# Patient Record
Sex: Male | Born: 1935 | Race: White | Hispanic: No | Smoking: Former smoker
Health system: Southern US, Community
[De-identification: ages and names within clinical notes are randomized; demographics above are authoritative.]

## PROBLEM LIST (undated history)

## (undated) ENCOUNTER — Emergency Department (HOSPITAL_COMMUNITY): Discharge status: Absent without leave | Payer: Medicare Other

## (undated) DIAGNOSIS — E785 Hyperlipidemia, unspecified: Secondary | ICD-10-CM

## (undated) DIAGNOSIS — M069 Rheumatoid arthritis, unspecified: Secondary | ICD-10-CM

## (undated) DIAGNOSIS — I5032 Chronic diastolic (congestive) heart failure: Secondary | ICD-10-CM

## (undated) DIAGNOSIS — K5730 Diverticulosis of large intestine without perforation or abscess without bleeding: Secondary | ICD-10-CM

## (undated) DIAGNOSIS — I872 Venous insufficiency (chronic) (peripheral): Secondary | ICD-10-CM

## (undated) DIAGNOSIS — F3289 Other specified depressive episodes: Secondary | ICD-10-CM

## (undated) DIAGNOSIS — H8110 Benign paroxysmal vertigo, unspecified ear: Secondary | ICD-10-CM

## (undated) DIAGNOSIS — G609 Hereditary and idiopathic neuropathy, unspecified: Secondary | ICD-10-CM

## (undated) DIAGNOSIS — F329 Major depressive disorder, single episode, unspecified: Secondary | ICD-10-CM

## (undated) DIAGNOSIS — J8410 Pulmonary fibrosis, unspecified: Secondary | ICD-10-CM

## (undated) DIAGNOSIS — I10 Essential (primary) hypertension: Secondary | ICD-10-CM

## (undated) DIAGNOSIS — G4730 Sleep apnea, unspecified: Secondary | ICD-10-CM

## (undated) DIAGNOSIS — I4891 Unspecified atrial fibrillation: Secondary | ICD-10-CM

## (undated) DIAGNOSIS — I4892 Unspecified atrial flutter: Secondary | ICD-10-CM

## (undated) DIAGNOSIS — F419 Anxiety disorder, unspecified: Secondary | ICD-10-CM

## (undated) HISTORY — DX: Major depressive disorder, single episode, unspecified: F32.9

## (undated) HISTORY — DX: Essential (primary) hypertension: I10

## (undated) HISTORY — DX: Sleep apnea, unspecified: G47.30

## (undated) HISTORY — PX: VARICOSE VEIN SURGERY: SHX832

## (undated) HISTORY — DX: Unspecified atrial flutter: I48.92

## (undated) HISTORY — DX: Benign paroxysmal vertigo, unspecified ear: H81.10

## (undated) HISTORY — DX: Hereditary and idiopathic neuropathy, unspecified: G60.9

## (undated) HISTORY — DX: Morbid (severe) obesity due to excess calories: E66.01

## (undated) HISTORY — PX: RADIOFREQUENCY ABLATION: SHX2290

## (undated) HISTORY — DX: Chronic diastolic (congestive) heart failure: I50.32

## (undated) HISTORY — PX: COLONOSCOPY W/ BIOPSIES: SHX1374

## (undated) HISTORY — DX: Anxiety disorder, unspecified: F41.9

## (undated) HISTORY — DX: Other specified depressive episodes: F32.89

## (undated) HISTORY — DX: Rheumatoid arthritis, unspecified: M06.9

## (undated) HISTORY — DX: Diverticulosis of large intestine without perforation or abscess without bleeding: K57.30

## (undated) HISTORY — DX: Hyperlipidemia, unspecified: E78.5

## (undated) HISTORY — DX: Venous insufficiency (chronic) (peripheral): I87.2

## (undated) HISTORY — DX: Unspecified atrial fibrillation: I48.91

---

## 1961-01-19 HISTORY — PX: NEPHRECTOMY: SHX65

## 1997-10-08 ENCOUNTER — Encounter: Admission: RE | Admit: 1997-10-08 | Discharge: 1997-10-08 | Payer: Self-pay | Admitting: Hematology and Oncology

## 1998-02-18 ENCOUNTER — Ambulatory Visit (HOSPITAL_COMMUNITY): Admission: RE | Admit: 1998-02-18 | Discharge: 1998-02-18 | Payer: Self-pay | Admitting: Psychiatry

## 1998-04-10 ENCOUNTER — Ambulatory Visit (HOSPITAL_COMMUNITY): Admission: RE | Admit: 1998-04-10 | Discharge: 1998-04-10 | Payer: Self-pay | Admitting: Family Medicine

## 2000-03-09 ENCOUNTER — Emergency Department (HOSPITAL_COMMUNITY): Admission: EM | Admit: 2000-03-09 | Discharge: 2000-03-09 | Payer: Self-pay | Admitting: Emergency Medicine

## 2001-06-20 ENCOUNTER — Encounter: Payer: Self-pay | Admitting: Family Medicine

## 2001-06-20 ENCOUNTER — Encounter: Admission: RE | Admit: 2001-06-20 | Discharge: 2001-06-20 | Payer: Self-pay | Admitting: Family Medicine

## 2006-07-06 ENCOUNTER — Encounter: Admission: RE | Admit: 2006-07-06 | Discharge: 2006-07-06 | Payer: Self-pay | Admitting: Internal Medicine

## 2006-11-02 ENCOUNTER — Encounter: Payer: Self-pay | Admitting: Internal Medicine

## 2008-03-05 ENCOUNTER — Encounter: Payer: Self-pay | Admitting: Internal Medicine

## 2008-03-05 LAB — CONVERTED CEMR LAB
AST: 21 units/L
Albumin: 4 g/dL
Alkaline Phosphatase: 56 units/L
BUN: 14 mg/dL
CO2: 20 meq/L
Calcium: 9.3 mg/dL
Chloride: 104 meq/L

## 2008-03-07 ENCOUNTER — Encounter: Payer: Self-pay | Admitting: Internal Medicine

## 2008-03-07 LAB — CONVERTED CEMR LAB
BUN: 18 mg/dL
CO2: 24 meq/L
Creatinine, Ser: 0.89 mg/dL
Glucose, Bld: 72 mg/dL
Potassium: 5.1 meq/L

## 2008-07-19 ENCOUNTER — Encounter: Admission: RE | Admit: 2008-07-19 | Discharge: 2008-07-19 | Payer: Self-pay | Admitting: Specialist

## 2008-11-20 ENCOUNTER — Encounter: Payer: Self-pay | Admitting: Internal Medicine

## 2008-11-20 LAB — CONVERTED CEMR LAB
ALT: 15 units/L
AST: 11 units/L
CO2: 25 meq/L
Calcium: 0.824 mg/dL
Creatinine, Ser: 0.83 mg/dL
Glucose, Bld: 101 mg/dL
Total Bilirubin: 0.4 mg/dL

## 2008-12-26 ENCOUNTER — Encounter: Payer: Self-pay | Admitting: Internal Medicine

## 2009-04-02 ENCOUNTER — Encounter (INDEPENDENT_AMBULATORY_CARE_PROVIDER_SITE_OTHER): Payer: Self-pay | Admitting: Internal Medicine

## 2009-04-02 ENCOUNTER — Ambulatory Visit: Payer: Self-pay | Admitting: Pulmonary Disease

## 2009-04-02 ENCOUNTER — Inpatient Hospital Stay (HOSPITAL_COMMUNITY): Admission: EM | Admit: 2009-04-02 | Discharge: 2009-04-11 | Payer: Self-pay | Admitting: Emergency Medicine

## 2009-04-02 ENCOUNTER — Ambulatory Visit: Payer: Self-pay | Admitting: Cardiology

## 2009-04-03 ENCOUNTER — Encounter: Payer: Self-pay | Admitting: Internal Medicine

## 2009-04-05 ENCOUNTER — Encounter: Payer: Self-pay | Admitting: Internal Medicine

## 2009-04-07 ENCOUNTER — Encounter: Payer: Self-pay | Admitting: Cardiology

## 2009-04-08 ENCOUNTER — Encounter: Payer: Self-pay | Admitting: Internal Medicine

## 2009-04-08 ENCOUNTER — Encounter: Payer: Self-pay | Admitting: Cardiovascular Disease

## 2009-04-09 LAB — CONVERTED CEMR LAB
AST: 18 units/L
Albumin: 2.8 g/dL
Alkaline Phosphatase: 35 units/L
CO2: 33 meq/L
Calcium: 8.1 mg/dL
Chloride: 102 meq/L
HCT: 33.4 %
Hemoglobin: 11 g/dL
Potassium: 3.7 meq/L
RDW: 16.1 %
TSH: 1.214 microintl units/mL
WBC: 14.3 10*3/uL

## 2009-04-23 ENCOUNTER — Telehealth (INDEPENDENT_AMBULATORY_CARE_PROVIDER_SITE_OTHER): Payer: Self-pay | Admitting: *Deleted

## 2009-05-01 ENCOUNTER — Ambulatory Visit: Payer: Self-pay | Admitting: Internal Medicine

## 2009-05-01 DIAGNOSIS — J84112 Idiopathic pulmonary fibrosis: Secondary | ICD-10-CM

## 2009-05-01 DIAGNOSIS — G4730 Sleep apnea, unspecified: Secondary | ICD-10-CM

## 2009-05-06 ENCOUNTER — Encounter: Payer: Self-pay | Admitting: Internal Medicine

## 2009-05-13 ENCOUNTER — Telehealth (INDEPENDENT_AMBULATORY_CARE_PROVIDER_SITE_OTHER): Payer: Self-pay | Admitting: *Deleted

## 2009-05-14 ENCOUNTER — Ambulatory Visit: Payer: Self-pay | Admitting: Internal Medicine

## 2009-05-30 ENCOUNTER — Telehealth: Payer: Self-pay | Admitting: Internal Medicine

## 2009-06-01 ENCOUNTER — Encounter: Payer: Self-pay | Admitting: Pulmonary Disease

## 2009-06-06 ENCOUNTER — Encounter: Payer: Self-pay | Admitting: Internal Medicine

## 2009-07-11 ENCOUNTER — Encounter: Payer: Self-pay | Admitting: Internal Medicine

## 2009-07-12 ENCOUNTER — Ambulatory Visit: Payer: Self-pay | Admitting: Internal Medicine

## 2009-07-17 ENCOUNTER — Ambulatory Visit: Payer: Self-pay | Admitting: Internal Medicine

## 2009-07-17 DIAGNOSIS — F3289 Other specified depressive episodes: Secondary | ICD-10-CM | POA: Insufficient documentation

## 2009-07-17 DIAGNOSIS — G609 Hereditary and idiopathic neuropathy, unspecified: Secondary | ICD-10-CM

## 2009-07-17 DIAGNOSIS — I872 Venous insufficiency (chronic) (peripheral): Secondary | ICD-10-CM | POA: Insufficient documentation

## 2009-07-17 DIAGNOSIS — F329 Major depressive disorder, single episode, unspecified: Secondary | ICD-10-CM

## 2009-07-17 DIAGNOSIS — M069 Rheumatoid arthritis, unspecified: Secondary | ICD-10-CM

## 2009-07-17 DIAGNOSIS — I10 Essential (primary) hypertension: Secondary | ICD-10-CM | POA: Insufficient documentation

## 2009-07-17 DIAGNOSIS — E785 Hyperlipidemia, unspecified: Secondary | ICD-10-CM

## 2009-07-17 DIAGNOSIS — I4892 Unspecified atrial flutter: Secondary | ICD-10-CM

## 2009-07-17 DIAGNOSIS — I5022 Chronic systolic (congestive) heart failure: Secondary | ICD-10-CM

## 2009-07-17 DIAGNOSIS — J309 Allergic rhinitis, unspecified: Secondary | ICD-10-CM | POA: Insufficient documentation

## 2009-07-18 ENCOUNTER — Encounter: Payer: Self-pay | Admitting: Internal Medicine

## 2009-07-23 ENCOUNTER — Telehealth: Payer: Self-pay | Admitting: Internal Medicine

## 2009-07-23 ENCOUNTER — Ambulatory Visit: Payer: Self-pay | Admitting: Internal Medicine

## 2009-07-25 ENCOUNTER — Encounter: Payer: Self-pay | Admitting: Internal Medicine

## 2009-07-31 ENCOUNTER — Encounter (INDEPENDENT_AMBULATORY_CARE_PROVIDER_SITE_OTHER): Payer: Self-pay | Admitting: *Deleted

## 2009-07-31 ENCOUNTER — Telehealth: Payer: Self-pay | Admitting: Internal Medicine

## 2009-07-31 ENCOUNTER — Encounter: Payer: Self-pay | Admitting: Internal Medicine

## 2009-08-07 ENCOUNTER — Encounter: Payer: Self-pay | Admitting: Internal Medicine

## 2009-08-12 ENCOUNTER — Ambulatory Visit: Payer: Self-pay | Admitting: Pulmonary Disease

## 2009-08-15 ENCOUNTER — Encounter: Payer: Self-pay | Admitting: Internal Medicine

## 2009-08-15 ENCOUNTER — Telehealth (INDEPENDENT_AMBULATORY_CARE_PROVIDER_SITE_OTHER): Payer: Self-pay | Admitting: *Deleted

## 2009-08-16 ENCOUNTER — Ambulatory Visit: Payer: Self-pay | Admitting: Internal Medicine

## 2009-09-06 ENCOUNTER — Encounter (INDEPENDENT_AMBULATORY_CARE_PROVIDER_SITE_OTHER): Payer: Self-pay | Admitting: *Deleted

## 2009-09-06 ENCOUNTER — Ambulatory Visit: Payer: Self-pay | Admitting: Gastroenterology

## 2009-09-06 DIAGNOSIS — R197 Diarrhea, unspecified: Secondary | ICD-10-CM

## 2009-09-06 LAB — CONVERTED CEMR LAB
AST: 16 units/L (ref 0–37)
Albumin: 3.5 g/dL (ref 3.5–5.2)
Basophils Absolute: 0.1 10*3/uL (ref 0.0–0.1)
Creatinine, Ser: 0.8 mg/dL (ref 0.4–1.5)
Eosinophils Relative: 6 % — ABNORMAL HIGH (ref 0.0–5.0)
Ferritin: 261.3 ng/mL (ref 22.0–322.0)
Folate: >20 ng/mL
GFR calc non Af Amer: 94.46 mL/min (ref >60)
Glucose, Bld: 97 mg/dL (ref 70–99)
Iron: 68 ug/dL (ref 42–165)
MCHC: 33.7 g/dL (ref 30.0–36.0)
MCV: 97.3 fL (ref 78.0–100.0)
Monocytes Absolute: 0.9 10*3/uL (ref 0.1–1.0)
Platelets: 311 10*3/uL (ref 150.0–400.0)
RBC: 3.85 M/uL — ABNORMAL LOW (ref 4.22–5.81)
RDW: 14.7 % — ABNORMAL HIGH (ref 11.5–14.6)
Sodium: 141 meq/L (ref 135–145)
Total Bilirubin: 0.4 mg/dL (ref 0.3–1.2)
Vitamin B-12: 554 pg/mL (ref 211–911)

## 2009-09-10 ENCOUNTER — Encounter: Payer: Self-pay | Admitting: Pulmonary Disease

## 2009-09-10 ENCOUNTER — Ambulatory Visit (HOSPITAL_BASED_OUTPATIENT_CLINIC_OR_DEPARTMENT_OTHER): Admission: RE | Admit: 2009-09-10 | Discharge: 2009-09-10 | Payer: Self-pay | Admitting: Pulmonary Disease

## 2009-09-11 ENCOUNTER — Encounter: Payer: Self-pay | Admitting: Internal Medicine

## 2009-09-13 ENCOUNTER — Telehealth: Payer: Self-pay | Admitting: Gastroenterology

## 2009-09-16 ENCOUNTER — Ambulatory Visit: Payer: Self-pay | Admitting: Gastroenterology

## 2009-09-16 LAB — HM COLONOSCOPY

## 2009-09-17 ENCOUNTER — Encounter: Payer: Self-pay | Admitting: Gastroenterology

## 2009-09-18 ENCOUNTER — Encounter: Payer: Self-pay | Admitting: Gastroenterology

## 2009-09-18 ENCOUNTER — Ambulatory Visit: Payer: Self-pay | Admitting: Cardiology

## 2009-09-19 ENCOUNTER — Telehealth: Payer: Self-pay | Admitting: Internal Medicine

## 2009-09-19 ENCOUNTER — Telehealth: Payer: Self-pay | Admitting: Pulmonary Disease

## 2009-09-19 HISTORY — PX: SHOULDER SURGERY: SHX246

## 2009-09-20 ENCOUNTER — Encounter (INDEPENDENT_AMBULATORY_CARE_PROVIDER_SITE_OTHER): Payer: Self-pay | Admitting: Specialist

## 2009-09-20 ENCOUNTER — Observation Stay (HOSPITAL_COMMUNITY): Admission: RE | Admit: 2009-09-20 | Discharge: 2009-09-21 | Payer: Self-pay | Admitting: Specialist

## 2009-09-21 ENCOUNTER — Encounter (INDEPENDENT_AMBULATORY_CARE_PROVIDER_SITE_OTHER): Payer: Self-pay | Admitting: Specialist

## 2009-09-30 ENCOUNTER — Ambulatory Visit: Payer: Self-pay | Admitting: Pulmonary Disease

## 2009-10-02 ENCOUNTER — Encounter: Payer: Self-pay | Admitting: Pulmonary Disease

## 2009-10-08 ENCOUNTER — Encounter: Payer: Self-pay | Admitting: Internal Medicine

## 2009-10-08 ENCOUNTER — Ambulatory Visit: Payer: Self-pay | Admitting: Cardiology

## 2009-10-28 ENCOUNTER — Encounter: Payer: Self-pay | Admitting: Pulmonary Disease

## 2009-11-04 ENCOUNTER — Encounter: Payer: Self-pay | Admitting: Pulmonary Disease

## 2009-11-04 ENCOUNTER — Telehealth: Payer: Self-pay | Admitting: Pulmonary Disease

## 2009-11-06 ENCOUNTER — Encounter: Payer: Self-pay | Admitting: Pulmonary Disease

## 2009-11-07 ENCOUNTER — Encounter: Payer: Self-pay | Admitting: Pulmonary Disease

## 2009-11-11 ENCOUNTER — Ambulatory Visit: Payer: Self-pay | Admitting: Pulmonary Disease

## 2009-11-12 ENCOUNTER — Encounter: Payer: Self-pay | Admitting: Pulmonary Disease

## 2009-11-15 ENCOUNTER — Telehealth: Payer: Self-pay | Admitting: Pulmonary Disease

## 2009-12-03 ENCOUNTER — Ambulatory Visit: Payer: Self-pay | Admitting: Cardiology

## 2009-12-03 ENCOUNTER — Encounter: Payer: Self-pay | Admitting: Internal Medicine

## 2009-12-24 ENCOUNTER — Ambulatory Visit: Payer: Self-pay | Admitting: Cardiology

## 2009-12-30 ENCOUNTER — Ambulatory Visit: Payer: Self-pay | Admitting: Internal Medicine

## 2009-12-30 DIAGNOSIS — H8110 Benign paroxysmal vertigo, unspecified ear: Secondary | ICD-10-CM

## 2009-12-30 DIAGNOSIS — R7309 Other abnormal glucose: Secondary | ICD-10-CM

## 2009-12-31 LAB — CONVERTED CEMR LAB: Hgb A1c MFr Bld: 5.8 % (ref 4.6–6.5)

## 2010-01-08 ENCOUNTER — Encounter: Payer: Self-pay | Admitting: Internal Medicine

## 2010-01-28 ENCOUNTER — Telehealth: Payer: Self-pay | Admitting: Internal Medicine

## 2010-02-03 ENCOUNTER — Ambulatory Visit
Admission: RE | Admit: 2010-02-03 | Discharge: 2010-02-03 | Payer: Self-pay | Source: Home / Self Care | Attending: Internal Medicine | Admitting: Internal Medicine

## 2010-02-03 DIAGNOSIS — M79609 Pain in unspecified limb: Secondary | ICD-10-CM | POA: Insufficient documentation

## 2010-02-04 ENCOUNTER — Telehealth: Payer: Self-pay | Admitting: Internal Medicine

## 2010-02-05 ENCOUNTER — Telehealth (INDEPENDENT_AMBULATORY_CARE_PROVIDER_SITE_OTHER): Payer: Self-pay | Admitting: *Deleted

## 2010-02-19 ENCOUNTER — Encounter: Payer: Self-pay | Admitting: Internal Medicine

## 2010-02-20 NOTE — Progress Notes (Signed)
Summary: REFILL ON VIT B12  Phone Note Call from Patient Call back at Home Phone [phone]   Caller: [facility] Summary of Call: PT WANTS A REFILL ON VIT B12.  PRESCRIBED BY DR SPEAR TO HELP WITH HIS NEROPATHY.  HIS PHARMACY IS [facility] [place].  HIS PHONE NUMBER IS [phone] Initial call taken by: Hilarie Fredrickson,  July 23, 2009 3:49 PM  Follow-up for Phone Call        Okay to refill? Follow-up by: Margaret Pyle, CMA,  July 23, 2009 3:55 PM  Additional Follow-up for Phone Call Additional follow up Details #1::        ok Additional Follow-up by: Newt Lukes MD,  July 23, 2009 4:01 PM    Additional Follow-up for Phone Call Additional follow up Details #2::    Pt informed Follow-up by: Margaret Pyle, CMA,  July 23, 2009 4:10 PM  Prescriptions: VITAMIN B-12 500 MCG TABS (CYANOCOBALAMIN) Take 1 tablet by mouth once a day  #30 x 5   Entered by:   Margaret Pyle, CMA   Authorized by:   Newt Lukes MD   Signed by:   Margaret Pyle, CMA on 07/23/2009   Method used:   [facility]ronically to        [facility] [phone]* (retail)       [address]       [place], [place]  [address]       Ph: [phone] or [phone]       Fax: [phone]   RxID:   [id number]

## 2010-02-20 NOTE — Assessment & Plan Note (Signed)
Summary: sleep apnea/apc   Visit Type:  Initial Consult Primary Provider/Referring Provider:  Newt Lukes MD - PMD. Dr Marchelle Gearing - Pulmonary, Dr Azzie Roup - Rheum, Dr [name] - Cards, Dr. [name] - sleep  CC:  Sleep consult.  History of Present Illness: OV 05/01/2009: 77 year old obese male with Pulmonary Fibrosis,  OSA, RA on leflunomide/pred 10mg  and OSA per hx (poor compliance with CPAP), CHF with poor EF. Was admited mid-March 2011 with A Flutter/Fib and CHF exacerbation and needed ICU stay and amiodarone. CT showed evidence of pulmonary fibrosis. Pulmonary advised cautiin with amio. Thereofre, he is s/p cardioversion and ablation with retun to sinus. Cath 04/06/2009 was normal coronaries. PFTs nml  August 12, 2009 Sleep evaluation He underwent PSG in 2004 & was told that he had severe obstructive sleep apnea . He bought the CPAP from a friend who was not using it. The settings were never changed to his needs ! he has since lost wt from 2655 to 235 lbs. Epworth Sleepiness Score remains 15/24. bedtime is 9p, latency 10 mins, afternon naps +, wakes up 3-4 times to urinate , wakes up at 7A feelig tired. There is no history suggestive of cataplexy, sleep paralysis or parasomnias    Preventive Screening-Counseling & Management  Alcohol-Tobacco     Packs/Day: 0.25     Year Started: 1972     Year Quit: 1975   History of Present Illness: In regard to lots of sleep, still tired  What time do you typically go to bed?(between what hours): 9-10  How long does it take you to fall asleep? 10 mins  How many times during the night do you wake up? to urinate average 3-4  What time do you get out of bed to start your day? average 6 to 7  Do you drive or operate heavy machinery in your occupation? no  How much has your weight changed (up or down) over the past two years? (in pounds): 20 lbs decrease  Have you ever had a sleep study before?  If yes,when and where: yes In [place]  approx 6-7 years ago [facility] on [address])  Do you currently use CPAP ? If so , at what pressure? yes  Do you wear oxygen at any time? If yes, how many liters per minute? no Current Medications (verified): 1)  Multivitamins  Tabs (Multiple Vitamin) .... Take 1 Tablet By Mouth Once A Day 2)  Vitamin C Cr 500 Mg Cr-Tabs (Ascorbic Acid) .... Take 1 Tablet By Mouth Once A Day 3)  Lycopene 10 Mg Caps (Lycopene) .... Take 1 Tablet By Mouth Once A Day 4)  Chromium Picolinate 500 Mcg Tabs (Chromium Picolinate) .... Take 1 Tablet By Mouth Once A Day 5)  Calcium 500 Mg Tabs (Calcium) .... Take 1 Tablet By Mouth Two Times A Day 6)  Urinozinc Prostate Formula .... Take 1 Tablet By Mouth Once A Day 7)  Folic Acid 800 Mcg Tabs (Folic Acid) .... Take 1 Tablet By Mouth Once A Day 8)  Vitamin B-12 500 Mcg Tabs (Cyanocobalamin) .... Take 1 Tablet By Mouth Once A Day 9)  Aspir-Low 81 Mg Tbec (Aspirin) .... Take 1 Tablet By Mouth Once A Day 10)  Furosemide 40 Mg Tabs (Furosemide) .... Take 1 Tablet By Mouth Once A Day 11)  Diltiazem Hcl Er Beads 180 Mg Xr24h-Cap (Diltiazem Hcl Er Beads) .... Take 1 Tablet By Mouth Once A Day 12)  Digoxin 0.25 Mg Tabs (Digoxin) .... Take  1 Tablet By Mouth Once A Day 13)  Arava 10 Mg Tabs (Leflunomide) .... At Bedtime 14)  Prilosec 20 Mg Cpdr (Omeprazole) .... Take 1 Tablet By Mouth Once A Day 15)  Metoprolol Tartrate 25 Mg Tabs (Metoprolol Tartrate) .... 1/2 Tab By Mouth Two Times A Day 16)  Gabapentin 300 Mg Caps (Gabapentin) .... 2 Caps By Mouth At Bedtime 17)  Cpap .... Wear At Bedtime 18)  Mucinex Dm 30-600 Mg Xr12h-Tab (Dextromethorphan-Guaifenesin) .... Take 1-2 Tablets Every 12 Hours As Needed 19)  Gochi Juice .... Drink 1 Oz Once Daily 20)  Thigh High Compression Hose .Marland Kitchen.. 1 Pair - Apply Am, Remove At Bedtime Dx - Venous Insufficiency  Allergies (verified): 1)  ! Methotrexate  Past History:  Past Medical History: Last updated: 07/19/2009 Atrial flutter w/ RVR  03/2009 hosp,  s/p ablation = NSR chronic systolic CHF interstitial lung disease. rheumatoid arthritis. History of morbid obesity. 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      Family History: Last updated: 07-19-09 Mom was deceased at the age of 62, nerve  problems.  Father was deceased at the age of 33 - myocardial infarction  Social History: Last updated: 2009/07/19 single, lives alone He does not smoke.   He said he used to smoke socially only when he drank alcohol.   He currently denies drinking alcohol.   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  His family contact would be his sister, [name], [phone].   Social History: Packs/Day:  0.25  Review of Systems       The patient complains of shortness of breath with activity, productive cough, indigestion, nasal congestion/difficulty breathing through nose, sneezing, itching, hand/feet swelling, joint stiffness or pain, rash, and change in color of mucus.  The patient denies shortness of breath at rest, non-productive cough, coughing up blood, chest pain, irregular heartbeats, acid heartburn, loss of appetite, weight change, abdominal pain, difficulty swallowing, sore throat, tooth/dental problems, headaches, ear ache, anxiety, depression, and fever.    Vital Signs:  Patient profile:   77 year old male Height:      68 inches Weight:      236 pounds BMI:     36.01 O2 Sat:      95 % on Room air Temp:     98.6 degrees F oral Pulse rate:   80 / minute BP sitting:   100 / 60  (left arm) Cuff size:   regular  Vitals Entered By: Zackery Barefoot CMA (August 12, 2009 2:13 PM)  O2 Flow:  Room air CC: Sleep  consult Comments Medications reviewed with patient Verified contact number and pharmacy with patient Zackery Barefoot CMA  August 12, 2009 2:13 PM    Physical Exam  Additional Exam:  Gen. Pleasant, well-nourished, in no distress, normal affect ENT - no lesions, no post nasal drip, class 2 airway Neck: No JVD, no thyromegaly, no carotid bruits Lungs: no use of accessory muscles, no dullness to percussion, clear without rales or rhonchi  Cardiovascular: Rhythm regular, heart sounds  normal, no murmurs or gallops, no peripheral edema Abdomen: soft and non-tender, no hepatosplenomegaly, BS normal. Musculoskeletal: No deformities, no cyanosis or clubbing Neuro:  alert, non focal  Impression & Recommendations:  Problem # 1:  OBSTRUCTIVE SLEEP APNEA (ICD-780.57) The pathophysiology of obstructive sleep apnea, it's cardiovascular consequences and modes of treatment including CPAP were discussed with the patient in great detail.  We will have [facility] review the settings on his machine & provide him with supplies. Given his weight loss, we will proceed with a split study & adjust his cPAP as needed Compliance encouraged, wt loss emphasized, asked to avoid meds with sedative side effects, cautioned against driving when sleepy.  Orders: Est. Patient Level V (86578) Sleep Disorder Referral (Sleep Disorder)  Patient Instructions: 1)  Copy sent to:Dr Leschber 2)  Please schedule a follow-up appointment in 2 weeks after sleep study    Appended Document: sleep apnea/apc cppa 9 cm with med FF mask required on PSG  ? add O2 for persistent REM related desaturation

## 2010-02-20 NOTE — Procedures (Signed)
Summary: Oximetry and Order/[facility]  Oximetry and Order/[facility]   Imported By: Lester [place] 11/21/2009 09:55:44  _____________________________________________________________________  External Attachment:    Type:   Image     Comment:   External Document

## 2010-02-20 NOTE — Progress Notes (Signed)
Summary: Rx req  Phone Note Call from Patient Call back at Home Phone [phone]   Caller: Patient Summary of Call: Pt called requesting Rx for knee-hi expansion hose to [facility]. Initial call taken by: Margaret Pyle, CMA,  January 28, 2010 4:11 PM  Follow-up for Phone Call        ok to generate and i will sign Follow-up by: Newt Lukes MD,  January 28, 2010 4:46 PM    New/Updated Medications: * KNEE-HI EXPANSION HOSE use as directed Prescriptions: KNEE-HI EXPANSION HOSE use as directed  #1 x 0   Entered by:   Margaret Pyle, CMA   Authorized by:   Newt Lukes MD   Signed by:   Margaret Pyle, CMA on 01/29/2010   Method used:   Printed then faxed to ...       [facility]* (retail)       [address]       [place], [place]  [address]       Ph: [phone]       Fax: [phone]   RxID:   [id number]

## 2010-02-20 NOTE — Progress Notes (Signed)
Summary: Ortho referral  Phone Note Call from Patient   Summary of Call: Dr Felicity Coyer, pt was scheduled to see Dr Melvyn Novas 02/06/10 @ 815am, pt said he had just started taking medication and hand was feeling bette. Swelling was going down, he would like to see what the medicine will do.If he need to he will call back for referral. Initial call taken by: Dagoberto Reef,  February 05, 2010 11:27 AM  Follow-up for Phone Call        i would still encourage pt to keep appt as referred for second opinion, but pt desicion noted - thanks Follow-up by: Newt Lukes MD,  February 05, 2010 12:18 PM

## 2010-02-20 NOTE — Procedures (Signed)
Summary: Colonoscopy  Patient: [name] Note: All result statuses are Final unless otherwise noted.  Tests: (1) Colonoscopy (COL)   COL Colonoscopy           DONE     [facility]     [address]     [place], [place]  [address]           COLONOSCOPY PROCEDURE REPORT           PATIENT:  [name], [name]  MR#:  [id number]     BIRTHDATE:  [birth year], 77 yrs. old  GENDER:  male     ENDOSCOPIST:  Vania Rea. Jarold Motto, MD, [facility]     REF. BY:  [name], M.D.     PROCEDURE DATE:  09/16/2009     PROCEDURE:  Colonoscopy with biopsy, Colonoscopy with snare     polypectomy     ASA CLASS:  Class II     INDICATIONS:  Routine Risk Screening, unexplained diarrhea     MEDICATIONS:   Fentanyl 75 mcg IV, Versed 6 mg IV           DESCRIPTION OF PROCEDURE:   After the risks benefits and     alternatives of the procedure were thoroughly explained, informed     consent was obtained.  Digital rectal exam was performed and     revealed no abnormalities.   The LB [id number] endoscope     was introduced through the anus and advanced to the terminal ileum     which was intubated for a short distance, without limitations.     The quality of the prep was excellent, using MoviPrep.  The     instrument was then slowly withdrawn as the colon was fully     examined.     <<PROCEDUREIMAGES>>     FINDINGS:  Mild diverticulosis was found in the sigmoid to     descending colon segments.  A sessile polyp was found. 5mm flat     polyp cold snare excise from ascending colon.  This was otherwise     a normal examination of the colon. random biopsies done.   Retrof     lexed views in the rectum revealed no abnormalities.    The scope     was then withdrawn from the patient and the procedure completed.           COMPLICATIONS:  None     ENDOSCOPIC IMPRESSION:     1) Mild diverticulosis in the sigmoid to descending colon     segments     2) Sessile polyp     3) Otherwise normal examination     R/O MICROSCOPIC/COLLAGENOUS COLITIS.R/O ADENOMA.     RECOMMENDATIONS:     1) Await pathology results     2) Continue current medications     REPEAT EXAM:  No           ______________________________     Vania Rea. Jarold Motto, MD, [facility]           CC:           n.     eSIGNED:   Vania Rea. Patterson at 09/16/2009 03:16 PM           [name], [name], [id number]  Note: An exclamation mark (!) indicates a result that was not dispersed into the flowsheet. Document Creation Date: 09/16/2009 3:18 PM _______________________________________________________________________  (1) Order result status: Final Collection or observation date-time: 09/16/2009 15:10 Requested date-time:  Receipt date-time:  Reported date-time:  Referring Physician:   Ordering Physician: Sheryn Bison [phone]) Specimen Source:  Source: Launa Grill Order Number: [id number] Lab site:

## 2010-02-20 NOTE — Miscellaneous (Signed)
Summary: Orders Update pft charges  Clinical Lists Changes  Orders: Added new Service order of Carbon Monoxide diffusing w/capacity (94720) - Signed Added new Service order of Lung Volumes (94240) - Signed Added new Service order of Spirometry (Pre & Post) (94060) - Signed 

## 2010-02-20 NOTE — Assessment & Plan Note (Signed)
Summary: elev sugar,lightheaded/cd   Vital Signs:  Patient profile:   77 year old male Height:      68 inches (172.72 cm) Weight:      235.4 pounds (107 kg) O2 Sat:      98 % on Room air Temp:     97.9 degrees F (36.61 degrees C) oral Pulse rate:   83 / minute BP sitting:   126 / 72  (left arm) Cuff size:   large  Vitals Entered By: Orlan Leavens RMA (December 30, 2009 2:43 PM)  O2 Flow:  Room air CC: Elevated BS & lighthead Is Patient Diabetic? Yes Did you bring your meter with you today? No Pain Assessment Patient in pain? no        Primary Care Provider:  Newt Lukes MD - PMD. Dr Marchelle Gearing - Pulmonary, Dr Azzie Roup - Rheum, Dr [name] - Cards, Dr. [name] - sleep  CC:  Elevated BS & lighthead.  History of Present Illness: here for f/u  1) pulm fibrosis - idopathic and ?amio component - follows with pulm for same - deneis SOB or DOE at this time - reports compliance with meds - no O2 needs  2) A fib/flutter - hosp with same and exac CHF 03/2009 - s/p cardioversion and ablastion 03/2009 = sinus maintained, off amio due to above - denies palp or edema changes - compliants with meds as rx'd and follows with dr. [place] - initially on pradaxa but no anticoag rec at this time  3) RA - follows with rheum for same - on arava since start of 2011, denies adv effects from same, pain and mobility much improved -   4) OSA - intermittent CPAP use - believes weight loss which has occured since 03/2009 hosp has helped his OSA symptoms so CPAP "not necessary most of the time"  5) chronic neuropathic pain - burning and numbness affects hands and feet, no recent change in symptoms - controlled with gabapentin - needs refill  also concern with elev glc at dr. [place] - random 130s - hx borderline sugar in past no recent steroids for arthritis (RA) or pulm flares overindulg in sweets at night and snacks  also c/o dizziness onset 2 weeks ago - no /ha or ear pain - no tinnitus but  ear fullness x 1 1 week ago worse with any head mvmt, better with still position no hx same, no head trauma or vision change, no fever  Clinical Review Panels:  Diabetes Management   HgBA1C:  5.4 (04/09/2009)   Creatinine:  0.8 (09/06/2009)   Last Pneumovax:  Pneumovax (02/19/2006)  CBC   WBC:  9.2 (09/06/2009)   RBC:  3.85 (09/06/2009)   Hgb:  12.6 (09/06/2009)   Hct:  37.4 (09/06/2009)   Platelets:  311.0 (09/06/2009)   MCV  97.3 (09/06/2009)   MCHC  33.7 (09/06/2009)   RDW  14.7 (09/06/2009)   PMN:  68.9 (09/06/2009)   Lymphs:  15.0 (09/06/2009)   Monos:  9.3 (09/06/2009)   Eosinophils:  6.0 (09/06/2009)   Basophil:  0.8 (09/06/2009)  Complete Metabolic Panel   Glucose:  97 (09/06/2009)   Sodium:  141 (09/06/2009)   Potassium:  4.7 (09/06/2009)   Chloride:  103 (09/06/2009)   CO2:  30 (09/06/2009)   BUN:  16 (09/06/2009)   Creatinine:  0.8 (09/06/2009)   Albumin:  3.5 (09/06/2009)   Total Protein:  6.3 (09/06/2009)   Calcium:  9.1 (09/06/2009)   Total Bili:  0.4 (  09/06/2009)   Alk Phos:  54 (09/06/2009)   SGPT (ALT):  13 (09/06/2009)   SGOT (AST):  16 (09/06/2009)   Current Medications (verified): 1)  Multivitamins  Tabs (Multiple Vitamin) .... Take 1 Tablet By Mouth Once A Day 2)  Vitamin C Cr 500 Mg Cr-Tabs (Ascorbic Acid) .... Take 1 Tablet By Mouth Once A Day 3)  Lycopene 10 Mg Caps (Lycopene) .... Take 1 Tablet By Mouth Once A Day 4)  Chromium Picolinate 500 Mcg Tabs (Chromium Picolinate) .... Take 1 Tablet By Mouth Once A Day 5)  Calcium 500 Mg Tabs (Calcium) .... Take 1 Tablet By Mouth Two Times A Day 6)  Urinozinc Prostate Formula .... Take 1 Tablet By Mouth Once A Day 7)  Folic Acid 800 Mcg Tabs (Folic Acid) .... Take 1 Tablet By Mouth Once A Day 8)  Vitamin B-12 500 Mcg Tabs (Cyanocobalamin) .... Take 1 Tablet By Mouth Once A Day 9)  Aspir-Low 81 Mg Tbec (Aspirin) .... Take 1 Tablet By Mouth Once A Day 10)  Furosemide 40 Mg Tabs (Furosemide) .... Take  1 Tablet By Mouth Once A Day 11)  Diltiazem Hcl Er Beads 180 Mg Xr24h-Cap (Diltiazem Hcl Er Beads) .... Take 1 Tablet By Mouth Once A Day 12)  Digoxin 0.25 Mg Tabs (Digoxin) .... Take 1 Tablet By Mouth Once A Day 13)  Arava 10 Mg Tabs (Leflunomide) .... At Bedtime 14)  Metoprolol Tartrate 25 Mg Tabs (Metoprolol Tartrate) .... 1/2 Tab By Mouth Two Times A Day 15)  Gabapentin 300 Mg Caps (Gabapentin) .... 2 Caps By Mouth At Bedtime 16)  Cpap .... Wear At Bedtime 17)  Mucinex Dm 30-600 Mg Xr12h-Tab (Dextromethorphan-Guaifenesin) .... Take 1-2 Tablets Every 12 Hours As Needed 18)  Gochi Juice .... Drink 1 Oz Once Daily 19)  Thigh High Compression Hose .Marland Kitchen.. 1 Pair - Apply Am, Remove At Bedtime Dx - Venous Insufficiency 20)  Allergy Relief 25 Mg Caps (Diphenhydramine Hcl) .... One Capsule By Mouth As Needed  Allergies (verified): 1)  ! Methotrexate  Past History:  Past Medical History: Atrial flutter w/ RVR 03/2009 hosp,  s/p ablation = NSR chronic systolic CHF interstitial lung disease rheumatoid arthritis History of morbid obesity neuropathic pain chronic left lower extremity edema Allergic rhinitis Depression Hyperlipidemia Hypertension  MD roster: pulm - ramaswamy cards- [place]  rheum Dareen Piano   Family History: Mom was deceased at the age of 40, nerve  problems Father was deceased at the age of 73 - myocardial infarction  Social History: single, lives alone He does not smoke, used to smoke socially when he drank alcohol   He currently denies drinking alcohol.   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  His family contact would be his sister, [name], [phone].   Review of Systems  The patient denies weight loss, chest pain, syncope, headaches, and abdominal pain.    Physical Exam  General:  alert, well-developed, well-nourished, and cooperative to examination.    Lungs:  normal respiratory effort, no intercostal retractions or use of  accessory muscles; normal breath sounds bilaterally - no crackles and no wheezes.    Heart:  normal rate, regular rhythm, no murmur, and no rub. RLE without edema, LLE with chronic 1+ edema (wearing compression hose for same). normal DP pulses and normal cap refill in all 4 extremities    Neurologic:  alert & oriented X3 and cranial nerves II-XII symetrically intact.  strength normal in all  extremities, sensation intact to light touch, and gait normal. speech fluent without dysarthria or aphasia; follows commands with good comprehension.  Psych:  Oriented X3, memory intact for recent and remote, normally interactive, good eye contact, not anxious appearing, not depressed appearing, and not agitated.      Impression & Recommendations:  Problem # 1:  HYPERGLYCEMIA (ICD-790.29)  hx MO s/p weight loss in 2011 - now random hyperglycemia on labs at cards but not dx of DM - check a1c not educated on need for diet changes, less sweets - offered pt to check cng 2-3x/week with home glucometer and pt declines at this time - will insist on same if elev a1c Orders: TLB-A1C / Hgb A1C (Glycohemoglobin) (83036-A1C)  Labs Reviewed: Creat: 0.8 (09/06/2009)     Problem # 2:  BENIGN POSITIONAL VERTIGO (ICD-386.11)  exam benign, hx c/w BPPV trial meclizine His updated medication list for this problem includes:    Allergy Relief 25 Mg Caps (Diphenhydramine hcl) ..... One capsule by mouth as needed    Meclizine Hcl 25 Mg Tabs (Meclizine hcl) .Marland Kitchen... 1/2-1 by mouth every 4 hours as needed for dizzy symptoms  Demonstrated maneuvers to self-treat vertigo. Patient to call to be seen if no improvement in 10-14 days, sooner if worse.   Orders: Prescription Created Electronically [phone])  Problem # 3:  VENOUS INSUFFICIENCY, LEFT LEG (ICD-459.81)  Problem # 4:  RHEUMATOID ARTHRITIS (ICD-714.0)  Examination shows rheumatoid deformity of his hands but no active swollen arthritic joints. He denies NSAID use at  this time.  Problem # 5:  ATRIAL FLUTTER (ICD-427.32) s/p RVR 03/2009 His updated medication list for this problem includes:    Aspir-low 81 Mg Tbec (Aspirin) .Marland Kitchen... Take 1 tablet by mouth once a day    Diltiazem Hcl Er Beads 180 Mg Xr24h-cap (Diltiazem hcl er beads) .Marland Kitchen... Take 1 tablet by mouth once a day    Digoxin 0.25 Mg Tabs (Digoxin) .Marland Kitchen... Take 1 tablet by mouth once a day    Metoprolol Tartrate 25 Mg Tabs (Metoprolol tartrate) .Marland Kitchen... 1/2 tab by mouth two times a day  Problem # 6:  PULMONARY FIBROSIS, INTERSTITIAL (ICD-516.3) stable - folllow with pulm q 6 mo as ongoing  Complete Medication List: 1)  Multivitamins Tabs (Multiple vitamin) .... Take 1 tablet by mouth once a day 2)  Vitamin C Cr 500 Mg Cr-tabs (Ascorbic acid) .... Take 1 tablet by mouth once a day 3)  Lycopene 10 Mg Caps (Lycopene) .... Take 1 tablet by mouth once a day 4)  Chromium Picolinate 500 Mcg Tabs (Chromium picolinate) .... Take 1 tablet by mouth once a day 5)  Calcium 500 Mg Tabs (Calcium) .... Take 1 tablet by mouth two times a day 6)  Urinozinc Prostate Formula  .... Take 1 tablet by mouth once a day 7)  Folic Acid 800 Mcg Tabs (Folic acid) .... Take 1 tablet by mouth once a day 8)  Vitamin B-12 500 Mcg Tabs (Cyanocobalamin) .... Take 1 tablet by mouth once a day 9)  Aspir-low 81 Mg Tbec (Aspirin) .... Take 1 tablet by mouth once a day 10)  Furosemide 40 Mg Tabs (Furosemide) .... Take 1 tablet by mouth once a day 11)  Diltiazem Hcl Er Beads 180 Mg Xr24h-cap (Diltiazem hcl er beads) .... Take 1 tablet by mouth once a day 12)  Digoxin 0.25 Mg Tabs (Digoxin) .... Take 1 tablet by mouth once a day 13)  Arava 10 Mg Tabs (Leflunomide) .... At bedtime 14)  Metoprolol Tartrate 25 Mg Tabs (Metoprolol tartrate) .... 1/2 tab by mouth two times a day 15)  Gabapentin 300 Mg Caps (Gabapentin) .... 2 caps by mouth at bedtime 16)  Cpap  .... Wear at bedtime 17)  Mucinex Dm 30-600 Mg Xr12h-tab (Dextromethorphan-guaifenesin)  .... Take 1-2 tablets every 12 hours as needed 18)  Gochi Juice  .... Drink 1 oz once daily 19)  Thigh High Compression Hose  .Marland Kitchen.. 1 pair - apply am, remove at bedtime dx - venous insufficiency 20)  Allergy Relief 25 Mg Caps (Diphenhydramine hcl) .... One capsule by mouth as needed 21)  Flonase 50 Mcg/act Susp (Fluticasone propionate) .... 2 sprays each nostril every morning 22)  Meclizine Hcl 25 Mg Tabs (Meclizine hcl) .... 1/2-1 by mouth every 4 hours as needed for dizzy symptoms  Patient Instructions: 1)  it was good to see you today. 2)  test(s) ordered today to evaluate for possoible diabetes - your results will be posted on the phone tree for review in 48-72 hours from the time of test completion; call [phone] and enter your 9 digit MRN (listed above on this page, just below your name); if any changes need to be made or there are abnormal results, you will be contacted directly.  3)  reduce the sweets in your diet as we discussed - you need to do this even if you do not have diabetes (yet)! 4)  start nose spray and take meclizine as needed for dizzy and ear fullness symptoms your prescriptions have been electronically submitted to your pharmacy. Please take as directed. Contact our office if you believe you're having problems with the medication(s).  5)  Please schedule a follow-up appointment in 6 months to monitor a1c for possible diabetes, call sooner if problems.  Prescriptions: MECLIZINE HCL 25 MG TABS (MECLIZINE HCL) 1/2-1 by mouth every 4 hours as needed for dizzy symptoms  #30 x 1   Entered and Authorized by:   Newt Lukes MD   Signed by:   Newt Lukes MD on 12/30/2009   Method used:   Electronically to        [facility] [phone]* (retail)       [address]       [place], [place]  [address]       Ph: [phone] or [phone]       Fax: [phone]   RxID:   [id number] FLONASE 50 MCG/ACT SUSP (FLUTICASONE PROPIONATE) 2  sprays each nostril every morning  #1 x 1   Entered and Authorized by:   Newt Lukes MD   Signed by:   Newt Lukes MD on 12/30/2009   Method used:   Electronically to        [facility] [phone]* (retail)       [address]       [place], [place]  [address]       Ph: [phone] or [phone]       Fax: [phone]   RxID:   [id number]    Orders Added: 1)  TLB-A1C / Hgb A1C (Glycohemoglobin) [83036-A1C] 2)  Est. Patient Level IV [06269] 3)  Prescription Created Electronically [phone]

## 2010-02-20 NOTE — Progress Notes (Signed)
Summary: rx  Phone Note Call from Patient Call back at Home Phone [phone]   Caller: Patient Call For: ramaswamy Reason for Call: Talk to Nurse Summary of Call: 1)pt wants to know if he needs to continue his lacix...he only has 2 left.  2) pt also out of Metoprol, does he need to continue this?  He also has 6 other meds he is still taking...wants to know if he should continue? Initial call taken by: Eugene Gavia,  April 23, 2009 3:01 PM  Follow-up for Phone Call        called and spoke with pt.  pt was recently at [facility] in March.  Pt wanted to know what are the next steps for pt regarding taking Lasix and Metoprolol.  After reviewing d/c summary, it looks like pt is being following by Dr. [name] at [facility].  Informed pt to call Dr. [place] to discuss these medications.  Pt verbalized understanding and denied any questions.  Aundra Millet Reynolds LPN  April 23, 8293 4:06 PM

## 2010-02-20 NOTE — Assessment & Plan Note (Signed)
Summary: 1 month/apc   Copy to:  [name], MD Primary Provider/Referring Provider:  Newt Lukes MD - PMD. Dr Marchelle Gearing - Pulmonary, Dr Azzie Roup - Rheum, Dr [name] - Cards, Dr. [name] - sleep  CC:  Follow up visit  and complaiant with cpap averages 8 hrs per night.  History of Present Illness: 77 year old obese male with Pulmonary Fibrosis due to RA, on leflunomide/pred 10mg  and OSA , CHF with poor EF. Was admited mid-March 2011 with A Flutter/Fib and CHF exacerbation and needed ICU stay and amiodarone. CT showed evidence of pulmonary fibrosis. Pulmonary advised cautiin with amio. Thereofre, he is s/p cardioversion and ablation with retun to sinus. Cath 04/06/2009 was normal coronaries.   Ov 08/16/2009: Last visit was in May 2011. r PRe-op pulmonary clearance prior to right shoulder surgery. sTates he is going to have a 'minor' surgery on shoulder cyst. Today, we walked him and he did not desaturate on room air after walking 185 feet x 3 laps  September 30, 2009 1:50 PM  - SLEEP visit Reviewed pSG - moderate obstructive sleep apnea AHI 22/h, lowest desatn 78%  -cpaP 9 cm with med FF mask required on PSG  Got new mask prior to surgery- likes it ?  persistent REM related desaturation on 9 cm related to underlying cardiopulmonary disease  November 11, 2009 12:02 PM  Feels good, compliant with CPAP,likes his new machine & mask, this is helping,  reviewde oNO on CPAP - destauration x 9 mins only over total recording time x 10 h, PFTs showed mild restriction, no obstruction   Current Medications (verified): 1)  Multivitamins  Tabs (Multiple Vitamin) .... Take 1 Tablet By Mouth Once A Day 2)  Vitamin C Cr 500 Mg Cr-Tabs (Ascorbic Acid) .... Take 1 Tablet By Mouth Once A Day 3)  Lycopene 10 Mg Caps (Lycopene) .... Take 1 Tablet By Mouth Once A Day 4)  Chromium Picolinate 500 Mcg Tabs (Chromium Picolinate) .... Take 1 Tablet By Mouth Once A Day 5)  Calcium 500 Mg Tabs (Calcium)  .... Take 1 Tablet By Mouth Two Times A Day 6)  Urinozinc Prostate Formula .... Take 1 Tablet By Mouth Once A Day 7)  Folic Acid 800 Mcg Tabs (Folic Acid) .... Take 1 Tablet By Mouth Once A Day 8)  Vitamin B-12 500 Mcg Tabs (Cyanocobalamin) .... Take 1 Tablet By Mouth Once A Day 9)  Aspir-Low 81 Mg Tbec (Aspirin) .... Take 1 Tablet By Mouth Once A Day 10)  Furosemide 40 Mg Tabs (Furosemide) .... Take 1 Tablet By Mouth Once A Day 11)  Diltiazem Hcl Er Beads 180 Mg Xr24h-Cap (Diltiazem Hcl Er Beads) .... Take 1 Tablet By Mouth Once A Day 12)  Digoxin 0.25 Mg Tabs (Digoxin) .... Take 1 Tablet By Mouth Once A Day 13)  Arava 10 Mg Tabs (Leflunomide) .... At Bedtime 14)  Metoprolol Tartrate 25 Mg Tabs (Metoprolol Tartrate) .... 1/2 Tab By Mouth Two Times A Day 15)  Gabapentin 300 Mg Caps (Gabapentin) .... 2 Caps By Mouth At Bedtime 16)  Cpap .... Wear At Bedtime 17)  Mucinex Dm 30-600 Mg Xr12h-Tab (Dextromethorphan-Guaifenesin) .... Take 1-2 Tablets Every 12 Hours As Needed 18)  Gochi Juice .... Drink 1 Oz Once Daily 19)  Thigh High Compression Hose .Marland Kitchen.. 1 Pair - Apply Am, Remove At Bedtime Dx - Venous Insufficiency 20)  Allergy Relief 25 Mg Caps (Diphenhydramine Hcl) .... One Capsule By Mouth As Needed  Allergies (verified): 1)  !  Methotrexate  Past History:  Past Medical History: Last updated: 07/17/2009 Atrial flutter w/ RVR 03/2009 hosp,  s/p ablation = NSR chronic systolic CHF interstitial lung disease. rheumatoid arthritis. History of morbid obesity. neuropathic pain chronic left lower extremity edema Allergic rhinitis Depression Hyperlipidemia Hypertension  MD roster: pulm - ramaswamy cards- [place] rheum - anderson  Social History: Last updated: 07/17/2009 single, lives alone He does not smoke.   He said he used to smoke socially only when he drank alcohol.   He currently denies drinking alcohol.   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  His  family contact would be his sister, [name], [phone].   Review of Systems  The patient denies anorexia, fever, weight loss, weight gain, vision loss, decreased hearing, hoarseness, chest pain, syncope, dyspnea on exertion, peripheral edema, prolonged cough, headaches, hemoptysis, abdominal pain, melena, hematochezia, severe indigestion/heartburn, hematuria, muscle weakness, suspicious skin lesions, difficulty walking, depression, unusual weight change, abnormal bleeding, enlarged lymph nodes, and angioedema.    Vital Signs:  Patient profile:   77 year old male Height:      68 inches Weight:      233.4 pounds O2 Sat:      98 % on Room air Temp:     98.1 degrees F oral Pulse rate:   65 / minute BP sitting:   104 / 60  (left arm)  Vitals Entered By: Renold Genta RCP, LPN (November 11, 2009 11:57 AM)  O2 Flow:  Room air CC: Follow up visit , complaiant with cpap averages 8 hrs per night Comments Medications reviewed with patient Renold Genta RCP, LPN  November 11, 2009 11:58 AM    Physical Exam  Additional Exam:  Gen. Pleasant, well-nourished, in no distress, normal affect ENT - no lesions, no post nasal drip, class 2 airway Neck: No JVD, no thyromegaly, no carotid bruits Lungs: no use of accessory muscles, no dullness to percussion, clear without rales or rhonchi  Cardiovascular: Rhythm regular, heart sounds  normal, no murmurs or gallops, no peripheral edema Musculoskeletal: No deformities, no cyanosis or clubbing      Impression & Recommendations:  Problem # 1:  OBSTRUCTIVE SLEEP APNEA (ICD-780.57)  ct cpap 9 cm Compliance encouraged, wt loss emphasized, asked to avoid meds with sedative side effects, cautioned against driving when sleepy.  Mild desaturation on cpap may be realted to underlying pulmonary fibrosis - does not need O2 at this time.  Orders: Est. Patient Level III (45409)  Problem # 2:  PULMONARY FIBROSIS, INTERSTITIAL (ICD-516.3)  Reassess in  6 mnths  Orders: Est. Patient Level III (81191)  Patient Instructions: 1)  Copy sent to: dr Felicity Coyer 2)  Please schedule a follow-up appointment in 6 months. 3)  Stay on CPPA machine during sleep. 4)  You do not need oxygen at this time  Appended Document: 1 month/apc very mild desaturation , does not need oxygen ( 4 min < 89%) , ct CPAP use  Appended Document: 1 month/apc Pt informed of RA recs. jwr

## 2010-02-20 NOTE — Assessment & Plan Note (Signed)
Summary: ROV/ MBW   Visit Type:  Follow-up Primary Provider/Referring Provider:  Newt Lukes MD - PMD. Dr Marchelle Gearing - Pulmonary, Dr Azzie Roup - Rheum, Dr [name] - Cards, Dr. [name] - sleep  CC:  Follow up.  Pt also here to discuss PFT results.  Pt states he is still having SOB with exertion.  Occ wheezing.  Prod cough with clear mucus-mainly in the mornings.  .  History of Present Illness: OV 05/01/2009: 77 year old obese male with Pulmonary Fibrosis,  OSA, RA on leflunomide/pred 10mg  and OSA per hx (poor compliance with CPAP), CHF with poor EF. Was admited mid-March 2011 with A Flutter/Fib and CHF exacerbation and needed ICU stay and amiodarone. CT showed evidence of pulmonary fibrosis. Pulmonary advised cautiin with amio. Thereofre, he is s/p cardioversion and ablation with retun to sinus. Cath 04/06/2009 was normal coronaries. Now following up. Dyspnea has improved. Doing ADLs. He did not desaturate with exertion. Semicompliant with CPAP.   Of note, he has a DUFFLE BAG full of medicines. Many are OTC vitamins. HE states he is taking all of them.Sudie Grumbling May 14, 2009 -- Med calendar visit with NP Jeanmarie Plant. Calendar done. He is off amio - stopped by Dr. [place]. Numerous supplements esp oil based stopped.   OV 07/23/2009: Followup Pulmonary fibrosis in RA patient on leflunomide and also amiodarone for A Flutter.  Since last visit he is off amiodaroen (confirmed from Dr. Elvis Coil office) but he does not recollect that. He did not bring his med calendar with him. He states that he is on the meds that is on our med list. However, he is unable to name them. He  denies interim complaints and denies chest pain, orthopnea, hemoptysis, fever, n/v/d, edema, headache. He is complaining of daytime fatigue and excess somnolence. HE is not using CPAP. He wants sleep re-eval  Preventive Screening-Counseling & Management  Alcohol-Tobacco     Smoking Status: quit > 6 months  Current Medications  (verified): 1)  Multivitamins  Tabs (Multiple Vitamin) .... Take 1 Tablet By Mouth Once A Day 2)  Vitamin C Cr 500 Mg Cr-Tabs (Ascorbic Acid) .... Take 1 Tablet By Mouth Once A Day 3)  Lycopene 10 Mg Caps (Lycopene) .... Take 1 Tablet By Mouth Once A Day 4)  Chromium Picolinate 500 Mcg Tabs (Chromium Picolinate) .... Take 1 Tablet By Mouth Once A Day 5)  Calcium 500 Mg Tabs (Calcium) .... Take 1 Tablet By Mouth Two Times A Day 6)  Urinozinc Prostate Formula .... Take 1 Tablet By Mouth Once A Day 7)  Folic Acid 800 Mcg Tabs (Folic Acid) .... Take 1 Tablet By Mouth Once A Day 8)  Vitamin B-12 500 Mcg Tabs (Cyanocobalamin) .... Take 1 Tablet By Mouth Once A Day 9)  Aspir-Low 81 Mg Tbec (Aspirin) .... Take 1 Tablet By Mouth Once A Day 10)  Furosemide 40 Mg Tabs (Furosemide) .... Take 1 Tablet By Mouth Once A Day 11)  Diltiazem Hcl Er Beads 180 Mg Xr24h-Cap (Diltiazem Hcl Er Beads) .... Take 1 Tablet By Mouth Once A Day 12)  Digoxin 0.25 Mg Tabs (Digoxin) .... Take 1 Tablet By Mouth Once A Day 13)  Arava 10 Mg Tabs (Leflunomide) .... At Bedtime 14)  Prilosec 20 Mg Cpdr (Omeprazole) .... Take 1 Tablet By Mouth Once A Day 15)  Metoprolol Tartrate 25 Mg Tabs (Metoprolol Tartrate) .... 1/2 Tab By Mouth Two Times A Day 16)  Gabapentin 300 Mg Caps (Gabapentin) .Marland KitchenMarland KitchenMarland Kitchen  2 Caps By Mouth At Bedtime 17)  Cpap .... Wear At Bedtime 18)  Mucinex Dm 30-600 Mg Xr12h-Tab (Dextromethorphan-Guaifenesin) .... Take 1-2 Tablets Every 12 Hours As Needed 19)  Gochi Juice .... Drink 1 Oz Once Daily 20)  Thigh High Compression Hose .Marland Kitchen.. 1 Pair - Apply Am, Remove At Bedtime Dx - Venous Insufficiency  Allergies (verified): 1)  ! Methotrexate  Past History:  Family History: Last updated: 07-22-09 Mom was deceased at the age of 74, nerve  problems.  Father was deceased at the age of 2 - myocardial infarction  Social History: Last updated: 22-Jul-2009 single, lives alone He does not smoke.   He said he used to smoke  socially only when he drank alcohol.   He currently denies drinking alcohol.   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  His family contact would be his sister, [name], [phone].   Risk Factors: Alcohol Use: 0 (07-22-2009) Exercise: yes (07/22/09)  Risk Factors: Smoking Status: quit > 6 months (07/23/2009)  Past Medical History: Reviewed history from 07/22/2009 and no changes required. Atrial flutter w/ RVR 03/2009 hosp,  s/p ablation = NSR chronic systolic CHF interstitial lung disease. rheumatoid arthritis. History of morbid obesity. neuropathic pain chronic left lower extremity edema Allergic rhinitis Depression Hyperlipidemia Hypertension  MD roster: pulm - Darionna Banke cards- [place] rheum Dareen Piano  Past Surgical History: Reviewed history from 04/30/2009 and no changes required.  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      Family History: Reviewed history from 07/22/09 and no changes required. Mom was deceased at the age of 71, nerve  problems.  Father was deceased at the age of 34 - myocardial infarction  Social History: Reviewed history from 2009-07-22 and no changes required. single, lives alone He does not smoke.   He said he used to smoke socially only when he drank alcohol.   He currently denies drinking alcohol.   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  His family contact would be his sister, [name], [phone]. Smoking Status:  quit > 6 months  Review of Systems       The patient complains of shortness of breath with activity, productive cough, non-productive cough, indigestion, nasal congestion/difficulty breathing through nose, itching, depression, and joint stiffness or pain.  The patient denies shortness of breath at rest, coughing up blood, chest pain, irregular heartbeats, acid heartburn, loss of appetite, weight change,  abdominal pain, difficulty swallowing, sore throat, tooth/dental problems, headaches, sneezing, ear ache, anxiety, hand/feet swelling, rash, change in color of mucus, and fever.    Vital Signs:  Patient profile:   77 year old male Height:      68 inches Weight:      236 pounds BMI:     36.01 O2 Sat:      93 % on Room air Temp:     98.3 degrees F oral Pulse rate:   67 / minute BP sitting:   116 / 64  (right arm) Cuff size:   large  Vitals Entered By: Gweneth Dimitri RN (July 23, 2009 1:59 PM)  O2 Flow:  Room air CC: Follow up.  Pt also here to discuss PFT results.  Pt states he is still having SOB with exertion.  Occ wheezing.  Prod cough with clear mucus-mainly in the mornings.   Comments Medications reviewed with patient  Daytime contact number verified with patient. Gweneth Dimitri RN  July 23, 2009 2:00 PM    Physical Exam  General:  obese.   Head:  normocephalic and atraumatic Eyes:  PERRLA/EOM intact; conjunctiva and sclera clear Ears:  TMs intact and clear with normal canals Nose:  no deformity, discharge, inflammation, or lesions Mouth:  no deformity or lesions Neck:  no masses, thyromegaly, or abnormal cervical nodes Chest Wall:  no deformities noted Lungs:  clear bilaterally to auscultation and percussion Heart:  regular rate and rhythm, S1, S2 without murmurs, rubs, gallops, or clicks Abdomen:  bowel sounds positive; abdomen soft and non-tender without masses, or organomegaly Msk:  no deformity or scoliosis noted with normal posture Pulses:  pulses normal Extremities:  no clubbing, cyanosis, edema, or deformity noted Neurologic:  CN II-XII grossly intact with normal reflexes, coordination, muscle strength and tone Skin:  intact without lesions or rashes Cervical Nodes:  no significant adenopathy Axillary Nodes:  no significant adenopathy Psych:  alert and cooperative; normal mood and affect; normal attention span and concentration   Impression &  Recommendations:  Problem # 1:  PULMONARY FIBROSIS, INTERSTITIAL (ICD-516.3) Assessment Unchanged  Orders: Radiology Referral (Radiology) Est. Patient Level III (16109)  CT findings on 04/03/2009 of UIP/Pulmonary Fibrosis secondary to his RA.  However, he also had CHF/ A flutter at that time. Off amiodarone since April 2011. PFTs 07/12/2009 is normal.  PLAN continue cardio-pulm rehab as ongoing and f/u with pulm as ongoing Followup CT chest in 6 months  Problem # 2:  OBSTRUCTIVE SLEEP APNEA (ICD-780.57) Assessment: Unchanged He is noncompliant with cpap. And he is symptomatic  plan refer to Dr. [name] Orders: Sleep Disorder Referral (Sleep Disorder) Est. Patient Level III (60454)  Patient Instructions: 1)  please see Dr. [name] for sleep issues 2)  I will get hold of your breathing test from our ofice and call you 3)  we are not sure if you are on amiodarone 4)  next time you come here bring your med list with you 5)  My nurse will call Dr. [place] office now to find out if you are on amiodarone 6)  Have CT chest in 6 months - for lung fibrosis 7)  REturn in 6 months after CT chest  Appended Document: ROV/ MBW Pls call patient and let him know that his PFTs are normal. Need to have CT in 6 months. needs to see Dr. [name] for sleep fu and always bring med calendar fior every doctor visit  Appended Document: ROV/ MBW [id number]   Appended Document: ROV/ MBW [email or web address]   Appended Document: ROV/ MBW [email or web address]  Appended Document: ROV/ MBW called and spoke with pt and he is aware that PFT's are normal per MR---he is aware of his appt with Dr. [name] on 7-25 and will keep this appt and he is aware that we will repeat his ct scan in 6 months.  pt voiced his understanding

## 2010-02-20 NOTE — Progress Notes (Signed)
Summary: ONO  Phone Note From Other Clinic   Caller: tammy williams w/ [facility] Call For: alva Summary of Call: caller wants to confirm that the results of the ONO that was faxed to our office on 11/11/09 have been received. also wants to know if there are any new orders re: pressure change/ cpap [phone] Initial call taken by: Tivis Ringer, CNA,  November 15, 2009 2:49 PM  Follow-up for Phone Call        LMTCB-see append to 11-11-09 visit for instructions.Reynaldo Minium CMA  November 15, 2009 3:28 PM   Informed pt of RA recs lmom of Dianna Rossetti to call back if needed. Per RA no changes to be made at this time to pressure. Will sign off on phone note at this time. Zackery Barefoot CMA  November 19, 2009 11:29 AM

## 2010-02-20 NOTE — Letter (Signed)
Summary: [facility]   Imported By: Sherian Rein 08/02/2009 08:28:07  _____________________________________________________________________  External Attachment:    Type:   Image     Comment:   External Document

## 2010-02-20 NOTE — Assessment & Plan Note (Signed)
Summary: LOOSE BMS...EM   History of Present Illness Visit Type: Initial Consult Primary GI MD: Sheryn Bison MD FACP FAGA Primary Gladis Soley: Newt Lukes MD - PMD. Dr Marchelle Gearing - Pulmonary, Dr Azzie Roup - Rheum, Dr [name] - Cards, Dr. [name] - sleep Requesting Zyshawn Bohnenkamp: [name], MD Chief Complaint: Intermittant loose to watery stools x 1 year. Pt states he has some urgency after meals sometimes and having problems digesting nuts, raisins, and tomato peelings. Denies pain, blood in stool. History of Present Illness:   77 year old Caucasian male with sleep apnea, history of recurrent atrial flutter, chronic CHF, interstitial lung disease, rheumatoid arthritis, peripheral neuropathy, hypertension and hyperlipidemia, and chronic neuropathic pain. He is referred for possible colonoscopy exam.  Patient describes one to 2 years of watery diarrhea with urgency and some fecal incontinence see without rectal bleeding, rectal pain, or abdominal pain. He denies any specific food intolerances, anorexia, weight loss, upper gastrointestinal or hepatobiliary complaints. He has not had previous GI workups.  There is no history of nausea vomiting, previous hepatitis or pancreatitis. Patient also has some incontinence of urine. He is on multiple medications that are listed and reviewed in his chart. He has rheumatoid arthritis but denies NSAID use. He apparently was previously on methotrexate and had a reaction of unexplained type. Is on variety of multivitamins including folic acid and B12. He takes a daily aspirin tablet,gabepentin, digoxin, Mucinex, metaprolol, diltiazem, and a variety of vitamin supplements. He apparently has sleep apnea and uses a CPAP machine. 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  He denies abuse of alcohol or cigarettes. He is also had previous vein stripping of his legs and has a chronic peripheral neuropathy in both legs with  edema of his left leg. Has recurrent orthopedic problems and is followed by Dr. Thomasena Edis, and apparently is going to have upcoming right shoulder surgery. He denies any mental status or neurological changes, history of MIs or CVAs.   GI Review of Systems      Denies abdominal pain, acid reflux, belching, bloating, chest pain, dysphagia with liquids, dysphagia with solids, heartburn, loss of appetite, nausea, vomiting, vomiting blood, weight loss, and  weight gain.      Reports change in bowel habits and  diarrhea.     Denies anal fissure, black tarry stools, constipation, diverticulosis, fecal incontinence, heme positive stool, hemorrhoids, irritable bowel syndrome, jaundice, light color stool, liver problems, rectal bleeding, and  rectal pain.    Current Medications (verified): 1)  Multivitamins  Tabs (Multiple Vitamin) .... Take 1 Tablet By Mouth Once A Day 2)  Vitamin C Cr 500 Mg Cr-Tabs (Ascorbic Acid) .... Take 1 Tablet By Mouth Once A Day 3)  Lycopene 10 Mg Caps (Lycopene) .... Take 1 Tablet By Mouth Once A Day 4)  Chromium Picolinate 500 Mcg Tabs (Chromium Picolinate) .... Take 1 Tablet By Mouth Once A Day 5)  Calcium 500 Mg Tabs (Calcium) .... Take 1 Tablet By Mouth Two Times A Day 6)  Urinozinc Prostate Formula .... Take 1 Tablet By Mouth Once A Day 7)  Folic Acid 800 Mcg Tabs (Folic Acid) .... Take 1 Tablet By Mouth Once A Day 8)  Vitamin B-12 500 Mcg Tabs (Cyanocobalamin) .... Take 1 Tablet By Mouth Once A Day 9)  Aspir-Low 81 Mg Tbec (Aspirin) .... Take 1 Tablet By Mouth Once A Day 10)  Furosemide 40 Mg Tabs (Furosemide) .... Take 1 Tablet By Mouth Once A Day 11)  Diltiazem Hcl Er Beads 180 Mg Xr24h-Cap (Diltiazem Hcl Er Beads) .... Take 1 Tablet By Mouth Once A Day 12)  Digoxin 0.25 Mg Tabs (Digoxin) .... Take 1 Tablet By Mouth Once A Day 13)  Arava 10 Mg Tabs (Leflunomide) .... At Bedtime 14)  Metoprolol Tartrate 25 Mg Tabs (Metoprolol Tartrate) .... 1/2 Tab By Mouth Two Times  A Day 15)  Gabapentin 300 Mg Caps (Gabapentin) .... 2 Caps By Mouth At Bedtime 16)  Cpap .... Wear At Bedtime 17)  Mucinex Dm 30-600 Mg Xr12h-Tab (Dextromethorphan-Guaifenesin) .... Take 1-2 Tablets Every 12 Hours As Needed 18)  Gochi Juice .... Drink 1 Oz Once Daily 19)  Thigh High Compression Hose .Marland Kitchen.. 1 Pair - Apply Am, Remove At Bedtime Dx - Venous Insufficiency 20)  Allergy Relief 25 Mg Caps (Diphenhydramine Hcl) .... One Capsule By Mouth As Needed  Allergies (verified): 1)  ! Methotrexate  Past History:  Past medical, surgical, family and social histories (including risk factors) reviewed for relevance to current acute and chronic problems.  Past Medical History: Reviewed history from 07/17/2009 and no changes required. Atrial flutter w/ RVR 03/2009 hosp,  s/p ablation = NSR chronic systolic CHF interstitial lung disease. rheumatoid arthritis. History of morbid obesity. neuropathic pain chronic left lower extremity edema Allergic rhinitis Depression Hyperlipidemia Hypertension  MD roster: pulm - ramaswamy cards- [place] rheum Dareen Piano  Past Surgical History: Reviewed history from 04/30/2009 and no changes required.  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      Family History: Reviewed history from 07/17/2009 and no changes required. Mom was deceased at the age of 69, nerve  problems.  Father was deceased at the age of 20 - myocardial infarction  Social History: Reviewed history from 07/17/2009 and no changes required. single, lives alone He does not smoke.   He said he used to smoke socially only when he drank alcohol.   He currently denies drinking alcohol.   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  His family contact would be his sister, [name], [phone].   Review of Systems  The patient denies allergy/sinus, anemia, anxiety-new, arthritis/joint pain, back pain, blood  in urine, breast changes/lumps, change in vision, confusion, cough, coughing up blood, depression-new, fainting, fatigue, fever, headaches-new, hearing problems, heart murmur, heart rhythm changes, itching, menstrual pain, muscle pains/cramps, night sweats, nosebleeds, pregnancy symptoms, shortness of breath, skin rash, sleeping problems, sore throat, swelling of feet/legs, swollen lymph glands, thirst - excessive , urination - excessive , urination changes/pain, urine leakage, vision changes, and voice change.   General:  Denies fever, chills, sweats, anorexia, fatigue, weakness, malaise, weight loss, and sleep disorder. CV:  Complains of palpitations, dyspnea on exertion, and peripheral edema; denies chest pains, angina, syncope, orthopnea, PND, and claudication. Resp:  Complains of dyspnea with exercise and wheezing; denies dyspnea at rest, cough, sputum, coughing up blood, and pleurisy. GI:  Complains of diarrhea; denies difficulty swallowing, pain on swallowing, nausea, indigestion/heartburn, vomiting, vomiting blood, abdominal pain, jaundice, gas/bloating, constipation, change in bowel habits, bloody BM's, black BMs, and fecal incontinence. GU:  Complains of urinary frequency, nocturnal urination, and urinary incontinence; denies urinary burning, blood in urine, urinary hesitancy, penile discharge, genital sores, decreased libido, and erectile dysfunction. MS:  Complains of joint pain / LOM, joint swelling, joint stiffness, joint deformity, low back pain, muscle weakness, muscle cramps, and leg pain at night; denies muscle atrophy,  leg pain with exertion, and shoulder pain / LOM hand / wrist pain (CTS). Derm:  Denies rash, itching, dry skin, hives, moles, warts, and unhealing ulcers. Neuro:  Complains of weakness, abnormal sensation, and radiculopathy other:; denies paralysis, seizures, syncope, tremors, vertigo, transient blindness, frequent falls, frequent headaches, difficulty walking, headache,  sciatica, restless legs, memory loss, and confusion. Psych:  Denies depression, anxiety, memory loss, suicidal ideation, hallucinations, paranoia, phobia, and confusion. Endo:  Denies cold intolerance, heat intolerance, polydipsia, polyphagia, polyuria, unusual weight change, and hirsutism. Heme:  Complains of bruising; denies bleeding, enlarged lymph nodes, and pagophagia.  Vital Signs:  Patient profile:   77 year old male Height:      68 inches Weight:      2342 pounds BMI:     357.39 Pulse rate:   90 / minute Pulse rhythm:   regular BP sitting:   126 / 72  (right arm) Cuff size:   large  Vitals Entered By: Christie Nottingham CMA Duncan Dull) (September 06, 2009 10:03 AM)  Physical Exam  General:  Well developed, well nourished, no acute distress.healthy appearing and obese.   Head:  Normocephalic and atraumatic. Eyes:  PERRLA, no icterus.exam deferred to patient's ophthalmologist.   Lungs:  Clear throughout to auscultation. Heart:  Regular rate and rhythm; no murmurs, rubs,  or bruits.soft 1/6 systolic ejection murmur noted. Abdomen:  Soft, nontender and nondistended. No masses, hepatosplenomegaly or hernias noted. Normal bowel sounds.no organomegaly, masses or tenderness. Rectal:  Normal exam.hemocult negative.  Good rectal tone and without evidence of fissures or fistulae. No rectal masses or tenderness. Prostate:  .normal size prostate.   Msk:  joint tenderness, decreased ROM, and rhematoid changes.  Large palpable cyst on anterior right shoulder area. Extremities:  bilateral peripheral edema left leg much larger than right but the edema in both legs with pitting. Compression stockings on both legs. Neurologic:  Alert and  oriented x4;  grossly normal neurologically. Skin:  Intact without significant lesions or rashes. Cervical Nodes:  No significant cervical adenopathy. Psych:  Alert and cooperative. Normal mood and affect.   Impression & Recommendations:  Problem # 1:  DIARRHEA  (ICD-787.91) Assessment Unchanged Chronic diarrhea without rectal bleeding, abdominal pain, or systemic symptoms.... rule out underlying inflammatory bowel disease, malabsorption, versus drug effect. Labs are due more to including digoxin level. Stool exam also ordered for culture, O&P, fat stain, and fecal elastase-1 exam. He has not had colonoscopy which also has been scheduled at his convenience after the above labs have been completed and stool exams examined. I suspect he has diverticulosis and may have an element of segmental colitis. Orders: TLB-CBC Platelet - w/Differential (85025-CBCD) TLB-BMP (Basic Metabolic Panel-BMET) (80048-METABOL) TLB-Hepatic/Liver Function Pnl (80076-HEPATIC) TLB-TSH (Thyroid Stimulating Hormone) (84443-TSH) TLB-B12, Serum-Total ONLY (16109-U04) TLB-Ferritin (82728-FER) TLB-Folic Acid (Folate) (82746-FOL) TLB-IBC Pnl (Iron/FE;Transferrin) (83550-IBC) TLB-IgA (Immunoglobulin A) (82784-IGA) T-Beta Carotene (54098-11914) T-Sprue Panel (Celiac Disease Aby Eval) (83516x3/[address]) T-Culture, Stool (87045/87046-70140) T-Stool Fats [place] Stain [phone]) T-Stool for O&P (86578-46962) TLB-Digoxin (Lanoxin) (80162-DIG) T- * Misc. Laboratory test [phone])  Problem # 2:  CHRONIC SYSTOLIC HEART FAILURE (ICD-428.22) Assessment: Improved continue multiple medications per Dr. [name]  Problem # 3:  SCREENING, COLON CANCER (ICD-V76.51) Assessment: Comment Only  Problem # 4:  RHEUMATOID ARTHRITIS (ICD-714.0) Assessment: Improved Examination shows rheumatoid deformity of his hands but no active swollen arthritic joints. He denies NSAID use at this time.  Problem # 5:  VENOUS INSUFFICIENCY, LEFT LEG (ICD-459.81) Assessment: Unchanged  Problem # 6:  PERIPHERAL NEUROPATHY (ICD-356.9) Assessment:  Unchanged labs order to exclude B12 deficiency and other malabsorption problems  Problem # 7:  ATRIAL FLUTTER (ICD-427.32) Assessment: Improved  Problem # 8:   OBSTRUCTIVE SLEEP APNEA (ICD-780.57) Assessment: Unchanged Continue multiple pulmonary medications and CPAP machine usage.  Patient Instructions: 1)  Please go to the basement for lab work. 2)  You are scheduled for a colonoscopy. 3)  The medication list was reviewed and reconciled.  All changed / newly prescribed medications were explained.  A complete medication list was provided to the patient / caregiver. 4)  Copy sent to : Dr. [name], Dr. [name] and Dr. Marchelle Gearing i pulmonary 5)  Please continue current medications.  6)  Colonoscopy and Flexible Sigmoidoscopy brochure given.  7)  Conscious Sedation brochure given.   Appended Document: LOOSE BMS...EM    Clinical Lists Changes  Medications: Added new medication of MOVIPREP 100 GM  SOLR (PEG-KCL-NACL-NASULF-NA ASC-C) As per prep instructions. - Signed Rx of MOVIPREP 100 GM  SOLR (PEG-KCL-NACL-NASULF-NA ASC-C) As per prep instructions.;  #1 x 0;  Signed;  Entered by: Ashok Cordia RN;  Authorized by: Mardella Layman MD [facility];  Method used: Electron[facility] to [facility] [phone]*, [address] [place], [place], [place]  [address], Ph: [phone] or [phone], Fax: [phone] Orders: Added new Test order of Colonoscopy (Colon) - Signed    Prescriptions: MOVIPREP 100 GM  SOLR (PEG-KCL-NACL-NASULF-NA ASC-C) As per prep instructions.  #1 x 0   Entered by:   Ashok Cordia RN   Authorized by:   Mardella Layman MD [facility]   Signed by:   Ashok Cordia RN on 09/06/2009   Method used:   [facility]ronically to        [facility] [phone]* (retail)       [address]       [place], [place]  [address]       Ph: [phone] or [phone]       Fax: [phone]   RxID:   [id number]

## 2010-02-20 NOTE — Cardiovascular Report (Signed)
Summary: [place]  [place]   Imported By: Sherian Rein 08/02/2009 08:25:26  _____________________________________________________________________  External Attachment:    Type:   Image     Comment:   External Document

## 2010-02-20 NOTE — Progress Notes (Signed)
Summary: lmomtcb x2  Phone Note Call from Patient   Caller: Patient Call For: Vasili Fok Summary of Call: Returning phone call. Initial call taken by: Darletta Moll,  July 31, 2009 2:40 PM  Follow-up for Phone Call        lmomtcb Randell Loop [facility]  July 31, 2009 2:48 PM    lmomtcb and explained to pt to ask the ladies to please put him through to triage . Randell Loop [facility]  July 31, 2009 3:04 PM    Additional Follow-up for Phone Call Additional follow up Details #1::        ATC pt's home number-number did not ring-was put directly to pt's voicemail.  LMOMTCB.  Gweneth Dimitri RN  August 01, 2009 3:04 PM     Additional Follow-up for Phone Call Additional follow up Details #2::    called and spoke with pt and per MR---pft's are normal and pt is aware.  has appt with RA on 7-25 and he will keep this appt---he is aware we will repeat his ct scan in 6 months.  pt voiced his understanding. Randell Loop CMA  August 02, 2009 9:09 AM

## 2010-02-20 NOTE — Letter (Signed)
Summary: [facility]   Imported By: Sherian Rein 01/06/2010 09:27:10  _____________________________________________________________________  External Attachment:    Type:   Image     Comment:   External Document

## 2010-02-20 NOTE — Progress Notes (Signed)
Summary: overnight oxemitry/cb  Phone Note From Pharmacy   Caller: [facility] -[phone]-tammy williams Call For: alva  Summary of Call: does pt need another over night oxemitry in 4 weeks Initial call taken by: Lacinda Axon,  November 04, 2009 10:24 AM  Follow-up for Phone Call        Tammy at [facility] states they have an order to do an ONO in weeks after CPAP set up. Sh estates they did one onthe pt when he was set up on CPAP. She wants to know does Dr. [name] want another ONO now, which would be4 weeks after setup. Please advise. Carron Curie CMA  November 04, 2009 11:40 AM   Additional Follow-up for Phone Call Additional follow up Details #1::        yes- deaturations on sleep study hence recheck ONO on CPAP  Order faxed to [facility] to repeat ono on cpap. Alfonso Ramus  November 06, 2009 8:49 AM

## 2010-02-20 NOTE — Assessment & Plan Note (Signed)
Summary: left middle finger swelling and pain-lb   Vital Signs:  Patient profile:   77 year old male Height:      68 inches (172.72 cm) Weight:      234.2 pounds (106.45 kg) O2 Sat:      97 % on Room air Temp:     98.6 degrees F (37.00 degrees C) oral Pulse rate:   89 / minute BP sitting:   127 / 84  (left arm) Cuff size:   large  Vitals Entered By: Orlan Leavens RMA (February 03, 2010 1:20 PM)  O2 Flow:  Room air CC: Middle finger swollen/pain Is Patient Diabetic? No Pain Assessment Patient in pain? yes     Location: Middle finger Type: aching   Primary Care Provider:  Newt Lukes MD - PMD. Dr Marchelle Gearing - Pulmonary, Dr Azzie Roup - Rheum, Dr [name] - Cards, Dr. [name] - sleep  CC:  Middle finger swollen/pain.  History of Present Illness: here for middle finger swelling and pain, right hand onset 11pm yesterday (24h) ? 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 [place] - initially on pradaxa but no anticoag rec at this time  3) RA - follows with rheum for same - on arava since start of 2011, denies adv effects from same, pain and mobility much improved - intermit pred use  4) OSA - intermittent CPAP use - believes weight loss which has occured since 03/2009 hosp has helped his OSA symptoms so CPAP "not necessary most  of the time"  5) chronic neuropathic pain - burning and numbness affects hands and feet, no recent change in symptoms - controlled with gabapentin -  also concern with elev glc at dr. Swaz[place]and - random 130s - hx borderline sugar in past tries to avoid steroids for arthritis (RA) or pulm flares admits to overindulg in sweets at night and snacks   Current Medications (verified): 1)  Multivitamins  Tabs (Multiple Vitamin) .... Take 1 Tablet By Mouth Once A Day 2)  Vitamin C Cr 500 Mg Cr-Tabs (Ascorbic Acid) .... Take 1 Tablet By Mouth Once A Day 3)  Lycopene 10 Mg Caps (Lycopene) .... Take 1 Tablet By Mouth Once A Day 4)  Chromium Picolinate 500 Mcg Tabs (Chromium Picolinate) .... Take 1 Tablet By Mouth Once A Day 5)  Calcium 500 Mg Tabs (Calcium) .... Take 1 Tablet By Mouth Two Times A Day 6)  Urinozinc Prostate Formula .... Take 1 Tablet By Mouth Once A Day 7)  Folic Acid 800 Mcg Tabs (Folic Acid) .... Take 1 Tablet By Mouth Once A Day 8)  Vitamin B-12 500 Mcg Tabs (Cyanocobalamin) .... Take 1 Tablet By Mouth Once A Day 9)  Aspir-Low 81 Mg Tbec (Aspirin) .... Take 1 Tablet By Mouth Once A Day  10)  Furosemide 40 Mg Tabs (Furosemide) .... Take 1 Tablet By Mouth Once A Day 11)  Diltiazem Hcl Er Beads 180 Mg Xr24h-Cap (Diltiazem Hcl Er Beads) .... Take 1 Tablet By Mouth Once A Day 12)  Digoxin 0.25 Mg Tabs (Digoxin) .... Take 1 Tablet By Mouth Once A Day 13)  Arava 10 Mg Tabs (Leflunomide) .... At Bedtime 14)  Metoprolol Tartrate 25 Mg Tabs (Metoprolol Tartrate) .... 1/2 Tab By Mouth Two Times A Day 15)  Gabapentin 300 Mg Caps (Gabapentin) .... 2 Caps By Mouth At Bedtime 16)  Cpap .... Wear At Bedtime 17)  Mucinex Dm 30-600 Mg Xr12h-Tab (Dextromethorphan-Guaifenesin) .... Take 1-2 Tablets Every 12 Hours As Needed 18)  Gochi Juice .... Drink 1 Oz Once Daily 19)  Thigh High Compression Hose .Marland Kitchen.. 1 Pair - Apply Am, Remove At Bedtime Dx - Venous Insufficiency 20)  Allergy Relief 25 Mg Caps  (Diphenhydramine Hcl) .... One Capsule By Mouth As Needed 21)  Flonase 50 Mcg/act Susp (Fluticasone Propionate) .... 2 Sprays Each Nostril Every Morning 22)  Meclizine Hcl 25 Mg Tabs (Meclizine Hcl) .... 1/2-1 By Mouth Every 4 Hours As Needed For Dizzy Symptoms  Allergies (verified): 1)  ! Methotrexate  Past History:  Past Medical History: Atrial flutter w/ RVR 03/2009 hosp,  s/p ablation = NSR chronic systolic CHF  interstitial lung disease rheumatoid arthritis History of morbid obesity neuropathic pain chronic left lower extremity edema Allergic rhinitis Depression  Hyperlipidemia Hypertension  MD roster: pulm - ramaswamy cards- Sw[place]and  rheum - anderson  Review of Systems  The patient denies anorexia, fever, headaches, and muscle weakness.    Physical Exam  General:  alert, well-developed, well-nourished, and cooperative to examination.   nontoxic Lungs:  normal respiratory effort, no intercostal retractions or use of accessory muscles; normal breath sounds bilaterally - no crackles and no wheezes.    Heart:  normal rate, regular rhythm, no murmur, and no rub. RLE without edema, LLE with chronic 1+ edema (wearing compression hose for same). normal DP pulses and normal cap refill in all 4 extremities    Msk:  diffuse swelling of right middle finger on flexor surface, esp between MCP and PIP, no joint synovitis - neurovasc intact, limited flexion of hand due to swelling and pain, mod tender to palp over base of 3rd finger flexor/palm surface   Impression & Recommendations:  Problem # 1:  FINGER PAIN (ICD-729.5) acute swelling of 3rd right finger, no trauma recalled but pt concerned with fx - check xray now onset in setting of self directed pred wean - ?rheum flare though no other joints affected cont current dose pred w/o further wean unless directed by rheum - pt agrees to same will tx with emperic doxy gven pt concern for "spider bite" though little exam o rhx evidence  to suggest infx Orders: T-Hand Right 3 views (73130TC) Prescription Created Electronically [phone]) Orthopedic Surgeon Referral (Ortho Surgeon)  xray reviewed by me - no bony damage but will arrange for eval by hand specialist to exclude tendon or pulley rupture given acute onset  Problem # 2:  RHEUMATOID ARTHRITIS (ICD-714.0)  Examination shows rheumatoid deformity of his hands but no active swollen arthritic joints. cont tx as ongoing with rheum, no further pred reduction at this time  Complete Medication List: 1)  Multivitamins Tabs (Multiple vitamin) .... Take 1 tablet by mouth once a day 2)  Vitamin C Cr 500 Mg Cr-tabs (Ascorbic acid) .... Take 1 tablet by mouth once a  day 3)  Lycopene 10 Mg Caps (Lycopene) .... Take 1 tablet by mouth once a day 4)  Chromium Picolinate 500 Mcg Tabs (Chromium picolinate) .... Take 1 tablet by mouth once a day 5)  Calcium 500 Mg Tabs (Calcium) .... Take 1 tablet by mouth two times a day 6)  Urinozinc Prostate Formula  .... Take 1 tablet by mouth once a day 7)  Folic Acid 800 Mcg Tabs (Folic acid) .... Take 1 tablet by mouth once a day 8)  Vitamin B-12 500 Mcg Tabs (Cyanocobalamin) .... Take 1 tablet by mouth once a day 9)  Aspir-low 81 Mg Tbec (Aspirin) .... Take 1 tablet by mouth once a day 10)  Furosemide 40 Mg Tabs (Furosemide) .... Take 1 tablet by mouth once a day 11)  Diltiazem Hcl Er Beads 180 Mg Xr24h-cap (Diltiazem hcl er beads) .... Take 1 tablet by mouth once a day 12)  Digoxin 0.25 Mg Tabs (Digoxin) .... Take 1 tablet by mouth once a day 13)  Arava 10 Mg Tabs (Leflunomide) .... At bedtime 14)  Metoprolol Tartrate 25 Mg Tabs (Metoprolol tartrate) .... 1/2 tab by mouth two times a day 15)  Gabapentin 300 Mg Caps (Gabapentin) .... 2 caps by mouth at bedtime 16)  Cpap  .... Wear at bedtime 17)  Mucinex Dm 30-600 Mg Xr12h-tab (Dextromethorphan-guaifenesin) .... Take 1-2 tablets every 12 hours as needed 18)  Gochi Juice  .... Drink 1 oz once  daily 19)  Thigh High Compression Hose  .Marland Kitchen.. 1 pair - apply am, remove at bedtime dx - venous insufficiency 20)  Allergy Relief 25 Mg Caps (Diphenhydramine hcl) .... One capsule by mouth as needed 21)  Flonase 50 Mcg/act Susp (Fluticasone propionate) .... 2 sprays each nostril every morning 22)  Meclizine Hcl 25 Mg Tabs (Meclizine hcl) .... 1/2-1 by mouth every 4 hours as needed for dizzy symptoms 23)  Doxycycline Hyclate 100 Mg Caps (Doxycycline hyclate) .Marland Kitchen.. 1 by mouth two times a day x 7 days  Patient Instructions: 1)  it was good to see you today. 2)  xray ordered today - your results will be called to you after review 3)  doxycycline antibioitcs - your prescriptions have been electronically submitted to your pharmacy. Please take as directed. Contact our office if you believe you're having problems with the medication(s).  4)  no other changes in your medications - do not taper down on prednisione until you see dr. Dareen Piano 5)  keep scheduled appointment to monitor a1c for possible diabetes, call sooner if problems.  Prescriptions: DOXYCYCLINE HYCLATE 100 MG CAPS (DOXYCYCLINE HYCLATE) 1 by mouth two times a day x 7 days  #14 x 0   Entered and Authorized by:   Newt Lukes MD   Signed by:   Newt Lukes MD on 02/03/2010   Method used:   Electronically to        [facility]* (retail)       [address]       [place], [place]  [address]       Ph: [phone]       Fax: [phone]   RxID:   [id number]    Orders Added: 1)  T-Hand Right 3 views [73130TC] 2)  Est. Patient Level IV [95638] 3)  Prescription Created Electronically [[id number]] 4)  Orthopedic Surgeon Referral [Ortho Surgeon]

## 2010-02-20 NOTE — Letter (Signed)
Summary: Surgical Clearance/[facility]  Surgical Clearance/[facility]   Imported By: Sherian Rein 08/21/2009 08:55:33  _____________________________________________________________________  External Attachment:    Type:   Image     Comment:   External Document

## 2010-02-20 NOTE — Progress Notes (Signed)
Summary: talk to dr  Phone Note Call from Patient   Caller: Patient Call For: ramaswamy Summary of Call: need to talk to dr Marchelle Gearing about surgery he is having tomorrow. Initial call taken by: Rickard Patience,  September 19, 2009 2:00 PM  Follow-up for Phone Call        attempted to call pt at his home number listed----[email or web address] for pt to return my call. Randell Loop CMA  September 19, 2009 2:26 PM   Additional Follow-up for Phone Call Additional follow up Details #1::        pt called me back and he stated that the original surgery for his shoulder has been changed now and now they are only going to remove the cyst on his shoulder this time.  he wanted to make sure the MR was aware of this.  please advise. thanks Randell Loop CMA  September 19, 2009 2:40 PM     Additional Follow-up for Phone Call Additional follow up Details #2::    spoke to patient. He is now scheduled for cyst removal in shoulder. Just an FYI call. Surgery will be in [place] per patient. Advised no change in recommendations. He is understanding Follow-up by: Kalman Shan MD,  September 19, 2009 3:13 PM

## 2010-02-20 NOTE — Letter (Signed)
Summary: Patient Notice- Colon Biospy Results  [facility]  [address] [place], [place] [address]   Phone: [phone]  Fax: [phone]        September 18, 2009 MRN: [id number]    KELLEN DUTCH [address] [place], [place]  [address]    Dear Mr. OHM,  I am pleased to inform you that the biopsies taken during your recent colonoscopy did not show any evidence of cancer upon pathologic examination.  Additional information/recommendations:  __No further action is needed at this time.  Please follow-up with      your primary care physician for your other healthcare needs.  X__Please call [phone] to schedule a return visit to review      your condition.BIOPSIES ARE NORMAL.  __Continue with the treatment plan as outlined on the day of your      exam.  __You should have a repeat colonoscopy examination for this problem           in _ years.  Please call us if you are having persistent problems or have questions about your condition that have not been fully answered at this time.  Sincerely,  Mardella Layman MD [facility]   This letter has been electronically signed by your physician.  Appended Document: Patient Notice- Colon Biospy Results letter mailed 9.2.11

## 2010-02-20 NOTE — Miscellaneous (Signed)
Summary: Order/[facility]  Order/[facility]   Imported By: Lester Toombs 10/15/2009 08:22:25  _____________________________________________________________________  External Attachment:    Type:   Image     Comment:   External Document

## 2010-02-20 NOTE — Miscellaneous (Signed)
  Clinical Lists Changes  pt called to say he still has lip swelling despite medrol and benedryl.  I have told him to go immediately to an er or urgent care for treatment..  he is not sure he will do this.

## 2010-02-20 NOTE — Procedures (Signed)
Summary: [facility]   Imported By: Lester Brundidge 11/26/2009 10:36:44  _____________________________________________________________________  External Attachment:    Type:   Image     Comment:   External Document

## 2010-02-20 NOTE — Miscellaneous (Signed)
Summary: Cardiac Rehab/[facility]  Cardiac Rehab/[facility]   Imported By: Sherian Rein 08/14/2009 10:06:21  _____________________________________________________________________  External Attachment:    Type:   Image     Comment:   External Document

## 2010-02-20 NOTE — Letter (Signed)
Summary: CCM Note/[facility]  CCM Note/[facility]   Imported By: Sherian Rein 05/06/2009 10:29:40  _____________________________________________________________________  External Attachment:    Type:   Image     Comment:   External Document

## 2010-02-20 NOTE — Progress Notes (Signed)
Summary: called for xray results, will call back at 1pm   Phone Note Call from Patient Call back at Home Phone [phone]   Caller: Patient Call For: Newt Lukes MD Summary of Call: Pt called, pt requests results of xray of finger yesterday, pt will call back at 1pm or afte to talk to triage. Pt lost phone is using somebody else's phone. Initial call taken by: Verdell Face,  February 04, 2010 12:12 PM  Follow-up for Phone Call        Pt advised of lab results and Rx was called into [facility] as requested by pt Follow-up by: Margaret Pyle, CMA,  February 04, 2010 2:55 PM    Prescriptions: DOXYCYCLINE HYCLATE 100 MG CAPS (DOXYCYCLINE HYCLATE) 1 by mouth two times a day x 7 days  #14 x 0   Entered and Authorized by:   Margaret Pyle, CMA   Signed by:   Margaret Pyle, CMA on 02/04/2010   Method used:   [facility] ...       [facility]  [address] [id number]       [address]       [place], [place]  [address]       Ph: [phone] or [phone]       Fax: [phone]   RxID:   [id number]

## 2010-02-20 NOTE — Letter (Signed)
Summary: [facility]  [facility]   Imported By: Lester Commerce 01/16/2010 11:12:37  _____________________________________________________________________  External Attachment:    Type:   Image     Comment:   External Document

## 2010-02-20 NOTE — Progress Notes (Signed)
Summary: speak to nurse   Phone Note Call from Patient Call back at Home Phone [phone]   Caller: Patient Call For: Dr Jarold Motto Reason for Call: Talk to Nurse Summary of Call: Patient wants to speak to nurse regarding his prep  Initial call taken by: Tawni Levy,  September 13, 2009 12:41 PM  Follow-up for Phone Call        Pt would lilke rx for movi prep to be sent to [facility] in [facility],  He was told it would be less expensive here.  Rx sent as requested.  Pt had numerous questions re prep insturctions.  Went over instructions at length with pt.  States he understands. Follow-up by: Ashok Cordia RN,  September 13, 2009 2:43 PM    Prescriptions: MOVIPREP 100 GM  SOLR (PEG-KCL-NACL-NASULF-NA ASC-C) As per prep instructions.  #1 x 0   Entered by:   Ashok Cordia RN   Authorized by:   Mardella Layman MD [facility]   Signed by:   Ashok Cordia RN on 09/13/2009   Method used:   Electronically to        [facility]  [address] (retail)       [address]       [place], [place]  [address]       Ph: [phone]       Fax: [phone]   RxID:   [id number]

## 2010-02-20 NOTE — Letter (Signed)
Summary: Patient Notice- Polyp Results  [facility]  [address] [place], [place] [address]   Phone: [phone]  Fax: [phone]        September 18, 2009 MRN: [id number]    [name] [address] [place], [place]  [address]    Dear [name],  I am pleased to inform you that the colon polyp(s) removed during your recent colonoscopy was (were) found to be benign (no cancer detected) upon pathologic examination.  I recommend you have a repeat colonoscopy examination in _ years to look for recurrent polyps, as having colon polyps increases your risk for having recurrent polyps or even colon cancer in the future.FOLLOW ONLY AS NEEDED. Should you develop new or worsening symptoms of abdominal pain, bowel habit changes or bleeding from the rectum or bowels, please schedule an evaluation with either your primary care physician or with me.  Additional information/recommendations:  __ No further action with gastroenterology is needed at this time. Please      follow-up with your primary care physician for your other healthcare      needs.  __ Please call [phone] to schedule a return visit to review your      situation.  _X_ Please keep your follow-up visit as already scheduled.  __ Continue treatment plan as outlined the day of your exam.  Please call us if you are having persistent problems or have questions about your condition that have not been fully answered at this time.  Sincerely,  Mardella Layman MD [facility]  This letter has been electronically signed by your physician.  Appended Document: Patient Notice- Polyp Results letter mailed 9.2.11

## 2010-02-20 NOTE — Progress Notes (Signed)
Summary: clearance   -- awaiting fax   Phone Note From Other Clinic   Caller: dr Thomasena Edis -[phone] -wendy Call For: [facility] Summary of Call: still needing clearance on pt for shoulder surgery fax [phone] Initial call taken by: Lacinda Axon,  August 15, 2009 8:57 AM  Follow-up for Phone Call        Spoke with Toniann Fail.  Per Toniann Fail, Dr. Thomasena Edis needs to do a right shoulder open cyst incision.  This will be done under general anesthia.  Needs surgery clearance from MR.  She is refaxing the form to be filled out by MR to triage.  Will forward message to him to address today when he is in the office.  Gweneth Dimitri RN  August 15, 2009 9:17 AM  Faxed Received -- will give to MR when he is in office today.  Gweneth Dimitri RN  August 15, 2009 9:48 AM   Additional Follow-up for Phone Call Additional follow up Details #1::        Per MR, pt needs OV this week for surgery clearance.  Gweneth Dimitri RN  August 15, 2009 1:51 PM  Spoke with pt, informed him of this.  OV scheduled for August 16, 2009 at 210pm with MR -- pt aware.    Called Dr. Thomasena Edis office.  Spoke with Toniann Fail.  Informed her pt will see MR tomorrow for surgery clearance.  She verbalized understanding.    Surgical Clearance Form left with Mindy, who will be working with MR tomorrow, to be placed with pt at time of OV.    Additional Follow-up by: Gweneth Dimitri RN,  August 15, 2009 1:55 PM     Appended Document: clearance   -- awaiting fax  Surgery Clearance form completed by MR and faxed to [facility] at [phone].  Called Dr. Thomasena Edis office, spoke with Toniann Fail.  Advised her of this.  She verbalized understanding.  Will place form in MR's scan folder.

## 2010-02-20 NOTE — Assessment & Plan Note (Signed)
Summary: med cal/mh   Primary Provider/Referring Provider:  [name], [facility], Dr Marchelle Gearing - Pulmonary, Dr. Azzie Roup - Rheum, Dr. Swaziland - Cardiology  CC:  Med Calendar.  History of Present Illness: OV 05/01/2009: 77 year old obese male with OSA, RA on leflunomide/pred 10mg  and OSA per hx (poor compliance with CPAP), CHF with poor EF. Was admited mid-March 2011 with A Flutter/Fib and CHF exacerbation and needed ICU stay and amiodarone. CT showed evidence of pulmonary fibrosis. Pulmonary advised cautiin with amio. Thereofre, he is s/p cardioversion and ablation with retun to sinus. Cath 04/06/2009 was normal coronaries. Now following up. Dyspnea has improved. Doing ADLs. He did not desaturate with exertion. Semicompliant with CPAP.   Of note, he has a DUFFLE BAG full of medicines. Many are OTC vitamins. HE states he is taking all of them.Marland Kitchen  May 14, 2009 --Presents for follow up and med review. He has a duffle bag of meds. He has over 20 nutritional supplements along w/ multiple prescription meds. He has many meds from rehab center and needs refills on 3 meds. He is in the process of changing PCP doctors. We reviewed all his meds and organized them into a med calendar. We stopped several of his supplements including the oil based meds. Denies chest pain, orthopnea, hemoptysis, fever, n/v/d, edema, headache. he is now off Amiodarone -stopped by Dr. Swaziland.   Medications Prior to Update: 1)  Furosemide 40 Mg Tabs (Furosemide) .... Take 1 Tablet By Mouth Two Times A Day 2)  Digoxin 0.25 Mg Tabs (Digoxin) .... Take 1 Tablet By Mouth Once A Day 3)  Amiodarone Hcl 200 Mg Tabs (Amiodarone Hcl) .... Take 1 Tablet By Mouth Once A Day 4)  Pradaxa 150 Mg Caps (Dabigatran Etexilate Mesylate) .... Take 1 Tablet By Mouth Two Times A Day 5)  Diltiazem Hcl Er Beads 180 Mg Xr24h-Cap (Diltiazem Hcl Er Beads) .... Take 1 Tablet By Mouth Once A Day 6)  Prilosec 20 Mg Cpdr (Omeprazole) ....  Take 1 Tablet By Mouth Once A Day 7)  Multivitamins  Tabs (Multiple Vitamin) .... Take 1 Tablet By Mouth Once A Day 8)  Aspir-Low 81 Mg Tbec (Aspirin) .... Take 1 Tablet By Mouth Once A Day 9)  Ginkoba 40 Mg Tabs (Ginkgo Biloba) .... Take 1 Tablet By Mouth Once A Day 10)  Chromium Picolinate 500 Mcg Tabs (Chromium Picolinate) .... Take 1 Tablet By Mouth Once A Day 11)  Vitamin C Cr 500 Mg Cr-Tabs (Ascorbic Acid) .... Take 1 Tablet By Mouth Once A Day 12)  Sm Coral Calcium 1000 (390 Ca) Mg Tabs (Coral Calcium) .... Take 1 Tablet By Mouth Once A Day 13)  Urinozinc Prostate Formula .... Take 1 Tablet By Mouth Once A Day 14)  Omega-3 350 Mg Caps (Omega-3 Fatty Acids) .... Take 1 Tablet By Mouth Once A Day 15)  Cvs Vitamin D 2000 Unit Caps (Cholecalciferol) .... Take 1 Tablet By Mouth Once A Day 16)  Vitamin E 400 Unit Caps (Vitamin E) .... Take 1 Tablet By Mouth Once A Day 17)  Lycopene 10 Mg Caps (Lycopene) .... Take 1 Tablet By Mouth Once A Day 18)  Folic Acid 800 Mcg Tabs (Folic Acid) .... Take 1 Tablet By Mouth Once A Day 19)  Glucosamine-Chondroitin 1500-1200 Mg/27ml Liqd (Glucosamine-Chondroitin) .... Take 1 Tablet By Mouth Two Times A Day 20)  Vitamin B-12 500 Mcg Tabs (Cyanocobalamin) .... Take 1 Tablet By Mouth Once A Day 21)  Gochi Dietary  Supplement .... As Directed 22)  Arava 10 Mg Tabs (Leflunomide) .... At Bedtime 23)  Neurontin 600 Mg Tabs (Gabapentin) .... Take 1 Tab By Mouth At Bedtime 24)  Prednisone 10 Mg Tabs (Prednisone) .... Take 1 Tablet By Mouth Once A Day 25)  Lopressor 50 Mg Tabs (Metoprolol Tartrate) .... 1/2 Tab Daily  Allergies (verified): 1)  ! Methotrexate  Past History:  Past Medical History: Last updated: 09-May-2009 Atrial flutter with rapid ventricular response, status post     ablation, currently heart rate controlled on amiodarone. 2. Pulmonary edema due to acute decompensation of systolic congestive     heart failure. 3. Transient hematuria from  heparin. 4. Respiratory distress due to pulmonary edema. 5. Escherichia coli urinary tract infection. 6. History of interstitial lung disease. 7. History of rheumatoid arthritis. 8 .History of morbid obesity. 9. History of neuropathic pain. 10. History of chronic left lower extremity edema.    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      Family History: Last updated: 05/09/09 Mom was deceased at the age of 60 and had nerve   problems.   Father was deceased at the age of 1 with a history of   myocardial infarction that was massive.   Social History: Last updated: 2009-05-09 He does not smoke.  He said he used to smoke socially   only when he drank alcohol.  He currently denies drinking alcohol.  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  His family contact would be his sister, [name], [phone].   Review of Systems      See HPI  Vital Signs:  Patient profile:   77 year old male Weight:      241.13 pounds O2 Sat:      95 % on Room air Temp:     97.6 degrees F oral Pulse rate:   88 / minute BP sitting:   170 / 90  (left arm) Cuff size:   regular  Vitals Entered By: Boone Master CNA (May 14, 2009 10:58 AM)  O2 Flow:  Room air  Physical Exam  Additional Exam:  GEN: A/Ox3; pleasant , NAD HEENT:  [place]/AT, , EACs-clear, TMs-wnl, NOSE-clear, THROAT-clear NECK:  Supple w/ fair ROM; no JVD; normal carotid impulses w/o bruits; no thyromegaly or nodules palpated; no lymphadenopathy. RESP  Coarse BS w/ no wheeizng  CARD:  RRR, no m/r/g   GI:   Soft & nt; nml bowel sounds; no organomegaly or masses detected. Musco: Warm bil,  no calf tenderness edema, clubbing, pulses intact Neuro: intact w/ no focal deficits noted.    Impression & Recommendations:  Problem # 1:  PULMONARY FIBROSIS, INTERSTITIAL (ICD-516.3)  CT findings on 04/03/2009 of  UIP/Pulmonary Fibrosis secondary to his RA. However, he also had CHF at that time. currently on 05/01/2009   Awaiting pulmoanry rehab.  now off amiodarone.  follow up Dr. Marchelle Gearing in 4 w/ PFTs  Meds reviewed with pt education and computerized med calendar completed- avoid oil based meds for now.     Orders: Est. Patient Level III (45409) Prescription Created Electronically [phone])  Complete Medication List: 1)  Furosemide 40 Mg Tabs (Furosemide) .... Take 1 tablet by mouth two times a day 2)  Digoxin 0.25 Mg Tabs (Digoxin) .... Take 1 tablet by mouth once a day 3)  Amiodarone Hcl 200 Mg Tabs (Amiodarone  hcl) .... Take 1 tablet by mouth once a day 4)  Pradaxa 150 Mg Caps (Dabigatran etexilate mesylate) .... Take 1 tablet by mouth two times a day 5)  Diltiazem Hcl Er Beads 180 Mg Xr24h-cap (Diltiazem hcl er beads) .... Take 1 tablet by mouth once a day 6)  Prilosec 20 Mg Cpdr (Omeprazole) .... Take 1 tablet by mouth once a day 7)  Multivitamins Tabs (Multiple vitamin) .... Take 1 tablet by mouth once a day 8)  Aspir-low 81 Mg Tbec (Aspirin) .... Take 1 tablet by mouth once a day 9)  Ginkoba 40 Mg Tabs (Ginkgo biloba) .... Take 1 tablet by mouth once a day 10)  Chromium Picolinate 500 Mcg Tabs (Chromium picolinate) .... Take 1 tablet by mouth once a day 11)  Vitamin C Cr 500 Mg Cr-tabs (Ascorbic acid) .... Take 1 tablet by mouth once a day 12)  Sm Coral Calcium 1000 (390 Ca) Mg Tabs (Coral calcium) .... Take 1 tablet by mouth once a day 13)  Urinozinc Prostate Formula  .... Take 1 tablet by mouth once a day 14)  Omega-3 350 Mg Caps (Omega-3 fatty acids) .... Take 1 tablet by mouth once a day 15)  Cvs Vitamin D 2000 Unit Caps (Cholecalciferol) .... Take 1 tablet by mouth once a day 16)  Vitamin E 400 Unit Caps (Vitamin e) .... Take 1 tablet by mouth once a day 17)  Lycopene 10 Mg Caps (Lycopene) .... Take 1 tablet by mouth once a day 18)  Folic Acid 800 Mcg Tabs (Folic acid) .... Take 1  tablet by mouth once a day 19)  Glucosamine-chondroitin 1500-1200 Mg/9ml Liqd (Glucosamine-chondroitin) .... Take 1 tablet by mouth two times a day 20)  Vitamin B-12 500 Mcg Tabs (Cyanocobalamin) .... Take 1 tablet by mouth once a day 21)  Gochi Dietary Supplement  .... As directed 22)  Arava 10 Mg Tabs (Leflunomide) .... At bedtime 23)  Neurontin 600 Mg Tabs (Gabapentin) .... Take 1 tab by mouth at bedtime 24)  Prednisone 10 Mg Tabs (Prednisone) .... Take 1 tablet by mouth once a day 25)  Lopressor 50 Mg Tabs (Metoprolol tartrate) .... 1/2 tab daily  Patient Instructions: 1)  Stop oil based meds, vit E, fish oil etc.  2)  Follow med calendar closely and bring to each visit.  3)  I have sent a 1 month refill on Diltiazem, Digoxin, and Prilosec, you need to establish with a primary care doctor if you are not goint back to Dr. Collins Scotland. One suggestion is [facility] on first floor.  4)  follow up Dr. Marchelle Gearing in 4 weeks w/ PFTs  5)  Please contact office for sooner follow up if symptoms do not improve or worsen  Prescriptions: PRILOSEC 20 MG CPDR (OMEPRAZOLE) Take 1 tablet by mouth once a day  #30 x 11   Entered and Authorized by:   Rubye Oaks NP   Signed by:   Rubye Oaks NP on 05/14/2009   Method used:   [facility] to        [facility] [phone][facility] (retail)       [address]       [place], [place]  [address]       Ph: [phone] or [phone]       Fax: [phone]   RxID:   [id number] DILTIAZEM HCL ER BEADS 180 MG XR24H-CAP (DILTIAZEM HCL ER BEADS) Take 1 tablet by  mouth once a day  #30 x 0   Entered and Authorized by:   Rubye Oaks NP   Signed by:   Rubye Oaks NP on 05/14/2009   Method used:   [facility] to        [facility] [phone]* (retail)       [address]       [place], [place]  [address]       Ph: [phone] or [phone]       Fax: [phone]   RxID:    [id number] DIGOXIN 0.25 MG TABS (DIGOXIN) Take 1 tablet by mouth once a day  #30 x 0   Entered and Authorized by:   Rubye Oaks NP   Signed by:   Rubye Oaks NP on 05/14/2009   Method used:   [facility]ically to        [facility] [phone]* (retail)       [address]       [place], [place]  [address]       Ph: [phone] or [phone]       Fax: [phone]   RxID:   [id number]   Appended Document: med calendar update    Clinical Lists Changes  Medications: Added new medication of CALCIUM 500 MG TABS (CALCIUM) Take 1 tablet by mouth two times a day - Signed Changed medication from OMEGA-3 350 MG CAPS (OMEGA-3 FATTY ACIDS) Take 1 tablet by mouth once a day to FOLIC ACID 800 MCG TABS (FOLIC ACID) Take 1 tablet by mouth once a day - Signed Changed medication from FUROSEMIDE 40 MG TABS (FUROSEMIDE) Take 1 tablet by mouth two times a day to FUROSEMIDE 40 MG TABS (FUROSEMIDE) Take 1 tablet by mouth once a day - Signed Removed medication of FOLIC ACID 800 MCG TABS (FOLIC ACID) Take 1 tablet by mouth once a day - Signed Changed medication from PREDNISONE 10 MG TABS (PREDNISONE) Take 1 tablet by mouth once a day to PREDNISONE 5 MG TABS (PREDNISONE) Take 1 tablet by mouth once a day - Signed Changed medication from LOPRESSOR 50 MG TABS (METOPROLOL TARTRATE) 1/2 tab daily to METOPROLOL TARTRATE 25 MG TABS (METOPROLOL TARTRATE) 1/2 tab by mouth two times a day - Signed Changed medication from NEURONTIN 600 MG TABS (GABAPENTIN) Take 1 tab by mouth at bedtime to GABAPENTIN 300 MG CAPS (GABAPENTIN) 2 caps by mouth at bedtime - Signed Added new medication of * CPAP wear at bedtime - Signed Added new medication of MUCINEX DM 30-600 MG XR12H-TAB (DEXTROMETHORPHAN-GUAIFENESIN) Take 1-2 tablets every 12 hours as needed - Signed Removed medication of AMIODARONE HCL 200 MG TABS (AMIODARONE HCL) Take 1 tablet by mouth once a day - Signed Removed medication of  PRADAXA 150 MG CAPS (DABIGATRAN ETEXILATE MESYLATE) Take 1 tablet by mouth two times a day - Signed Removed medication of GINKOBA 40 MG TABS (GINKGO BILOBA) Take 1 tablet by mouth once a day - Signed Removed medication of SM CORAL CALCIUM 1000 (390 CA) MG TABS (CORAL CALCIUM) Take 1 tablet by mouth once a day - Signed Removed medication of [facility] VITAMIN D 2000 UNIT CAPS (CHOLECALCIFEROL) Take 1 tablet by mouth once a day - Signed Removed medication of VITAMIN E 400 UNIT CAPS (VITAMIN E) Take 1 tablet by mouth once a day - Signed Removed medication of GLUCOSAMINE-CHONDROITIN 1500-1200 MG/30ML LIQD (GLUCOSAMINE-CHONDROITIN) Take 1 tablet by  mouth two times a day - Signed Removed medication of * GOCHI DIETARY SUPPLEMENT as directed - Signed

## 2010-02-20 NOTE — Assessment & Plan Note (Signed)
Summary: rov/discuss sleep results/apc   Visit Type:  Follow-up Copy to:  [name], MD Primary Nirvan Laban/Referring Belva Koziel:  Newt Lukes MD - PMD. Dr Marchelle Gearing - Pulmonary, Dr Azzie Roup - Rheum, Dr [name] - Cards, Dr. [name] - sleep  CC:  Pt here for follow up on sleep study. Pt states likes the full mask. Pt state Dr. Donnie Aho started him on a new medication that is causing him to "just feel awful".  History of Present Illness: 77 year old obese male with Pulmonary Fibrosis due to RA,  RA on leflunomide/pred 10mg  and OSA per hx (poor compliance with CPAP), CHF with poor EF. Was admited mid-March 2011 with A Flutter/Fib and CHF exacerbation and needed ICU stay and amiodarone. CT showed evidence of pulmonary fibrosis. Pulmonary advised cautiin with amio. Thereofre, he is s/p cardioversion and ablation with retun to sinus. Cath 04/06/2009 was normal coronaries.   Ov 08/16/2009: Last visit was in May 2011. Our plan at that time was do to CT chest in 6 months as surveillance for his RA related pulmonary fibrosis. Since then he saw Dr. [name] for sleep and has gotten compliant with CPAP. He is attending rehab where their progress notes state he is doing real well without desaturation. He is now here for PRe-op pulmonary clearance prior to right shoulder surgery. sTates he is going to have a 'minor' surgery on shoulder cyst. Does not know what type of anesthesia. Denies decompensation in pulmonary status. SOme baseline minimal dry cough. Today, we walked him and he did not desaturate on room air after walking 185 feet x 3 laps  September 30, 2009 1:50 PM  - SLEEP visit Reviewed pSG - moderate obstructive sleep apnea AHI 22/h, lowest desatn 78%  -cpaP 9 cm with med FF mask required on PSG  Got new mask prior to surgery- likes it ?  persistent REM related desaturation on 9 cm related to underlying cardiopulmonary disease   Preventive Screening-Counseling &  Management  Alcohol-Tobacco     Alcohol drinks/day: 0     Smoking Status: quit > 6 months     Packs/Day: 1/2PPD     Year Started: 1972     Year Quit: 1975     Pack years: 2  Current Medications (verified): 1)  Multivitamins  Tabs (Multiple Vitamin) .... Take 1 Tablet By Mouth Once A Day 2)  Vitamin C Cr 500 Mg Cr-Tabs (Ascorbic Acid) .... Take 1 Tablet By Mouth Once A Day 3)  Lycopene 10 Mg Caps (Lycopene) .... Take 1 Tablet By Mouth Once A Day 4)  Chromium Picolinate 500 Mcg Tabs (Chromium Picolinate) .... Take 1 Tablet By Mouth Once A Day 5)  Calcium 500 Mg Tabs (Calcium) .... Take 1 Tablet By Mouth Two Times A Day 6)  Urinozinc Prostate Formula .... Take 1 Tablet By Mouth Once A Day 7)  Folic Acid 800 Mcg Tabs (Folic Acid) .... Take 1 Tablet By Mouth Once A Day 8)  Vitamin B-12 500 Mcg Tabs (Cyanocobalamin) .... Take 1 Tablet By Mouth Once A Day 9)  Aspir-Low 81 Mg Tbec (Aspirin) .... Take 1 Tablet By Mouth Once A Day 10)  Furosemide 40 Mg Tabs (Furosemide) .... Take 1 Tablet By Mouth Once A Day 11)  Diltiazem Hcl Er Beads 180 Mg Xr24h-Cap (Diltiazem Hcl Er Beads) .... Take 1 Tablet By Mouth Once A Day 12)  Digoxin 0.25 Mg Tabs (Digoxin) .... Take 1 Tablet By Mouth Once A Day 13)  Arava 10 Mg  Tabs (Leflunomide) .... At Bedtime 14)  Metoprolol Tartrate 25 Mg Tabs (Metoprolol Tartrate) .... 1/2 Tab By Mouth Two Times A Day 15)  Gabapentin 300 Mg Caps (Gabapentin) .... 2 Caps By Mouth At Bedtime 16)  Cpap .... Wear At Bedtime 17)  Mucinex Dm 30-600 Mg Xr12h-Tab (Dextromethorphan-Guaifenesin) .... Take 1-2 Tablets Every 12 Hours As Needed 18)  Gochi Juice .... Drink 1 Oz Once Daily 19)  Thigh High Compression Hose .Marland Kitchen.. 1 Pair - Apply Am, Remove At Bedtime Dx - Venous Insufficiency 20)  Allergy Relief 25 Mg Caps (Diphenhydramine Hcl) .... One Capsule By Mouth As Needed 21)  Pradaxa 150 Mg Caps (Dabigatran Etexilate Mesylate) .... Take 1 Tablet By Mouth Two Times A Day  Allergies  (verified): 1)  ! Methotrexate  Past History:  Past Medical History: Last updated: 07/17/2009 Atrial flutter w/ RVR 03/2009 hosp,  s/p ablation = NSR chronic systolic CHF interstitial lung disease. rheumatoid arthritis. History of morbid obesity. neuropathic pain chronic left lower extremity edema Allergic rhinitis Depression Hyperlipidemia Hypertension  MD roster: pulm - ramaswamy cards- Sw[place] rheum - anderson  Social History: Last updated: 07/17/2009 single, lives alone He does not smoke.   He said he used to smoke socially only when he drank alcohol.   He currently denies drinking alcohol.   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  His family contact would be his sister, [name], [phone].   Past Pulmonary History:  Pulmonary History: August 12, 2009 Sleep evaluation He underwent PSG in 2004 & was told that he had severe obstructive sleep apnea . He bought the CPAP from a friend who was not using it. The settings were never changed to his needs ! he has since lost wt from 265 to 235 lbs. Epworth Sleepiness Score remains 15/24. bedtime is 9p, latency 10 mins, afternon naps +, wakes up 3-4 times to urinate , wakes up at 7A feelig tired. There is no history suggestive of cataplexy, sleep paralysis or parasomnias   Review of Systems       The patient complains of dyspnea on exertion.  The patient denies anorexia, fever, weight loss, weight gain, vision loss, decreased hearing, hoarseness, chest pain, syncope, peripheral edema, prolonged cough, headaches, hemoptysis, abdominal pain, melena, hematochezia, severe indigestion/heartburn, hematuria, muscle weakness, difficulty walking, depression, unusual weight change, abnormal bleeding, and enlarged lymph nodes.    Vital Signs:  Patient profile:   77 year old male Height:      68 inches Weight:      233.50 pounds BMI:     35.63 O2 Sat:      95 % on Room air Temp:     98.3 degrees F  oral Pulse rate:   71 / minute BP sitting:   120 / 60  (left arm) Cuff size:   regular  Vitals Entered By: Zackery Barefoot CMA (September 30, 2009 1:42 PM)  O2 Flow:  Room air CC: Pt here for follow up on sleep study. Pt states likes the full mask. Pt state Dr. Donnie Aho started him on a new medication that is causing him to "just feel awful" Comments Medications reviewed with patient Verified contact number and pharmacy with patient Zackery Barefoot CMA  September 30, 2009 1:43 PM    Physical Exam  Additional Exam:  Gen. Pleasant, well-nourished, in no distress, normal affect ENT - no lesions, no post nasal drip, class 2 airway Neck: No JVD, no thyromegaly, no carotid bruits Lungs: no  use of accessory muscles, no dullness to percussion, clear without rales or rhonchi  Cardiovascular: Rhythm regular, heart sounds  normal, no murmurs or gallops, no peripheral edema Musculoskeletal: No deformities, no cyanosis or clubbing      Impression & Recommendations:  Problem # 1:  OBSTRUCTIVE SLEEP APNEA (ICD-780.57) Compliance encouraged, wt loss emphasized, asked to avoid meds with sedative side effects, cautioned against driving when sleepy.  ct CPAP 9 cm  Orders: Est. Patient Level III ([address]) DME Referral (DME)  Problem # 2:  PULMONARY FIBROSIS, INTERSTITIAL (ICD-516.3) REM related desaturation may be related to this & cardiac disease Recheck ONO on CPAP - if significant, will start nocturnal O2 Orders: Est. Patient Level III (60454) DME Referral (DME)  Medications Added to Medication List This Visit: 1)  Pradaxa 150 Mg Caps (Dabigatran etexilate mesylate) .... Take 1 tablet by mouth two times a day  Patient Instructions: 1)  Copy sent to: Dr Felicity Coyer 2)  Please schedule a follow-up appointment in 1 month. 3)  Your machine should be set at 9 cm . We will try to get you a new machine. 4)  We will check your O2 level  on the CPAP machine during sleep in 3-4 weeks

## 2010-02-20 NOTE — Progress Notes (Signed)
Summary: REACTION  Phone Note Call from Patient   Caller: Patient Call For: PARRETT Summary of Call: PT HAVING REACTION LIPS SWOLLEN AND ITCHING [facility] Initial call taken by: Rickard Patience,  May 30, 2009 3:02 PM  Follow-up for Phone Call        The patient c/o severe itching and rash over his entire body since yesterday. He says he was working in the yard yesterday and has had swelling around his ears and upper lip since this morning. He refused an appt to be seen today and says he has no PCP and lives in [place], [place]. The pharmacist had suggested he take Benadryl and get Prednisone. The pt says he is taking no new medications and has not had any new types of food. He denies any SOB or tightness in his throat. I told him to go to the nearest ER if he had any increased sob or the swelling gets worse. Please advise. Follow-up by: Michel Bickers CMA,  May 30, 2009 3:15 PM  Additional Follow-up for Phone Call Additional follow up Details #1::        agree with pharamcist recs for benadryl and medrol dose pack. Herb Grays is listed as his PMD. That is who  I hvae as PMD. If he does not acknowledge her as PMD. Please call his pharmacy and order medrol dose pack. But I am a pulmonary doc and this needs a beteter qualified doc to give this opinion like ER or PMD or derm. So, please let him know he is taking the risk on himself and not to blame me if something goes wrong Additional Follow-up by: Kalman Shan MD,  May 30, 2009 3:45 PM    Additional Follow-up for Phone Call Additional follow up Details #2::    Pt advised that PMD of ER doctor is better qualified to make recs for this situation, that MR is a pulmonary doctor. I asked pt about Herb Grays as his PMD and he states he no longer sees her and has chanegd to Dr. Felicity Coyer, but his first appt with her is not until end of June, sothat is why he called here. He refused to go to ER or Urgent care. I advised him of risks and he states he  understands the risk but wants meds called into pharmacy. I advsied to take benadryl OTC as directed by the box and that I sent in rx for medrol does pak. pt aware. Carron Curie CMA  May 30, 2009 3:55 PM   New/Updated Medications: MEDROL (PAK) 4 MG TABS (METHYLPREDNISOLONE) take as directed Prescriptions: MEDROL (PAK) 4 MG TABS (METHYLPREDNISOLONE) take as directed  #1 pak x 0   Entered by:   Carron Curie CMA   Authorized by:   Kalman Shan MD   Signed by:   Carron Curie CMA on 05/30/2009   Method used:   Electronically to        [facility] [phone]* (retail)       [address]       [place], [place]  [address]       Ph: [phone] or [phone]       Fax: [phone]   RxID:   [id number]

## 2010-02-20 NOTE — Assessment & Plan Note (Signed)
Summary: NEW / MEDICARE / #/ CD   Vital Signs:  Patient profile:   77 year old male Height:      68 inches (172.72 cm) Weight:      232.0 pounds (105.45 kg) O2 Sat:      95 % on Room air Temp:     98.6 degrees F (37.00 degrees C) oral Pulse rate:   66 / minute BP sitting:   122 / 62  (left arm) Cuff size:   large  Vitals Entered By: Orlan Leavens (July 17, 2009 9:46 AM)  O2 Flow:  Room air CC: New patient Is Patient Diabetic? No Pain Assessment Patient in pain? no        Primary Care Provider:  Newt Lukes MD  CC:  New patient.  History of Present Illness: new pt to me and our division - here to est care prev followed with tammy spears for PCP  1) pulm fibrosis - idopathic and ?amio component - follows with pulm for same - deneis SOB or DOE at this time - reports compliance with meds - no O2 needs  2) A fib/flutter - hosp with same and exac CHF 03/2009 - s/p cardioversion and ablastion 03/2009 = sinus maintained, off amio due to above - denies palp or edema changes - compliants with meds as rx'd and follows with dr. [place] - initially on pradaxa but no anticoag rec at this time  3) RA - follows with rheum for same - on arava since start of 2011, denies adv effects from same, pain and mobility much improved -   4) OSA - intermittent CPAP use - believes weight loss which has occured since 03/2009 hosp has helped his OSA symptoms so CPAP "not necessary most of the time"  5) chronic neuropathic pain - burning and numbness affects hands and feet, no recent change in symptoms - controlled with gabapentin - needs refill  Preventive Screening-Counseling & Management  Alcohol-Tobacco     Alcohol drinks/day: 0     Alcohol Counseling: not indicated; patient does not drink     Smoking Status: quit     Tobacco Counseling: to remain off tobacco products  Caffeine-Diet-Exercise     Diet Counseling: not indicated; diet is assessed to be healthy     Does Patient Exercise: yes    Times/week: 4     Exercise Counseling: not indicated; exercise is adequate     Depression Counseling: not indicated; screening negative for depression  Safety-Violence-Falls     Seat Belt Use: yes     Seat Belt Counseling: not indicated; patient wears seat belts     Helmet Counseling: not indicated; patient wears helmet when riding bicycle/motocycle     Firearms in the Home: no firearms in the home     Firearm Counseling: not applicable     Smoke Detectors: yes     Smoke Detector Counseling: n/a     Violence in the Home: no risk noted     Violence Counseling: not indicated; no violence risk noted     Fall Risk Counseling: not indicated; no significant falls noted  Clinical Review Panels:  Immunizations   Last Pneumovax:  Pneumovax (02/19/2006)   -  Date:  04/09/2009    BG Random: 136    BUN: 19    Creatinine: 0.82    Sodium: 141    Potassium: 3.7    Chloride: 102    CO2 Total: 33    Calcium: 8.1    WBC:  14.3    HGB: 11.0    HCT: 33.4    RBC: 3.36    PLT: 225    MCV: 99.4    RDW: 16.1    SGOT (AST): 18    SGPT (ALT): 31    T. Bilirubin: 0.7    Alk Phos: 35    Total Protein: 5.1    Albumin: 2.8    HgbA1c: 5.4    TSH: 1.214 3  Current Medications (verified): 1)  Multivitamins  Tabs (Multiple Vitamin) .... Take 1 Tablet By Mouth Once A Day 2)  Vitamin C Cr 500 Mg Cr-Tabs (Ascorbic Acid) .... Take 1 Tablet By Mouth Once A Day 3)  Lycopene 10 Mg Caps (Lycopene) .... Take 1 Tablet By Mouth Once A Day 4)  Chromium Picolinate 500 Mcg Tabs (Chromium Picolinate) .... Take 1 Tablet By Mouth Once A Day 5)  Calcium 500 Mg Tabs (Calcium) .... Take 1 Tablet By Mouth Two Times A Day 6)  Urinozinc Prostate Formula .... Take 1 Tablet By Mouth Once A Day 7)  Folic Acid 800 Mcg Tabs (Folic Acid) .... Take 1 Tablet By Mouth Once A Day 8)  Vitamin B-12 500 Mcg Tabs (Cyanocobalamin) .... Take 1 Tablet By Mouth Once A Day 9)  Aspir-Low 81 Mg Tbec (Aspirin) .... Take 1 Tablet By  Mouth Once A Day 10)  Furosemide 40 Mg Tabs (Furosemide) .... Take 1 Tablet By Mouth Once A Day 11)  Diltiazem Hcl Er Beads 180 Mg Xr24h-Cap (Diltiazem Hcl Er Beads) .... Take 1 Tablet By Mouth Once A Day 12)  Digoxin 0.25 Mg Tabs (Digoxin) .... Take 1 Tablet By Mouth Once A Day 13)  Arava 10 Mg Tabs (Leflunomide) .... At Bedtime 14)  Prilosec 20 Mg Cpdr (Omeprazole) .... Take 1 Tablet By Mouth Once A Day 15)  Metoprolol Tartrate 25 Mg Tabs (Metoprolol Tartrate) .... 1/2 Tab By Mouth Two Times A Day 16)  Gabapentin 300 Mg Caps (Gabapentin) .... 2 Caps By Mouth At Bedtime 17)  Cpap .... Wear At Bedtime 18)  Mucinex Dm 30-600 Mg Xr12h-Tab (Dextromethorphan-Guaifenesin) .... Take 1-2 Tablets Every 12 Hours As Needed 19)  Gochi Juice .... Drink 1 Oz Once Daily  Allergies (verified): 1)  ! Methotrexate  Past History:  Past Medical History: Atrial flutter w/ RVR 03/2009 hosp,  s/p ablation = NSR chronic systolic CHF interstitial lung disease. rheumatoid arthritis. History of morbid obesity. neuropathic pain chronic left lower extremity edema Allergic rhinitis Depression Hyperlipidemia Hypertension  MD roster: pulm - ramaswamy cards- [place] rheum Dareen Piano  Family History: Mom was deceased at the age of 68, nerve  problems.  Father was deceased at the age of 32 - myocardial infarction  Social History: single, lives alone He does not smoke.   He said he used to smoke socially only when he drank alcohol.   He currently denies drinking alcohol.   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  His family contact would be his sister, [name], [phone]. Smoking Status:  quit Does Patient Exercise:  yes Seat Belt Use:  yes  Review of Systems       see HPI above. I have reviewed all other systems and they were negative.   Physical Exam  General:  alert, well-developed, well-nourished, and cooperative to examination.    Eyes:  vision grossly intact;  pupils equal, round and reactive to light.  conjunctiva and lids normal.   "lazy" left eye Ears:  normal pinnae bilaterally, without erythema, swelling, or tenderness to palpation. TMs clear, without effusion, or cerumen impaction. Hearing grossly normal bilaterally  Mouth:  teeth and gums in good repair; mucous membranes moist, without lesions or ulcers. oropharynx clear without exudate, no erythema.  Neck:  supple, full ROM, no masses, no thyromegaly; no thyroid nodules or tenderness. no JVD or carotid bruits.   Lungs:  normal respiratory effort, no intercostal retractions or use of accessory muscles; normal breath sounds bilaterally - no crackles and no wheezes.    Heart:  normal rate, regular rhythm, no murmur, and no rub. RLE without edema, LLE with chronic 1+ edema (wearing compression hose for same). normal DP pulses and normal cap refill in all 4 extremities    Abdomen:  soft, non-tender, normal bowel sounds, no distention; no masses and no appreciable hepatomegaly or splenomegaly.   Msk:  No deformity or scoliosis noted of thoracic or lumbar spine.   Neurologic:  alert & oriented X3 and cranial nerves II-XII symetrically intact.  strength normal in all extremities, sensation intact to light touch, and gait normal. speech fluent without dysarthria or aphasia; follows commands with good comprehension.  Skin:  no rashes, vesicles, ulcers, or erythema. No nodules or irregularity to palpation.  Psych:  Oriented X3, memory intact for recent and remote, normally interactive, good eye contact, not anxious appearing, not depressed appearing, and not agitated.      Impression & Recommendations:  Problem # 1:  ATRIAL FLUTTER (ICD-427.32) hosp 03/2009 dc summary reviewed - assoc with a/c syst chf - s/p DCCV and ablation - maintaining NSR now off amio and anticoag - will send to cards for records of mgmt since hosp dc -  currently asymptomatic no med changes rec -  His updated medication list for  this problem includes:    Aspir-low 81 Mg Tbec (Aspirin) .Marland Kitchen... Take 1 tablet by mouth once a day    Diltiazem Hcl Er Beads 180 Mg Xr24h-cap (Diltiazem hcl er beads) .Marland Kitchen... Take 1 tablet by mouth once a day    Digoxin 0.25 Mg Tabs (Digoxin) .Marland Kitchen... Take 1 tablet by mouth once a day    Metoprolol Tartrate 25 Mg Tabs (Metoprolol tartrate) .Marland Kitchen... 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  However, he also had CHF at that time.  now off amiodarone.  continue cardio-pulm rehab as ongoing and f/u with pulm as ongoing  Problem # 6:  OBSTRUCTIVE SLEEP APNEA (ICD-780.57)  non compliant with CPAP. symptoms improved with weight loss  Problem # 7:  CHRONIC SYSTOLIC HEART FAILURE (ICD-428.22) symptoms 03/2009 hosp in setting for AF with RVR -  LVEF 25% by echo then - send to cards for records of f/u since - compensated on exam - cont current meds His updated medication list for this problem includes:    Aspir-low 81 Mg Tbec (Aspirin) .Marland Kitchen... Take 1 tablet by mouth  once a day    Furosemide 40 Mg Tabs (Furosemide) .Marland Kitchen... Take 1 tablet by mouth once a day    Digoxin 0.25 Mg Tabs (Digoxin) .Marland Kitchen... Take 1 tablet by mouth once a day    Metoprolol Tartrate 25 Mg Tabs (Metoprolol tartrate) .Marland Kitchen... 1/2 tab by mouth two times a day  Complete Medication List: 1)  Multivitamins Tabs (Multiple vitamin) .... Take 1  tablet by mouth once a day 2)  Vitamin C Cr 500 Mg Cr-tabs (Ascorbic acid) .... Take 1 tablet by mouth once a day 3)  Lycopene 10 Mg Caps (Lycopene) .... Take 1 tablet by mouth once a day 4)  Chromium Picolinate 500 Mcg Tabs (Chromium picolinate) .... Take 1 tablet by mouth once a day 5)  Calcium 500 Mg Tabs (Calcium) .... Take 1 tablet by mouth two times a day 6)  Urinozinc Prostate Formula  .... Take 1 tablet by mouth once a day 7)  Folic Acid 800 Mcg Tabs (Folic acid) .... Take 1 tablet by mouth once a day 8)  Vitamin B-12 500 Mcg Tabs (Cyanocobalamin) .... Take 1 tablet by mouth once a day 9)  Aspir-low 81 Mg Tbec (Aspirin) .... Take 1 tablet by mouth once a day 10)  Furosemide 40 Mg Tabs (Furosemide) .... Take 1 tablet by mouth once a day 11)  Diltiazem Hcl Er Beads 180 Mg Xr24h-cap (Diltiazem hcl er beads) .... Take 1 tablet by mouth once a day 12)  Digoxin 0.25 Mg Tabs (Digoxin) .... Take 1 tablet by mouth once a day 13)  Arava 10 Mg Tabs (Leflunomide) .... At bedtime 14)  Prilosec 20 Mg Cpdr (Omeprazole) .... Take 1 tablet by mouth once a day 15)  Metoprolol Tartrate 25 Mg Tabs (Metoprolol tartrate) .... 1/2 tab by mouth two times a day 16)  Gabapentin 300 Mg Caps (Gabapentin) .... 2 caps by mouth at bedtime 17)  Cpap  .... Wear at bedtime 18)  Mucinex Dm 30-600 Mg Xr12h-tab (Dextromethorphan-guaifenesin) .... Take 1-2 tablets every 12 hours as needed 19)  Gochi Juice  .... Drink 1 oz once daily 20)  Thigh High Compression Hose  .Marland Kitchen.. 1 pair - apply am, remove at bedtime dx - venous insufficiency  Other Orders: Gastroenterology Referral (GI)  Patient Instructions: 1)  it was good to see you today. 2)  medical history and medications reviewed today including your 03/2009 hospitalization - 3)  will send for records and labs from dr. [name] and dr. Elvis Coil office to review - 4)  refill on gabapentin and new presciption for compression hose as discussed - 5)  we'll make referral  for screening colonoscopy. Our office will contact you regarding this appointment once made.  6)  Please schedule a follow-up appointment in 6 months, sooner if problems.  Prescriptions: THIGH HIGH COMPRESSION HOSE 1 pair - apply AM, remove at bedtime dx - venous insufficiency  #1 x 0   Entered and Authorized by:   Newt Lukes MD   Signed by:   Newt Lukes MD on 07/17/2009   Method used:   Print then Give to Patient   RxID:   [id number] GABAPENTIN 300 MG CAPS (GABAPENTIN) 2 caps by mouth at bedtime  #60 x 6   Entered and Authorized by:   Newt Lukes MD   Signed by:   Newt Lukes MD on 07/17/2009   Method used:   Electronically to        [facility] [phone]* (  retail)       [address]       [place], [place]  [address]       Ph: [phone] or [phone]       Fax: [phone]   RxID:   [id number]

## 2010-02-20 NOTE — Letter (Signed)
Summary: [facility]   Imported By: Sherian Rein 08/02/2009 08:22:17  _____________________________________________________________________  External Attachment:    Type:   Image     Comment:   External Document

## 2010-02-20 NOTE — Assessment & Plan Note (Signed)
Summary: hospital follow up/ mbw   Visit Type:  Hospital Follow-up Primary Provider/Referring Provider:  [name], [facility], Dr Marchelle Gearing - Pulmonary, Dr. Azzie Roup - Rheum, Dr. [place] - Cardiology  CC:  HFU. Pt c/o sob with exertion. [name]  History of Present Illness: OV 05/01/2009: 77 year old obese male with OSA, RA on leflunomide/pred 10mg  and OSA per hx (poor compliance with CPAP), CHF with poor EF. Was admited mid-March 2011 with A Flutter/Fib and CHF exacerbation and needed ICU stay and amiodarone. CT showed evidence of pulmonary fibrosis. Pulmonary advised cautiin with amio. Thereofre, he is s/p cardioversion and ablation with retun to sinus. Cath 04/06/2009 was normal coronaries. Now following up. Dyspnea has improved. Doing ADLs. He did not desaturate with exertion. Semicompliant with CPAP.   Of note, he has a DUFFLE BAG full of medicines. Many are OTC vitamins. HE states he is taking all of them..  Current Medications (verified): 1)  Furosemide 40 Mg Tabs (Furosemide) .... Take 1 Tablet By Mouth Two Times A Day 2)  Digoxin 0.25 Mg Tabs (Digoxin) .... Take 1 Tablet By Mouth Once A Day 3)  Amiodarone Hcl 200 Mg Tabs (Amiodarone Hcl) .... Take 1 Tablet By Mouth Once A Day 4)  Pradaxa 150 Mg Caps (Dabigatran Etexilate Mesylate) .... Take 1 Tablet By Mouth Two Times A Day 5)  Diltiazem Hcl Er Beads 180 Mg Xr24h-Cap (Diltiazem Hcl Er Beads) .... Take 1 Tablet By Mouth Once A Day 6)  Prilosec 20 Mg Cpdr (Omeprazole) .... Take 1 Tablet By Mouth Once A Day 7)  Multivitamins  Tabs (Multiple Vitamin) .... Take 1 Tablet By Mouth Once A Day 8)  Aspir-Low 81 Mg Tbec (Aspirin) .... Take 1 Tablet By Mouth Once A Day 9)  Ginkoba 40 Mg Tabs (Ginkgo Biloba) .... Take 1 Tablet By Mouth Once A Day 10)  Chromium Picolinate 500 Mcg Tabs (Chromium Picolinate) .... Take 1 Tablet By Mouth Once A Day 11)  Vitamin C Cr 500 Mg Cr-Tabs (Ascorbic Acid) .... Take 1 Tablet By Mouth Once A  Day 12)  Sm Coral Calcium 1000 (390 Ca) Mg Tabs (Coral Calcium) .... Take 1 Tablet By Mouth Once A Day 13)  Urinozinc Prostate Formula .... Take 1 Tablet By Mouth Once A Day 14)  Omega-3 350 Mg Caps (Omega-3 Fatty Acids) .... Take 1 Tablet By Mouth Once A Day 15)  Cvs Vitamin D 2000 Unit Caps (Cholecalciferol) .... Take 1 Tablet By Mouth Once A Day 16)  Vitamin E 400 Unit Caps (Vitamin E) .... Take 1 Tablet By Mouth Once A Day 17)  Lycopene 10 Mg Caps (Lycopene) .... Take 1 Tablet By Mouth Once A Day 18)  Folic Acid 800 Mcg Tabs (Folic Acid) .... Take 1 Tablet By Mouth Once A Day 19)  Glucosamine-Chondroitin 1500-1200 Mg/56ml Liqd (Glucosamine-Chondroitin) .... Take 1 Tablet By Mouth Two Times A Day 20)  Vitamin B-12 500 Mcg Tabs (Cyanocobalamin) .... Take 1 Tablet By Mouth Once A Day 21)  Gochi Dietary Supplement .... As Directed 22)  Arava 10 Mg Tabs (Leflunomide) .... At Bedtime 23)  Neurontin 600 Mg Tabs (Gabapentin) .... Take 1 Tab By Mouth At Bedtime 24)  Prednisone 10 Mg Tabs (Prednisone) .... Take 1 Tablet By Mouth Once A Day 25)  Lopressor 50 Mg Tabs (Metoprolol Tartrate) .... 1/2 Tab Daily  Allergies (verified): No Known Drug Allergies  Past History:  Past Medical History: Last updated: 04/30/2009 Atrial flutter with rapid ventricular response, status post  ablation, currently heart rate controlled on amiodarone. 2. Pulmonary edema due to acute decompensation of systolic congestive     heart failure. 3. Transient hematuria from heparin. 4. Respiratory distress due to pulmonary edema. 5. Escherichia coli urinary tract infection. 6. History of interstitial lung disease. 7. History of rheumatoid arthritis. 8 .History of morbid obesity. 9. History of neuropathic pain. 10. History of chronic left lower extremity edema.    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       Family History: Last updated: 05-03-2009 Mom was deceased at the age of 64 and had nerve   problems.   Father was deceased at the age of 57 with a history of   myocardial infarction that was massive.   Social History: Last updated: 2009-05-03 He does not smoke.  He said he used to smoke socially   only when he drank alcohol.  He currently denies drinking alcohol.  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  His family contact would be his sister, [name], [phone].   Review of Systems       The patient complains of shortness of breath with activity, non-productive cough, irregular heartbeats, acid heartburn, and indigestion.  The patient denies shortness of breath at rest, productive cough, coughing up blood, chest pain, loss of appetite, weight change, abdominal pain, difficulty swallowing, sore throat, tooth/dental problems, headaches, nasal congestion/difficulty breathing through nose, sneezing, itching, ear ache, anxiety, depression, hand/feet swelling, joint stiffness or pain, rash, change in color of mucus, and fever.    Vital Signs:  Patient profile:   77 year old male Height:      68 inches Weight:      252.13 pounds BMI:     38.47 O2 Sat:      94 % on Room air Temp:     97.7 degrees F oral Pulse rate:   71 / minute BP sitting:   158 / 90  (right arm) Cuff size:   regular  Vitals Entered By: Carron Curie CMA (May 01, 2009 8:57 AM)  O2 Flow:  Room air CC: HFU. Pt c/o sob with exertion.  Comments Medications reviewed with patient Carron Curie CMA  May 01, 2009 8:57 AM Daytime phone number verified with patient. Ambulatory Pulse Oximetry  Resting; HR_69____    02 Sat_95____  Lap1 (185 feet)   HR__94___   02 Sat_93____ Lap2 (185 feet)   HR__107___   02 Sat_94____    Lap3 (185 feet)   HR__102___   02 Sat_94___  _X__Test Completed without Difficulty ___Test Stopped due to: [name], CMA  May 01, 2009 9:44  AM     Physical Exam  General:  obese.   Head:  normocephalic and atraumatic Eyes:  PERRLA/EOM intact; conjunctiva and sclera clear Ears:  TMs intact and clear with normal canals Nose:  no deformity, discharge, inflammation, or lesions Mouth:  no deformity or lesions Neck:  no masses, thyromegaly, or abnormal cervical nodes Chest Wall:  no deformities noted Lungs:  clear bilaterally to auscultation and percussion Heart:  regular rate and rhythm, S1, S2 without murmurs, rubs, gallops, or clicks Abdomen:  bowel sounds positive; abdomen soft and non-tender without masses, or organomegaly Msk:  no deformity or scoliosis noted with normal posture Pulses:  pulses normal Extremities:  no clubbing, cyanosis, edema, or deformity noted Neurologic:  CN II-XII grossly  intact with normal reflexes, coordination, muscle strength and tone Skin:  intact without lesions or rashes Cervical Nodes:  no significant adenopathy Axillary Nodes:  no significant adenopathy Psych:  alert and cooperative; normal mood and affect; normal attention span and concentration   CT of Chest  Procedure date:  04/03/2009  Findings:       IMPRESSION:    1.  Interstitial lung disease with subpleural fibrosis.   2.  Scattered bilateral ground-glass opacities may reflect   pulmonary edema and/or  pneumonitis (usual interstitial   pneumonitis).   3.  Intralobular septal thickening, bilateral pleural effusions,   and cardiomegaly are most consistent with congestive heart failure.   4.  Mediastinal lymphadenopathy.  This is nonspecific, and may be   reactive, and can be seen in the setting of pulmonary fibrosis.   Follow-up chest imaging could be considered to evaluate for   interval change of the lymph nodes.   5.  Aortic valvular calcifications.  This can be seen in the   setting of aortic stenosis.   6.  Coronary artery atherosclerosis.    Read By:  Oliver Hum,  M.D.   Released By:  Oliver Hum,   M.D.  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 However, he also had CHF at that time. currently on 05/01/2009 he is clinically euvolemic and did not desaturate with exertion. The key issues ahead of [place] are a) polypharmacy and b) amiodarone that he still on. I will have him see my NP to get a med calendar done as first step in eliminiating polypharmacy. NExt, I will send this note to Dr. [place] to see if he can come off amio esp becuase he is in sinus now. I will also set him up for pulmonary rehab at [facility]. At next visit we will get PFTs and will consder followup CT at some point in next 3-12 months  Orders: New Patient Level IV (16109) Rehabilitation Referral (Rehab)  Problem # 2:  OBSTRUCTIVE SLEEP APNEA (ICD-780.57) Assessment: New  non compliant with CPAP. I will refer him to sleep doc at some poiint during followup  Orders: New Patient Level IV (60454)  Medications Added to Medication List This Visit: 1)  Furosemide 40 Mg Tabs (Furosemide) .... Take 1 tablet by mouth two times a day 2)  Digoxin 0.25 Mg Tabs (Digoxin) .... Take 1 tablet by mouth once a day 3)  Amiodarone Hcl 200 Mg Tabs (Amiodarone hcl) .... Take 1 tablet by mouth once a day 4)  Pradaxa 150 Mg Caps (Dabigatran etexilate mesylate) .... Take 1 tablet by mouth two times a day 5)  Diltiazem Hcl Er Beads 180 Mg Xr24h-cap (Diltiazem hcl er beads) .... Take 1 tablet by mouth once a day 6)  Prilosec 20 Mg Cpdr (Omeprazole) .... Take 1 tablet by mouth once a day 7)  Multivitamins Tabs (Multiple vitamin) .... Take 1 tablet by mouth once a day 8)  Aspir-low 81 Mg Tbec (Aspirin) .... Take 1 tablet by mouth once a day 9)  Ginkoba 40 Mg Tabs (Ginkgo biloba) .... Take 1 tablet by mouth once a day 10)  Chromium Picolinate 500 Mcg Tabs (Chromium picolinate) .... Take 1 tablet  by mouth once a day 11)  Vitamin C Cr 500 Mg Cr-tabs (Ascorbic acid) .... Take 1 tablet by mouth once a day 12)  Sm  Coral Calcium 1000 (390 Ca) Mg Tabs (Coral calcium) .... Take 1 tablet by mouth once a day 13)  Urinozinc Prostate Formula  .... Take 1 tablet by mouth once a day 14)  Omega-3 350 Mg Caps (Omega-3 fatty acids) .... Take 1 tablet by mouth once a day 15)  Cvs Vitamin D 2000 Unit Caps (Cholecalciferol) .... Take 1 tablet by mouth once a day 16)  Vitamin E 400 Unit Caps (Vitamin e) .... Take 1 tablet by mouth once a day 17)  Lycopene 10 Mg Caps (Lycopene) .... Take 1 tablet by mouth once a day 18)  Folic Acid 800 Mcg Tabs (Folic acid) .... Take 1 tablet by mouth once a day 19)  Glucosamine-chondroitin 1500-1200 Mg/31ml Liqd (Glucosamine-chondroitin) .... Take 1 tablet by mouth two times a day 20)  Vitamin B-12 500 Mcg Tabs (Cyanocobalamin) .... Take 1 tablet by mouth once a day 21)  Gochi Dietary Supplement  .... As directed 22)  Arava 10 Mg Tabs (Leflunomide) .... At bedtime 23)  Neurontin 600 Mg Tabs (Gabapentin) .... Take 1 tab by mouth at bedtime 24)  Prednisone 10 Mg Tabs (Prednisone) .... Take 1 tablet by mouth once a day 25)  Lopressor 50 Mg Tabs (Metoprolol tartrate) .... 1/2 tab daily  Patient Instructions: 1)  we will walk you for oxygen levels 2)  see my nurse Tammy in few weeks 3)  come with your medicine duffle bag and see her for med calendar 4)  then return to see me in 3 months  5)  will do full breathing test befor you return 6)  talk to Dr. Swazil[place] about continued need for amiodarone 7)  'set up pulm rehab at [facility] History:  Pneumovax Immunization History:    Pneumovax:  pneumovax (02/19/2006)

## 2010-02-20 NOTE — Miscellaneous (Signed)
Summary: D/C summary/Cardiac Rehab/[facility]  D/C summary/Cardiac Rehab/[facility]   Imported By: Lester  09/27/2009 08:53:06  _____________________________________________________________________  External Attachment:    Type:   Image     Comment:   External Document

## 2010-02-20 NOTE — Letter (Signed)
Summary: [facility]   Imported By: Sherian Rein 10/16/2009 11:55:41  _____________________________________________________________________  External Attachment:    Type:   Image     Comment:   External Document

## 2010-02-20 NOTE — Progress Notes (Signed)
Summary: pt in lobby---confused about meds  Phone Note Call from Patient Call back at Home Phone [phone]   Caller: Patient Call For: tammy/[email or web address] Reason for Call: Talk to Nurse Summary of Call:   Patient says he is completely out of his meds, he has an appt w/ Tammy on Wed. for med cal, but says he can not wait until then.  Patient is confused about his medications and what to take.  He feels like he has been taking them wrong and that is why he has run out. Patient is in lobby requesting to speak with nurse about this or to be seen.  Patient is in lobby. Initial call taken by: Lehman Prom,  May 13, 2009 12:16 PM  Follow-up for Phone Call        appt scheduled for pt to see tp tomorrow at 11am pt was out of meds but very confused about what he should be taking, told pt to bring all bottles with him to appt Follow-up by: Philipp Deputy CMA,  May 13, 2009 1:14 PM

## 2010-02-20 NOTE — Assessment & Plan Note (Signed)
Summary: surgery clearance / cj   Visit Type:  Follow-up Primary Provider/Referring Provider:  Newt Lukes MD - PMD. Dr Marchelle Gearing - Pulmonary, Dr Azzie Roup - Rheum, Dr [name] - Cards, Dr. [name] - sleep  CC:  SUREGERY CLEARANCE, Pt states breathing is better than usual, when wake up in morning a productive coough with white phlem occasionally, and SOB with exertion and at rest occasionally.  History of Present Illness: 77 year old obese male with Pulmonary Fibrosis due to RA,  RA on leflunomide/pred 10mg  and OSA per hx (poor compliance with CPAP), CHF with poor EF. Was admited mid-March 2011 with A Flutter/Fib and CHF exacerbation and needed ICU stay and amiodarone. CT showed evidence of pulmonary fibrosis. Pulmonary advised cautiin with amio. Thereofre, he is s/p cardioversion and ablation with retun to sinus. Cath 04/06/2009 was normal coronaries.   Ov 08/16/2009: Last visit was in May 2011. Our plan at that time was do to CT chest in 6 months as surveillance for his RA related pulmonary fibrosis. Since then he saw Dr. [name] for sleep and has gotten compliant with CPAP. He is attending rehab where their progress notes state he is doing real well without desaturation. He is now here for PRe-op pulmonary clearance prior to right shoulder surgery. sTates he is going to have a 'minor' surgery on shoulder cyst. Does not know what type of anesthesia. Denies decompensation in pulmonary status. SOme baseline minimal dry cough. Today, we walked him and he did not desaturate on room air after walking 185 feet x 3 laps   Preventive Screening-Counseling & Management  Alcohol-Tobacco     Alcohol drinks/day: 0     Alcohol Counseling: not indicated; patient does not drink     Smoking Status: quit > 6 months     Packs/Day: 1/2PPD     Year Started: 1972     Year Quit: 1975     Pack years: 2     Tobacco Counseling: to remain off tobacco products  Caffeine-Diet-Exercise     Diet Counseling:  not indicated; diet is assessed to be healthy     Does Patient Exercise: yes     Times/week: 4     Exercise Counseling: not indicated; exercise is adequate     Depression Counseling: not indicated; screening negative for depression  Current Medications (verified): 1)  Multivitamins  Tabs (Multiple Vitamin) .... Take 1 Tablet By Mouth Once A Day 2)  Vitamin C Cr 500 Mg Cr-Tabs (Ascorbic Acid) .... Take 1 Tablet By Mouth Once A Day 3)  Lycopene 10 Mg Caps (Lycopene) .... Take 1 Tablet By Mouth Once A Day 4)  Chromium Picolinate 500 Mcg Tabs (Chromium Picolinate) .... Take 1 Tablet By Mouth Once A Day 5)  Calcium 500 Mg Tabs (Calcium) .... Take 1 Tablet By Mouth Two Times A Day 6)  Urinozinc Prostate Formula .... Take 1 Tablet By Mouth Once A Day 7)  Folic Acid 800 Mcg Tabs (Folic Acid) .... Take 1 Tablet By Mouth Once A Day 8)  Vitamin B-12 500 Mcg Tabs (Cyanocobalamin) .... Take 1 Tablet By Mouth Once A Day 9)  Aspir-Low 81 Mg Tbec (Aspirin) .... Take 1 Tablet By Mouth Once A Day 10)  Furosemide 40 Mg Tabs (Furosemide) .... Take 1 Tablet By Mouth Once A Day 11)  Diltiazem Hcl Er Beads 180 Mg Xr24h-Cap (Diltiazem Hcl Er Beads) .... Take 1 Tablet By Mouth Once A Day 12)  Digoxin 0.25 Mg Tabs (Digoxin) .... Take  1 Tablet By Mouth Once A Day 13)  Arava 10 Mg Tabs (Leflunomide) .... At Bedtime 14)  Prilosec 20 Mg Cpdr (Omeprazole) .... Take 1 Tablet By Mouth Once A Day 15)  Metoprolol Tartrate 25 Mg Tabs (Metoprolol Tartrate) .... 1/2 Tab By Mouth Two Times A Day 16)  Gabapentin 300 Mg Caps (Gabapentin) .... 2 Caps By Mouth At Bedtime 17)  Cpap .... Wear At Bedtime 18)  Mucinex Dm 30-600 Mg Xr12h-Tab (Dextromethorphan-Guaifenesin) .... Take 1-2 Tablets Every 12 Hours As Needed 19)  Gochi Juice .... Drink 1 Oz Once Daily 20)  Thigh High Compression Hose .Marland Kitchen.. 1 Pair - Apply Am, Remove At Bedtime Dx - Venous Insufficiency  Allergies: 1)  ! Methotrexate  Past History:  Family History: Last  updated: August 02, 2009 Mom was deceased at the age of 54, nerve  problems.  Father was deceased at the age of 51 - myocardial infarction  Social History: Last updated: 2009-08-02 single, lives alone He does not smoke.   He said he used to smoke socially only when he drank alcohol.   He currently denies drinking alcohol.   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  His family contact would be his sister, [name], [phone].   Risk Factors: Alcohol Use: 0 (08/16/2009) Exercise: yes (08/16/2009)  Risk Factors: Smoking Status: quit > 6 months (08/16/2009) Packs/Day: 1/2PPD (08/16/2009)  Past Medical History: Reviewed history from 02-Aug-2009 and no changes required. Atrial flutter w/ RVR 03/2009 hosp,  s/p ablation = NSR chronic systolic CHF interstitial lung disease. rheumatoid arthritis. History of morbid obesity. neuropathic pain chronic left lower extremity edema Allergic rhinitis Depression Hyperlipidemia Hypertension  MD roster: pulm - [place] cards- [place] rheum Dareen Piano  Past Surgical History: Reviewed history from 04/30/2009 and no changes required.  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      Past Pulmonary History:  Pulmonary History: August 12, 2009 Sleep evaluation He underwent PSG in 2004 & was told that he had severe obstructive sleep apnea . He bought the CPAP from a friend who was not using it. The settings were never changed to his needs ! he has since lost wt from 2655 to 235 lbs. Epworth Sleepiness Score remains 15/24. bedtime is 9p, latency 10 mins, afternon naps +, wakes up 3-4 times to urinate , wakes up at 7A feelig tired. There is no history suggestive of cataplexy, sleep paralysis or parasomnias   Family History: Reviewed history from Aug 02, 2009 and no changes required. Mom was deceased at the age of 54, nerve  problems.  Father was deceased at the age of 5 -  myocardial infarction  Social History: Reviewed history from 02-Aug-2009 and no changes required. single, lives alone He does not smoke.   He said he used to smoke socially only when he drank alcohol.   He currently denies drinking alcohol.   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  His family contact would be his sister, [name], [phone]. Packs/Day:  1/2PPD Pack years:  2  Review of Systems       The patient complains of shortness of breath with activity, shortness of breath at rest, non-productive cough, hand/feet swelling, and joint stiffness or pain.  The patient denies productive cough, coughing up blood, chest pain, irregular heartbeats, acid heartburn, indigestion, loss of appetite, weight change, abdominal pain, difficulty swallowing, sore throat, tooth/dental  problems, headaches, nasal congestion/difficulty breathing through nose, sneezing, itching, ear ache, anxiety, depression, rash, change in color of mucus, and fever.    Vital Signs:  Patient profile:   77 year old male Height:      68 inches Weight:      233.6 pounds BMI:     35.65 O2 Sat:      95 % on Room air Temp:     98.4 degrees F oral Pulse rate:   93 / minute BP sitting:   116 / 78  (left arm) Cuff size:   large  Vitals Entered By: Carver Fila (August 16, 2009 2:12 PM)  O2 Flow:  Room air  Serial Vital Signs/Assessments:  Comments: Ambulatory Pulse Oximetry  Resting; HR_95____    02 Sat_93%RA____  Lap1 (185 feet)   HR_103____   02 Sat_94%RA____ Lap2 (185 feet)   HR__108___   02 Sat__94%RA___    Lap3 (185 feet)   HR_106____   02 Sat__93%RA___  _X__Test Completed without Difficulty ___Test Stopped due to:  Carver Fila  August 16, 2009 2:26 PM    By: Carver Fila   CC: SUREGERY CLEARANCE, Pt states breathing is better than usual, when wake up in morning a productive coough with white phlem occasionally, SOB with exertion and at rest occasionally Is Patient Diabetic?  No Comments meds and allergies updated Phone number updated Carver Fila  August 16, 2009 2:18 PM    Physical Exam  General:  obese.   Head:  normocephalic and atraumatic Eyes:  PERRLA/EOM intact; conjunctiva and sclera clear Ears:  TMs intact and clear with normal canals Nose:  no deformity, discharge, inflammation, or lesions Mouth:  no deformity or lesions Neck:  no masses, thyromegaly, or abnormal cervical nodes Chest Wall:  no deformities noted Lungs:  decreased BS bilateral.   some basal crackles bilaterally + Heart:  regular rate and rhythm, S1, S2 without murmurs, rubs, gallops, or clicks Abdomen:  bowel sounds positive; abdomen soft and non-tender without masses, or organomegaly Msk:  no deformity or scoliosis noted with normal posture Pulses:  pulses normal Extremities:  no clubbing, cyanosis, edema, or deformity noted Neurologic:  CN II-XII grossly intact with normal reflexes, coordination, muscle strength and tone Skin:  intact without lesions or rashes Cervical Nodes:  no significant adenopathy Axillary Nodes:  no significant adenopathy Psych:  alert and cooperative; normal mood and affect; normal attention span and concentration   Impression & Recommendations:  Problem # 1:  PRE-OPERATIVE RESPIRATORY EXAMINATION (ICD-V72.82) Assessment New  He should be low risk for pulmonary complications folloiwng shoulder surgery but due to his OSA, he can have resp decompensation following moderate sedation or GA. Therefore, advised surgery in hospital setting with anesthesia support. He needs to hvae CPAP following surgery or extubation (if procedure done under GA). Needs DVT prophylaxis too. I tried to call Dr. Hayden Rasmussen orthhopedic surgeon but he was in OR  Orders: Est. Patient Level IV (16109)  Problem # 2:  PULMONARY FIBROSIS, INTERSTITIAL (ICD-516.3) Assessment: Comment Only  Orders: Radiology Referral (Radiology)  CT findings on 04/03/2009 of UIP/Pulmonary  Fibrosis secondary to his RA.  However, he also had CHF/ A flutter at that time. Off amiodarone since April 2011. PFTs 07/12/2009 is normal.  PLAN continue cardio-pulm rehab as ongoing and f/u with pulm as ongoing Followup CT chest in 6 months  Orders: Est. Patient Level IV (60454)  Patient Instructions: 1)  >you are at low risk for complications following shoulder surgery 2)  >  still you need cpap after your shoulder surgery and could be at risk for sedation issues following surgery in view of sleep apnea 3)  > return as scheduled

## 2010-02-20 NOTE — Letter (Signed)
Summary: [facility] Progress Note    [facility] Progress Note    Imported By: Roderic Ovens 11/07/2009 10:20:13  _____________________________________________________________________  External Attachment:    Type:   Image     Comment:   External Document

## 2010-02-20 NOTE — Letter (Signed)
Summary: [facility] Instructions  [facility]  [address] [place], [place] [address]   Phone: [phone]  Fax: [phone]       ADA WOODBURY    January 09, 1932    MRN: [id number]        Procedure Day /Date: Monday, 09/16/09     Arrival Time: 2:00      Procedure Time: 3:00     Location of Procedure:                    Juliann Pares  [facility] (4th Floor)                        PREPARATION FOR COLONOSCOPY WITH MOVIPREP   Starting 5 days prior to your procedure 09/11/09 do not eat nuts, seeds, popcorn, corn, beans, peas,  salads, or any raw vegetables.  Do not take any fiber supplements (e.g. Metamucil, Citrucel, and Benefiber).  THE DAY BEFORE YOUR PROCEDURE         DATE: 09/15/09    DAY: Sunday  1.  Drink clear liquids the entire day-NO SOLID FOOD  2.  Do not drink anything colored red or purple.  Avoid juices with pulp.  No orange juice.  3.  Drink at least 64 oz. (8 glasses) of fluid/clear liquids during the day to prevent dehydration and help the prep work efficiently.  CLEAR LIQUIDS INCLUDE: Water Jello Ice Popsicles Tea (sugar ok, no milk/cream) Powdered fruit flavored drinks Coffee (sugar ok, no milk/cream) Gatorade Juice: apple, white grape, white cranberry  Lemonade Clear bullion, consomm, broth Carbonated beverages (any kind) Strained chicken noodle soup Hard Candy                             4.  In the morning, mix first dose of MoviPrep solution:    Empty 1 Pouch A and 1 Pouch B into the disposable container    Add lukewarm drinking water to the top line of the container. Mix to dissolve    Refrigerate (mixed solution should be used within 24 hrs)  5.  Begin drinking the prep at 5:00 p.m. The MoviPrep container is divided by 4 marks.   Every 15 minutes drink the solution down to the next mark (approximately 8 oz) until the full liter is complete.   6.  Follow completed prep with 16 oz of clear liquid of your choice (Nothing red  or purple).  Continue to drink clear liquids until bedtime.  7.  Before going to bed, mix second dose of MoviPrep solution:    Empty 1 Pouch A and 1 Pouch B into the disposable container    Add lukewarm drinking water to the top line of the container. Mix to dissolve    Refrigerate  THE DAY OF YOUR PROCEDURE      DATE: 09/16/09   DAY: Monday  Beginning at 10:00 a.m. (5 hours before procedure):         1. Every 15 minutes, drink the solution down to the next mark (approx 8 oz) until the full liter is complete.  2. Follow completed prep with 16 oz. of clear liquid of your choice.    3. You may drink clear liquids until 1:00 pm (2 HOURS BEFORE PROCEDURE).   MEDICATION INSTRUCTIONS  Unless otherwise instructed, you should take regular prescription medications with a small sip of water   as early as possible  the morning of your procedure.               OTHER INSTRUCTIONS  You will need a responsible adult at least 75 years of age to accompany you and drive you home.   This person must remain in the waiting room during your procedure.  Wear loose fitting clothing that is easily removed.  Leave jewelry and other valuables at home.  However, you may wish to bring a book to read or  an iPod/MP3 player to listen to music as you wait for your procedure to start.  Remove all body piercing jewelry and leave at home.  Total time from sign-in until discharge is approximately 2-3 hours.  You should go home directly after your procedure and rest.  You can resume normal activities the  day after your procedure.  The day of your procedure you should not:   Drive   Make legal decisions   Operate machinery   Drink alcohol   Return to work  You will receive specific instructions about eating, activities and medications before you leave.    The above instructions have been reviewed and explained to me by   _______________________    I fully understand and can  verbalize these instructions _____________________________ Date _________

## 2010-02-20 NOTE — Letter (Signed)
Summary: New Patient letter  [facility]  [address] [place], [place] [address]   Phone: [phone]  Fax: [phone]       07/31/2009 MRN: [id number]  [name] [address] [place], [place]  [address]  Dear [name],  Welcome to the Gastroenterology Division at [facility].    You are scheduled to see Dr.  Sheryn Bison on September 06, 2009 at 10:00am on the 3rd floor at [facility], [address]  We ask that you try to arrive at our office 15 minutes prior to your appointment time to allow for check-in.  We would like you to complete the enclosed self-administered evaluation form prior to your visit and bring it with you on the day of your appointment.  We will review it with you.  Also, please bring a complete list of all your medications or, if you prefer, bring the medication bottles and we will list them.  Please bring your insurance card so that we may make a copy of it.  If your insurance requires a referral to see a specialist, please bring your referral form from your primary care physician.  Co-payments are due at the time of your visit and may be paid by cash, check or credit card.     Your office visit will consist of a consult with your physician (includes a physical exam), any laboratory testing he/she may order, scheduling of any necessary diagnostic testing (e.g. x-ray, ultrasound, CT-scan), and scheduling of a procedure (e.g. Endoscopy, Colonoscopy) if required.  Please allow enough time on your schedule to allow for any/all of these possibilities.    If you cannot keep your appointment, please call [phone] to cancel or reschedule prior to your appointment date.  This allows [place] the opportunity to schedule an appointment for another patient in need of care.  If you do not cancel or reschedule by 5 p.m. the business day prior to your appointment date, you will be charged a $50.00 late cancellation/no-show fee.    Thank you  for choosing [facility] for your medical needs.  We appreciate the opportunity to care for you.  Please visit [place] at our website  to learn more about our practice.                     Sincerely,                                                             The Gastroenterology Division

## 2010-02-20 NOTE — Miscellaneous (Signed)
Summary: Cardiac/[facility]   Imported By: Lester Mayer 07/17/2009 09:20:30  _____________________________________________________________________  External Attachment:    Type:   Image     Comment:   External Document

## 2010-02-20 NOTE — Progress Notes (Signed)
Summary: returning call  Phone Note Call from Patient Call back at Home Phone [phone]   Caller: Patient Call For: Diana Armijo Reason for Call: Talk to Nurse Summary of Call: returning call from nurse re: results of pft and other test. Initial call taken by: Eugene Gavia,  July 31, 2009 2:58 PM  Follow-up for Phone Call        duplicate phone message Randell Loop [facility]  July 31, 2009 3:00 PM

## 2010-02-20 NOTE — Procedures (Signed)
Summary: Oximetry/Breathe  Oximetry/Breathe   Imported By: Lester Plainedge 11/21/2009 10:14:17  _____________________________________________________________________  External Attachment:    Type:   Image     Comment:   External Document

## 2010-02-20 NOTE — Miscellaneous (Signed)
Summary: Cardiac Rehab/[facility]  Cardiac Rehab/[facility]   Imported By: Sherian Rein 06/24/2009 11:52:12  _____________________________________________________________________  External Attachment:    Type:   Image     Comment:   External Document

## 2010-02-20 NOTE — Progress Notes (Signed)
Summary: needs new cpap mask asap  Phone Note Call from Patient Call back at Home Phone [phone]   Caller: Patient Call For: alva Summary of Call: pt having surgery (shoulder) tomorrow. wants a different cpap mask. says the one he used at sleep center (that covered his nose and mouth) worked best. pt wants this asap (before surgery tomorrow). he uses [facility] [place].  Initial call taken by: Tivis Ringer, CNA,  September 19, 2009 12:50 PM  Follow-up for Phone Call        spoke with pt and explained that the order will go in today and that i will let them know that he needs this mask for his surgery tomorrow. Randell Loop CMA  September 19, 2009 2:41 PM

## 2010-03-18 ENCOUNTER — Encounter (HOSPITAL_COMMUNITY): Payer: Medicare Other | Attending: Rheumatology

## 2010-03-18 DIAGNOSIS — M069 Rheumatoid arthritis, unspecified: Secondary | ICD-10-CM | POA: Insufficient documentation

## 2010-03-31 ENCOUNTER — Encounter (HOSPITAL_COMMUNITY): Payer: Medicare Other

## 2010-03-31 ENCOUNTER — Encounter (HOSPITAL_COMMUNITY): Payer: Medicare Other | Attending: Rheumatology

## 2010-03-31 DIAGNOSIS — M069 Rheumatoid arthritis, unspecified: Secondary | ICD-10-CM | POA: Insufficient documentation

## 2010-04-02 ENCOUNTER — Telehealth: Payer: Self-pay | Admitting: Internal Medicine

## 2010-04-03 LAB — COMPREHENSIVE METABOLIC PANEL
ALT: 12 U/L (ref 0–53)
Albumin: 3.4 g/dL — ABNORMAL LOW (ref 3.5–5.2)
BUN: 14 mg/dL (ref 6–23)
CO2: 28 mEq/L (ref 19–32)
Calcium: 8.8 mg/dL (ref 8.4–10.5)
Chloride: 108 mEq/L (ref 96–112)
Creatinine, Ser: 0.93 mg/dL (ref 0.4–1.5)
Glucose, Bld: 103 mg/dL — ABNORMAL HIGH (ref 70–99)
Potassium: 3.7 mEq/L (ref 3.5–5.1)
Sodium: 142 mEq/L (ref 135–145)
Total Bilirubin: 0.7 mg/dL (ref 0.3–1.2)
Total Protein: 6.7 g/dL (ref 6.0–8.3)

## 2010-04-03 LAB — CK TOTAL AND CKMB (NOT AT ARMC)
CK, MB: 3.9 ng/mL (ref 0.3–4.0)
CK, MB: 4.4 ng/mL — ABNORMAL HIGH (ref 0.3–4.0)
Relative Index: 3.4 — ABNORMAL HIGH (ref 0.0–2.5)
Total CK: 100 U/L (ref 7–232)

## 2010-04-03 LAB — URINALYSIS, ROUTINE W REFLEX MICROSCOPIC
Bilirubin Urine: NEGATIVE
Glucose, UA: NEGATIVE mg/dL
Hgb urine dipstick: NEGATIVE
Urobilinogen, UA: 0.2 mg/dL (ref 0.0–1.0)
pH: 6 (ref 5.0–8.0)

## 2010-04-03 LAB — CROSSMATCH: ABO/RH(D): O POS

## 2010-04-03 LAB — TROPONIN I
Troponin I: 0.11 ng/mL — ABNORMAL HIGH (ref 0.00–0.06)
Troponin I: 0.12 ng/mL — ABNORMAL HIGH (ref 0.00–0.06)

## 2010-04-03 LAB — APTT: aPTT: 32 seconds (ref 24–37)

## 2010-04-03 LAB — BASIC METABOLIC PANEL
CO2: 29 mEq/L (ref 19–32)
Calcium: 8.3 mg/dL — ABNORMAL LOW (ref 8.4–10.5)
Creatinine, Ser: 0.88 mg/dL (ref 0.4–1.5)
GFR calc non Af Amer: >60 mL/min (ref >60)
Glucose, Bld: 128 mg/dL — ABNORMAL HIGH (ref 70–99)

## 2010-04-03 LAB — DIFFERENTIAL
Basophils Absolute: 0.1 10*3/uL (ref 0.0–0.1)
Lymphocytes Relative: 15 % (ref 12–46)
Lymphs Abs: 1.3 10*3/uL (ref 0.7–4.0)
Monocytes Absolute: 0.8 10*3/uL (ref 0.1–1.0)
Neutro Abs: 5.6 10*3/uL (ref 1.7–7.7)

## 2010-04-03 LAB — SURGICAL PCR SCREEN: Staphylococcus aureus: NEGATIVE

## 2010-04-03 LAB — PROTIME-INR: Prothrombin Time: 14.2 seconds (ref 11.6–15.2)

## 2010-04-08 NOTE — Progress Notes (Signed)
Summary: gabapentin  Phone Note Refill Request Message from:  Fax from Pharmacy on April 02, 2010 1:20 PM  Refills Requested: Medication #1:  GABAPENTIN 300 MG CAPS 2 caps by mouth at bedtime   Last Refilled: 02/26/2010  Method Requested: Electronic Initial call taken by: Orlan Leavens RMA,  April 02, 2010 1:20 PM    Prescriptions: GABAPENTIN 300 MG CAPS (GABAPENTIN) 2 caps by mouth at bedtime  #60 x 6   Entered by:   Orlan Leavens RMA   Authorized by:   Newt Lukes MD   Signed by:   Orlan Leavens RMA on 04/02/2010   Method used:   [facility] to        [facility] [phone]* (retail)       [address]       [place], [place]  [address]       Ph: [phone] or [phone]       Fax: [phone]   RxID:   [id number]

## 2010-04-10 ENCOUNTER — Other Ambulatory Visit: Payer: Self-pay | Admitting: *Deleted

## 2010-04-10 DIAGNOSIS — I480 Paroxysmal atrial fibrillation: Secondary | ICD-10-CM

## 2010-04-10 MED ORDER — DIGOXIN 250 MCG PO TABS: 250.0000 ug | ORAL_TABLET | Freq: Every day | ORAL | Status: DC

## 2010-04-10 NOTE — Telephone Encounter (Signed)
Refilled digoxin 

## 2010-04-11 LAB — PROTEIN ELECTROPHORESIS, SERUM
Alpha-1-Globulin: 6.5 % — ABNORMAL HIGH (ref 2.9–4.9)
Alpha-2-Globulin: 15 % — ABNORMAL HIGH (ref 7.1–11.8)
Beta Globulin: 5.9 % (ref 4.7–7.2)
Total Protein ELP: 5.5 g/dL — ABNORMAL LOW (ref 6.0–8.3)

## 2010-04-11 LAB — CBC
HCT: 33.4 % — ABNORMAL LOW (ref 39.0–52.0)
HCT: 36.5 % — ABNORMAL LOW (ref 39.0–52.0)
HCT: 37.3 % — ABNORMAL LOW (ref 39.0–52.0)
HCT: 37.8 % — ABNORMAL LOW (ref 39.0–52.0)
Hemoglobin: 12.3 g/dL — ABNORMAL LOW (ref 13.0–17.0)
Hemoglobin: 12.4 g/dL — ABNORMAL LOW (ref 13.0–17.0)
Hemoglobin: 12.4 g/dL — ABNORMAL LOW (ref 13.0–17.0)
Hemoglobin: 12.5 g/dL — ABNORMAL LOW (ref 13.0–17.0)
Hemoglobin: 12.6 g/dL — ABNORMAL LOW (ref 13.0–17.0)
MCHC: 33 g/dL (ref 30.0–36.0)
MCHC: 33 g/dL (ref 30.0–36.0)
MCHC: 33.9 g/dL (ref 30.0–36.0)
MCHC: 34.1 g/dL (ref 30.0–36.0)
MCV: 99.6 fL (ref 78.0–100.0)
MCV: 99.7 fL (ref 78.0–100.0)
Platelets: 207 10*3/uL (ref 150–400)
Platelets: 225 10*3/uL (ref 150–400)
Platelets: 266 10*3/uL (ref 150–400)
Platelets: 305 10*3/uL (ref 150–400)
RBC: 3.35 MIL/uL — ABNORMAL LOW (ref 4.22–5.81)
RBC: 3.67 MIL/uL — ABNORMAL LOW (ref 4.22–5.81)
RBC: 3.76 MIL/uL — ABNORMAL LOW (ref 4.22–5.81)
RBC: 3.79 MIL/uL — ABNORMAL LOW (ref 4.22–5.81)
RDW: 16.1 % — ABNORMAL HIGH (ref 11.5–15.5)
RDW: 16.1 % — ABNORMAL HIGH (ref 11.5–15.5)
RDW: 16.3 % — ABNORMAL HIGH (ref 11.5–15.5)
RDW: 16.3 % — ABNORMAL HIGH (ref 11.5–15.5)
RDW: 16.3 % — ABNORMAL HIGH (ref 11.5–15.5)
WBC: 10.7 10*3/uL — ABNORMAL HIGH (ref 4.0–10.5)
WBC: 11.1 10*3/uL — ABNORMAL HIGH (ref 4.0–10.5)
WBC: 12.5 10*3/uL — ABNORMAL HIGH (ref 4.0–10.5)
WBC: 14.3 10*3/uL — ABNORMAL HIGH (ref 4.0–10.5)
WBC: 15.3 10*3/uL — ABNORMAL HIGH (ref 4.0–10.5)

## 2010-04-11 LAB — DIFFERENTIAL
Basophils Absolute: 0 10*3/uL (ref 0.0–0.1)
Basophils Absolute: 0.2 10*3/uL — ABNORMAL HIGH (ref 0.0–0.1)
Eosinophils Absolute: 0 10*3/uL (ref 0.0–0.7)
Eosinophils Relative: 0 % (ref 0–5)
Lymphocytes Relative: 4 % — ABNORMAL LOW (ref 12–46)
Lymphocytes Relative: 7 % — ABNORMAL LOW (ref 12–46)
Lymphs Abs: 0.7 10*3/uL (ref 0.7–4.0)
Monocytes Absolute: 0.6 10*3/uL (ref 0.1–1.0)
Monocytes Relative: 6 % (ref 3–12)
Neutro Abs: 9.2 10*3/uL — ABNORMAL HIGH (ref 1.7–7.7)

## 2010-04-11 LAB — CK TOTAL AND CKMB (NOT AT ARMC)
CK, MB: 4.5 ng/mL — ABNORMAL HIGH (ref 0.3–4.0)
Total CK: 73 U/L (ref 7–232)

## 2010-04-11 LAB — GLUCOSE, CAPILLARY
Glucose-Capillary: 102 mg/dL — ABNORMAL HIGH (ref 70–99)
Glucose-Capillary: 102 mg/dL — ABNORMAL HIGH (ref 70–99)
Glucose-Capillary: 108 mg/dL — ABNORMAL HIGH (ref 70–99)
Glucose-Capillary: 115 mg/dL — ABNORMAL HIGH (ref 70–99)
Glucose-Capillary: 123 mg/dL — ABNORMAL HIGH (ref 70–99)
Glucose-Capillary: 125 mg/dL — ABNORMAL HIGH (ref 70–99)
Glucose-Capillary: 129 mg/dL — ABNORMAL HIGH (ref 70–99)
Glucose-Capillary: 132 mg/dL — ABNORMAL HIGH (ref 70–99)
Glucose-Capillary: 135 mg/dL — ABNORMAL HIGH (ref 70–99)
Glucose-Capillary: 144 mg/dL — ABNORMAL HIGH (ref 70–99)
Glucose-Capillary: 146 mg/dL — ABNORMAL HIGH (ref 70–99)
Glucose-Capillary: 148 mg/dL — ABNORMAL HIGH (ref 70–99)
Glucose-Capillary: 155 mg/dL — ABNORMAL HIGH (ref 70–99)
Glucose-Capillary: 162 mg/dL — ABNORMAL HIGH (ref 70–99)
Glucose-Capillary: 169 mg/dL — ABNORMAL HIGH (ref 70–99)
Glucose-Capillary: 98 mg/dL (ref 70–99)
Glucose-Capillary: 99 mg/dL (ref 70–99)

## 2010-04-11 LAB — TSH: TSH: 1.214 u[IU]/mL (ref 0.350–4.500)

## 2010-04-11 LAB — BASIC METABOLIC PANEL
BUN: 15 mg/dL (ref 6–23)
BUN: 15 mg/dL (ref 6–23)
BUN: 19 mg/dL (ref 6–23)
CO2: 29 mEq/L (ref 19–32)
CO2: 32 mEq/L (ref 19–32)
CO2: 32 mEq/L (ref 19–32)
Calcium: 7.9 mg/dL — ABNORMAL LOW (ref 8.4–10.5)
Calcium: 7.9 mg/dL — ABNORMAL LOW (ref 8.4–10.5)
Calcium: 8 mg/dL — ABNORMAL LOW (ref 8.4–10.5)
Calcium: 8.1 mg/dL — ABNORMAL LOW (ref 8.4–10.5)
Calcium: 8.1 mg/dL — ABNORMAL LOW (ref 8.4–10.5)
Calcium: 8.3 mg/dL — ABNORMAL LOW (ref 8.4–10.5)
Chloride: 101 mEq/L (ref 96–112)
Chloride: 99 mEq/L (ref 96–112)
Creatinine, Ser: 0.86 mg/dL (ref 0.4–1.5)
GFR calc Af Amer: >60 mL/min (ref >60)
GFR calc Af Amer: >60 mL/min (ref >60)
GFR calc Af Amer: >60 mL/min (ref >60)
GFR calc Af Amer: >60 mL/min (ref >60)
GFR calc Af Amer: >60 mL/min (ref >60)
GFR calc Af Amer: >60 mL/min (ref >60)
GFR calc non Af Amer: >60 mL/min (ref >60)
GFR calc non Af Amer: >60 mL/min (ref >60)
GFR calc non Af Amer: >60 mL/min (ref >60)
GFR calc non Af Amer: >60 mL/min (ref >60)
Glucose, Bld: 110 mg/dL — ABNORMAL HIGH (ref 70–99)
Glucose, Bld: 130 mg/dL — ABNORMAL HIGH (ref 70–99)
Glucose, Bld: 136 mg/dL — ABNORMAL HIGH (ref 70–99)
Glucose, Bld: 144 mg/dL — ABNORMAL HIGH (ref 70–99)
Potassium: 3.7 mEq/L (ref 3.5–5.1)
Potassium: 3.7 mEq/L (ref 3.5–5.1)
Potassium: 3.8 mEq/L (ref 3.5–5.1)
Potassium: 4.1 mEq/L (ref 3.5–5.1)
Potassium: 4.1 mEq/L (ref 3.5–5.1)
Sodium: 137 mEq/L (ref 135–145)
Sodium: 137 mEq/L (ref 135–145)
Sodium: 138 mEq/L (ref 135–145)
Sodium: 139 mEq/L (ref 135–145)
Sodium: 141 mEq/L (ref 135–145)
Sodium: 142 mEq/L (ref 135–145)

## 2010-04-11 LAB — URINALYSIS, ROUTINE W REFLEX MICROSCOPIC
Bilirubin Urine: NEGATIVE
Glucose, UA: NEGATIVE mg/dL
Ketones, ur: NEGATIVE mg/dL
Ketones, ur: NEGATIVE mg/dL
Leukocytes, UA: NEGATIVE
Nitrite: NEGATIVE
Protein, ur: NEGATIVE mg/dL
Protein, ur: NEGATIVE mg/dL
Urobilinogen, UA: 0.2 mg/dL (ref 0.0–1.0)

## 2010-04-11 LAB — HEMOGLOBIN A1C
Hgb A1c MFr Bld: 5.4 % (ref 4.6–6.1)
Mean Plasma Glucose: 108 mg/dL

## 2010-04-11 LAB — RAPID URINE DRUG SCREEN, HOSP PERFORMED
Amphetamines: NOT DETECTED
Barbiturates: NOT DETECTED
Benzodiazepines: NOT DETECTED
Tetrahydrocannabinol: NOT DETECTED

## 2010-04-11 LAB — PHOSPHORUS: Phosphorus: 3.4 mg/dL (ref 2.3–4.6)

## 2010-04-11 LAB — PROTIME-INR
INR: 1.05 (ref 0.00–1.49)
INR: 1.2 (ref 0.00–1.49)
INR: 1.26 (ref 0.00–1.49)
INR: 1.31 (ref 0.00–1.49)
INR: 1.33 (ref 0.00–1.49)
INR: 1.49 (ref 0.00–1.49)
Prothrombin Time: 13.6 seconds (ref 11.6–15.2)
Prothrombin Time: 16.2 seconds — ABNORMAL HIGH (ref 11.6–15.2)
Prothrombin Time: 17.9 seconds — ABNORMAL HIGH (ref 11.6–15.2)

## 2010-04-11 LAB — URINE CULTURE
Colony Count: >=100000
Special Requests: NEGATIVE

## 2010-04-11 LAB — COMPREHENSIVE METABOLIC PANEL
Albumin: 3.5 g/dL (ref 3.5–5.2)
BUN: 14 mg/dL (ref 6–23)
Creatinine, Ser: 0.9 mg/dL (ref 0.4–1.5)
Total Protein: 6.4 g/dL (ref 6.0–8.3)

## 2010-04-11 LAB — CARDIAC PANEL(CRET KIN+CKTOT+MB+TROPI)
CK, MB: 9.5 ng/mL (ref 0.3–4.0)
Relative Index: 3.4 — ABNORMAL HIGH (ref 0.0–2.5)
Total CK: 280 U/L — ABNORMAL HIGH (ref 7–232)

## 2010-04-11 LAB — HEPATIC FUNCTION PANEL
AST: 18 U/L (ref 0–37)
Bilirubin, Direct: 0.3 mg/dL (ref 0.0–0.3)

## 2010-04-11 LAB — HEPARIN LEVEL (UNFRACTIONATED)
Heparin Unfractionated: 1.49 IU/mL — ABNORMAL HIGH (ref 0.30–0.70)
Heparin Unfractionated: <0.1 IU/mL — ABNORMAL LOW (ref 0.30–0.70)

## 2010-04-11 LAB — MAGNESIUM: Magnesium: 1.7 mg/dL (ref 1.5–2.5)

## 2010-04-11 LAB — POCT CARDIAC MARKERS
CKMB, poc: 2.3 ng/mL (ref 1.0–8.0)
CKMB, poc: 3.6 ng/mL (ref 1.0–8.0)
Troponin i, poc: <0.05 ng/mL (ref 0.00–0.09)

## 2010-04-11 LAB — CK: Total CK: 66 U/L (ref 7–232)

## 2010-04-11 LAB — TROPONIN I: Troponin I: 0.11 ng/mL — ABNORMAL HIGH (ref 0.00–0.06)

## 2010-04-11 LAB — T4, FREE: Free T4: 1.18 ng/dL (ref 0.80–1.80)

## 2010-04-14 ENCOUNTER — Encounter (HOSPITAL_COMMUNITY): Payer: Medicare Other

## 2010-04-23 ENCOUNTER — Telehealth: Payer: Self-pay | Admitting: *Deleted

## 2010-04-23 NOTE — Telephone Encounter (Signed)
Summary of Call: he was supposed to have fu CT chest in jan ? 26th 2011 and then see me. What happened? Initial call taken by: Kalman Shan MD,  April 07, 2010 11:47 PM  LMTCBx1. Carron Curie, CMA

## 2010-05-05 ENCOUNTER — Other Ambulatory Visit: Payer: Self-pay | Admitting: *Deleted

## 2010-05-05 MED ORDER — GABAPENTIN 300 MG PO CAPS: 300.0000 mg | ORAL_CAPSULE | Freq: Every day | ORAL | Status: DC

## 2010-05-07 ENCOUNTER — Encounter: Payer: Self-pay | Admitting: Internal Medicine

## 2010-05-07 DIAGNOSIS — H8110 Benign paroxysmal vertigo, unspecified ear: Secondary | ICD-10-CM

## 2010-05-12 ENCOUNTER — Encounter (HOSPITAL_COMMUNITY): Payer: Medicare Other

## 2010-05-12 ENCOUNTER — Ambulatory Visit: Payer: Medicare Other | Admitting: Internal Medicine

## 2010-05-12 NOTE — Telephone Encounter (Signed)
LMTCBx2. Ily Denno, CMA  

## 2010-05-14 ENCOUNTER — Encounter: Payer: Self-pay | Admitting: *Deleted

## 2010-05-14 NOTE — Telephone Encounter (Signed)
LMTCBx3. I will send a letter per protocol. Carron Curie, CMA

## 2010-06-23 ENCOUNTER — Telehealth: Payer: Self-pay | Admitting: Cardiology

## 2010-06-23 NOTE — Telephone Encounter (Signed)
Med. That he has been out of for months and wants a refill And Dimas Millin did but he is not sure that it is the same that Dr. [place] prescribed. Can you call him back to check it out.

## 2010-06-23 NOTE — Telephone Encounter (Signed)
Called wanting to know if he should be taking diltiazem. Has been taking until past 3 days. According to note from 12/03/09 he stopped taking on his own because "taking too much medication". Will check w/ Dr. [place] when he returns from vac and call him back to see if should be taking. Is taking Pradaxa. States he has not had any swelling since been off med for past 3 days.

## 2010-06-30 ENCOUNTER — Other Ambulatory Visit (INDEPENDENT_AMBULATORY_CARE_PROVIDER_SITE_OTHER): Payer: Medicare Other

## 2010-06-30 ENCOUNTER — Ambulatory Visit (INDEPENDENT_AMBULATORY_CARE_PROVIDER_SITE_OTHER): Payer: Medicare Other | Admitting: Internal Medicine

## 2010-06-30 ENCOUNTER — Encounter: Payer: Self-pay | Admitting: Internal Medicine

## 2010-06-30 DIAGNOSIS — R7309 Other abnormal glucose: Secondary | ICD-10-CM

## 2010-06-30 DIAGNOSIS — I4892 Unspecified atrial flutter: Secondary | ICD-10-CM

## 2010-06-30 DIAGNOSIS — I10 Essential (primary) hypertension: Secondary | ICD-10-CM

## 2010-06-30 NOTE — Progress Notes (Signed)
Subjective:    Patient ID: [name], male    DOB: 03-01-1931, 77 y.o.   MRN: [id number]  HPI  Here for follow up - reviewed chronic medical issues:  pulm fibrosis - idopathic and ?amio component - follows with pulm for same - denies SOB or DOE at this time - reports compliance with meds - no O2 needs   A fib/flutter - hosp with same with exac CHF 03/2009 - s/p cardioversion and ablation 03/2009 = sinus maintained, off amio due to above - denies palp or edema changes - compliants with meds as rx'd and follows with dr. [place] - on pradaxa    RA - follows with rheum for same - on arava since start of 2011, denies adv effects from same, pain and mobility much improved - intermit pred use   OSA - intermittent CPAP use - believes weight loss which has occured since 03/2009 hosp has helped his OSA symptoms so CPAP "not necessary most of the time"   chronic neuropathic pain - burning and numbness affects hands and feet, no recent change in symptoms - controlled with gabapentin -   hx elev glc at dr. [place] - random 130s - hx borderline sugar in past  tries to avoid steroids for arthritis (RA) or pulm flares  admits to overindulg in sweets at night and snacks   Past Medical History  Diagnosis Date  . Atrial flutter     s/p ablation for RVR  03/2009, pradaxa ongoing  . VENOUS INSUFFICIENCY, LEFT LEG   . PULMONARY FIBROSIS, INTERSTITIAL   . OBSTRUCTIVE SLEEP APNEA   . BENIGN POSITIONAL VERTIGO   . HYPERGLYCEMIA 12/30/2009  . Interstitial lung disease   . ALLERGIC RHINITIS   . Rheumatoid arthritis   . Chronic CHF   . PERIPHERAL NEUROPATHY   . HYPERTENSION   . HYPERLIPIDEMIA   . DEPRESSION      Review of Systems  Constitutional: Negative for fatigue.  Respiratory: Negative for cough.   Cardiovascular: Negative for chest pain.  Neurological: Negative for headaches.  Hematological: Bruises/bleeds easily (on pradaxa).       Objective:   Physical Exam BP 120/62  Pulse 103   Temp(Src) 98.1 F (36.7 C) (Oral)  Ht 5\' 8"  (1.727 m)  Wt 230 lb 12.8 oz (104.69 kg)  BMI 35.09 kg/m2  SpO2 98% Wt Readings from Last 3 Encounters:  06/30/10 230 lb 12.8 oz (104.69 kg)  02/03/10 234 lb 3.2 oz (106.232 kg)  12/30/09 235 lb 6.4 oz (106.777 kg)    Physical Exam  Constitutional:  oriented to person, place, and time. appears well-developed and well-nourished. No distress.  Neck: Normal range of motion. Neck supple. No JVD present. No thyromegaly present.  Cardiovascular: Normal rate, regular rhythm and normal heart sounds.  No murmur heard. Pulmonary/Chest: Effort normal and breath sounds normal. No respiratory distress. no wheezes.  Psychiatric: he has a normal mood and affect. behavior is normal. Judgment and thought content normal.   Lab Results  Component Value Date   WBC 8.1 09/20/2009   HGB 12.2* 09/20/2009   HCT 36.4* 09/20/2009   PLT 301 09/20/2009   ALT 12 09/20/2009   AST 15 09/20/2009   NA 141 09/21/2009   K 3.9 09/21/2009   CL 106 09/21/2009   CREATININE 0.88 09/21/2009   BUN 11 09/21/2009   CO2 29 09/21/2009   TSH 1.959 09/21/2009   INR 1.08 09/21/2009   HGBA1C 5.8 12/30/2009  Assessment & Plan:  See problem list. Medications and labs reviewed today.

## 2010-06-30 NOTE — Assessment & Plan Note (Signed)
Chronic anticoag with pradaxa - last exac with RVR AF 03/2009 euvolemic and compensated at this time Follows with cards for same ([place])- The current medical regimen is effective;  continue present plan and medications.

## 2010-06-30 NOTE — Patient Instructions (Signed)
It was good to see you today. Test(s) ordered today. Your results will be called to you after review (48-72hours after test completion). If any changes need to be made, you will be notified at that time. Medications reviewed, no changes at this time. Please schedule followup in 6 months, call sooner if problems.

## 2010-06-30 NOTE — Assessment & Plan Note (Signed)
Random hyperglycemia on labs with cards fall 2011 - check a1c q6-11mo due to FH DM but pt doing well with weight control to manage same Lab Results  Component Value Date   HGBA1C 5.8 12/30/2009

## 2010-06-30 NOTE — Assessment & Plan Note (Signed)
BP Readings from Last 3 Encounters:  06/30/10 120/62  02/03/10 127/84  12/30/09 126/72   The current medical regimen is effective;  continue present plan and medications.

## 2010-07-01 MED ORDER — DILTIAZEM HCL ER COATED BEADS 180 MG PO CP24: 180.0000 mg | ORAL_CAPSULE | Freq: Every day | ORAL | Status: DC

## 2010-07-01 NOTE — Telephone Encounter (Signed)
Dr. [place] states he needs to stay on diltiazem. [facility] Lm for pt to restart taking medication.

## 2010-07-28 ENCOUNTER — Telehealth: Payer: Self-pay | Admitting: Pulmonary Disease

## 2010-07-28 ENCOUNTER — Other Ambulatory Visit: Payer: Self-pay | Admitting: Cardiology

## 2010-07-28 NOTE — Telephone Encounter (Signed)
Called, spoke with Casimiro Needle at [employer].  He states Misty Stanley has left for the day and he does not know which med she was calling about.  He will leave her a message to call back tomorrow for clarification on which med pt needs refilled.

## 2010-07-28 NOTE — Telephone Encounter (Signed)
escribe medication per fax request  

## 2010-07-29 MED ORDER — DILTIAZEM HCL ER COATED BEADS 180 MG PO CP24: 180.0000 mg | ORAL_CAPSULE | Freq: Every day | ORAL | Status: DC

## 2010-07-29 NOTE — Telephone Encounter (Signed)
Spoke with Pharmacist. She stats pt needing diltiazem refilled- TP was the last one to refill. Pt overdue for rov, so gave him #30 with no RF and advised pharmacist to have him call for appt.

## 2010-08-25 ENCOUNTER — Encounter: Payer: Self-pay | Admitting: Pulmonary Disease

## 2010-08-25 ENCOUNTER — Ambulatory Visit (INDEPENDENT_AMBULATORY_CARE_PROVIDER_SITE_OTHER)
Admission: RE | Admit: 2010-08-25 | Discharge: 2010-08-25 | Disposition: A | Discharge status: Home / Self Care | Payer: Medicare Other | Source: Ambulatory Visit | Attending: Pulmonary Disease | Admitting: Pulmonary Disease

## 2010-08-25 ENCOUNTER — Ambulatory Visit (INDEPENDENT_AMBULATORY_CARE_PROVIDER_SITE_OTHER): Payer: Medicare Other | Admitting: Pulmonary Disease

## 2010-08-25 DIAGNOSIS — G4730 Sleep apnea, unspecified: Secondary | ICD-10-CM

## 2010-08-25 DIAGNOSIS — J84111 Idiopathic interstitial pneumonia, not otherwise specified: Secondary | ICD-10-CM

## 2010-08-25 NOTE — Patient Instructions (Signed)
Chest xray today If worse, you may need a scan to look at scar tissue in your lungs. Breathing test OK to get replacement mask or straps  - call your DME company. We will send Rx

## 2010-08-25 NOTE — Progress Notes (Signed)
  Subjective:    Patient ID: [name], male    DOB: December 02, 1931, 77 y.o.   MRN: [id number]  HPI  PCP - Felicity Coyer Rheum - Durenda Age Cards- [place]  77 year old  male with pulmonary Fibrosis due to RA and OSA , CHF with poor EF. Was admited  3/ 2011 with A Flutter/Fib and CHF exacerbation and needed ICU stay and amiodarone. CT showed evidence of diffuse pulmonary fibrosis. He was cardioverted and ablated with retun to sinus. Cath 04/06/2009 was normal coronaries.  PSG 8/11 showed - moderate obstructive sleep apnea AHI 22/h, lowest desatn 78% - corrected by cpaP 9 cm with med FF mask  ? persistent REM related desaturation on 9 cm related to underlying cardiopulmonary disease   November 11, 2009 Reviewde oNO on CPAP - destauration x 9 mins only over total recording time x 10 h, PFTs showed mild restriction, no obstruction  08/25/2010 Feels good, compliant with CPAP,likes his new machine & mask, this is helping. Mask ok, pressure ok, no dryness or headaches or discharge CXR stable - improved interstitial opacities    Review of Systems Patient denies significant dyspnea,cough, hemoptysis,  chest pain, palpitations, pedal edema, orthopnea, paroxysmal nocturnal dyspnea, lightheadedness, nausea, vomiting, abdominal or  leg pains      Objective:   Physical Exam Gen. Pleasant, well-nourished, in no distress ENT - no lesions, no post nasal drip Neck: No JVD, no thyromegaly, no carotid bruits Lungs: no use of accessory muscles, no dullness to percussion, clear without rales or rhonchi  Cardiovascular: Rhythm regular, heart sounds  normal, no murmurs or gallops, no peripheral edema Musculoskeletal: No deformities, no cyanosis or clubbing         Assessment & Plan:

## 2010-08-29 NOTE — Assessment & Plan Note (Signed)
Weight loss encouraged, compliance with goal of at least 4-6 hrs every night is the expectation. Advised against medications with sedative side effects Cautioned against driving when sleepy - understanding that sleepiness will vary on a day to day basis OK to get new supplies

## 2010-08-29 NOTE — Assessment & Plan Note (Signed)
Appears stable on CXR Rpt PFTs & compare. If worse, may need rpt CT & compare to 3/11

## 2010-09-08 ENCOUNTER — Telehealth: Payer: Self-pay | Admitting: Pulmonary Disease

## 2010-09-08 NOTE — Telephone Encounter (Signed)
Spoke with pt and notified of cxr results. Pt verbalized understanding.

## 2010-09-09 ENCOUNTER — Other Ambulatory Visit: Payer: Self-pay | Admitting: Cardiology

## 2010-09-09 NOTE — Telephone Encounter (Signed)
escribe medication per fax request  

## 2010-09-12 ENCOUNTER — Telehealth: Payer: Self-pay | Admitting: Cardiology

## 2010-09-12 NOTE — Telephone Encounter (Signed)
Pt has questions about his medication Pradaxa.  Pt quit taking the medication because he saw something on TV about a lawsuit.  Pt wants a suggestion for another medication similar to this.  Pt is aware since almost 4pm he may not receive a call until next business.  Looking for Chart

## 2010-09-12 NOTE — Telephone Encounter (Signed)
Called stating he saw on TV about a lawsuit w/Pradaxa. Advised that does not apply to him and he needs to stay on Pradaxa. Wants to know if there is another medication that he could try. Will speak w/Dr. [place] next week and call him back w/ his recommendation. But for now needs to stay on Pradaxa to prevent a stroke.

## 2010-09-15 ENCOUNTER — Encounter: Payer: Self-pay | Admitting: Internal Medicine

## 2010-09-15 ENCOUNTER — Ambulatory Visit (INDEPENDENT_AMBULATORY_CARE_PROVIDER_SITE_OTHER): Payer: Medicare Other | Admitting: Internal Medicine

## 2010-09-15 VITALS — BP 120/80 | HR 62 | Temp 97.6°F | Ht 68.0 in | Wt 209.4 lb

## 2010-09-15 DIAGNOSIS — G4700 Insomnia, unspecified: Secondary | ICD-10-CM

## 2010-09-15 DIAGNOSIS — F411 Generalized anxiety disorder: Secondary | ICD-10-CM

## 2010-09-15 DIAGNOSIS — F329 Major depressive disorder, single episode, unspecified: Secondary | ICD-10-CM

## 2010-09-15 DIAGNOSIS — I10 Essential (primary) hypertension: Secondary | ICD-10-CM

## 2010-09-15 DIAGNOSIS — F32A Depression, unspecified: Secondary | ICD-10-CM

## 2010-09-15 DIAGNOSIS — F419 Anxiety disorder, unspecified: Secondary | ICD-10-CM | POA: Insufficient documentation

## 2010-09-15 MED ORDER — ESCITALOPRAM OXALATE 10 MG PO TABS: 10.0000 mg | ORAL_TABLET | Freq: Every day | ORAL | Status: AC

## 2010-09-15 MED ORDER — ZOLPIDEM TARTRATE ER 6.25 MG PO TBCR: 6.2500 mg | EXTENDED_RELEASE_TABLET | Freq: Every evening | ORAL | Status: AC | PRN

## 2010-09-15 MED ORDER — HYDROXYZINE PAMOATE 50 MG PO CAPS: 50.0000 mg | ORAL_CAPSULE | Freq: Three times a day (TID) | ORAL | Status: AC | PRN

## 2010-09-15 NOTE — Patient Instructions (Signed)
Take all new medications as prescribed Continue all other medications as before  

## 2010-09-16 ENCOUNTER — Ambulatory Visit: Payer: Medicare Other | Admitting: Internal Medicine

## 2010-09-17 ENCOUNTER — Ambulatory Visit: Payer: Medicare Other | Admitting: Cardiology

## 2010-09-19 ENCOUNTER — Emergency Department (HOSPITAL_COMMUNITY)
Admission: EM | Admit: 2010-09-19 | Discharge: 2010-09-19 | Disposition: A | Discharge status: Other Acute Inpatient Hospital | Payer: Medicare Other | Source: Home / Self Care | Attending: Emergency Medicine | Admitting: Emergency Medicine

## 2010-09-19 ENCOUNTER — Inpatient Hospital Stay (HOSPITAL_COMMUNITY)
Admission: RE | Admit: 2010-09-19 | Discharge: 2010-10-15 | DRG: 885 | Disposition: A | Discharge status: Nursing Home (Includes ICF) | Payer: Medicare Other | Source: Ambulatory Visit | Attending: Psychiatry | Admitting: Psychiatry

## 2010-09-19 DIAGNOSIS — R4182 Altered mental status, unspecified: Secondary | ICD-10-CM

## 2010-09-19 DIAGNOSIS — R45851 Suicidal ideations: Secondary | ICD-10-CM

## 2010-09-19 DIAGNOSIS — Z79899 Other long term (current) drug therapy: Secondary | ICD-10-CM

## 2010-09-19 DIAGNOSIS — I872 Venous insufficiency (chronic) (peripheral): Secondary | ICD-10-CM

## 2010-09-19 DIAGNOSIS — E1142 Type 2 diabetes mellitus with diabetic polyneuropathy: Secondary | ICD-10-CM

## 2010-09-19 DIAGNOSIS — I4891 Unspecified atrial fibrillation: Secondary | ICD-10-CM | POA: Insufficient documentation

## 2010-09-19 DIAGNOSIS — G309 Alzheimer's disease, unspecified: Secondary | ICD-10-CM

## 2010-09-19 DIAGNOSIS — Z7982 Long term (current) use of aspirin: Secondary | ICD-10-CM

## 2010-09-19 DIAGNOSIS — IMO0002 Reserved for concepts with insufficient information to code with codable children: Secondary | ICD-10-CM

## 2010-09-19 DIAGNOSIS — G4730 Sleep apnea, unspecified: Secondary | ICD-10-CM

## 2010-09-19 DIAGNOSIS — F609 Personality disorder, unspecified: Secondary | ICD-10-CM

## 2010-09-19 DIAGNOSIS — E669 Obesity, unspecified: Secondary | ICD-10-CM

## 2010-09-19 DIAGNOSIS — F29 Unspecified psychosis not due to a substance or known physiological condition: Secondary | ICD-10-CM

## 2010-09-19 DIAGNOSIS — I428 Other cardiomyopathies: Secondary | ICD-10-CM

## 2010-09-19 DIAGNOSIS — R259 Unspecified abnormal involuntary movements: Secondary | ICD-10-CM

## 2010-09-19 DIAGNOSIS — F3289 Other specified depressive episodes: Secondary | ICD-10-CM | POA: Insufficient documentation

## 2010-09-19 DIAGNOSIS — F0280 Dementia in other diseases classified elsewhere without behavioral disturbance: Secondary | ICD-10-CM

## 2010-09-19 DIAGNOSIS — F411 Generalized anxiety disorder: Secondary | ICD-10-CM

## 2010-09-19 DIAGNOSIS — F339 Major depressive disorder, recurrent, unspecified: Principal | ICD-10-CM

## 2010-09-19 DIAGNOSIS — R29898 Other symptoms and signs involving the musculoskeletal system: Secondary | ICD-10-CM

## 2010-09-19 DIAGNOSIS — Z0189 Encounter for other specified special examinations: Secondary | ICD-10-CM

## 2010-09-19 DIAGNOSIS — I10 Essential (primary) hypertension: Secondary | ICD-10-CM

## 2010-09-19 DIAGNOSIS — I509 Heart failure, unspecified: Secondary | ICD-10-CM | POA: Insufficient documentation

## 2010-09-19 DIAGNOSIS — Z111 Encounter for screening for respiratory tuberculosis: Secondary | ICD-10-CM

## 2010-09-19 DIAGNOSIS — R413 Other amnesia: Secondary | ICD-10-CM

## 2010-09-19 DIAGNOSIS — F329 Major depressive disorder, single episode, unspecified: Secondary | ICD-10-CM | POA: Insufficient documentation

## 2010-09-19 DIAGNOSIS — M7989 Other specified soft tissue disorders: Secondary | ICD-10-CM

## 2010-09-19 DIAGNOSIS — M069 Rheumatoid arthritis, unspecified: Secondary | ICD-10-CM

## 2010-09-19 DIAGNOSIS — E1149 Type 2 diabetes mellitus with other diabetic neurological complication: Secondary | ICD-10-CM

## 2010-09-19 DIAGNOSIS — R269 Unspecified abnormalities of gait and mobility: Secondary | ICD-10-CM

## 2010-09-19 LAB — BASIC METABOLIC PANEL
Calcium: 8.9 mg/dL (ref 8.4–10.5)
Chloride: 98 mEq/L (ref 96–112)
Creatinine, Ser: 0.78 mg/dL (ref 0.50–1.35)
GFR calc Af Amer: >60 mL/min (ref >60)
GFR calc non Af Amer: >60 mL/min (ref >60)

## 2010-09-19 LAB — CBC
Platelets: 322 10*3/uL (ref 150–400)
RBC: 4.66 MIL/uL (ref 4.22–5.81)
RDW: 15.7 % — ABNORMAL HIGH (ref 11.5–15.5)
WBC: 11.2 10*3/uL — ABNORMAL HIGH (ref 4.0–10.5)

## 2010-09-19 LAB — URINALYSIS, ROUTINE W REFLEX MICROSCOPIC
Bilirubin Urine: NEGATIVE
Hgb urine dipstick: NEGATIVE
Ketones, ur: NEGATIVE mg/dL
Nitrite: NEGATIVE
pH: 6.5 (ref 5.0–8.0)

## 2010-09-19 LAB — DIFFERENTIAL
Basophils Absolute: 0 10*3/uL (ref 0.0–0.1)
Basophils Relative: 0 % (ref 0–1)
Eosinophils Absolute: 0.1 10*3/uL (ref 0.0–0.7)
Eosinophils Relative: 1 % (ref 0–5)
Neutrophils Relative %: 80 % — ABNORMAL HIGH (ref 43–77)

## 2010-09-19 LAB — RAPID URINE DRUG SCREEN, HOSP PERFORMED
Amphetamines: NOT DETECTED
Barbiturates: NOT DETECTED
Benzodiazepines: NOT DETECTED
Cocaine: NOT DETECTED
Tetrahydrocannabinol: NOT DETECTED

## 2010-09-19 LAB — ETHANOL: Alcohol, Ethyl (B): <11 mg/dL (ref 0–11)

## 2010-09-19 LAB — DIGOXIN LEVEL: Digoxin Level: 0.7 ng/mL — ABNORMAL LOW (ref 0.8–2.0)

## 2010-09-19 LAB — APTT: aPTT: 33 seconds (ref 24–37)

## 2010-09-20 ENCOUNTER — Encounter: Payer: Self-pay | Admitting: Internal Medicine

## 2010-09-20 DIAGNOSIS — F339 Major depressive disorder, recurrent, unspecified: Secondary | ICD-10-CM

## 2010-09-20 NOTE — Assessment & Plan Note (Signed)
BP Readings from Last 3 Encounters:  09/15/10 120/80  08/25/10 120/60  06/30/10 120/62   stable overall by hx and exam, most recent data reviewed with pt, and pt to continue medical treatment as before

## 2010-09-20 NOTE — Assessment & Plan Note (Signed)
Verified nonsuicidal, Continue all other medications as before, consider f/u with [facility] or [facility]

## 2010-09-20 NOTE — Progress Notes (Signed)
Subjective:    Patient ID: [name], male    DOB: [birth year], 77 y.o.   MRN: [id number]  HPI  Here to f/u with c/o ongoing and ? Worsening anxiety and panic in the past week for unclear reasons, has ongoing stressors per pt, Denies worsening depressive symptoms, suicidal ideation, though has been hospnd several times with suicidal ideation about 2000, and also forthright about his hx of valium dependence in the past.  Pt denies chest pain, increased sob or doe, wheezing, orthopnea, PND, increased LE swelling, palpitations, dizziness or syncope.  Pt denies new neurological symptoms such as new headache, or facial or extremity weakness or numbness   Pt denies polydipsia, polyuria.  Overall good compliance with treatment, and good medicine tolerability.  Does not seen psychiatry due to cost recently.  Now with improved RA pain recently on prednisone 5 bid Past Medical History  Diagnosis Date  . Atrial flutter     s/p ablation for RVR  03/2009, pradaxa ongoing  . VENOUS INSUFFICIENCY, LEFT LEG   . PULMONARY FIBROSIS, INTERSTITIAL   . OBSTRUCTIVE SLEEP APNEA   . BENIGN POSITIONAL VERTIGO   . HYPERGLYCEMIA 12/30/2009  . Interstitial lung disease   . ALLERGIC RHINITIS   . Rheumatoid arthritis   . Chronic CHF   . PERIPHERAL NEUROPATHY   . HYPERTENSION   . HYPERLIPIDEMIA   . DEPRESSION   . Anxiety 09/15/2010  . Depression 09/15/2010   Past Surgical History  Procedure Date  . 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 His smoking use included Cigarettes. He has a 3 pack-year smoking history. He has never used smokeless tobacco. He reports that he does not drink alcohol or use illicit drugs. family history is not on file. Allergies  Allergen Reactions  . Methotrexate     REACTION: ONLY HAS ONE KIDNEY   Current Outpatient Prescriptions on File Prior to Visit  Medication Sig Dispense  Refill  . Ascorbic Acid (VITAMIN C) 500 MG tablet Take 500 mg by mouth daily.        Marland Kitchen aspirin 81 MG tablet Take 81 mg by mouth daily.        . Cholecalciferol (VITAMIN D3) 1000 UNITS CAPS Take 1 capsule by mouth.        . Chromium Picolinate 500 MCG TABS Take by mouth daily.        . Coral Calcium 1000 (390 CA) MG TABS Take 2 capsules by mouth.        . dextromethorphan-guaiFENesin (MUCINEX DM) 30-600 MG per 12 hr tablet Take 1 tablet by mouth every 12 (twelve) hours.        . digoxin (LANOXIN) 0.25 MG tablet TAKE 1 TABLET BY MOUTH ONCE A DAY  30 tablet  5  . diltiazem (CARDIZEM CD) 180 MG 24 hr capsule Take 1 capsule (180 mg total) by mouth daily.  30 capsule  0  . diphenhydrAMINE (BENADRYL) 25 mg capsule Take 25 mg by mouth as needed.        . folic acid (FOLVITE) 800 MCG tablet Take 400 mcg by mouth daily.        . hydrocodone-acetaminophen (LORCET-HD) 5-500 MG per capsule Take 1 capsule by mouth every 6 (six) hours as needed.        . metoprolol tartrate (LOPRESSOR) 25 MG tablet Take 25 mg by mouth 2 (two)  times daily. Take 1/2 tablet twice a day       . NON FORMULARY Gochi Juice drink 1 oz once a day       . NON FORMULARY Urinozinc Prostate formula take2 tablet once a day      . PRADAXA 150 MG CAPS TAKE 1 CAPSULE TWICE A DAY  60 capsule  5  . vitamin B-12 (CYANOCOBALAMIN) 500 MCG tablet Take 500 mcg by mouth daily.        . Multiple Vitamin (MULTIVITAMIN) tablet Take 1 tablet by mouth daily.         Review of Systems Review of Systems  Constitutional: Negative for diaphoresis and unexpected weight change.  HENT: Negative for drooling and tinnitus.   Eyes: Negative for photophobia and visual disturbance.  Respiratory: Negative for choking and stridor.   Gastrointestinal: Negative for vomiting and blood in stool.  Genitourinary: Negative for hematuria and decreased urine volume.       Objective:   Physical Exam BP 120/80  Pulse 62  Temp(Src) 97.6 F (36.4 C) (Oral)  Ht 5\' 8"   (1.727 m)  Wt 209 lb 6 oz (94.972 kg)  BMI 31.84 kg/m2  SpO2 97% Physical Exam  VS noted Constitutional: Pt appears well-developed and well-nourished.  HENT: Head: Normocephalic.  Right Ear: External ear normal.  Left Ear: External ear normal.  Eyes: Conjunctivae and EOM are normal. Pupils are equal, round, and reactive to light.  Neck: Normal range of motion. Neck supple.  Cardiovascular: Normal rate and regular rhythm.   Pulmonary/Chest: Effort normal and breath sounds normal.  Abd:  Soft, NT, non-distended, + BS Neurological: Pt is alert. No cranial nerve deficit.  Skin: Skin is warm. No erythema.  Psychiatric: Thought content normal. 2+ nervous, somewhat dysphoric       Assessment & Plan:

## 2010-09-20 NOTE — Assessment & Plan Note (Signed)
With hx of benzo dependence, will try to avoid for now, tx with vistaril prn,  to f/u any worsening symptoms or concerns

## 2010-09-20 NOTE — Assessment & Plan Note (Signed)
For tx with ambien prn, to f/u any worsening symptoms or concerns

## 2010-09-22 LAB — DIFFERENTIAL
Eosinophils Absolute: 0.2 10*3/uL (ref 0.0–0.7)
Eosinophils Relative: 1 % (ref 0–5)
Lymphs Abs: 1.8 10*3/uL (ref 0.7–4.0)
Monocytes Absolute: 1 10*3/uL (ref 0.1–1.0)
Monocytes Relative: 7 % (ref 3–12)

## 2010-09-22 LAB — CBC
MCH: 31.3 pg (ref 26.0–34.0)
MCV: 94.4 fL (ref 78.0–100.0)
Platelets: 376 10*3/uL (ref 150–400)
RDW: 15.9 % — ABNORMAL HIGH (ref 11.5–15.5)
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
## 2010-09-23 LAB — COMPREHENSIVE METABOLIC PANEL
AST: 27 U/L (ref 0–37)
Albumin: 3.7 g/dL (ref 3.5–5.2)
BUN: 27 mg/dL — ABNORMAL HIGH (ref 6–23)
Calcium: 9.8 mg/dL (ref 8.4–10.5)
Creatinine, Ser: 1.11 mg/dL (ref 0.50–1.35)
GFR calc non Af Amer: >60 mL/min (ref >60)

## 2010-09-23 LAB — T4, FREE: Free T4: 1.13 ng/dL (ref 0.80–1.80)

## 2010-09-23 LAB — VITAMIN B12: Vitamin B-12: 789 pg/mL (ref 211–911)

## 2010-09-23 LAB — TSH: TSH: 1.532 u[IU]/mL (ref 0.350–4.500)

## 2010-09-23 LAB — CARDIAC PANEL(CRET KIN+CKTOT+MB+TROPI): Total CK: 43 U/L (ref 7–232)

## 2010-09-23 LAB — FOLATE: Folate: >20 ng/mL

## 2010-09-23 LAB — DIGOXIN LEVEL: Digoxin Level: 0.6 ng/mL — ABNORMAL LOW (ref 0.8–2.0)

## 2010-09-24 LAB — CARDIAC PANEL(CRET KIN+CKTOT+MB+TROPI)
CK, MB: 3.4 ng/mL (ref 0.3–4.0)
CK, MB: 3.7 ng/mL (ref 0.3–4.0)
Total CK: 34 U/L (ref 7–232)
Troponin I: <0.3 ng/mL (ref <0.30)

## 2010-09-27 LAB — PSA: PSA: 5.04 ng/mL — ABNORMAL HIGH (ref <=4.00)

## 2010-09-29 LAB — BASIC METABOLIC PANEL
CO2: 29 mEq/L (ref 19–32)
Calcium: 9.3 mg/dL (ref 8.4–10.5)
Chloride: 100 mEq/L (ref 96–112)
Glucose, Bld: 129 mg/dL — ABNORMAL HIGH (ref 70–99)
Sodium: 138 mEq/L (ref 135–145)

## 2010-09-30 ENCOUNTER — Ambulatory Visit (HOSPITAL_COMMUNITY)
Admit: 2010-09-30 | Discharge: 2010-09-30 | Disposition: A | Discharge status: Home / Self Care | Payer: Medicare Other | Attending: Psychiatry | Admitting: Psychiatry

## 2010-09-30 DIAGNOSIS — F29 Unspecified psychosis not due to a substance or known physiological condition: Secondary | ICD-10-CM | POA: Insufficient documentation

## 2010-09-30 DIAGNOSIS — R4182 Altered mental status, unspecified: Secondary | ICD-10-CM | POA: Insufficient documentation

## 2010-09-30 DIAGNOSIS — R29898 Other symptoms and signs involving the musculoskeletal system: Secondary | ICD-10-CM | POA: Insufficient documentation

## 2010-09-30 DIAGNOSIS — R269 Unspecified abnormalities of gait and mobility: Secondary | ICD-10-CM | POA: Insufficient documentation

## 2010-09-30 DIAGNOSIS — R413 Other amnesia: Secondary | ICD-10-CM | POA: Insufficient documentation

## 2010-09-30 DIAGNOSIS — R45851 Suicidal ideations: Secondary | ICD-10-CM | POA: Insufficient documentation

## 2010-09-30 DIAGNOSIS — R259 Unspecified abnormal involuntary movements: Secondary | ICD-10-CM | POA: Insufficient documentation

## 2010-09-30 MED ORDER — GADOBENATE DIMEGLUMINE 529 MG/ML IV SOLN
18.0000 mL | Freq: Once | INTRAVENOUS | Status: AC | PRN
  Administered 2010-09-30: 18 mL via INTRAVENOUS

## 2010-10-01 LAB — COMPREHENSIVE METABOLIC PANEL
ALT: 30 U/L (ref 0–53)
BUN: 16 mg/dL (ref 6–23)
CO2: 25 mEq/L (ref 19–32)
Calcium: 9.1 mg/dL (ref 8.4–10.5)
Creatinine, Ser: 0.77 mg/dL (ref 0.50–1.35)
GFR calc Af Amer: >60 mL/min (ref >60)
GFR calc non Af Amer: >60 mL/min (ref >60)
Glucose, Bld: 83 mg/dL (ref 70–99)
Sodium: 139 mEq/L (ref 135–145)
Total Protein: 6.5 g/dL (ref 6.0–8.3)

## 2010-10-01 LAB — DIFFERENTIAL
Basophils Relative: 1 % (ref 0–1)
Lymphocytes Relative: 19 % (ref 12–46)
Lymphs Abs: 2.1 10*3/uL (ref 0.7–4.0)
Monocytes Absolute: 0.7 10*3/uL (ref 0.1–1.0)
Monocytes Relative: 7 % (ref 3–12)
Neutro Abs: 7.5 10*3/uL (ref 1.7–7.7)
Neutrophils Relative %: 70 % (ref 43–77)

## 2010-10-01 LAB — CBC
HCT: 44.7 % (ref 39.0–52.0)
Hemoglobin: 15.1 g/dL (ref 13.0–17.0)
MCH: 32 pg (ref 26.0–34.0)
MCHC: 33.8 g/dL (ref 30.0–36.0)
MCV: 94.7 fL (ref 78.0–100.0)
RBC: 4.72 MIL/uL (ref 4.22–5.81)

## 2010-10-08 NOTE — Consult Note (Signed)
NAMETRYONE, KILLE NO.:  [id number]  MEDICAL RECORD NO.:  [id number]  LOCATION:  OUT                           FACILITY:  [facility]  PHYSICIAN:  Bevelyn Buckles. Jarren Para, MDDATE OF BIRTH:  Feb 18, 1931  DATE OF CONSULTATION: DATE OF DISCHARGE:                                CONSULTATION   REASON FOR CONSULTATION:  Clearance for behavioral health.  [name] is a 77 year old male with a history of atrial fib/flutter and previous nonischemic cardiomyopathy who came to the emergency room today complaining of anxiety and depression after his girlfriend left.  He is being admitted to [facility], but based on his history of atrial fibrillation, clearance was requested.  In reviewing his past medical history, he apparently was diagnosed with atrial flutter and heart failure in March 2011.  At that time, his EF was decreased with an EF of 20-25%.  This was thought to be related to atrial flutter with rapid ventricular response.  He underwent cardiac catheterization, which showed normal coronary arteries with an ejection fraction of 40-45%.  He subsequently underwent atrial flutter ablation by Dr. Lewayne Bunting on April 09, 2009.  After the procedure, the atrial flutter was terminated, but he then went into atrial fibrillation.  He was cardioverted back to sinus rhythm.  On September 2011, he underwent resection of right shoulder cyst and had postop atrial fibrillation, which was managed with rate control. Apparently, he has been in chronic atrial fibrillation.  Since that time, he has been well rate controlled with digoxin, diltiazem, and Lopressor.  He currently denies any palpitations.  No chest pain or shortness of breath.  No lower extremity edema.  No heart failure symptoms.  He has also been taking Pradaxa without any significant bleeding.  REVIEW OF SYSTEMS:  Remainder of the review of systems are all systems are negative except for HPI and problem list.  PAST  MEDICAL HISTORY: 1. History of atrial flutter, status post atrial flutter ablation. 2. Chronic atrial fibrillation. 3. History of tachycardic-induced cardiomyopathy, previous ejection     fraction of 20-25%.     a.     Most recent echocardiogram, September 2011, showed an      ejection fraction of 60-65%. 4. Normal coronary arteries by cardiac catheterization in March 2011. 5. Depression/anxiety. 6. Hypertension. 7. Rheumatoid arthritis with chronic interstitial lung disease and     pulmonary fibrosis. 8. Obesity. 9. Hypertension.  MEDICATIONS:  Include: 1. Vitamins. 2. Chromium. 3. Aspirin 81. 4. Vitamin C. 5. Vitamin B12. 6. Multivitamins. 7. Folic acid. 8. Metoprolol 12.5 b.i.d. 9. Hydroxyzine 50 mg three times a day. 10.Calcium. 11.Pradaxa 150 b.i.d. 12.Prednisone 10 mg a day. 13.Lasix 40 mg a day. 14.Digoxin 0.25 a day. 15.Percocet. 16.Ambien. 17.Celexa. 18.Diltiazem 180 mg a day.  SOCIAL HISTORY:  He is retired.  He is single.  He denies tobacco or alcohol abuse.  FAMILY HISTORY:  Father died at age 89 with myocardial infarction. Mother died at 40 of natural causes.  PHYSICAL EXAM:  GENERAL:  He is depressed with a flat affect. VITAL SIGNS:  Respirations are unlabored.  Blood pressure is 150/80, heart rate is 95 and irregular.  Saturations are normal. HEENT:  Normal. NECK:  Supple.  JVP is about 6 cm of water.  Carotids are 2+ bilaterally without bruits.  There is no lymphadenopathy or thyromegaly. CARDIAC:  Irregularly irregular rhythm with no murmurs, rubs or gallops. LUNGS:  Mild crackles throughout. ABDOMEN:  Soft, nontender, nondistended.  No hepatosplenomegaly. EXTREMITIES:  Warm with no cyanosis or clubbing.  No trace edema.  Pedal pulses are normal. NEUROLOGIC:  He is alert and oriented x3.  Cranial nerves II-XII are intact.  He has a flat affect.  EKG shows atrial fibrillation with a rate of 92.  Labs shows sodium 133, potassium 3.8, BUN 15,  creatinine 0.78.  Digoxin is 0.7.  Alcohol level is 0.  CBC:  White count 11.2, hemoglobin 14.4, platelet 322.  Urine drug screen is negative.  ASSESSMENT: 1. Chronic atrial fibrillation. 2. Normal coronary arteries by cardiac catheterization. 3. Hypertension. 4. Situational anxiety and depression.  PLAN/DISCUSSION:  From a cardiac point-of-view, he is quite stable.  We will continue on him his current medicines.  If needed, can increase his diltiazem to 240 mg a day just to make sure his ventricular rate stays under 100.  We appreciate the consult.  Please do not hesitate to call with any questions.     Bevelyn Buckles. Leianne Callins, MD     DRB/MEDQ  D:  09/19/2010  T:  09/19/2010  Job:  [id number]  Electronically Signed by Arvilla Meres MD on 10/08/2010 10:48:09 PM
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
## 2010-11-17 ENCOUNTER — Ambulatory Visit: Payer: Medicare Other | Admitting: Internal Medicine

## 2010-11-17 DIAGNOSIS — Z0289 Encounter for other administrative examinations: Secondary | ICD-10-CM

## 2010-12-22 ENCOUNTER — Ambulatory Visit (INDEPENDENT_AMBULATORY_CARE_PROVIDER_SITE_OTHER): Payer: Medicare Other | Admitting: Internal Medicine

## 2010-12-22 ENCOUNTER — Encounter: Payer: Self-pay | Admitting: Internal Medicine

## 2010-12-22 DIAGNOSIS — J84111 Idiopathic interstitial pneumonia, not otherwise specified: Secondary | ICD-10-CM

## 2010-12-22 DIAGNOSIS — I10 Essential (primary) hypertension: Secondary | ICD-10-CM

## 2010-12-22 DIAGNOSIS — H8110 Benign paroxysmal vertigo, unspecified ear: Secondary | ICD-10-CM

## 2010-12-22 DIAGNOSIS — F329 Major depressive disorder, single episode, unspecified: Secondary | ICD-10-CM

## 2010-12-22 MED ORDER — MECLIZINE HCL 25 MG PO TABS: 25.0000 mg | ORAL_TABLET | Freq: Three times a day (TID) | ORAL | Status: DC | PRN

## 2010-12-22 NOTE — Assessment & Plan Note (Signed)
BP Readings from Last 3 Encounters:  12/22/10 120/62  09/15/10 120/80  08/25/10 120/60   The current medical regimen is effective;  continue present plan and medications.

## 2010-12-22 NOTE — Assessment & Plan Note (Signed)
Hx same in past - ongoing severe symptoms for last 3 weeks 12/2009 and 04/2010 episodes resolved with meclizine - will retreat with same now MRI brain 09/2010: Largely unremarkable for age, no mass If unimproved symptoms with meds, consider vestibular rehab refer

## 2010-12-22 NOTE — Assessment & Plan Note (Signed)
Behavioral health hospitalization August 2012 reviewed  Today verified nonsuicidal, Continue all other medications as before

## 2010-12-22 NOTE — Assessment & Plan Note (Signed)
Follows with pulmonary for same -no acute pulmonary issues Note PFTs ordered August 2012 never performed; will defer to pulmonary if need to repeat at this time The current medical regimen is effective;  continue present plan and medications.

## 2010-12-22 NOTE — Patient Instructions (Signed)
It was good to see you today. His meclizine pills 3 times a day as needed for dizziness symptoms -Your prescription(s) have been submitted to your pharmacy. Please take as directed and contact our office if you believe you are having problem(s) with the medication(s). If continued balance problems or dizziness despite this medication, call for referral to vestibular rehabilitation as we discussed Other Medications reviewed, no changes at this time. Please schedule followup in 3-4 months, call sooner if problems.

## 2010-12-22 NOTE — Progress Notes (Signed)
Subjective:    Patient ID: [name], male    DOB: 1931/11/20, 77 y.o.   MRN: [id number]  HPI Here with complains of dizziness Onset 3 weeks ago no headache, toothache or ear pain - no tinnitus  worse with any head movement or change position lying>sitting, better with still position  History of same 12/2009 no head trauma or vision change, no fever   Also reviewed chronic medical issues:  pulm fibrosis - idopathic and ?amio component - follows with pulm for same - deneis SOB or DOE at this time - reports compliance with meds - no O2 needs   A fib/flutter - hosp with same and [facility] CHF 10/2010 - s/p cardioversion and ablation 03/2009 = sinus maintained, off amio due to above - denies palpitations or edema changes - compliants with meds as rx'd and follows with dr. [place] - initially on pradaxa but no anticoag rec at this time   RA - follows with rheum for same - on arava since start of 2011, denies adv effects from same, pain and mobility much improved -   OSA - intermittent CPAP use - believes weight loss which has occured since 03/2009 hosp has helped his OSA symptoms so CPAP "not necessary most of the time"   chronic neuropathic pain - burning and numbness affects hands and feet, no recent change in symptoms - controlled with gabapentin - needs refill   Mild hyperglycemia 2011 on labs at dr. [place] (cards) - random 130s -  hx "borderline sugar" in past  no recent steroids for arthritis (RA) or pulm flares  Continued overindulgence in sweets at night and snacks   Past Medical History  Diagnosis Date  . Atrial flutter     s/p ablation for RVR  03/2009, pradaxa ongoing  . VENOUS INSUFFICIENCY, LEFT LEG   . PULMONARY FIBROSIS, INTERSTITIAL   . OBSTRUCTIVE SLEEP APNEA   . BENIGN POSITIONAL VERTIGO   . HYPERGLYCEMIA 2011  . Interstitial lung disease   . ALLERGIC RHINITIS   . Rheumatoid arthritis   . Chronic CHF   . PERIPHERAL NEUROPATHY   . HYPERTENSION   . HYPERLIPIDEMIA    . DEPRESSION   . Anxiety   . Depression     Review of Systems  Constitutional: Negative for fever and fatigue.  HENT: Negative for hearing loss, ear pain and ear discharge.   Respiratory: Negative for cough and shortness of breath.   Cardiovascular: Negative for chest pain and palpitations.       Objective:   Physical Exam BP 120/62  Pulse 53  Temp(Src) 97.6 F (36.4 C) (Oral)  Wt 213 lb 6.4 oz (96.798 kg)  SpO2 97% Constitutional:  He appears well-developed and well-nourished. No distress.  HENT: scarring of R TM, L TM clear - no erythema on either - op clear Eyes - no resting nystgmus Neck: Normal range of motion. Neck supple. No JVD present. No thyromegaly present.  Cardiovascular: Normal rate, regular rhythm and normal heart sounds.  No murmur heard. no BLE edema Pulmonary/Chest: Effort normal and breath sounds normal. No respiratory distress. no wheezes.  Abdominal: Soft. Bowel sounds are normal. Patient exhibits no distension. There is no tenderness. Neuro: AAxO3 - CN 2-12 symmetrically intact - MAE, normal F>N and negative Romberg; gait normal with aide of RW assist Psychiatric: he has a normal mood and affect. behavior is normal. Judgment and thought content normal.   Lab Results  Component Value Date   WBC 10.7* 10/01/2010   HGB  15.1 10/01/2010   HCT 44.7 10/01/2010   PLT 275 10/01/2010   GLUCOSE 83 10/01/2010   ALT 30 10/01/2010   AST 18 10/01/2010   NA 139 10/01/2010   K 4.1 10/01/2010   CL 102 10/01/2010   CREATININE 0.77 10/01/2010   BUN 16 10/01/2010   CO2 25 10/01/2010   TSH 1.532 09/22/2010   PSA 5.04* 09/26/2010   INR 1.14 09/19/2010   HGBA1C 6.3 06/30/2010        Assessment & Plan:   See problem list. Medications and labs reviewed today.

## 2010-12-29 ENCOUNTER — Ambulatory Visit: Payer: Medicare Other | Admitting: Internal Medicine

## 2011-01-16 ENCOUNTER — Telehealth: Payer: Self-pay | Admitting: *Deleted

## 2011-01-16 MED ORDER — FOLIC ACID 800 MCG PO TABS: 800.0000 ug | ORAL_TABLET | Freq: Every day | ORAL | Status: DC

## 2011-01-16 MED ORDER — MECLIZINE HCL 25 MG PO TABS: 25.0000 mg | ORAL_TABLET | Freq: Three times a day (TID) | ORAL | Status: DC | PRN

## 2011-01-16 MED ORDER — DIGOXIN 250 MCG PO TABS: 250.0000 ug | ORAL_TABLET | Freq: Every day | ORAL | Status: DC

## 2011-01-16 MED ORDER — METOPROLOL TARTRATE 25 MG PO TABS: 12.5000 mg | ORAL_TABLET | Freq: Two times a day (BID) | ORAL | Status: DC

## 2011-01-16 MED ORDER — DILTIAZEM HCL ER COATED BEADS 180 MG PO CP24: 180.0000 mg | ORAL_CAPSULE | Freq: Every day | ORAL | Status: DC

## 2011-01-16 MED ORDER — HYDROXYZINE HCL 50 MG PO TABS: 50.0000 mg | ORAL_TABLET | Freq: Three times a day (TID) | ORAL | Status: DC | PRN

## 2011-01-16 NOTE — Telephone Encounter (Signed)
Left msg on vm pt is needing new rx's on his zoloft 50mg , hydralazine 50mg , Namenda 10mg . Digoxin, clonazepam 0.5mg , diltiazem 180 mg, buspar 5mg , effexor 150mg , metoprolol 25 and folic acid. All these prescription was filled somewhere else pt is need new scripts call into pharmacy....01/16/11@3 :27pm/LMB

## 2011-01-16 NOTE — Telephone Encounter (Signed)
Notified pharmacy spoke with pharmacist md only approve med that she rx. Zoloft, Namenda, clonazepam and effexor pt should contact psychiatrist to have refill. Sent all other meds threw e-script....01/16/11@4 :10pm/LMB

## 2011-01-17 ENCOUNTER — Other Ambulatory Visit: Payer: Self-pay | Admitting: Physician Assistant

## 2011-01-17 ENCOUNTER — Telehealth: Payer: Self-pay | Admitting: Physician Assistant

## 2011-01-17 DIAGNOSIS — I4892 Unspecified atrial flutter: Secondary | ICD-10-CM

## 2011-01-17 MED ORDER — DABIGATRAN ETEXILATE MESYLATE 150 MG PO CAPS: 150.0000 mg | ORAL_CAPSULE | Freq: Two times a day (BID) | ORAL | Status: DC

## 2011-01-17 MED ORDER — DILTIAZEM HCL ER COATED BEADS 180 MG PO CP24: 180.0000 mg | ORAL_CAPSULE | Freq: Every day | ORAL | Status: DC

## 2011-01-17 MED ORDER — METOPROLOL TARTRATE 25 MG PO TABS: 12.5000 mg | ORAL_TABLET | Freq: Two times a day (BID) | ORAL | Status: DC

## 2011-01-17 MED ORDER — DIGOXIN 250 MCG PO TABS: 250.0000 ug | ORAL_TABLET | Freq: Every day | ORAL | Status: DC

## 2011-01-17 NOTE — Telephone Encounter (Signed)
Patient called answering service for medication refills. I have sent in 30 day supply for: Digoxin Diltiazem Metoprolol Pradaxa  Please call patient to arrange follow up if needed and further refills as necessary. Tereso Newcomer, PA-C  2:38 PM 01/17/2011

## 2011-01-19 NOTE — Telephone Encounter (Signed)
N/A.  LMTC. 

## 2011-01-21 NOTE — Telephone Encounter (Signed)
Will forward to Anabel Halon, LPN. It looks like the patient no showed for an appointment in August with Dr. [place]. He will most likely need a follow up scheduled with Dr. [place].

## 2011-01-22 ENCOUNTER — Encounter: Payer: Self-pay | Admitting: Cardiology

## 2011-01-22 ENCOUNTER — Ambulatory Visit (INDEPENDENT_AMBULATORY_CARE_PROVIDER_SITE_OTHER): Payer: Medicare Other | Admitting: Cardiology

## 2011-01-22 VITALS — BP 132/76 | HR 56 | Ht 68.0 in | Wt 216.6 lb

## 2011-01-22 DIAGNOSIS — I4892 Unspecified atrial flutter: Secondary | ICD-10-CM

## 2011-01-22 DIAGNOSIS — Z7901 Long term (current) use of anticoagulants: Secondary | ICD-10-CM

## 2011-01-22 DIAGNOSIS — I5022 Chronic systolic (congestive) heart failure: Secondary | ICD-10-CM

## 2011-01-22 DIAGNOSIS — I482 Chronic atrial fibrillation, unspecified: Secondary | ICD-10-CM | POA: Insufficient documentation

## 2011-01-22 DIAGNOSIS — I4891 Unspecified atrial fibrillation: Secondary | ICD-10-CM

## 2011-01-22 NOTE — Assessment & Plan Note (Signed)
No evidence of congestive heart failure at this time. His last echocardiogram showed normal LV function.

## 2011-01-22 NOTE — Progress Notes (Signed)
[name] Date of Birth: 07-24-1931 Medical Record #[id number]  History of Present Illness: [name] is seen today for followup of atrial fibrillation. He was last seen over a year ago. He has a history of atrial fibrillation and flutter. He has had a prior flutter ablation. He has a history of congestive heart failure related to tachycardia mediated cardiomyopathy. His last ejection fraction in September of 2011 was 60-65%. He reports a lot of difficulty with depression this year. He was hospitalized in August with a "nervous breakdown". He is now living in an apartment. He reports a lot of changes in his medications. He is still taking Pradaxa. He has been on rate control therapy with diltiazem, digoxin, and metoprolol. He denies any dizziness or syncope. He denies any chest pain, shortness of breath, or palpitations.  Current Outpatient Prescriptions on File Prior to Visit  Medication Sig Dispense Refill  . Ascorbic Acid (VITAMIN C) 500 MG tablet Take 500 mg by mouth daily.        Marland Kitchen aspirin 81 MG tablet Take 81 mg by mouth daily.        . Cholecalciferol (VITAMIN D3) 1000 UNITS CAPS Take 1 capsule by mouth.        . Chromium Picolinate 500 MCG TABS Take by mouth daily.        . Coral Calcium 1000 (390 CA) MG TABS Take 2 capsules by mouth.        . dabigatran (PRADAXA) 150 MG CAPS Take 1 capsule (150 mg total) by mouth every 12 (twelve) hours.  60 capsule  0  . dextromethorphan-guaiFENesin (MUCINEX DM) 30-600 MG per 12 hr tablet Take 1 tablet by mouth as needed.       . digoxin (LANOXIN) 0.25 MG tablet Take 1 tablet (250 mcg total) by mouth daily.  30 tablet  0  . diltiazem (CARDIZEM CD) 180 MG 24 hr capsule Take 1 capsule (180 mg total) by mouth daily.  30 capsule  0  . diphenhydrAMINE (BENADRYL) 25 mg capsule Take 25 mg by mouth as needed.        . folic acid (FOLVITE) 800 MCG tablet Take 1 tablet (800 mcg total) by mouth daily.  30 tablet  5  . hydrOXYzine (ATARAX/VISTARIL) 50 MG  tablet Take 1 tablet (50 mg total) by mouth 3 (three) times daily as needed.  90 tablet  0  . meclizine (ANTIVERT) 25 MG tablet Take 1 tablet (25 mg total) by mouth 3 (three) times daily as needed for dizziness or nausea.  30 tablet  5  . metoprolol tartrate (LOPRESSOR) 25 MG tablet Take 0.5 tablets (12.5 mg total) by mouth 2 (two) times daily. Take 1/2 tablet twice a day  60 tablet  0  . Multiple Vitamin (MULTIVITAMIN) tablet Take 1 tablet by mouth daily.        . NON FORMULARY Urinozinc Prostate formula take2 tablet once a day      . predniSONE (DELTASONE) 5 MG tablet Take 5 mg by mouth 2 (two) times daily.        . vitamin B-12 (CYANOCOBALAMIN) 500 MCG tablet Take 500 mcg by mouth daily.          Allergies  Allergen Reactions  . Methotrexate     REACTION: ONLY HAS ONE KIDNEY    Past Medical History  Diagnosis Date  . Atrial flutter     s/p ablation for RVR  03/2009, pradaxa ongoing  . VENOUS INSUFFICIENCY, LEFT LEG   .  PULMONARY FIBROSIS, INTERSTITIAL   . OBSTRUCTIVE SLEEP APNEA   . BENIGN POSITIONAL VERTIGO   . HYPERGLYCEMIA 2011  . Interstitial lung disease   . ALLERGIC RHINITIS   . Rheumatoid arthritis   . Chronic CHF   . PERIPHERAL NEUROPATHY   . HYPERTENSION   . HYPERLIPIDEMIA   . DEPRESSION   . Anxiety   . Depression     Past Surgical History  Procedure Date  . 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 Former Smoker -- 1.0 packs/day for 3 years  . Types: Cigarettes  . Quit date: 01/20/2000  Smokeless tobacco  . Never Used  Comment: Single, lives alone. 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 His family contact would be his sister [name] [phone]    History  Alcohol Use No    Former    History reviewed. No pertinent family history.  Review of Systems: The review of systems is positive for chronic depression.  All other systems were reviewed  and are negative.  Physical Exam: BP 132/76  Pulse 56  Ht 5\' 8"  (1.727 m)  Wt 216 lb 9.6 oz (98.249 kg)  BMI 32.93 kg/m2 He is an elderly white male in no acute distress. He appears anxious. His HEENT exam is unremarkable. Has no JVD or bruits. Lungs are clear. Cardiac exam reveals an irregular rate and rhythm with a soft systolic murmur at the apex. Abdomen is soft, nontender. He has trace to 1+ edema. Pedal pulses are good. He is alert and oriented x3. Cranial nerves II through XII are intact. LABORATORY DATA: ECG shows atrial fibrillation with a slow ventricular response and rate of 56 beats per minute. There is septal infarct age undetermined. He has ST and T wave changes consistent with inferior lateral ischemia versus dig effect.  Assessment / Plan:

## 2011-01-22 NOTE — Patient Instructions (Signed)
Stop taking ASA and metoprolol.  Continue your other medications.  I will see you again in 6 months.

## 2011-01-22 NOTE — Assessment & Plan Note (Signed)
Status post previous flutter ablation. Now in chronic atrial fibrillation.

## 2011-01-22 NOTE — Assessment & Plan Note (Addendum)
Heart rate is slow on current medications. I recommended stopping his metoprolol. We will continue with diltiazem and digoxin. He is on anticoagulation with Pradaxa. While on Pradaxa he should not be taking aspirin. I will followup again in 6 months.

## 2011-01-26 NOTE — Telephone Encounter (Signed)
Patient called, he just saw Dr.Jordan last week 01/22/11.Will see Dr.Jordan in 6 months.

## 2011-01-28 ENCOUNTER — Emergency Department (HOSPITAL_COMMUNITY)
Admission: EM | Admit: 2011-01-28 | Discharge: 2011-01-29 | Disposition: A | Discharge status: Other Acute Inpatient Hospital | Payer: Medicare Other | Attending: Emergency Medicine | Admitting: Emergency Medicine

## 2011-01-28 ENCOUNTER — Other Ambulatory Visit: Payer: Self-pay

## 2011-01-28 ENCOUNTER — Inpatient Hospital Stay (HOSPITAL_COMMUNITY)
Admission: RE | Admit: 2011-01-28 | Discharge: 2011-01-28 | DRG: 885 | Disposition: A | Discharge status: Home / Self Care | Payer: Medicare Other | Attending: Psychiatry | Admitting: Psychiatry

## 2011-01-28 DIAGNOSIS — F329 Major depressive disorder, single episode, unspecified: Secondary | ICD-10-CM

## 2011-01-28 DIAGNOSIS — I10 Essential (primary) hypertension: Secondary | ICD-10-CM | POA: Insufficient documentation

## 2011-01-28 DIAGNOSIS — Z5320 Procedure and treatment not carried out because of patient's decision for unspecified reasons: Secondary | ICD-10-CM | POA: Diagnosis present

## 2011-01-28 DIAGNOSIS — Z79899 Other long term (current) drug therapy: Secondary | ICD-10-CM | POA: Insufficient documentation

## 2011-01-28 DIAGNOSIS — F3289 Other specified depressive episodes: Secondary | ICD-10-CM | POA: Insufficient documentation

## 2011-01-28 DIAGNOSIS — I509 Heart failure, unspecified: Secondary | ICD-10-CM | POA: Insufficient documentation

## 2011-01-28 DIAGNOSIS — I4891 Unspecified atrial fibrillation: Secondary | ICD-10-CM

## 2011-01-28 LAB — RAPID URINE DRUG SCREEN, HOSP PERFORMED
Benzodiazepines: NOT DETECTED
Cocaine: NOT DETECTED
Opiates: POSITIVE — AB
Tetrahydrocannabinol: NOT DETECTED

## 2011-01-28 LAB — CBC
HCT: 42.1 % (ref 39.0–52.0)
Hemoglobin: 14.5 g/dL (ref 13.0–17.0)
MCHC: 34.4 g/dL (ref 30.0–36.0)
WBC: 10.4 10*3/uL (ref 4.0–10.5)

## 2011-01-28 LAB — POCT I-STAT, CHEM 8
BUN: 11 mg/dL (ref 6–23)
Calcium, Ion: 1.14 mmol/L (ref 1.12–1.32)
Chloride: 100 mEq/L (ref 96–112)
Glucose, Bld: 118 mg/dL — ABNORMAL HIGH (ref 70–99)
HCT: 46 % (ref 39.0–52.0)
TCO2: 26 mmol/L (ref 0–100)

## 2011-01-28 NOTE — ED Notes (Signed)
Patient received from the ed conference room to hold for medical clearance for [facility].

## 2011-01-28 NOTE — BH Assessment (Signed)
Assessment Note   [name] is an 78 y.o. male. Pt presented to  [facility] with depression and suicidal ideations, no plan but unable to contract for safety,  His klonoipin  Was discontinued by [facility] 3 days ago and pt feels like he is having som withdrawals.  He is also feeling suicidal today and confused. No Homicidal ideations and no psychosis.       Axis I: Major Depression, Recurrent severe Axis II: Deferred Axis III:  Past Medical History  Diagnosis Date  . Atrial flutter     s/p ablation for RVR  03/2009, pradaxa ongoing  . VENOUS INSUFFICIENCY, LEFT LEG   . PULMONARY FIBROSIS, INTERSTITIAL   . OBSTRUCTIVE SLEEP APNEA   . BENIGN POSITIONAL VERTIGO   . HYPERGLYCEMIA 2011  . Interstitial lung disease   . ALLERGIC RHINITIS   . Rheumatoid arthritis   . Chronic CHF   . PERIPHERAL NEUROPATHY   . HYPERTENSION   . HYPERLIPIDEMIA   . DEPRESSION   . Anxiety   . Depression    Axis IV: problems with access to health care services Axis V: 21-30 behavior considerably influenced by delusions or hallucinations OR serious impairment in judgment, communication OR inability to function in almost all areas  Past Medical History:  Past Medical History  Diagnosis Date  . Atrial flutter     s/p ablation for RVR  03/2009, pradaxa ongoing  . VENOUS INSUFFICIENCY, LEFT LEG   . PULMONARY FIBROSIS, INTERSTITIAL   . OBSTRUCTIVE SLEEP APNEA   . BENIGN POSITIONAL VERTIGO   . HYPERGLYCEMIA 2011  . Interstitial lung disease   . ALLERGIC RHINITIS   . Rheumatoid arthritis   . Chronic CHF   . PERIPHERAL NEUROPATHY   . HYPERTENSION   . HYPERLIPIDEMIA   . DEPRESSION   . Anxiety   . Depression     Past Surgical History  Procedure Date  . 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  Social History:  reports that he quit smoking about 11 years ago. His smoking use included Cigarettes. He has a  3 pack-year smoking history. He has never used smokeless tobacco. He reports that he does not drink alcohol or use illicit drugs.  Additional Social History:    Allergies:  Allergies  Allergen Reactions  . Methotrexate     REACTION: ONLY HAS ONE KIDNEY    Home Medications:  No current facility-administered medications on file as of 01/28/2011.   Medications Prior to Admission  Medication Sig Dispense Refill  . Ascorbic Acid (VITAMIN C) 500 MG tablet Take 500 mg by mouth daily.        Marland Kitchen aspirin 81 MG tablet Take 81 mg by mouth daily.        . busPIRone (BUSPAR) 5 MG tablet Take 5 mg by mouth 2 (two) times daily.        . Cholecalciferol (VITAMIN D3) 1000 UNITS CAPS Take 1 capsule by mouth.        . Chromium Picolinate 500 MCG TABS Take by mouth daily.        . Coral Calcium 1000 (390 CA) MG TABS Take 2 capsules by mouth.        . dabigatran (PRADAXA) 150 MG CAPS Take 1 capsule (150 mg total) by mouth every 12 (twelve) hours.  60 capsule  0  . dextromethorphan-guaiFENesin (MUCINEX DM) 30-600 MG  per 12 hr tablet Take 1 tablet by mouth as needed.       . digoxin (LANOXIN) 0.25 MG tablet Take 1 tablet (250 mcg total) by mouth daily.  30 tablet  0  . diltiazem (CARDIZEM CD) 180 MG 24 hr capsule Take 1 capsule (180 mg total) by mouth daily.  30 capsule  0  . diphenhydrAMINE (BENADRYL) 25 mg capsule Take 25 mg by mouth as needed.        . folic acid (FOLVITE) 800 MCG tablet Take 1 tablet (800 mcg total) by mouth daily.  30 tablet  5  . furosemide (LASIX) 40 MG tablet Take 40 mg by mouth daily.        . hydrOXYzine (ATARAX/VISTARIL) 50 MG tablet Take 1 tablet (50 mg total) by mouth 3 (three) times daily as needed.  90 tablet  0  . leflunomide (ARAVA) 10 MG tablet Take 10 mg by mouth daily.        . meclizine (ANTIVERT) 25 MG tablet Take 1 tablet (25 mg total) by mouth 3 (three) times daily as needed for dizziness or nausea.  30 tablet  5  . memantine (NAMENDA) 10 MG tablet Take 10 mg by mouth 2  (two) times daily.        . metoprolol tartrate (LOPRESSOR) 25 MG tablet Take 0.5 tablets (12.5 mg total) by mouth 2 (two) times daily. Take 1/2 tablet twice a day  60 tablet  0  . Multiple Vitamin (MULTIVITAMIN) tablet Take 1 tablet by mouth daily.        . NON FORMULARY Urinozinc Prostate formula take2 tablet once a day      . predniSONE (DELTASONE) 5 MG tablet Take 5 mg by mouth 2 (two) times daily.        . sertraline (ZOLOFT) 50 MG tablet Take 50 mg by mouth daily.        . vitamin B-12 (CYANOCOBALAMIN) 500 MCG tablet Take 500 mcg by mouth daily.          OB/GYN Status:  No LMP for male patient.  General Assessment Data Location of Assessment: [facility] Assessment Services ACT Assessment: Yes Living Arrangements: Alone Can pt return to current living arrangement?: Yes Admission Status: Voluntary Is patient capable of signing voluntary admission?: Yes Transfer from: Home Referral Source: Self/Family/Friend  Education Status Contact person: [facility] [phone]  Risk to self Suicidal Ideation: Yes-Currently Present Suicidal Intent: Yes-Currently Present Is patient at risk for suicide?: Yes Suicidal Plan?: No-Not Currently/Within Last 6 Months Access to Means: Yes Specify Access to Suicidal Means: anything What has been your use of drugs/alcohol within the last 12 months?: na Previous Attempts/Gestures: No Intentional Self Injurious Behavior: None Family Suicide History: No Recent stressful life event(s): Other (Comment) (change in medication) Persecutory voices/beliefs?: No Depression: Yes Depression Symptoms: Insomnia;Loss of interest in usual pleasures;Feeling worthless/self pity;Isolating Substance abuse history and/or treatment for substance abuse?: No Suicide prevention information given to non-admitted patients: Not applicable  Risk to Others Homicidal Ideation: No Thoughts of Harm to Others: No Current Homicidal Intent: No Current Homicidal  Plan: No Access to Homicidal Means: No History of harm to others?: No Assessment of Violence: None Noted Does patient have access to weapons?: No Criminal Charges Pending?: No Does patient have a court date: No  Psychosis Hallucinations: None noted Delusions: None noted  Mental Status Report Appear/Hygiene: Improved Eye Contact: Good Motor Activity: Unsteady Speech: Logical/coherent Level of Consciousness: Alert Mood: Depressed;Despair;Helpless;Sad;Worthless, low self-esteem Affect: Depressed Anxiety  Level: Severe Thought Processes: Coherent;Relevant Judgement: Impaired Orientation: Person;Place;Time;Situation Obsessive Compulsive Thoughts/Behaviors: None  Cognitive Functioning Concentration: Normal Memory: Recent Impaired;Remote Impaired IQ: Average Insight: Poor Impulse Control: Poor Appetite: Fair Sleep: Decreased (1 hr sleep last night) Total Hours of Sleep: 4  Vegetative Symptoms: None  Prior Inpatient Therapy Prior Inpatient Therapy: Yes Prior Therapy Dates: currently Prior Therapy Facilty/Provider(s): [facility] Reason for Treatment: depression  Prior Outpatient Therapy Prior Outpatient Therapy: Yes Prior Therapy Dates: [facility] Prior Therapy Facilty/Provider(s): [facility] Reason for Treatment: depression                     Additional Information 1:1 In Past 12 Months?: No CIRT Risk: No Elopement Risk: No Does patient have medical clearance?: No     Disposition: Discussed case with ac Tomi Likens and Dr Dan Humphreys accepts for admission room 504-2.    Disposition Disposition of Patient: Inpatient treatment program Type of inpatient treatment program: Adult  On Site Evaluation by:   Reviewed with Physician:     Hattie Perch Winford 01/28/2011 5:14 PM

## 2011-01-28 NOTE — ED Provider Notes (Signed)
History     CSN: [id number]  Arrival date & time 01/28/11  6213   First MD Initiated Contact with Patient 01/28/11 2215      Chief Complaint  Patient presents with  . Medical Clearance    (Consider location/radiation/quality/duration/timing/severity/associated sxs/prior treatment) The history is provided by the patient.   78 year old male who was referred here [facility] for medical clearance. He has been depressed with suicidal ideation. He has been accepted for admission at behavioral health following medical clearance. He admits to feeling depressed. He has had some sleep disturbance since she cannot get to sleep without sleeping medication. He denies crying spells and anhedonia. He also thinks he needs to withdraw from some of his medications. He denies hallucinations.  Past Medical History  Diagnosis Date  . Atrial flutter     s/p ablation for RVR  03/2009, pradaxa ongoing  . VENOUS INSUFFICIENCY, LEFT LEG   . PULMONARY FIBROSIS, INTERSTITIAL   . OBSTRUCTIVE SLEEP APNEA   . BENIGN POSITIONAL VERTIGO   . HYPERGLYCEMIA 2011  . Interstitial lung disease   . ALLERGIC RHINITIS   . Rheumatoid arthritis   . Chronic CHF   . PERIPHERAL NEUROPATHY   . HYPERTENSION   . HYPERLIPIDEMIA   . DEPRESSION   . Anxiety   . Depression     Past Surgical History  Procedure Date  . 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  History  Substance Use Topics  . Smoking status: Former Smoker -- 1.0 packs/day for 3 years    Types: Cigarettes    Quit date: 01/20/2000  . Smokeless tobacco: Never Used   Comment: Single, lives alone. 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 His family contact would be his sister [name] [phone]  . Alcohol Use: No     Former      Review of Systems  All other systems reviewed and are negative.    Allergies   Methotrexate  Home Medications   Current Outpatient Rx  Name Route Sig Dispense Refill  . VITAMIN C 500 MG PO TABS Oral Take 500 mg by mouth daily.      . BUSPIRONE HCL 5 MG PO TABS Oral Take 5 mg by mouth 2 (two) times daily.      Marland Kitchen VITAMIN D3 1000 UNITS PO CAPS Oral Take 1 capsule by mouth.      . CHROMIUM PICOLINATE 500 MCG PO TABS Oral Take by mouth daily.      Marland Kitchen CORAL CALCIUM 1000 (390 CA) MG PO TABS Oral Take 2 capsules by mouth.      Marland Kitchen DABIGATRAN ETEXILATE MESYLATE 150 MG PO CAPS Oral Take 1 capsule (150 mg total) by mouth every 12 (twelve) hours. 60 capsule 0  . DIGOXIN 0.25 MG PO TABS Oral Take 1 tablet (250 mcg total) by mouth daily. 30 tablet 0  . DILTIAZEM HCL ER COATED BEADS 180 MG PO CP24 Oral Take 1 capsule (180 mg total) by mouth daily. 30 capsule 0    Pt needs appt for future refills  . DIPHENHYDRAMINE HCL 25 MG PO CAPS Oral Take 25 mg by mouth as needed. Allergies    . FOLIC ACID 800 MCG PO TABS Oral Take 1 tablet (800 mcg total) by mouth daily. 30 tablet 5  . FUROSEMIDE 40 MG PO TABS Oral Take 40  mg by mouth daily.      Marland Kitchen HYDROCODONE-ACETAMINOPHEN 5-500 MG PO TABS Oral Take 1 tablet by mouth every 6 (six) hours as needed.    Marland Kitchen HYDROXYZINE HCL 50 MG PO TABS Oral Take 1 tablet (50 mg total) by mouth 3 (three) times daily as needed. 90 tablet 0  . LEFLUNOMIDE 10 MG PO TABS Oral Take 10 mg by mouth daily.      Marland Kitchen MECLIZINE HCL 25 MG PO TABS Oral Take 1 tablet (25 mg total) by mouth 3 (three) times daily as needed for dizziness or nausea. 30 tablet 5  . MEMANTINE HCL 10 MG PO TABS Oral Take 10 mg by mouth daily.     Marland Kitchen METOPROLOL TARTRATE 25 MG PO TABS Oral Take 0.5 tablets (12.5 mg total) by mouth 2 (two) times daily. Take 1/2 tablet twice a day 60 tablet 0  . ONE-DAILY MULTI VITAMINS PO TABS Oral Take 1 tablet by mouth daily.      . NON FORMULARY  Urinozinc Prostate formula take2 tablet once a day    . PREDNISONE 5 MG PO TABS Oral Take 5 mg by mouth daily.     . SERTRALINE  HCL 50 MG PO TABS Oral Take 50 mg by mouth daily.     Marland Kitchen VITAMIN B-12 500 MCG PO TABS Oral Take 500 mcg by mouth daily.        BP 147/67  Pulse 76  Temp(Src) 98.3 F (36.8 C) (Oral)  Resp 16  SpO2 98%  Physical Exam  Nursing note and vitals reviewed.  78 year old male who is resting comfortably and in no acute distress. Vital signs show mild systolic hypertension blood pressure 147/67. Oxygen saturation is 90% which is normal. Head is, cephalic and atraumatic. PERRLA, EOMI although his left eye deviates laterally consistent with amblyopia. Oropharynx is clear. Neck is supple without adenopathy or JVD. Lungs are clear without rales, wheezes, or rhonchi. Heart has regular rate and rhythm without murmur. Abdomen is soft, flat, nontender without masses or hepatosplenomegaly. Extremities have full range of motion no cyanosis or edema. Skin is warm and moist without rash. Neurologic: Mental status is normal except for depression. Cranial nerves are intact, there no focal motor or sensory deficits.  ED Course  Procedures (including critical care time)  Labs Reviewed  URINE RAPID DRUG SCREEN (HOSP PERFORMED) - Abnormal; Notable for the following:    Opiates POSITIVE (*)    All other components within normal limits  POCT I-STAT, CHEM 8 - Abnormal; Notable for the following:    Glucose, Bld 118 (*)    All other components within normal limits  CBC  I-STAT, CHEM 8  DIGOXIN LEVEL   No results found.  Results for orders placed during the hospital encounter of 01/28/11  CBC      Component Value Range   WBC 10.4  4.0 - 10.5 (K/uL)   RBC 4.38  4.22 - 5.81 (MIL/uL)   Hemoglobin 14.5  13.0 - 17.0 (g/dL)   HCT 65.7  84.6 - 96.2 (%)   MCV 96.1  78.0 - 100.0 (fL)   MCH 33.1  26.0 - 34.0 (pg)   MCHC 34.4  30.0 - 36.0 (g/dL)   RDW 95.2  84.1 - 32.4 (%)   Platelets 327  150 - 400 (K/uL)  URINE RAPID DRUG SCREEN (HOSP PERFORMED)      Component Value Range   Opiates POSITIVE (*) NONE DETECTED     Cocaine NONE DETECTED  NONE DETECTED  Benzodiazepines NONE DETECTED  NONE DETECTED    Amphetamines NONE DETECTED  NONE DETECTED    Tetrahydrocannabinol NONE DETECTED  NONE DETECTED    Barbiturates NONE DETECTED  NONE DETECTED   POCT I-STAT, CHEM 8      Component Value Range   Sodium 137  135 - 145 (mEq/L)   Potassium 4.3  3.5 - 5.1 (mEq/L)   Chloride 100  96 - 112 (mEq/L)   BUN 11  6 - 23 (mg/dL)   Creatinine, Ser 4.54  0.50 - 1.35 (mg/dL)   Glucose, Bld 098 (*) 70 - 99 (mg/dL)   Calcium, Ion 1.19  1.47 - 1.32 (mmol/L)   TCO2 26  0 - 100 (mmol/L)   Hemoglobin 15.6  13.0 - 17.0 (g/dL)   HCT 82.9  56.2 - 13.0 (%)  DIGOXIN LEVEL      Component Value Range   Digoxin Level 1.1  0.8 - 2.0 (ng/mL)   No results found.   No diagnosis found.   Date: 01/29/2011  Rate: 75  Rhythm: atrial fibrillation and premature ventricular contractions (PVC)  QRS Axis: normal  Intervals: normal  ST/T Wave abnormalities: nonspecific ST/T changes  Conduction Disutrbances:none  Narrative Interpretation: Atrial fibrillation with low-voltage and nonspecific ST and T changes. When compared with ECG of 09/23/2010, no significant changes are seen.  Old EKG Reviewed: unchanged  At this point, the patient is considered medically cleared to be transferred to behavioral health for psychiatric treatment. He had been accepted at [facility] behavioral health, but his girlfriend as a patient there and alternate placement will be needed.  MDM  Maj. depression. His basic labs have come back, but he also need a digoxin level and ECG before adequate medical clearance.        Dione Booze, MD 01/29/11 [facility]

## 2011-01-28 NOTE — ED Notes (Addendum)
Pt reports feeling like he is going to have "a nervous breakdown at any point," "feels like I am very uptight," pt admits to attempting to take medications to help w/ his insomnia w/o relief. Pt here for medical clearance in attempt to be admitted to [facility]. Pt denies any SI/HI

## 2011-01-29 MED ORDER — VITAMIN D3 25 MCG (1000 UNIT) PO TABS
1000.0000 [IU] | ORAL_TABLET | Freq: Every day | ORAL | Status: DC
  Administered 2011-01-29: 1000 [IU] via ORAL
  Filled 2011-01-29: qty 1

## 2011-01-29 MED ORDER — BUSPIRONE HCL 10 MG PO TABS
5.0000 mg | ORAL_TABLET | Freq: Two times a day (BID) | ORAL | Status: DC
  Administered 2011-01-29: 01:00:00 via ORAL
  Filled 2011-01-29: qty 1

## 2011-01-29 MED ORDER — DABIGATRAN ETEXILATE MESYLATE 150 MG PO CAPS
150.0000 mg | ORAL_CAPSULE | Freq: Two times a day (BID) | ORAL | Status: DC
  Administered 2011-01-29: 150 mg via ORAL
  Filled 2011-01-29 (×3): qty 1

## 2011-01-29 MED ORDER — SERTRALINE HCL 50 MG PO TABS
50.0000 mg | ORAL_TABLET | Freq: Every day | ORAL | Status: DC
  Administered 2011-01-29: 50 mg via ORAL
  Filled 2011-01-29: qty 1

## 2011-01-29 MED ORDER — FUROSEMIDE 40 MG PO TABS
40.0000 mg | ORAL_TABLET | Freq: Every day | ORAL | Status: DC
  Administered 2011-01-29: 40 mg via ORAL
  Filled 2011-01-29: qty 1

## 2011-01-29 MED ORDER — CYANOCOBALAMIN 500 MCG PO TABS
500.0000 ug | ORAL_TABLET | Freq: Every day | ORAL | Status: DC
  Administered 2011-01-29: 500 ug via ORAL
  Filled 2011-01-29: qty 1

## 2011-01-29 MED ORDER — DIPHENHYDRAMINE HCL 25 MG PO CAPS: 25.0000 mg | ORAL_CAPSULE | ORAL | Status: DC | PRN

## 2011-01-29 MED ORDER — ZOLPIDEM TARTRATE 5 MG PO TABS
5.0000 mg | ORAL_TABLET | Freq: Every evening | ORAL | Status: DC | PRN
  Administered 2011-01-29: 5 mg via ORAL
  Filled 2011-01-29: qty 1

## 2011-01-29 MED ORDER — HYDROCODONE-ACETAMINOPHEN 5-325 MG PO TABS: 1.0000 | ORAL_TABLET | Freq: Four times a day (QID) | ORAL | Status: DC | PRN

## 2011-01-29 MED ORDER — LORAZEPAM 1 MG PO TABS
ORAL_TABLET | ORAL | Status: AC
  Filled 2011-01-29: qty 1

## 2011-01-29 MED ORDER — ACETAMINOPHEN 325 MG PO TABS
650.0000 mg | ORAL_TABLET | ORAL | Status: DC | PRN
  Administered 2011-01-29: 650 mg via ORAL
  Filled 2011-01-29: qty 2

## 2011-01-29 MED ORDER — ONDANSETRON HCL 4 MG PO TABS: 4.0000 mg | ORAL_TABLET | Freq: Three times a day (TID) | ORAL | Status: DC | PRN

## 2011-01-29 MED ORDER — FOLIC ACID 1 MG PO TABS
1.0000 mg | ORAL_TABLET | Freq: Every day | ORAL | Status: DC
  Administered 2011-01-29: 1 mg via ORAL
  Filled 2011-01-29: qty 1

## 2011-01-29 MED ORDER — MEMANTINE HCL 10 MG PO TABS
10.0000 mg | ORAL_TABLET | Freq: Every day | ORAL | Status: DC
  Administered 2011-01-29: 10 mg via ORAL
  Filled 2011-01-29: qty 1

## 2011-01-29 MED ORDER — LORAZEPAM 1 MG PO TABS
1.0000 mg | ORAL_TABLET | Freq: Three times a day (TID) | ORAL | Status: DC | PRN
  Administered 2011-01-29 (×3): 1 mg via ORAL
  Filled 2011-01-29: qty 1

## 2011-01-29 MED ORDER — METOPROLOL TARTRATE 25 MG PO TABS
12.5000 mg | ORAL_TABLET | Freq: Two times a day (BID) | ORAL | Status: DC
  Filled 2011-01-29: qty 1

## 2011-01-29 MED ORDER — PREDNISONE 5 MG PO TABS
5.0000 mg | ORAL_TABLET | ORAL | Status: DC
  Filled 2011-01-29 (×2): qty 1

## 2011-01-29 MED ORDER — ALUM & MAG HYDROXIDE-SIMETH 200-200-20 MG/5ML PO SUSP: 30.0000 mL | ORAL | Status: DC | PRN

## 2011-01-29 MED ORDER — VITAMIN C 500 MG PO TABS
500.0000 mg | ORAL_TABLET | Freq: Every day | ORAL | Status: DC
  Administered 2011-01-29: 500 mg via ORAL
  Filled 2011-01-29: qty 1

## 2011-01-29 MED ORDER — DILTIAZEM HCL ER COATED BEADS 180 MG PO CP24
180.0000 mg | ORAL_CAPSULE | Freq: Every day | ORAL | Status: DC
  Administered 2011-01-29: 180 mg via ORAL
  Filled 2011-01-29: qty 1

## 2011-01-29 MED ORDER — DIGOXIN 250 MCG PO TABS
0.2500 mg | ORAL_TABLET | Freq: Every day | ORAL | Status: DC
  Administered 2011-01-29: 0.25 mg via ORAL
  Filled 2011-01-29: qty 1

## 2011-01-29 MED ORDER — LEFLUNOMIDE 20 MG PO TABS
10.0000 mg | ORAL_TABLET | Freq: Every day | ORAL | Status: DC
  Administered 2011-01-29: 10 mg via ORAL

## 2011-01-29 NOTE — ED Notes (Signed)
Dr Preston Fleeting notified that the patient is requesting meds to help him sleep. Md sts that the patient has a bed at [facility]. I called [facility] and was told that the patients girlfriend [name] was a patient over there and they could not accept him.  This patient will have to go somewhere else to be admitted other than [facility].  Dr Preston Fleeting notified of this

## 2011-01-29 NOTE — ED Notes (Signed)
This nurse difficulty charting Ativan. 1 mg of Ativan given at 1200.

## 2011-01-29 NOTE — ED Notes (Signed)
Report given to Lanora Manis, Charity fundraiser at [facility]. All questions and concerns addressed. The Pharmacist at [facility] was also called and spoken with about the pt's home medications which we will send to the facility upon transfer.

## 2011-01-29 NOTE — ED Notes (Signed)
Pt increasing in anxiety about going to [facility]. He wanted to go to [facility] instead.

## 2011-01-29 NOTE — ED Notes (Signed)
Patient continues to rest.

## 2011-01-29 NOTE — ED Provider Notes (Signed)
8:16 AM The patient was seen and evaluated by me this morning and is in no apparent distress, is awake and alert, and is aware of the current treatment plan.  The patient makes no requests for any interventions or treatments otherwise at this time.   Felisa Bonier, MD 01/29/11 [phone]

## 2011-02-16 ENCOUNTER — Encounter: Payer: Self-pay | Admitting: Internal Medicine

## 2011-02-16 ENCOUNTER — Ambulatory Visit (INDEPENDENT_AMBULATORY_CARE_PROVIDER_SITE_OTHER): Payer: Medicare Other | Admitting: Internal Medicine

## 2011-02-16 DIAGNOSIS — F329 Major depressive disorder, single episode, unspecified: Secondary | ICD-10-CM

## 2011-02-16 DIAGNOSIS — M069 Rheumatoid arthritis, unspecified: Secondary | ICD-10-CM

## 2011-02-16 DIAGNOSIS — H8110 Benign paroxysmal vertigo, unspecified ear: Secondary | ICD-10-CM

## 2011-02-16 NOTE — Assessment & Plan Note (Signed)
On Arava from rheum since 2011 for same - follows with Dareen Piano Accidentally taken off list following [facility] - resume same now and follow up rheum as planned Continue hydrocodone prn (supplied by rheum as needed, no refills from me today)

## 2011-02-16 NOTE — Progress Notes (Signed)
Subjective:    Patient ID: [name], male    DOB: 01/10/1932, 78 y.o.   MRN: [id number]  HPI  Here for follow up - depression/anxiety - hosp at [facility] 01/2011 for same (also 08/2010) Med changes reviewed - feels less anxious with ativan as rx'd Follows with [facility] q2-4 weeks for counseling Also reviewed chronic medical issues:  pulm fibrosis - idopathic and ?amio component - follows with pulm for same - denies shortness of breath or dyspnea on exertion at this time - reports compliance with meds - no O2 needs   A fib/flutter - hosp with same and exac CHF 10/2010 - s/p cardioversion and ablation 03/2009 = sinus maintained, off amio due to above - denies palpitations or edema changes - compliants with meds as rx'd and follows with dr. [place] - initially on pradaxa but no anticoag rec at this time   RA - follows with rheum for same - on arava since start of 2011, denies adv effects from same, pain and mobility much improved - uses hydrocodone as needed (from rheum)  OSA - intermittent CPAP use - believes weight loss which has occured since 03/2009 hosp has helped his OSA symptoms so CPAP "not necessary most of the time"   chronic neuropathic pain - burning and numbness affects hands and feet, no recent change in symptoms - controlled with gabapentin - needs refill   Mild hyperglycemia 2011 on labs at dr. [place] (cards) - random 130s -  hx "borderline sugar" in past  no recent steroids for arthritis (RA) or pulm flares  Continued overindulgence in sweets at night and snacks   Past Medical History  Diagnosis Date  . Atrial flutter     s/p ablation for RVR  03/2009, pradaxa ongoing  . VENOUS INSUFFICIENCY, LEFT LEG   . PULMONARY FIBROSIS, INTERSTITIAL   . OBSTRUCTIVE SLEEP APNEA   . BENIGN POSITIONAL VERTIGO   . HYPERGLYCEMIA 2011  . Interstitial lung disease   . ALLERGIC RHINITIS   . Rheumatoid arthritis   . Chronic CHF   . PERIPHERAL NEUROPATHY   . HYPERTENSION   . HYPERLIPIDEMIA   .  DEPRESSION     [facility] hosp 01/2011 for SI  . Anxiety     Review of Systems  Constitutional: Negative for fever and fatigue.  HENT: Negative for hearing loss, ear pain and ear discharge.   Respiratory: Negative for cough and shortness of breath.   Cardiovascular: Negative for chest pain and palpitations.       Objective:   Physical Exam  BP 138/82  Pulse 83  Temp(Src) 98 F (36.7 C) (Oral)  Wt 211 lb (95.709 kg)  SpO2 97% Constitutional:  He appears well-developed and well-nourished. No distress.  Neck: Normal range of motion. Neck supple. No JVD present. No thyromegaly present.  Cardiovascular: Normal rate, regular rhythm and normal heart sounds.  No murmur heard. no BLE edema Pulmonary/Chest: Effort normal and breath sounds normal. No respiratory distress. no wheezes.  Neuro: AAxO3 - CN 2-12 symmetrically intact - MAE, normal F>N and negative Romberg; gait normal without asst Psychiatric: he has an anxious and dysphoric mood and affect. Perseverating. behavior is normal. Judgment and thought content normal.   Lab Results  Component Value Date   WBC 10.4 01/28/2011   HGB 15.6 01/28/2011   HCT 46.0 01/28/2011   PLT 327 01/28/2011   GLUCOSE 118* 01/28/2011   ALT 30 10/01/2010   AST 18 10/01/2010   NA 137 01/28/2011   K 4.3  01/28/2011   CL 100 01/28/2011   CREATININE 0.90 01/28/2011   BUN 11 01/28/2011   CO2 25 10/01/2010   TSH 1.532 09/22/2010   PSA 5.04* 09/26/2010   INR 1.14 09/19/2010   HGBA1C 6.3 06/30/2010       Assessment & Plan:   See problem list. Medications and labs reviewed today.  Time spent with pt today 25 minutes, greater than 50% time spent counseling patient on depression, BPPV, RA and medication review. Also review of available [facility] records

## 2011-02-16 NOTE — Assessment & Plan Note (Signed)
Behavioral health hospitalization August 2012 and Dec 2012 reviewed  Today verified nonsuicidal, Continue all other medications as before

## 2011-02-16 NOTE — Assessment & Plan Note (Signed)
Recurrent and chronic symptoms  02/2009, 04/2010 & 12/2010 episodes resolved with meclizine - use same prn - refill done MRI brain 09/2010: Largely unremarkable for age, no mass If unimproved symptoms with meds, consider vestibular rehab refer

## 2011-02-16 NOTE — Patient Instructions (Signed)
It was good to see you today. We have reviewed your hospitalization and medications from Dr. Renee [facility]. continue meclizine pills 3 times a day as needed for dizziness symptoms - Your prescription(srefills ) have been submitted to your pharmacy. Please take as directed and contact our office if you believe you are having problem(s) with the medication(s). If continued balance problems or dizziness despite this medication, call for referral to vestibular rehabilitation as we discussed Other Medications reviewed, no changes at this time. Please schedule followup in 3-4 months, call sooner if problems.

## 2011-02-25 ENCOUNTER — Other Ambulatory Visit: Payer: Self-pay | Admitting: Physician Assistant

## 2011-03-10 ENCOUNTER — Other Ambulatory Visit: Payer: Self-pay | Admitting: Cardiology

## 2011-03-10 MED ORDER — FUROSEMIDE 40 MG PO TABS: 40.0000 mg | ORAL_TABLET | Freq: Every day | ORAL | Status: DC

## 2011-03-11 ENCOUNTER — Other Ambulatory Visit: Payer: Self-pay | Admitting: *Deleted

## 2011-03-11 MED ORDER — DOCUSATE SODIUM 100 MG PO TABS: 100.0000 mg | ORAL_TABLET | Freq: Two times a day (BID) | ORAL | Status: DC

## 2011-04-08 ENCOUNTER — Telehealth: Payer: Self-pay

## 2011-04-08 MED ORDER — DABIGATRAN ETEXILATE MESYLATE 150 MG PO CAPS: 150.0000 mg | ORAL_CAPSULE | Freq: Two times a day (BID) | ORAL | Status: DC

## 2011-04-08 NOTE — Telephone Encounter (Signed)
Spoke to [employer] rx prior Berkley Harvey was approved for pradaxa.Ref # [id number]

## 2011-04-15 ENCOUNTER — Other Ambulatory Visit: Payer: Self-pay | Admitting: Physician Assistant

## 2011-04-15 ENCOUNTER — Telehealth: Payer: Self-pay | Admitting: Internal Medicine

## 2011-04-15 MED ORDER — MECLIZINE HCL 25 MG PO TABS: 25.0000 mg | ORAL_TABLET | Freq: Three times a day (TID) | ORAL | Status: DC | PRN

## 2011-04-15 NOTE — Telephone Encounter (Signed)
The pt called and stated he was having dizzyness.  He would prefer a medicine he previously took for the same symptom to be called into the pharmacy.  i advised him he might need an ov if this rx can not be refilled.  Let me know and i'll be happy to call him back. Thanks!

## 2011-04-15 NOTE — Telephone Encounter (Signed)
Pt return call back gave him md response.... 04/15/11@3 :50pm/LMB

## 2011-04-15 NOTE — Telephone Encounter (Signed)
Meclizine erx done

## 2011-04-15 NOTE — Telephone Encounter (Signed)
Notified pt with md response. Pt states he is already taking meclizine and med is not helping dizziness. Pt states md gave him something else for dizziness, not sure of name of med. Pt is wanting something else... 04/15/11@1 :31pm/LMB

## 2011-04-15 NOTE — Telephone Encounter (Signed)
i do not see another rx med - if pt can find name of med from pharmacy or from old med bottle, i will consider rx same (?valium) - no other changes at this time - thx

## 2011-04-20 ENCOUNTER — Ambulatory Visit: Payer: Medicare Other | Admitting: Internal Medicine

## 2011-04-23 ENCOUNTER — Encounter: Payer: Self-pay | Admitting: Internal Medicine

## 2011-04-23 ENCOUNTER — Ambulatory Visit (INDEPENDENT_AMBULATORY_CARE_PROVIDER_SITE_OTHER): Payer: Medicare Other | Admitting: Internal Medicine

## 2011-04-23 VITALS — BP 142/82 | HR 72 | Temp 98.5°F | Resp 16 | Wt 207.0 lb

## 2011-04-23 DIAGNOSIS — H8110 Benign paroxysmal vertigo, unspecified ear: Secondary | ICD-10-CM

## 2011-04-23 DIAGNOSIS — F411 Generalized anxiety disorder: Secondary | ICD-10-CM

## 2011-04-23 DIAGNOSIS — F419 Anxiety disorder, unspecified: Secondary | ICD-10-CM

## 2011-04-23 MED ORDER — DIAZEPAM 2 MG PO TABS: 2.0000 mg | ORAL_TABLET | Freq: Three times a day (TID) | ORAL | Status: DC | PRN

## 2011-04-23 NOTE — Assessment & Plan Note (Signed)
Recurrent and chronic symptoms  02/2009, 04/2010, 12/2010 & 01/2011 episodes resolved with meclizine - uses same prn -  MRI brain 09/2010: Largely unremarkable for age, no mass Will refer to ENT for audiology testing, vestibular rehab and try Valium in place of meclizine (may also help anxiety/panic symptoms - see next)

## 2011-04-23 NOTE — Progress Notes (Signed)
Subjective:    Patient ID: [name], male    DOB: 26-Dec-1931, 78 y.o.   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 [place] - initially on pradaxa but no anticoag rec at this time   RA - follows with rheum for same - on arava since start of 2011, denies adv effects from same, pain and mobility much improved - uses hydrocodone as needed (from rheum)  OSA - intermittent CPAP use - believes weight loss which has occured since 03/2009 hosp has helped his OSA symptoms so CPAP "not necessary most of the time"   chronic neuropathic pain - burning and numbness affects hands and feet, no recent change in symptoms - controlled with gabapentin - needs refill   Mild hyperglycemia 2011 on labs at dr. [place] (cards) - random 130s -  hx "borderline sugar" in past  no recent steroids for arthritis (RA) or pulm flares  Continued overindulgence in sweets at night and snacks   Past Medical History  Diagnosis Date  . Atrial flutter     s/p ablation for RVR  03/2009, pradaxa ongoing  . VENOUS INSUFFICIENCY, LEFT LEG   . PULMONARY FIBROSIS, INTERSTITIAL   . OBSTRUCTIVE SLEEP APNEA   . BENIGN POSITIONAL VERTIGO   .  HYPERGLYCEMIA 2011  . Interstitial lung disease   . ALLERGIC RHINITIS   . Rheumatoid arthritis   . Chronic CHF   . PERIPHERAL NEUROPATHY   . HYPERTENSION   . HYPERLIPIDEMIA   . DEPRESSION     [facility] hosp 01/2011 for SI  . Anxiety   . Morbid obesity     Review of Systems  Constitutional: Negative for fever and fatigue.  HENT: Negative for hearing loss, ear pain and ear discharge.   Respiratory: Negative for cough and shortness of breath.   Cardiovascular: Negative for chest pain and palpitations.  Neurological: Negative for facial asymmetry, speech difficulty, light-headedness, numbness and headaches.       Objective:   Physical Exam  BP 142/82  Pulse 72  Temp(Src) 98.5 F (36.9 C) (Oral)  Resp 16  Wt 207 lb (93.895 kg)  SpO2 97% Constitutional:  He appears well-developed and well-nourished. No distress. Resting lip smacking. HENT: NCAT, sinus non tender, TMs hazy but no redness or effusion - no cerumen - OP clear Neck: Normal range of motion. Neck supple. No JVD present. No thyromegaly present.  Cardiovascular: Normal rate, regular rhythm and normal heart sounds.  No murmur heard. no BLE edema Pulmonary/Chest: Effort normal and breath sounds normal. No respiratory distress. no wheezes.  Neuro: AAxO3 - CN 2-12 symmetrically intact - MAE, normal F>N and negative Romberg; gait normal without asst Psychiatric: he has  an anxious and dysphoric mood and affect. Perseverating. behavior is normal. Judgment and thought content normal.   Lab Results  Component Value Date   WBC 10.4 01/28/2011   HGB 15.6 01/28/2011   HCT 46.0 01/28/2011   PLT 327 01/28/2011   GLUCOSE 118* 01/28/2011   ALT 30 10/01/2010   AST 18 10/01/2010   NA 137 01/28/2011   K 4.3 01/28/2011   CL 100 01/28/2011   CREATININE 0.90 01/28/2011   BUN 11 01/28/2011   CO2 25 10/01/2010   TSH 1.532 09/22/2010   PSA 5.04* 09/26/2010   INR 1.14 09/19/2010   HGBA1C 6.3 06/30/2010       Assessment & Plan:   See problem list. Medications and  labs reviewed today.

## 2011-04-23 NOTE — Patient Instructions (Signed)
It was good to see you today. Use Valium in place of meclizine 3 times a day as needed for severe dizziness symptoms -Your prescription(s) have been submitted to your pharmacy. Please take as directed and contact our office if you believe you are having problem(s) with the medication(s). Will also refer to ENT specialist and vestibular rehabilitation  - Our office will contact you regarding appointment(s) once made. Other Medications reviewed, no changes at this time. Please keep scheduled followup, call sooner if problems.

## 2011-04-23 NOTE — Assessment & Plan Note (Signed)
Behavioral health hospitalization August 2012 and Dec 2012 reviewed  Increasing symptoms - pending med review with [facility] today Hx Valium dependence - but on ativan at this time (ongoing scheduled since 12/12 hosp) Today verified nonsuicidal, Continue all other medications as before

## 2011-05-04 ENCOUNTER — Other Ambulatory Visit: Payer: Self-pay | Admitting: *Deleted

## 2011-05-04 DIAGNOSIS — H8110 Benign paroxysmal vertigo, unspecified ear: Secondary | ICD-10-CM

## 2011-05-04 MED ORDER — DIAZEPAM 2 MG PO TABS: 2.0000 mg | ORAL_TABLET | Freq: Three times a day (TID) | ORAL | Status: DC | PRN

## 2011-05-05 NOTE — Telephone Encounter (Signed)
[facility]... 05/05/11@8 :52am/LMB

## 2011-05-06 ENCOUNTER — Telehealth: Payer: Self-pay | Admitting: *Deleted

## 2011-05-06 NOTE — Telephone Encounter (Signed)
Received fax needing PA on diazepam 2 mg. Called insurance will be faxing over PA form. Have received place on md for completion... 05/06/11@3 :58pm/LMB

## 2011-05-07 NOTE — Telephone Encounter (Signed)
Faxed back PA await on approval status... 05/07/11@1 :41pm/LMB

## 2011-05-07 NOTE — Telephone Encounter (Signed)
Received Pa back med has been approved. Notified pharmacy spoke with Samara Deist gave status... 05/07/11@3 :36pm/LMB

## 2011-05-07 NOTE — Telephone Encounter (Signed)
done

## 2011-05-18 ENCOUNTER — Encounter: Payer: Self-pay | Admitting: Internal Medicine

## 2011-05-18 ENCOUNTER — Other Ambulatory Visit (INDEPENDENT_AMBULATORY_CARE_PROVIDER_SITE_OTHER): Payer: Medicare Other

## 2011-05-18 ENCOUNTER — Ambulatory Visit (INDEPENDENT_AMBULATORY_CARE_PROVIDER_SITE_OTHER): Payer: Medicare Other | Admitting: Internal Medicine

## 2011-05-18 VITALS — BP 100/52 | HR 81 | Temp 98.1°F | Ht 68.0 in | Wt 206.6 lb

## 2011-05-18 DIAGNOSIS — R7309 Other abnormal glucose: Secondary | ICD-10-CM

## 2011-05-18 DIAGNOSIS — I482 Chronic atrial fibrillation, unspecified: Secondary | ICD-10-CM

## 2011-05-18 DIAGNOSIS — F419 Anxiety disorder, unspecified: Secondary | ICD-10-CM

## 2011-05-18 DIAGNOSIS — I4891 Unspecified atrial fibrillation: Secondary | ICD-10-CM

## 2011-05-18 DIAGNOSIS — I10 Essential (primary) hypertension: Secondary | ICD-10-CM

## 2011-05-18 DIAGNOSIS — M069 Rheumatoid arthritis, unspecified: Secondary | ICD-10-CM

## 2011-05-18 DIAGNOSIS — F411 Generalized anxiety disorder: Secondary | ICD-10-CM

## 2011-05-18 LAB — HEMOGLOBIN A1C: Hgb A1c MFr Bld: 5.9 % (ref 4.6–6.5)

## 2011-05-18 NOTE — Patient Instructions (Addendum)
It was good to see you today. Test(s) ordered today. Your results will be called to you after review (48-72hours after test completion). If any changes need to be made, you will be notified at that time. continue meclizine pills 3 times a day as needed for dizziness symptoms - Your prescription(s) refills have been submitted to your pharmacy. Please take as directed and contact our office if you believe you are having problem(s) with the medication(s). Other Medications reviewed, no changes at this time. Please schedule followup in 4 months, call sooner if problems.

## 2011-05-18 NOTE — Progress Notes (Signed)
Subjective:    Patient ID: [name], male    DOB: 02-Nov-1931, 78 y.o.   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 [place] - resumed pradaxa 03/2011   RA - follows with rheum for same - on arava since start of 2011, denies side effects from same, pain and mobility much improved - uses hydrocodone as needed (from rheum)  OSA - intermittent CPAP use - believes weight loss which has occured since 03/2009 hosp has helped his OSA symptoms so CPAP "not necessary most of the time"   chronic neuropathic pain - burning and numbness affects hands and feet, no recent change in symptoms - controlled with gabapentin -  Mild hyperglycemia 2011 on labs at dr. [place] (cards) - random 130s -  hx "borderline sugar" in past  chronic low dose steroids for arthritis (RA)  Continued overindulgence in sweets at night and snacks   Past Medical History  Diagnosis Date  . Atrial flutter     s/p ablation for RVR  03/2009, pradaxa ongoing  . VENOUS INSUFFICIENCY, LEFT LEG   . PULMONARY FIBROSIS, INTERSTITIAL   . OBSTRUCTIVE SLEEP APNEA   . BENIGN POSITIONAL VERTIGO   . HYPERGLYCEMIA 2011  . Interstitial lung disease   . ALLERGIC RHINITIS   . Rheumatoid arthritis   . Chronic CHF   . PERIPHERAL NEUROPATHY   .  HYPERTENSION   . HYPERLIPIDEMIA   . DEPRESSION     [facility] hosp 01/2011 for SI  . Anxiety   . Morbid obesity     Review of Systems  Constitutional: Negative for fever and fatigue.  HENT: Negative for hearing loss, ear pain and ear discharge.   Respiratory: Negative for cough and shortness of breath.   Cardiovascular: Negative for chest pain and palpitations.  Neurological: Negative for facial asymmetry, speech difficulty, light-headedness, numbness and headaches.       Objective:   Physical Exam  BP 100/52  Pulse 81  Temp(Src) 98.1 F (36.7 C) (Oral)  Ht 5\' 8"  (1.727 m)  Wt 206 lb 9.6 oz (93.713 kg)  BMI 31.41 kg/m2  SpO2 98% Wt Readings from Last 3 Encounters:  05/18/11 206 lb 9.6 oz (93.713 kg)  04/23/11 207 lb (93.895 kg)  02/16/11 211 lb (95.709 kg)   Constitutional:  He appears well-developed and well-nourished. No distress. Resting lip smacking. HENT: NCAT, sinuses non tender, TMs hazy but no redness or effusion - no cerumen - OP clear Neck: Normal range of motion. Neck supple. No JVD present. No thyromegaly present.  Cardiovascular: Normal rate, regular rhythm and normal heart sounds.  No murmur heard. no BLE edema Pulmonary/Chest: Effort normal and breath sounds normal. No respiratory distress. no wheezes.  Neuro: AAxO3 - CN 2-12 symmetrically intact - MAE, normal F>N  and negative Romberg; gait normal without asst Psychiatric: he has an anxious and dysphoric mood and affect. Perseverating. behavior is normal. Judgment and thought content normal.   Lab Results  Component Value Date   WBC 10.4 01/28/2011   HGB 15.6 01/28/2011   HCT 46.0 01/28/2011   PLT 327 01/28/2011   GLUCOSE 118* 01/28/2011   ALT 30 10/01/2010   AST 18 10/01/2010   NA 137 01/28/2011   K 4.3 01/28/2011   CL 100 01/28/2011   CREATININE 0.90 01/28/2011   BUN 11 01/28/2011   CO2 25 10/01/2010   TSH 1.532 09/22/2010   PSA 5.04* 09/26/2010   INR 1.14 09/19/2010   HGBA1C 6.3 06/30/2010       Assessment & Plan:   See  problem list. Medications and labs reviewed today.

## 2011-05-18 NOTE — Assessment & Plan Note (Signed)
Behavioral health hospitalization August 2012, Dec 2012, and Jan 2013 reviewed  Increasing symptoms - ongoing counseling and med mgmt [facility] Hx Valium dependence - but using same due to BPPV flare 04/2011 Also on ativan at this time (ongoing scheduled since 12/12 hosp) Today verified nonsuicidal, Continue all other medications as before

## 2011-05-18 NOTE — Assessment & Plan Note (Signed)
BP Readings from Last 3 Encounters:  05/18/11 100/52  04/23/11 142/82  02/16/11 138/82  low normal today but asymptomatic  The current medical regimen is effective;  continue present plan and medications.

## 2011-05-18 NOTE — Assessment & Plan Note (Signed)
On Arava from rheum since 2011 for same - follows with Dareen Piano Also prednisone Continue hydrocodone prn (supplied by rheum as needed, no refills from me today)

## 2011-05-18 NOTE — Assessment & Plan Note (Signed)
Random hyperglycemia on labs with cards fall 2011 -  Also on low dose chronic pred for RA check a1c q6-82mo due to FH DM but pt doing well with weight control to manage same Lab Results  Component Value Date   HGBA1C 6.3 06/30/2010

## 2011-05-18 NOTE — Assessment & Plan Note (Signed)
anticoag with Pradaxa off beta-blocker due to bradycardia 01/2011 Off amio due to pulm fibrosis Continue Dilt and Dig and follows with cards as needed

## 2011-05-22 ENCOUNTER — Other Ambulatory Visit: Payer: Self-pay | Admitting: Internal Medicine

## 2011-05-22 ENCOUNTER — Other Ambulatory Visit: Payer: Self-pay | Admitting: Cardiology

## 2011-05-22 NOTE — Telephone Encounter (Signed)
Refilled digoxin 

## 2011-05-25 ENCOUNTER — Other Ambulatory Visit: Payer: Self-pay | Admitting: *Deleted

## 2011-05-25 DIAGNOSIS — H8110 Benign paroxysmal vertigo, unspecified ear: Secondary | ICD-10-CM

## 2011-05-25 MED ORDER — DIAZEPAM 2 MG PO TABS: 2.0000 mg | ORAL_TABLET | Freq: Three times a day (TID) | ORAL | Status: DC | PRN

## 2011-05-25 NOTE — Telephone Encounter (Signed)
[facility]... 05/25/11@12 :35pm/LMB

## 2011-05-25 NOTE — Telephone Encounter (Signed)
yes

## 2011-05-25 NOTE — Telephone Encounter (Signed)
Received fax from pharmacy pt requesting renewal on diazepam 2 mg take 1  y mouth every 8 hours as needed for anxiety or dizziness. Med is not on med list. Is this ok to refill?... 05/25/11@11 :33am/LMB

## 2011-06-19 ENCOUNTER — Encounter: Payer: Self-pay | Admitting: *Deleted

## 2011-06-24 ENCOUNTER — Encounter: Payer: Self-pay | Admitting: Cardiology

## 2011-07-01 ENCOUNTER — Other Ambulatory Visit: Payer: Self-pay | Admitting: *Deleted

## 2011-07-01 MED ORDER — DIAZEPAM 2 MG PO TABS: 2.0000 mg | ORAL_TABLET | Freq: Three times a day (TID) | ORAL | Status: DC | PRN

## 2011-07-02 NOTE — Telephone Encounter (Signed)
Rx faxed to pharmacy  

## 2011-07-07 ENCOUNTER — Encounter (HOSPITAL_COMMUNITY)
Admission: RE | Admit: 2011-07-07 | Discharge: 2011-07-07 | Disposition: A | Discharge status: Home / Self Care | Payer: Medicare Other | Source: Ambulatory Visit | Attending: Rheumatology | Admitting: Rheumatology

## 2011-07-07 DIAGNOSIS — M069 Rheumatoid arthritis, unspecified: Secondary | ICD-10-CM | POA: Insufficient documentation

## 2011-07-07 MED ORDER — METHYLPREDNISOLONE SODIUM SUCC 125 MG IJ SOLR
100.0000 mg | Freq: Once | INTRAMUSCULAR | Status: AC
  Administered 2011-07-07: 100 mg via INTRAVENOUS
  Filled 2011-07-07: qty 2

## 2011-07-07 MED ORDER — SODIUM CHLORIDE 0.9 % IV SOLN
1000.0000 mg | Freq: Once | INTRAVENOUS | Status: AC
  Administered 2011-07-07: 1000 mg via INTRAVENOUS
  Filled 2011-07-07: qty 100

## 2011-07-20 ENCOUNTER — Other Ambulatory Visit: Payer: Self-pay | Admitting: *Deleted

## 2011-07-20 MED ORDER — DIAZEPAM 2 MG PO TABS: 2.0000 mg | ORAL_TABLET | Freq: Three times a day (TID) | ORAL | Status: AC | PRN

## 2011-07-20 NOTE — Telephone Encounter (Signed)
[facility]... 07/20/11@12 :05pm/LMB

## 2011-07-22 ENCOUNTER — Encounter (HOSPITAL_COMMUNITY)
Admission: RE | Admit: 2011-07-22 | Discharge: 2011-07-22 | Disposition: A | Discharge status: Home / Self Care | Payer: Medicare Other | Source: Ambulatory Visit | Attending: Rheumatology | Admitting: Rheumatology

## 2011-07-22 DIAGNOSIS — I872 Venous insufficiency (chronic) (peripheral): Secondary | ICD-10-CM | POA: Insufficient documentation

## 2011-07-22 DIAGNOSIS — I10 Essential (primary) hypertension: Secondary | ICD-10-CM | POA: Insufficient documentation

## 2011-07-22 DIAGNOSIS — I4891 Unspecified atrial fibrillation: Secondary | ICD-10-CM | POA: Insufficient documentation

## 2011-07-22 DIAGNOSIS — E785 Hyperlipidemia, unspecified: Secondary | ICD-10-CM | POA: Insufficient documentation

## 2011-07-22 DIAGNOSIS — H8110 Benign paroxysmal vertigo, unspecified ear: Secondary | ICD-10-CM | POA: Insufficient documentation

## 2011-07-22 DIAGNOSIS — M069 Rheumatoid arthritis, unspecified: Secondary | ICD-10-CM | POA: Insufficient documentation

## 2011-07-22 DIAGNOSIS — F3289 Other specified depressive episodes: Secondary | ICD-10-CM | POA: Insufficient documentation

## 2011-07-22 DIAGNOSIS — I5022 Chronic systolic (congestive) heart failure: Secondary | ICD-10-CM | POA: Insufficient documentation

## 2011-07-22 DIAGNOSIS — F329 Major depressive disorder, single episode, unspecified: Secondary | ICD-10-CM | POA: Insufficient documentation

## 2011-07-22 DIAGNOSIS — F411 Generalized anxiety disorder: Secondary | ICD-10-CM | POA: Insufficient documentation

## 2011-07-22 DIAGNOSIS — J8410 Pulmonary fibrosis, unspecified: Secondary | ICD-10-CM | POA: Insufficient documentation

## 2011-07-22 DIAGNOSIS — G609 Hereditary and idiopathic neuropathy, unspecified: Secondary | ICD-10-CM | POA: Insufficient documentation

## 2011-07-22 MED ORDER — SODIUM CHLORIDE 0.9 % IV SOLN
1000.0000 mg | Freq: Once | INTRAVENOUS | Status: AC
  Administered 2011-07-22: 1000 mg via INTRAVENOUS
  Filled 2011-07-22: qty 100

## 2011-07-22 MED ORDER — SODIUM CHLORIDE 0.9 % IV SOLN
INTRAVENOUS | Status: DC
  Administered 2011-07-22: 250 mL via INTRAVENOUS

## 2011-07-22 MED ORDER — METHYLPREDNISOLONE SODIUM SUCC 125 MG IJ SOLR
100.0000 mg | Freq: Once | INTRAMUSCULAR | Status: AC
  Administered 2011-07-22: 100 mg via INTRAVENOUS
  Filled 2011-07-22: qty 2

## 2011-07-24 ENCOUNTER — Telehealth: Payer: Self-pay | Admitting: *Deleted

## 2011-07-24 NOTE — Telephone Encounter (Signed)
Patient called concern of medications he has prescribed for dizziness. States he has had no dizziness in a couple of weeks and does not want to take if he does not need to along with 15 other medications. Explained to patient the medications were ordered as needed for dizziness and can take if is experiencing dizziness. Patient states understands now and sees that on his medication bottle now.

## 2011-08-04 ENCOUNTER — Other Ambulatory Visit: Payer: Self-pay

## 2011-08-04 NOTE — Telephone Encounter (Signed)
Pt called stating that he is no longer following with psychology and therefore they will not continue to fill his medication. Pt is requesting refill of Namenda. Per EPIC pt should be taking 10 mg BID but he says that he has been taking QD only. Please advise on refill, I recommended an appointment to discuss with VAL but pt declined. Pt aware MD out of office until 07/17

## 2011-08-05 MED ORDER — MEMANTINE HCL 10 MG PO TABS: 10.0000 mg | ORAL_TABLET | Freq: Every day | ORAL | Status: DC

## 2011-08-05 NOTE — Telephone Encounter (Signed)
Ok to fill - Namenda 10mg  qd x 6 months

## 2011-08-05 NOTE — Telephone Encounter (Signed)
Pt advised of Rx/pharmacy via VM 

## 2011-08-22 ENCOUNTER — Other Ambulatory Visit: Payer: Self-pay | Admitting: Internal Medicine

## 2011-09-06 ENCOUNTER — Other Ambulatory Visit: Payer: Self-pay | Admitting: Internal Medicine

## 2011-09-08 ENCOUNTER — Ambulatory Visit (INDEPENDENT_AMBULATORY_CARE_PROVIDER_SITE_OTHER): Payer: Medicare Other | Admitting: Cardiology

## 2011-09-08 ENCOUNTER — Encounter: Payer: Self-pay | Admitting: Cardiology

## 2011-09-08 VITALS — BP 144/86 | HR 105 | Ht 68.0 in | Wt 220.4 lb

## 2011-09-08 DIAGNOSIS — J209 Acute bronchitis, unspecified: Secondary | ICD-10-CM

## 2011-09-08 DIAGNOSIS — I509 Heart failure, unspecified: Secondary | ICD-10-CM

## 2011-09-08 DIAGNOSIS — I4891 Unspecified atrial fibrillation: Secondary | ICD-10-CM

## 2011-09-08 MED ORDER — AZITHROMYCIN 250 MG PO TABS: ORAL_TABLET | ORAL | Status: AC

## 2011-09-08 NOTE — Patient Instructions (Signed)
Continue your current cardiac medications  I will give you an antibiotic Zithromax- take as directed for bronchitis.  I will see you again in 6 months.

## 2011-09-08 NOTE — Progress Notes (Signed)
[name] Date of Birth: 03-21-1931 Medical Record #[id number]  History of Present Illness: [name] is seen today for 6 month followup of atrial fibrillation. He has a history of atrial fibrillation and flutter. He has had a prior flutter ablation. He has a history of congestive heart failure related to tachycardia mediated cardiomyopathy. His last ejection fraction in September of 2011 was 60-65%. He reports that he is doing very well from a cardiac standpoint. His blood pressure has been under control. He denies any palpitations, chest pain, or shortness of breath. He denies any increase in edema. He has had a great deal of difficulty with his nerves this past year but states that he has come out of it in the past month. He is now no "nerve" medication.  Current Outpatient Prescriptions on File Prior to Visit  Medication Sig Dispense Refill  . Ascorbic Acid (VITAMIN C) 500 MG tablet Take 500 mg by mouth daily.        . Cholecalciferol (VITAMIN D3) 1000 UNITS CAPS Take 1 capsule by mouth.        . Coral Calcium 1000 (390 CA) MG TABS Take 2 capsules by mouth.        . dabigatran (PRADAXA) 150 MG CAPS Take 1 capsule (150 mg total) by mouth every 12 (twelve) hours.  60 capsule  6  . digoxin (LANOXIN) 0.25 MG tablet TAKE 1 TABLET EVERY DAY  30 tablet  5  . diltiazem (CARDIZEM CD) 180 MG 24 hr capsule Take 1 capsule (180 mg total) by mouth daily.  30 capsule  0  . Docusate Sodium (STOOL SOFTENER) 100 MG capsule Take 1 tablet (100 mg total) by mouth 2 (two) times daily.  60 tablet  5  . folic acid (FOLVITE) 800 MCG tablet Take 1 tablet (800 mcg total) by mouth daily.  30 tablet  5  . furosemide (LASIX) 40 MG tablet Take 1 tablet (40 mg total) by mouth daily.  90 tablet  3  . gabapentin (NEURONTIN) 300 MG capsule TAKE 2 CAPSULES BY MOUTH AT BEDTIME  60 capsule  5  . HYDROcodone-acetaminophen (VICODIN) 5-500 MG per tablet Take 1 tablet by mouth every 6 (six) hours as needed.      .  leflunomide (ARAVA) 10 MG tablet Take 1 tablet (10 mg total) by mouth daily.      . meclizine (ANTIVERT) 25 MG tablet Take 1 tablet (25 mg total) by mouth 3 (three) times daily as needed for dizziness or nausea.  30 tablet  5  . memantine (NAMENDA) 10 MG tablet Take 1 tablet (10 mg total) by mouth daily.  30 tablet  5  . Multiple Vitamin (MULTIVITAMIN) tablet Take 1 tablet by mouth daily.        Marland Kitchen MYRBETRIQ 50 MG TB24       . NON FORMULARY Urinozinc Prostate formula take2 tablet once a day      . OMEGA-3 1000 MG CAPS Take by mouth daily.      . predniSONE (DELTASONE) 5 MG tablet Take 5 mg by mouth daily.       Marland Kitchen DISCONTD: diltiazem (CARDIZEM CD) 180 MG 24 hr capsule TAKE 1 CAPSULE (180 MG TOTAL) BY MOUTH DAILY.  30 capsule  5    Allergies  Allergen Reactions  . Methotrexate     REACTION: ONLY HAS ONE KIDNEY    Past Medical History  Diagnosis Date  . Atrial flutter     s/p ablation for RVR  03/2009, pradaxa ongoing  . VENOUS INSUFFICIENCY, LEFT LEG   . PULMONARY FIBROSIS, INTERSTITIAL   . OBSTRUCTIVE SLEEP APNEA   . BENIGN POSITIONAL VERTIGO   . HYPERGLYCEMIA 2011  . Interstitial lung disease   . ALLERGIC RHINITIS   . Rheumatoid arthritis   . Chronic CHF   . PERIPHERAL NEUROPATHY   . HYPERTENSION   . HYPERLIPIDEMIA   . DEPRESSION     [facility] hosp 01/2011 for SI  . Anxiety   . Morbid obesity     Past Surgical History  Procedure Date  . 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 Shoulder surgery 09/2009  . Varicose vein surgery     Left Leg  . Radiofrequency ablation     History  Smoking status  . Former Smoker -- 1.0 packs/day for 3 years  . Types: Cigarettes  . Quit date: 01/20/2000  Smokeless tobacco  . Never Used  Comment: Single, lives alone. 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 His family contact would be his sister [name] [phone]    History  Alcohol Use No    Former     Family History  Problem Relation Age of Onset  . Heart attack Father 28  . Other Mother 81    Old age    Review of Systems: The review of systems is positive for chronic depression. He also has chronic pain. He recently has had a deep head and chest cold coughing up yellowish phlegm for the past week.  All other systems were reviewed and are negative.  Physical Exam: BP 144/86  Pulse 105  Ht 5\' 8"  (1.727 m)  Wt 99.973 kg (220 lb 6.4 oz)  BMI 33.51 kg/m2  SpO2 99% He is an elderly white male in no acute distress. His HEENT exam is unremarkable. Has no JVD or bruits. Lungs reveal scattered rhonchi. Cardiac exam reveals an irregular rate and rhythm with a soft systolic murmur at the apex. Abdomen is soft, nontender. He has trace to 1+ left leg edema. Pedal pulses are good. He is alert and oriented x3. Cranial nerves II through XII are intact.  LABORATORY DATA:   Assessment / Plan: 1. Atrial fibrillation. Rate is well controlled on digoxin and diltiazem. He is on chronic anticoagulation with Pradaxa. I will followup again in 6 months.  2. Congestive heart failure. Prior history of tachycardia mediated cardiomyopathy. This had resolved with rate control with last ejection fraction of 60-65% in 2011. We will continue with his current management.  3. Acute bronchitis. Patient given a prescription for a Z-Pak.   [name] MD, [facility]

## 2011-10-13 ENCOUNTER — Other Ambulatory Visit: Payer: Self-pay | Admitting: Cardiology

## 2011-10-30 DIAGNOSIS — I5023 Acute on chronic systolic (congestive) heart failure: Secondary | ICD-10-CM

## 2011-11-04 ENCOUNTER — Encounter: Payer: Self-pay | Admitting: Nurse Practitioner

## 2011-11-04 ENCOUNTER — Ambulatory Visit (HOSPITAL_COMMUNITY): Payer: Medicare Other | Attending: Cardiology | Admitting: Radiology

## 2011-11-04 ENCOUNTER — Ambulatory Visit (INDEPENDENT_AMBULATORY_CARE_PROVIDER_SITE_OTHER): Payer: Medicare Other | Admitting: Nurse Practitioner

## 2011-11-04 VITALS — BP 122/72 | HR 80 | Ht 69.0 in | Wt 228.8 lb

## 2011-11-04 DIAGNOSIS — I420 Dilated cardiomyopathy: Secondary | ICD-10-CM

## 2011-11-04 DIAGNOSIS — I059 Rheumatic mitral valve disease, unspecified: Secondary | ICD-10-CM | POA: Insufficient documentation

## 2011-11-04 DIAGNOSIS — R0609 Other forms of dyspnea: Secondary | ICD-10-CM

## 2011-11-04 DIAGNOSIS — I10 Essential (primary) hypertension: Secondary | ICD-10-CM | POA: Insufficient documentation

## 2011-11-04 DIAGNOSIS — R0600 Dyspnea, unspecified: Secondary | ICD-10-CM

## 2011-11-04 DIAGNOSIS — E785 Hyperlipidemia, unspecified: Secondary | ICD-10-CM

## 2011-11-04 DIAGNOSIS — I379 Nonrheumatic pulmonary valve disorder, unspecified: Secondary | ICD-10-CM | POA: Insufficient documentation

## 2011-11-04 DIAGNOSIS — R5383 Other fatigue: Secondary | ICD-10-CM

## 2011-11-04 DIAGNOSIS — I428 Other cardiomyopathies: Secondary | ICD-10-CM

## 2011-11-04 DIAGNOSIS — I4891 Unspecified atrial fibrillation: Secondary | ICD-10-CM

## 2011-11-04 DIAGNOSIS — I369 Nonrheumatic tricuspid valve disorder, unspecified: Secondary | ICD-10-CM | POA: Insufficient documentation

## 2011-11-04 DIAGNOSIS — I5022 Chronic systolic (congestive) heart failure: Secondary | ICD-10-CM | POA: Insufficient documentation

## 2011-11-04 DIAGNOSIS — R5381 Other malaise: Secondary | ICD-10-CM

## 2011-11-04 LAB — BASIC METABOLIC PANEL
BUN: 19 mg/dL (ref 6–23)
CO2: 29 mEq/L (ref 19–32)
Calcium: 8.8 mg/dL (ref 8.4–10.5)
Chloride: 105 mEq/L (ref 96–112)
Creatinine, Ser: 0.9 mg/dL (ref 0.4–1.5)
GFR: 85.64 mL/min (ref >60.00)
Glucose, Bld: 89 mg/dL (ref 70–99)
Potassium: 4.5 mEq/L (ref 3.5–5.1)
Sodium: 139 mEq/L (ref 135–145)

## 2011-11-04 LAB — CBC WITH DIFFERENTIAL/PLATELET
Basophils Absolute: 0.1 10*3/uL (ref 0.0–0.1)
Basophils Relative: 0.6 % (ref 0.0–3.0)
Eosinophils Absolute: 0.9 10*3/uL — ABNORMAL HIGH (ref 0.0–0.7)
Eosinophils Relative: 9 % — ABNORMAL HIGH (ref 0.0–5.0)
HCT: 37.2 % — ABNORMAL LOW (ref 39.0–52.0)
Hemoglobin: 12 g/dL — ABNORMAL LOW (ref 13.0–17.0)
Lymphocytes Relative: 16.4 % (ref 12.0–46.0)
Lymphs Abs: 1.6 10*3/uL (ref 0.7–4.0)
MCHC: 32.2 g/dL (ref 30.0–36.0)
MCV: 96.4 fl (ref 78.0–100.0)
Monocytes Absolute: 1.2 10*3/uL — ABNORMAL HIGH (ref 0.1–1.0)
Monocytes Relative: 12.3 % — ABNORMAL HIGH (ref 3.0–12.0)
Neutro Abs: 5.9 10*3/uL (ref 1.4–7.7)
Neutrophils Relative %: 61.7 % (ref 43.0–77.0)
Platelets: 280 10*3/uL (ref 150.0–400.0)
RBC: 3.85 Mil/uL — ABNORMAL LOW (ref 4.22–5.81)
RDW: 16 % — ABNORMAL HIGH (ref 11.5–14.6)
WBC: 9.5 10*3/uL (ref 4.5–10.5)

## 2011-11-04 LAB — TSH: TSH: 2.74 u[IU]/mL (ref 0.35–5.50)

## 2011-11-04 NOTE — Patient Instructions (Addendum)
You need to cut back on your salt use.  No potato chips  We need to increase the Lasix to 40 mg two times a day for the next week. In the morning and in early afternoon at 2pm.  We are checking labs today. We will check fasting labs at your next visit.   We need to get your records from [place]  We need to get an ultrasound of your heart (echo)  I will see you in about 2 weeks to recheck you.  Call the [facility] office at [phone] if you have any questions, problems or concerns.

## 2011-11-04 NOTE — Progress Notes (Addendum)
[name] Date of Birth: [birth year] Medical Record #[id number]  History of Present Illness: [name] is seen back today for a work in visit. It is a post hospital visit. He is seen for [name]. He has atrial fib/flutter with prior flutter ablation. He is in chronic atrial fibrillation. He remains on chronic anticoagulation with Pradaxa. He has a history of CHF related to tachycardia mediated CM. His last EF was 60 to 65% in September of 2011. His other issues are as noted below.   He comes in today. He is here alone. He was at [facility] last with with "heart failure". We do not have any records. He says he was there for about 20 hours and then "checked himself out". He is unsure as to what was done for him. Says that he does know that they gave him some extra lasix. He had had an increase in his salt use. He had bought potato chips on sale and ate them. He has a fair amount of salt use as a baseline. He had more swelling and was SOB. He says he feels better but still has the swelling. No chest pain. He is worried about having "blockages". Has had remote stress testing in 2008. Not on statin therapy. He likes to takes a multitude of supplements.   Current Outpatient Prescriptions on File Prior to Visit  Medication Sig Dispense Refill  . Ascorbic Acid (VITAMIN C) 500 MG tablet Take 500 mg by mouth daily.        . Cholecalciferol (VITAMIN D3) 1000 UNITS CAPS Take 1 capsule by mouth.        . Coral Calcium 1000 (390 CA) MG TABS Take 2 capsules by mouth.        . dabigatran (PRADAXA) 150 MG CAPS Take 1 capsule (150 mg total) by mouth every 12 (twelve) hours.  60 capsule  6  . digoxin (LANOXIN) 0.25 MG tablet TAKE 1 TABLET EVERY DAY  30 tablet  5  . diltiazem (CARDIZEM CD) 180 MG 24 hr capsule Take 1 capsule (180 mg total) by mouth daily.  30 capsule  0  . folic acid (FOLVITE) 800 MCG tablet Take 1 tablet (800 mcg total) by mouth daily.  30 tablet  5  . furosemide (LASIX) 40 MG  tablet Take 1 tablet (40 mg total) by mouth daily.  90 tablet  3  . gabapentin (NEURONTIN) 300 MG capsule TAKE 2 CAPSULES BY MOUTH AT BEDTIME  60 capsule  5  . HYDROcodone-acetaminophen (VICODIN) 5-500 MG per tablet Take 1 tablet by mouth every 6 (six) hours as needed.      . leflunomide (ARAVA) 10 MG tablet Take 1 tablet (10 mg total) by mouth daily.      . memantine (NAMENDA) 10 MG tablet Take 1 tablet (10 mg total) by mouth daily.  30 tablet  5  . Multiple Vitamin (MULTIVITAMIN) tablet Take 1 tablet by mouth daily.        Marland Kitchen MYRBETRIQ 50 MG TB24 50 mg daily.       . NON FORMULARY Urinozinc Prostate formula take2 tablet once a day      . OMEGA-3 1000 MG CAPS Take by mouth daily.      Marland Kitchen DISCONTD: Docusate Sodium (STOOL SOFTENER) 100 MG capsule Take 1 tablet (100 mg total) by mouth 2 (two) times daily.  60 tablet  5  . DISCONTD: PRADAXA 150 MG CAPS TAKE 1 CAPSULE (150 MG TOTAL) BY MOUTH EVERY 12 (TWELVE)  HOURS.  60 capsule  6    Allergies  Allergen Reactions  . Methotrexate     REACTION: ONLY HAS ONE KIDNEY    Past Medical History  Diagnosis Date  . Atrial flutter     s/p ablation for RVR  03/2009, pradaxa ongoing  . VENOUS INSUFFICIENCY, LEFT LEG   . PULMONARY FIBROSIS, INTERSTITIAL   . OBSTRUCTIVE SLEEP APNEA   . BENIGN POSITIONAL VERTIGO   . HYPERGLYCEMIA 2011  . Interstitial lung disease   . ALLERGIC RHINITIS   . Rheumatoid arthritis   . Chronic CHF   . PERIPHERAL NEUROPATHY   . HYPERTENSION   . HYPERLIPIDEMIA   . DEPRESSION     BH hosp 01/2011 for SI  . Anxiety   . Morbid obesity     Past Surgical History  Procedure Date  . 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 Shoulder surgery 09/2009  . Varicose vein surgery     Left Leg  . Radiofrequency ablation     History  Smoking status  . Former Smoker -- 1.0 packs/day for 3 years  . Types: Cigarettes  . Quit date: 01/20/2000  Smokeless tobacco  . Never Used    Comment: Single, lives alone. 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 His family contact would be his sister [name] [phone]    History  Alcohol Use No    Former    Family History  Problem Relation Age of Onset  . Heart attack Father 88  . Other Mother 58    Old age    Review of Systems: The review of systems is per the HPI.  All other systems were reviewed and are negative.  Physical Exam: BP 122/72  Ht 5\' 9"  (1.753 m)  Wt 228 lb 12.8 oz (103.783 kg)  BMI 33.79 kg/m2 Patient is pleasant and in no acute distress. He rambles some with his thoughts/comments.  Skin is warm and dry. Color is normal.  HEENT is unremarkable. Normocephalic/atraumatic. PERRL. Sclera are nonicteric. Neck is supple. No masses. No JVD. Lungs show fine rales throughout (has interstitial lung disease). Cardiac exam shows an irregular rhythm. His rate is controlled. Abdomen is obese but soft. Extremities are with 2 to 3+ edema. Gait and ROM are intact. No gross neurologic deficits noted.  LABORATORY DATA: PENDING FOR TODAY  Lab Results  Component Value Date   WBC 10.4 01/28/2011   HGB 15.6 01/28/2011   HCT 46.0 01/28/2011   PLT 327 01/28/2011   GLUCOSE 118* 01/28/2011   ALT 30 10/01/2010   AST 18 10/01/2010   NA 137 01/28/2011   K 4.3 01/28/2011   CL 100 01/28/2011   CREATININE 0.90 01/28/2011   BUN 11 01/28/2011   CO2 25 10/01/2010   TSH 1.532 09/22/2010   PSA 5.04* 09/26/2010   INR 1.14 09/19/2010   HGBA1C 5.9 05/18/2011   Assessment / Plan:  1. Recent CHF exacerbation - do not have any records as to details. We will try to obtain. I will update his echo. His weight is still up from his last visit here in August (8#). We will increase his Lasix to 40 mg BID for one week. We are checking labs today as well. Salt restriction is encouraged but I do not think he will be able to follow thru.   2. Chronic atrial fib - rate is controlled.   3.  Chronic anticoagulation with Pradaxa   I will  see him back in about 2 weeks. Check fasting lipids on return per his request.   Patient is agreeable to this plan and will call if any problems develop in the interim.   Addendum: His H & P from [facility] was reviewed on 11-09-11. His presentation was for edema which was felt to be related to excessive sodium. His cardiac enzymes were negative. BNP was 153. CBC was ok. CXR showed interval development of mild interstitial edema superimposed on background of underlying pulmonary fibrosis. Cardiomegaly was unchanged. He was in atrial fib which is chronic.

## 2011-11-04 NOTE — Progress Notes (Signed)
Echocardiogram performed.  

## 2011-11-16 ENCOUNTER — Telehealth: Payer: Self-pay | Admitting: Cardiology

## 2011-11-16 NOTE — Telephone Encounter (Signed)
Patient called no answer.LMTC. 

## 2011-11-16 NOTE — Telephone Encounter (Signed)
New Problem: ° ° ° °Patient returned your call.  Please call back. °

## 2011-11-17 NOTE — Telephone Encounter (Signed)
Patient called no answer.Left message on personal voice mail tried to call with echo results, never returned call,letter was mailed 11/13/11 with echo results.Advised to call back if has any questions about letter.

## 2011-11-18 ENCOUNTER — Encounter: Payer: Self-pay | Admitting: Nurse Practitioner

## 2011-11-18 ENCOUNTER — Ambulatory Visit (INDEPENDENT_AMBULATORY_CARE_PROVIDER_SITE_OTHER): Payer: Medicare Other | Admitting: Nurse Practitioner

## 2011-11-18 VITALS — BP 135/85 | HR 76 | Ht 69.0 in | Wt 224.4 lb

## 2011-11-18 DIAGNOSIS — I5032 Chronic diastolic (congestive) heart failure: Secondary | ICD-10-CM

## 2011-11-18 LAB — BASIC METABOLIC PANEL
BUN: 19 mg/dL (ref 6–23)
CO2: 30 mEq/L (ref 19–32)
Calcium: 9.3 mg/dL (ref 8.4–10.5)
Chloride: 104 mEq/L (ref 96–112)
Creatinine, Ser: 1 mg/dL (ref 0.4–1.5)
GFR: 76.8 mL/min (ref >60.00)
Glucose, Bld: 96 mg/dL (ref 70–99)
Potassium: 4.2 mEq/L (ref 3.5–5.1)
Sodium: 140 mEq/L (ref 135–145)

## 2011-11-18 NOTE — Progress Notes (Signed)
[name] Date of Birth: April 30, 1931 Medical Record #[id number]  History of Present Illness: [name] is seen back today for a 2 week check. He is seen for Dr. Swaziland. He has chronic atrial fib. Has had prior atrial flutter ablation. Remains on chronic anticoagulation with Pradaxa. He has a history of CHF related to tachycardia mediated CM and is felt to have diastolic heart failure. He was in the hospital at [facility] a couple of weeks ago. Records showed that his presentation was for edema felt to be related to excessive salt use. Cardiac enzymes were negative. BNP was 153. CBC was ok. CXR showed interval development of mild interstitial edema superimposed on background of underlying pulmonary fibrosis. Cardiomegaly was unchanged. He remains in chronic atrial fib.   His other problems include venous insufficiency, interstitial pulmonary fibrosis, OSA, vertigo, RA, neuropathy, HTN, HLD, depression and anxiety. He is obese with a BMI of 34.   I saw him 2 weeks ago. We updated his echo. Results are as noted below. Lasix was increased for a week due to evidence of volume overload.   He comes back today. He is here alone. Needs fasting labs today but he forgot to fast. No chest pain. He stayed on the 2 Lasix pills a day. Still with some swelling in his legs. Still using salt. No chest pain. Not too short of breath. Weight is down about 4 pounds.  He only wears one support stocking at a time.   Current Outpatient Prescriptions on File Prior to Visit  Medication Sig Dispense Refill  . Ascorbic Acid (VITAMIN C) 500 MG tablet Take 500 mg by mouth daily.        . Cholecalciferol (VITAMIN D3) 1000 UNITS CAPS Take 1 capsule by mouth.        . Coral Calcium 1000 (390 CA) MG TABS Take 2 capsules by mouth.        . dabigatran (PRADAXA) 150 MG CAPS Take 1 capsule (150 mg total) by mouth every 12 (twelve) hours.  60 capsule  6  . digoxin (LANOXIN) 0.25 MG tablet TAKE 1 TABLET EVERY DAY  30 tablet  5  .  diltiazem (CARDIZEM CD) 180 MG 24 hr capsule Take 1 capsule (180 mg total) by mouth daily.  30 capsule  0  . Docusate Sodium 100 MG capsule Take 100 mg by mouth daily.      . folic acid (FOLVITE) 800 MCG tablet Take 1 tablet (800 mcg total) by mouth daily.  30 tablet  5  . furosemide (LASIX) 40 MG tablet Take 1 tablet (40 mg total) by mouth daily.  90 tablet  3  . gabapentin (NEURONTIN) 300 MG capsule TAKE 2 CAPSULES BY MOUTH AT BEDTIME  60 capsule  5  . HYDROcodone-acetaminophen (VICODIN) 5-500 MG per tablet Take 1 tablet by mouth every 6 (six) hours as needed.      . leflunomide (ARAVA) 10 MG tablet Take 1 tablet (10 mg total) by mouth daily.      . Melatonin 1 MG CAPS Take 1 mg by mouth daily.      . memantine (NAMENDA) 10 MG tablet Take 1 tablet (10 mg total) by mouth daily.  30 tablet  5  . Multiple Vitamin (MULTIVITAMIN) tablet Take 1 tablet by mouth daily.        Marland Kitchen MYRBETRIQ 50 MG TB24 50 mg daily.       . NON FORMULARY Urinozinc Prostate formula take2 tablet once a day      .  OMEGA-3 1000 MG CAPS Take by mouth daily.        Allergies  Allergen Reactions  . Methotrexate     REACTION: ONLY HAS ONE KIDNEY    Past Medical History  Diagnosis Date  . Atrial flutter     s/p ablation for RVR  03/2009, pradaxa ongoing  . VENOUS INSUFFICIENCY, LEFT LEG   . PULMONARY FIBROSIS, INTERSTITIAL   . OBSTRUCTIVE SLEEP APNEA   . BENIGN POSITIONAL VERTIGO   . HYPERGLYCEMIA 2011  . Interstitial lung disease   . ALLERGIC RHINITIS   . Rheumatoid arthritis   . Chronic CHF   . PERIPHERAL NEUROPATHY   . HYPERTENSION   . HYPERLIPIDEMIA   . DEPRESSION     BH hosp 01/2011 for SI  . Anxiety   . Morbid obesity     Past Surgical History  Procedure Date  . 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 Shoulder surgery 09/2009  . Varicose vein surgery     Left Leg  . Radiofrequency ablation     History  Smoking status  . Former Smoker -- 1.0  packs/day for 3 years  . Types: Cigarettes  . Quit date: 01/20/2000  Smokeless tobacco  . Never Used  Comment: Single, lives alone. 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 Heart attack Father 75  . Other Mother 42    Old age    Review of Systems: The review of systems is per the HPI.  All other systems were reviewed and are negative.  Physical Exam: BP 150/76  Pulse 76  Ht 5\' 9"  (1.753 m)  Wt 224 lb 6.4 oz (101.787 kg)  BMI 33.14 kg/m2 Repeat blood pressure by me is down to 135/85.  Patient is pleasant and in no acute distress. Skin is warm and dry. Color is normal.  HEENT is unremarkable. Normocephalic/atraumatic. PERRL. Sclera are nonicteric. Neck is supple. No masses. No JVD. Lungs are coarse. Cardiac exam shows an irregular rhythm. His rate is controlled. Abdomen is obese but soft. Extremities are with 1+ edema and primarily on the right. He has a support stocking on the left leg. Gait and ROM are intact. No gross neurologic deficits noted.   LABORATORY DATA: BMET is pending for today.   Lab Results  Component Value Date   WBC 9.5 11/04/2011   HGB 12.0* 11/04/2011   HCT 37.2* 11/04/2011   PLT 280.0 11/04/2011   GLUCOSE 89 11/04/2011   ALT 30 10/01/2010   AST 18 10/01/2010   NA 139 11/04/2011   K 4.5 11/04/2011   CL 105 11/04/2011   CREATININE 0.9 11/04/2011   BUN 19 11/04/2011   CO2 29 11/04/2011   TSH 2.74 11/04/2011   PSA 5.04* 09/26/2010   INR 1.14 09/19/2010   HGBA1C 5.9 05/18/2011    Echo Study Conclusions  - Left ventricle: The cavity size was mildly dilated. There was severe concentric hypertrophy. The estimated ejection fraction was 55%. - Mitral valve: Mild regurgitation. - Left atrium: The atrium was moderately dilated. - Right atrium: The atrium was moderately dilated. -  Atrial septum: No defect or patent foramen ovale was identified. - Pulmonary arteries: PA peak pressure: 41mm Hg (S). -  Impressions: Restrictive mitral inflow pattern  Assessment / Plan:  1. Chronic diastolic heart failure. I have left him on his current regimen and will continue this as long as his labs are ok. He does only have one kidney. I told him that if his swelling continues to improve and his weight goes down then he can cut back to just one a day. He is going to have a difficult and chronic issue with salt restriction. We will check a BMET today. I will see him back in about a month.   2. Chronic atrial fib with controlled VR.   3. Chronic anticoagulation - on Pradaxa  4. Interstitial lung disease/fibrosis  We are checking a BMET today. He is not fasting today.  Salt restriction is once again encouraged. I will see him back in a month.  Patient is agreeable to this plan and will call if any problems develop in the interim.

## 2011-11-18 NOTE — Patient Instructions (Addendum)
You can use Ms. Dash products or pepper  Stay away from salt  Stay on your current medicines including the fluid pill two times a day  We need to check your labs today  I will see you in a month with fasting labs  Call the [facility] office at [phone] if you have any questions, problems or concerns.

## 2011-11-19 ENCOUNTER — Encounter: Payer: Self-pay | Admitting: *Deleted

## 2011-12-15 ENCOUNTER — Ambulatory Visit: Payer: Self-pay | Admitting: Nurse Practitioner

## 2011-12-16 ENCOUNTER — Ambulatory Visit (INDEPENDENT_AMBULATORY_CARE_PROVIDER_SITE_OTHER): Payer: Medicare Other | Admitting: Nurse Practitioner

## 2011-12-16 ENCOUNTER — Encounter: Payer: Self-pay | Admitting: Nurse Practitioner

## 2011-12-16 VITALS — BP 140/72 | HR 72 | Ht 69.0 in | Wt 224.1 lb

## 2011-12-16 DIAGNOSIS — I5032 Chronic diastolic (congestive) heart failure: Secondary | ICD-10-CM

## 2011-12-16 LAB — BASIC METABOLIC PANEL
BUN: 21 mg/dL (ref 6–23)
CO2: 29 mEq/L (ref 19–32)
Calcium: 8.5 mg/dL (ref 8.4–10.5)
Chloride: 102 mEq/L (ref 96–112)
Creatinine, Ser: 1 mg/dL (ref 0.4–1.5)
GFR: 79.53 mL/min (ref >60.00)
Glucose, Bld: 146 mg/dL — ABNORMAL HIGH (ref 70–99)
Potassium: 4.3 mEq/L (ref 3.5–5.1)
Sodium: 137 mEq/L (ref 135–145)

## 2011-12-16 MED ORDER — FUROSEMIDE 40 MG PO TABS: ORAL_TABLET | ORAL | Status: DC

## 2011-12-16 NOTE — Patient Instructions (Addendum)
Stay on your current medicines but you can take between one and two of your fluid pills (Lasix)  Use support stockings. I think this can help with the swelling  We are going to check labs today  See Dr. Swaziland in February  Try to stay away from the salt  Call the [facility] office at [phone] if you have any questions, problems or concerns.

## 2011-12-16 NOTE — Progress Notes (Signed)
[name] Date of Birth: Aug 06, 1931 Medical Record #[id number]  History of Present Illness: [name] is seen back today for a one month check. He is seen for Dr. Sw[place]il[place]. He has chronic atrial fib. Has had prior atrial flutter ablation. Remains on chronic anticoagulation with Pradaxa. He has a history of COF related to tachycardia mediated CM and is felt to have diastolic heart failure. He has had a recent hospitalization for volume overload. Felt to be due to excessive salt use. His other problems include venous insufficiency, interstitial pulmonary fibrosis, OSA, vertigo, RA, neuropathy, HTN, HLD, depression and anxiety. He is obese with a BMI of 34.   He has had his echo updated. EF is 55%. Has mild MR, moderate LAE & RAE and severe hypertrophy. Still needs to get some fasting labs but has had trouble remembering that he has to come here fasting.   He comes in today. He is here alone. He is doing ok. He is still eating some chips (had bought some on sale and "just had to eat"). Weight is unchanged. Seems to be feeling ok. No chest pain. More limited by his legs but he is due for his RA injection in early December. Still with some swelling. Using only one support stocking. Wants to change his Lasix to just one to two a day. He is trying to use more Ms. Dash.   Current Outpatient Prescriptions on File Prior to Visit  Medication Sig Dispense Refill  . Ascorbic Acid (VITAMIN C) 500 MG tablet Take 500 mg by mouth daily.        . Cholecalciferol (VITAMIN D3) 1000 UNITS CAPS Take 2 capsules by mouth.       . Coral Calcium 1000 (390 CA) MG TABS Take 2 capsules by mouth.        . dabigatran (PRADAXA) 150 MG CAPS Take 1 capsule (150 mg total) by mouth every 12 (twelve) hours.  60 capsule  6  . digoxin (LANOXIN) 0.25 MG tablet TAKE 1 TABLET EVERY DAY  30 tablet  5  . diltiazem (CARDIZEM CD) 180 MG 24 hr capsule Take 1 capsule (180 mg total) by mouth daily.  30 capsule  0  . Docusate Sodium 100 MG  capsule Take 100 mg by mouth daily.      . folic acid (FOLVITE) 800 MCG tablet Take 1 tablet (800 mcg total) by mouth daily.  30 tablet  5  . gabapentin (NEURONTIN) 300 MG capsule TAKE 2 CAPSULES BY MOUTH AT BEDTIME  60 capsule  5  . HYDROcodone-acetaminophen (VICODIN) 5-500 MG per tablet Take 1 tablet by mouth every 6 (six) hours as needed.      . leflunomide (ARAVA) 10 MG tablet Take 1 tablet (10 mg total) by mouth daily.      . Melatonin 1 MG CAPS Take 10 mg by mouth daily.       . memantine (NAMENDA) 10 MG tablet Take 1 tablet (10 mg total) by mouth daily.  30 tablet  5  . Multiple Vitamin (MULTIVITAMIN) tablet Take 1 tablet by mouth daily.        Marland Kitchen MYRBETRIQ 50 MG TB24 50 mg daily.       . NON FORMULARY Urinozinc Prostate formula take2 tablet once a day      . OMEGA-3 1000 MG CAPS Take by mouth daily.      . [DISCONTINUED] furosemide (LASIX) 40 MG tablet Take 40 mg by mouth 2 (two) times daily.  Allergies  Allergen Reactions  . Methotrexate     REACTION: ONLY HAS ONE KIDNEY    Past Medical History  Diagnosis Date  . Atrial flutter     s/p ablation for RVR  03/2009, pradaxa ongoing  . VENOUS INSUFFICIENCY, LEFT LEG   . PULMONARY FIBROSIS, INTERSTITIAL   . OBSTRUCTIVE SLEEP APNEA   . BENIGN POSITIONAL VERTIGO   . HYPERGLYCEMIA 2011  . Interstitial lung disease   . ALLERGIC RHINITIS   . Rheumatoid arthritis   . Chronic CHF   . PERIPHERAL NEUROPATHY   . HYPERTENSION   . HYPERLIPIDEMIA   . DEPRESSION     [facility] hosp 01/2011 for SI  . Anxiety   . Morbid obesity     Past Surgical History  Procedure Date  . 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 Shoulder surgery 09/2009  . Varicose vein surgery     Left Leg  . Radiofrequency ablation     History  Smoking status  . Former Smoker -- 1.0 packs/day for 3 years  . Types: Cigarettes  . Quit date: 01/20/2000  Smokeless tobacco  . Never Used    Comment: Single,  lives alone. 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 Heart attack Father 82  . Other Mother 77    Old age    Review of Systems: The review of systems is per the HPI.  All other systems were reviewed and are negative.  Physical Exam: BP 140/72  Pulse 72  Ht 5\' 9"  (1.753 m)  Wt 224 lb 1.9 oz (101.66 kg)  BMI 33.10 kg/m2 Patient is very pleasant and in no acute distress. He remains obese. Skin is warm and dry. Color is normal.  HEENT is unremarkable. Normocephalic/atraumatic. PERRL. Sclera are nonicteric. Neck is supple. No masses. No JVD. Lungs are clear. Cardiac exam shows an irregular rhythm. Rate is controlled. Abdomen is obese but soft. Extremities are without edema. Gait and ROM are intact. No gross neurologic deficits noted.   LABORATORY DATA: BMET is pending  Lab Results  Component Value Date   WBC 9.5 11/04/2011   HGB 12.0* 11/04/2011   HCT 37.2* 11/04/2011   PLT 280.0 11/04/2011   GLUCOSE 96 11/18/2011   ALT 30 10/01/2010   AST 18 10/01/2010   NA 140 11/18/2011   K 4.2 11/18/2011   CL 104 11/18/2011   CREATININE 1.0 11/18/2011   BUN 19 11/18/2011   CO2 30 11/18/2011   TSH 2.74 11/04/2011   PSA 5.04* 09/26/2010   INR 1.14 09/19/2010   HGBA1C 5.9 05/18/2011     Assessment / Plan:  1. Chronic diastolic heart failure - seems fairly compensated. Weight is stable. Needs to avoid the salt. Will recheck a BMET today. He can take between one and two fluid pills.   2. Chronic atrial fib - rate is controlled.   3. Chronic anticoagulation - on Pradaxa - no problems noted.   4. Interstitial lung disease/fibrosis - seems to be at his baseline.   We will see him back in February. I have encouraged him to use some support stockings.   Patient is agreeable to this plan and will call if  any  problems develop in the interim.

## 2011-12-18 ENCOUNTER — Other Ambulatory Visit (HOSPITAL_COMMUNITY): Payer: Self-pay | Admitting: *Deleted

## 2011-12-21 ENCOUNTER — Inpatient Hospital Stay (HOSPITAL_COMMUNITY): Admission: RE | Admit: 2011-12-21 | Payer: Self-pay | Source: Ambulatory Visit

## 2011-12-23 ENCOUNTER — Encounter (HOSPITAL_COMMUNITY)
Admission: RE | Admit: 2011-12-23 | Discharge: 2011-12-23 | Disposition: A | Discharge status: Home / Self Care | Payer: Medicare Other | Source: Ambulatory Visit | Attending: Rheumatology | Admitting: Rheumatology

## 2011-12-23 DIAGNOSIS — M069 Rheumatoid arthritis, unspecified: Secondary | ICD-10-CM | POA: Insufficient documentation

## 2011-12-23 MED ORDER — SODIUM CHLORIDE 0.9 % IV SOLN
INTRAVENOUS | Status: AC
  Administered 2011-12-23 (×2): via INTRAVENOUS

## 2011-12-23 MED ORDER — SODIUM CHLORIDE 0.9 % IV SOLN
1000.0000 mg | INTRAVENOUS | Status: DC
  Administered 2011-12-23: 1000 mg via INTRAVENOUS
  Filled 2011-12-23: qty 100

## 2011-12-23 MED ORDER — METHYLPREDNISOLONE SODIUM SUCC 125 MG IJ SOLR
100.0000 mg | INTRAMUSCULAR | Status: DC
  Administered 2011-12-23: 100 mg via INTRAVENOUS

## 2011-12-23 MED ORDER — METHYLPREDNISOLONE SODIUM SUCC 125 MG IJ SOLR
INTRAMUSCULAR | Status: AC
  Administered 2011-12-23: 100 mg via INTRAVENOUS
  Filled 2011-12-23: qty 2

## 2012-01-06 ENCOUNTER — Encounter (HOSPITAL_COMMUNITY): Payer: Self-pay

## 2012-01-19 ENCOUNTER — Telehealth: Payer: Self-pay | Admitting: Cardiology

## 2012-01-19 MED ORDER — FUROSEMIDE 40 MG PO TABS: ORAL_TABLET | ORAL | Status: DC

## 2012-01-19 NOTE — Telephone Encounter (Signed)
plz return call to pt [phone] regarding medication dosage.

## 2012-01-19 NOTE — Telephone Encounter (Signed)
Patient called stated he takes lasix 40 mg 1 to 2 tablets every day.States he has been taking 2 tablets daily for the past several days due to being on his feet more and he needs prescription called to [facility] in [place].Lasix prescription sent to [facility] in [place].

## 2012-01-22 ENCOUNTER — Other Ambulatory Visit: Payer: Self-pay

## 2012-01-22 MED ORDER — FUROSEMIDE 40 MG PO TABS: ORAL_TABLET | ORAL | Status: DC

## 2012-01-25 ENCOUNTER — Other Ambulatory Visit: Payer: Self-pay | Admitting: Internal Medicine

## 2012-01-27 ENCOUNTER — Telehealth: Payer: Self-pay | Admitting: Nurse Practitioner

## 2012-01-27 NOTE — Telephone Encounter (Signed)
Pt's fluid pill was increased to up to two a day , so he is running out quickly, because he is taking two or two and a half a day, so requesting #90 and would like this change on med list so he doesn't have to call every month, this was changed a few months ago, has four pills left, needs refilled asap to [facility] in [place]

## 2012-01-28 MED ORDER — FUROSEMIDE 40 MG PO TABS: ORAL_TABLET | ORAL | Status: DC

## 2012-02-18 ENCOUNTER — Encounter: Payer: Self-pay | Admitting: Cardiology

## 2012-02-29 ENCOUNTER — Ambulatory Visit (INDEPENDENT_AMBULATORY_CARE_PROVIDER_SITE_OTHER): Payer: Medicare Other | Admitting: Cardiology

## 2012-02-29 ENCOUNTER — Encounter: Payer: Self-pay | Admitting: Cardiology

## 2012-02-29 VITALS — BP 140/80 | HR 84 | Ht 69.0 in | Wt 225.4 lb

## 2012-02-29 DIAGNOSIS — I4891 Unspecified atrial fibrillation: Secondary | ICD-10-CM

## 2012-02-29 DIAGNOSIS — R0609 Other forms of dyspnea: Secondary | ICD-10-CM

## 2012-02-29 DIAGNOSIS — I482 Chronic atrial fibrillation, unspecified: Secondary | ICD-10-CM

## 2012-02-29 DIAGNOSIS — I10 Essential (primary) hypertension: Secondary | ICD-10-CM

## 2012-02-29 DIAGNOSIS — I4892 Unspecified atrial flutter: Secondary | ICD-10-CM

## 2012-02-29 DIAGNOSIS — I5022 Chronic systolic (congestive) heart failure: Secondary | ICD-10-CM

## 2012-02-29 DIAGNOSIS — R0600 Dyspnea, unspecified: Secondary | ICD-10-CM

## 2012-02-29 NOTE — Patient Instructions (Addendum)
Continue sodium restriction. Weigh daily.  Increase lasix to 40 mg in the morning and 20 mg in the afternoon. I would like for you to lose 5 lbs of weight.  We will see you again in 3 months.

## 2012-02-29 NOTE — Progress Notes (Signed)
[name] Date of Birth: [birth year] Medical Record #[id number]  History of Present Illness: [name] is seen for a follow up visit. He has chronic atrial fib. Has had prior atrial flutter ablation. Remains on chronic anticoagulation with Pradaxa. He has a history of CHF related to tachycardia mediated CM and is felt to have diastolic heart failure His other problems include venous insufficiency, interstitial pulmonary fibrosis, OSA, vertigo, RA, neuropathy, HTN, HLD, depression and anxiety. He is obese with a BMI of 34.  Today he complains that he has more SOB with exertion and increased swelling. He has been taking 40 mg lasix daily. Doesn't weigh himself. SOB is worse in cold damp weather. Has been unloading an old trailer and notes SOB with this.  Current Outpatient Prescriptions on File Prior to Visit  Medication Sig Dispense Refill  . Ascorbic Acid (VITAMIN C) 500 MG tablet Take 500 mg by mouth daily.        . Cholecalciferol (VITAMIN D3) 1000 UNITS CAPS Take 2 capsules by mouth.       . Coral Calcium 1000 (390 CA) MG TABS Take 2 capsules by mouth.        . dabigatran (PRADAXA) 150 MG CAPS Take 1 capsule (150 mg total) by mouth every 12 (twelve) hours.  60 capsule  6  . digoxin (LANOXIN) 0.25 MG tablet TAKE 1 TABLET EVERY DAY  30 tablet  5  . diltiazem (CARDIZEM CD) 180 MG 24 hr capsule TAKE 1 CAPSULE BY MOUTH ONCE A DAY.  30 capsule  5  . Docusate Sodium 100 MG capsule Take 100 mg by mouth daily.      . folic acid (FOLVITE) 800 MCG tablet Take 1 tablet (800 mcg total) by mouth daily.  30 tablet  5  . furosemide (LASIX) 40 MG tablet May take between one and two pills per day  180 tablet  0  . gabapentin (NEURONTIN) 300 MG capsule TAKE 2 CAPSULES BY MOUTH AT BEDTIME  60 capsule  5  . HYDROcodone-acetaminophen (VICODIN) 5-500 MG per tablet Take 1 tablet by mouth every 6 (six) hours as needed.      . leflunomide (ARAVA) 10 MG tablet Take 1 tablet (10 mg total) by mouth daily.      .  Melatonin 1 MG CAPS Take 10 mg by mouth daily.       . Multiple Vitamin (MULTIVITAMIN) tablet Take 1 tablet by mouth daily.        Marland Kitchen MYRBETRIQ 50 MG TB24 50 mg daily.       Marland Kitchen NAMENDA 10 MG tablet TAKE (1) TABLET BY MOUTH ONCE DAILY.  30 tablet  5  . NON FORMULARY Urinozinc Prostate formula take2 tablet once a day      . NON FORMULARY at bedtime. c pap      . OMEGA-3 1000 MG CAPS Take by mouth daily.       No current facility-administered medications on file prior to visit.    Allergies  Allergen Reactions  . Methotrexate     REACTION: ONLY HAS ONE KIDNEY    Past Medical History  Diagnosis Date  . Atrial flutter     s/p ablation for RVR  03/2009, pradaxa ongoing  . VENOUS INSUFFICIENCY, LEFT LEG   . PULMONARY FIBROSIS, INTERSTITIAL   . OBSTRUCTIVE SLEEP APNEA   . BENIGN POSITIONAL VERTIGO   . HYPERGLYCEMIA 2011  . Interstitial lung disease   . ALLERGIC RHINITIS   . Rheumatoid arthritis   .  Chronic CHF   . PERIPHERAL NEUROPATHY   . HYPERTENSION   . HYPERLIPIDEMIA   . DEPRESSION     [facility] hosp 01/2011 for SI  . Anxiety   . Morbid obesity     Past Surgical History  Procedure Laterality Date  . 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 Shoulder surgery  09/2009  . Varicose vein surgery      Left Leg  . Radiofrequency ablation      History  Smoking status  . Former Smoker -- 1.00 packs/day for 3 years  . Types: Cigarettes  . Quit date: 01/20/2000  Smokeless tobacco  . Never Used    Comment: Single, lives alone. 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 His family contact would be his sister [name] [phone]    History  Alcohol Use No    Comment: Former    Family History  Problem Relation Age of Onset  . Heart attack Father 56  . Other Mother 29    Old age    Review of Systems: The review of systems is per the HPI.  All other systems were reviewed and are negative.  Physical  Exam: BP 140/80  Pulse 84  Ht 5\' 9"  (1.753 m)  Wt 225 lb 6.4 oz (102.241 kg)  BMI 33.27 kg/m2 Patient is very pleasant and in no acute distress. He remains obese. Skin is warm and dry. Color is normal.  HEENT is unremarkable. Normocephalic/atraumatic. PERRL. Sclera are nonicteric. Neck is supple. No masses. No JVD. Lungs reveal bibasilar rales L>R. Cardiac exam shows an irregular rhythm. Rate is controlled. Abdomen is obese but soft. Extremities show 1+ edema. Gait and ROM are intact. No gross neurologic deficits noted.   LABORATORY DATA:    Assessment / Plan:  1. Chronic diastolic heart failure - appears volume overloaded. Needs to avoid the salt. Will increase lasix to 40 mg in the am and 20 mg in the pm. Weigh daily. Follow up in 3 months with BMET and BNP.  2. Chronic atrial fib - rate is controlled.   3. Chronic anticoagulation - on Pradaxa - no problems noted.   4. Interstitial lung disease/fibrosis - seems to be at his baseline.

## 2012-03-01 ENCOUNTER — Ambulatory Visit: Payer: Self-pay | Admitting: Cardiology

## 2012-03-08 ENCOUNTER — Other Ambulatory Visit: Payer: Self-pay | Admitting: Cardiology

## 2012-03-08 NOTE — Telephone Encounter (Signed)
Per refill line  Pt's dose has changed and he will be out before he can get more. Pt called multiple times on 2/17.

## 2012-03-09 MED ORDER — FUROSEMIDE 40 MG PO TABS: ORAL_TABLET | ORAL | Status: DC

## 2012-03-21 ENCOUNTER — Other Ambulatory Visit: Payer: Self-pay | Admitting: Cardiology

## 2012-04-11 ENCOUNTER — Telehealth: Payer: Self-pay | Admitting: Cardiology

## 2012-04-11 NOTE — Telephone Encounter (Signed)
New Prob    Pt is needing clarification on lasix prescription. Would like to speak to nurse.

## 2012-04-11 NOTE — Telephone Encounter (Signed)
LMTCB

## 2012-04-12 NOTE — Telephone Encounter (Signed)
F/u   Patient returning nurse call to discuss medication

## 2012-04-12 NOTE — Telephone Encounter (Signed)
Spoke to patient he stated he has been taking lasix 40 mg 2 tablets in am and 40 mg 1 tablet in pm.Patient states he has lost 20 lbs since 02/29/12.Patient was told Dr.Jordan out of office today will check with Dr.Jordan 04/13/12 with correct dose of lasix and send refill to [facility].

## 2012-04-15 NOTE — Telephone Encounter (Signed)
Returned call to patient no answer.LMTC. 

## 2012-04-18 NOTE — Telephone Encounter (Signed)
F/u   Pt returned your [phone] is the best # to reach the pt

## 2012-04-19 MED ORDER — FUROSEMIDE 40 MG PO TABS: ORAL_TABLET | ORAL | Status: DC

## 2012-04-19 NOTE — Telephone Encounter (Signed)
Spoke with patient was told spoke with Dr.Jordan he advised to continue taking lasix like you have been doing.Refill sent to [facility].

## 2012-05-10 ENCOUNTER — Telehealth: Payer: Self-pay | Admitting: Cardiology

## 2012-05-10 MED ORDER — FUROSEMIDE 40 MG PO TABS: ORAL_TABLET | ORAL | Status: DC

## 2012-05-10 NOTE — Telephone Encounter (Signed)
New problem   Pt calling in reference to his water pill he had spoken to you about weeks ago.. he didn't know the name of it. Please call pt

## 2012-05-10 NOTE — Telephone Encounter (Signed)
Returned call to patient he stated he needed a new prescription for lasix called to [facility].Patient was told sent lasix prescription to [facility]/19/12.[facility].Spoke to pharmacist at [facility] lasix 40 mg 2 tablets in am 40 mg 1 tablet in pm # 90 refill x 6.

## 2012-05-23 ENCOUNTER — Other Ambulatory Visit: Payer: Self-pay | Admitting: *Deleted

## 2012-05-23 MED ORDER — GABAPENTIN 300 MG PO CAPS: ORAL_CAPSULE | ORAL | Status: DC

## 2012-06-01 ENCOUNTER — Encounter: Payer: Self-pay | Admitting: Cardiology

## 2012-06-01 ENCOUNTER — Ambulatory Visit (INDEPENDENT_AMBULATORY_CARE_PROVIDER_SITE_OTHER): Payer: Medicare Other | Admitting: Cardiology

## 2012-06-01 VITALS — BP 140/68 | HR 70 | Ht 68.0 in | Wt 209.8 lb

## 2012-06-01 DIAGNOSIS — I5032 Chronic diastolic (congestive) heart failure: Secondary | ICD-10-CM

## 2012-06-01 DIAGNOSIS — I4891 Unspecified atrial fibrillation: Secondary | ICD-10-CM

## 2012-06-01 DIAGNOSIS — I482 Chronic atrial fibrillation, unspecified: Secondary | ICD-10-CM

## 2012-06-01 DIAGNOSIS — I509 Heart failure, unspecified: Secondary | ICD-10-CM

## 2012-06-01 DIAGNOSIS — I10 Essential (primary) hypertension: Secondary | ICD-10-CM

## 2012-06-01 LAB — BASIC METABOLIC PANEL
BUN: 16 mg/dL (ref 6–23)
CO2: 31 mEq/L (ref 19–32)
Calcium: 8.9 mg/dL (ref 8.4–10.5)
Glucose, Bld: 95 mg/dL (ref 70–99)
Sodium: 140 mEq/L (ref 135–145)

## 2012-06-01 NOTE — Progress Notes (Signed)
[name] Date of Birth: [birth year] Medical Record #[id number]  History of Present Illness: [name] is seen for a follow up visit. He has chronic atrial fib. Has had prior atrial flutter ablation. Remains on chronic anticoagulation with Pradaxa. He has a history of CHF related to tachycardia mediated CM and  diastolic heart failure. His other problems include venous insufficiency, interstitial pulmonary fibrosis, OSA, vertigo, RA, neuropathy, HTN, HLD, depression and anxiety.  On his last visit we increased his diuretic therapy. This has significantly improved his edema and shortness of breath. He has lost approximately 15 pounds. He is feeling well. He denies any tachycardia or chest pain.  Current Outpatient Prescriptions on File Prior to Visit  Medication Sig Dispense Refill  . Ascorbic Acid (VITAMIN C) 500 MG tablet Take 500 mg by mouth daily.        . Cholecalciferol (VITAMIN D3) 1000 UNITS CAPS Take 2 capsules by mouth.       . Coral Calcium 1000 (390 CA) MG TABS Take 2 capsules by mouth.        . dabigatran (PRADAXA) 150 MG CAPS Take 1 capsule (150 mg total) by mouth every 12 (twelve) hours.  60 capsule  6  . DIGOX 0.25 MG tablet TAKE (1) TABLET BY MOUTH ONCE DAILY.  30 tablet  10  . diltiazem (CARDIZEM CD) 180 MG 24 hr capsule TAKE 1 CAPSULE BY MOUTH ONCE A DAY.  30 capsule  5  . Docusate Sodium 100 MG capsule Take 100 mg by mouth daily.      . folic acid (FOLVITE) 800 MCG tablet Take 1 tablet (800 mcg total) by mouth daily.  30 tablet  5  . furosemide (LASIX) 40 MG tablet Take 40 mg 2 tablets in am and 40 mg 1 in pm  90 tablet  6  . gabapentin (NEURONTIN) 300 MG capsule TAKE 2 CAPSULES BY MOUTH AT BEDTIME  60 capsule  2  . HYDROcodone-acetaminophen (VICODIN) 5-500 MG per tablet Take 1 tablet by mouth every 6 (six) hours as needed.      . leflunomide (ARAVA) 10 MG tablet Take 1 tablet (10 mg total) by mouth daily.      . Melatonin 1 MG CAPS Take 10 mg by mouth daily.       .  Multiple Vitamin (MULTIVITAMIN) tablet Take 1 tablet by mouth daily.        Marland Kitchen MYRBETRIQ 50 MG TB24 50 mg daily.       Marland Kitchen NAMENDA 10 MG tablet TAKE (1) TABLET BY MOUTH ONCE DAILY.  30 tablet  5  . NON FORMULARY Urinozinc Prostate formula take2 tablet once a day      . NON FORMULARY at bedtime. c pap      . OMEGA-3 1000 MG CAPS Take by mouth daily.       No current facility-administered medications on file prior to visit.    Allergies  Allergen Reactions  . Methotrexate     REACTION: ONLY HAS ONE KIDNEY    Past Medical History  Diagnosis Date  . Atrial flutter     s/p ablation for RVR  03/2009, pradaxa ongoing  . VENOUS INSUFFICIENCY, LEFT LEG   . PULMONARY FIBROSIS, INTERSTITIAL   . OBSTRUCTIVE SLEEP APNEA   . BENIGN POSITIONAL VERTIGO   . HYPERGLYCEMIA 2011  . Interstitial lung disease   . ALLERGIC RHINITIS   . Rheumatoid arthritis   . Chronic diastolic CHF (congestive heart failure)   . PERIPHERAL NEUROPATHY   .  HYPERTENSION   . HYPERLIPIDEMIA   . DEPRESSION     [facility] hosp 01/2011 for SI  . Anxiety   . Morbid obesity     Past Surgical History  Procedure Laterality Date  . 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 Shoulder surgery  09/2009  . Varicose vein surgery      Left Leg  . Radiofrequency ablation      History  Smoking status  . Former Smoker -- 1.00 packs/day for 3 years  . Types: Cigarettes  . Quit date: 01/20/2000  Smokeless tobacco  . Never Used    Comment: Single, lives alone. 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 His family contact would be his sister [name] [phone]    History  Alcohol Use No    Comment: Former    Family History  Problem Relation Age of Onset  . Heart attack Father 41  . Other Mother 54    Old age    Review of Systems: The review of systems is per the HPI.  All other systems were reviewed and are negative.  Physical Exam: BP 140/68   Pulse 70  Ht 5\' 8"  (1.727 m)  Wt 209 lb 12.8 oz (95.165 kg)  BMI 31.91 kg/m2 Patient is very pleasant and in no acute distress. He remains obese. Skin is warm and dry. Color is normal.  HEENT is unremarkable. Normocephalic/atraumatic. PERRL. Sclera are nonicteric. Neck is supple. No masses. No JVD. Lungs reveal bibasilar crackles L>R. Cardiac exam shows an irregular rhythm. Rate is controlled. Abdomen is obese but soft. Extremities show 1+ edema on the left. Gait and ROM are intact. No gross neurologic deficits noted.   LABORATORY DATA:  ECG demonstrates atrial fibrillation with a rate of 67 beats per minute. He has nonspecific ST T wave abnormality.  Assessment / Plan:  1. Chronic diastolic heart failure - significant improvement with increased diuretic dose. 15 pound weight loss. We'll continue his current therapy and to check a basic metabolic panel and Dig level today.  2. Chronic atrial fib - rate is controlled on digoxin and diltiazem.  3. Chronic anticoagulation - on Pradaxa - no problems noted.   4. Interstitial lung disease/fibrosis - seems to be at his baseline.

## 2012-06-01 NOTE — Patient Instructions (Signed)
Continue your current therapy  We will check lab work today  I will see you in 4 months. 

## 2012-06-01 NOTE — Addendum Note (Signed)
Addended by: Ansley Mangiapane, [place] N on: 06/01/2012 02:37 PM   Modules accepted: Orders

## 2012-06-03 ENCOUNTER — Other Ambulatory Visit: Payer: Self-pay

## 2012-06-03 ENCOUNTER — Ambulatory Visit: Payer: Self-pay | Admitting: Cardiology

## 2012-06-03 ENCOUNTER — Telehealth: Payer: Self-pay | Admitting: Cardiology

## 2012-06-03 DIAGNOSIS — I4891 Unspecified atrial fibrillation: Secondary | ICD-10-CM

## 2012-06-03 MED ORDER — DABIGATRAN ETEXILATE MESYLATE 150 MG PO CAPS: 150.0000 mg | ORAL_CAPSULE | Freq: Two times a day (BID) | ORAL | Status: DC

## 2012-06-03 NOTE — Telephone Encounter (Signed)
Spoke with [place] at [facility]. They received blood for digoxin level with paperwork but sample had no pt name, date of birth or other identifying information on tube.  They are unable to run this sample. I spoke with pt and he will come in for repeat digoxin level on Jun 06, 2012 around 10 AM. He will hold that morning's digoxin until after lab drawn.

## 2012-06-03 NOTE — Telephone Encounter (Signed)
New Problem:    Called in wanting to ask a question regarding lab orders.  Please call back.

## 2012-06-03 NOTE — Telephone Encounter (Signed)
3. Chronic anticoagulation - on Pradaxa - no problems noted.   Patient Instructions  Continue your current therapy.  We will check lab work today.  I will see you in 4 months.  Chart Reviewed By  Charna Elizabeth, LPN  on 04/27/8117  4:51 PM  Newt Lukes, MD  on 06/01/2012  6:18 PM     Previous Visit  Provider Department Encounter #  05/23/2012 11:49 AM [name], MD [facility] [id number]

## 2012-06-06 ENCOUNTER — Other Ambulatory Visit: Payer: Medicare Other

## 2012-06-06 DIAGNOSIS — I4891 Unspecified atrial fibrillation: Secondary | ICD-10-CM

## 2012-06-17 ENCOUNTER — Other Ambulatory Visit: Payer: Self-pay | Admitting: *Deleted

## 2012-06-17 MED ORDER — DILTIAZEM HCL ER COATED BEADS 180 MG PO CP24: ORAL_CAPSULE | ORAL | Status: DC

## 2012-07-25 ENCOUNTER — Encounter: Payer: Self-pay | Admitting: Internal Medicine

## 2012-07-25 ENCOUNTER — Ambulatory Visit (INDEPENDENT_AMBULATORY_CARE_PROVIDER_SITE_OTHER): Payer: Medicare Other | Admitting: Internal Medicine

## 2012-07-25 VITALS — BP 126/60 | HR 73 | Temp 98.2°F | Resp 16 | Wt 215.0 lb

## 2012-07-25 DIAGNOSIS — R05 Cough: Secondary | ICD-10-CM

## 2012-07-25 DIAGNOSIS — J209 Acute bronchitis, unspecified: Secondary | ICD-10-CM

## 2012-07-25 MED ORDER — AZITHROMYCIN 250 MG PO TABS: ORAL_TABLET | ORAL | Status: DC

## 2012-07-25 NOTE — Progress Notes (Signed)
HPI  Pt presents to the clinic today with c/o cough and chest congestion x 3 weeks. The worst part is the productive cough. He does produce yellow sputum. He denies fever, chills or body aches. He has had some associated nausea but no vomiting. He has tried Robitussin, Mucinex, cough drops and nothing seems to help. The cough is worse at night. He has not had much sleep in 3 nights. He does have a history of interstitial lung disease and CHF. He does have sick contacts.  Review of Systems      Past Medical History  Diagnosis Date  . Atrial flutter     s/p ablation for RVR  03/2009, pradaxa ongoing  . VENOUS INSUFFICIENCY, LEFT LEG   . PULMONARY FIBROSIS, INTERSTITIAL   . OBSTRUCTIVE SLEEP APNEA   . BENIGN POSITIONAL VERTIGO   . HYPERGLYCEMIA 2011  . Interstitial lung disease   . ALLERGIC RHINITIS   . Rheumatoid arthritis(714.0)   . Chronic diastolic CHF (congestive heart failure)   . PERIPHERAL NEUROPATHY   . HYPERTENSION   . HYPERLIPIDEMIA   . DEPRESSION     [facility] hosp 01/2011 for SI  . Anxiety   . Morbid obesity     Family History  Problem Relation Age of Onset  . Heart attack Father 21  . Other Mother 72    Old age    History   Social History  . Marital Status: Divorced    Spouse Name: N/A    Number of Children: N/A  . Years of Education: N/A   Occupational History  . Not on file.   Social History Main Topics  . Smoking status: Former Smoker -- 1.00 packs/day for 3 years    Types: Cigarettes    Quit date: 01/20/2000  . Smokeless tobacco: Never Used     Comment: Single, lives alone. 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 Alcohol Use: No     Comment: Former  . Drug Use: No  . Sexually Active: Not Currently   Other Topics Concern  . Not on file   Social History Narrative  . No narrative on file    Allergies  Allergen Reactions  . Methotrexate     REACTION: ONLY  HAS ONE KIDNEY     Constitutional: Positive headache, fatigue. Denies fever or abrupt weight changes.  HEENT:  Positive sore throat. Denies eye redness, eye pain, pressure behind the eyes, facial pain, nasal congestion, ear pain, ringing in the ears, wax buildup, runny nose or bloody nose. Respiratory: Positive cough. Denies difficulty breathing or shortness of breath.  Cardiovascular: Denies chest pain, chest tightness, palpitations or swelling in the hands or feet.   No other specific complaints in a complete review of systems (except as listed in HPI above).  Objective:   BP 126/60  Pulse 73  Temp(Src) 98.2 F (36.8 C) (Oral)  Resp 16  Wt 215 lb 0.6 oz (97.542 kg)  BMI 32.7 kg/m2  SpO2 96% Wt Readings from Last 3 Encounters:  07/25/12 215 lb 0.6 oz (97.542 kg)  06/01/12 209 lb 12.8 oz (95.165 kg)  02/29/12 225 lb 6.4 oz (102.241 kg)     General: Appears his stated age, well developed, well nourished in NAD. HEENT: Head: normal shape and size; Eyes: sclera white, no icterus, conjunctiva pink, PERRLA and EOMs intact; Ears: Tm's gray and intact, normal light reflex; Nose: mucosa pink and  moist, septum midline; Throat/Mouth: + PND. Teeth present, mucosa erythematous and moist, no exudate noted, no lesions or ulcerations noted.  Neck: Mild cervical lymphadenopathy. Neck supple, trachea midline. No massses, lumps or thyromegaly present.  Cardiovascular: Normal rate and rhythm. S1,S2 noted.  No murmur, rubs or gallops noted. No JVD or BLE edema. No carotid bruits noted. Pulmonary/Chest: Normal effort and positive vesicular breath sounds. No respiratory distress. No wheezes, rales or ronchi noted.      Assessment & Plan:   Acute bronchitis, new onset with additional workup required:  Get some rest and drink plenty of water Do salt water gargles for the sore throat eRx for Azithromax x 5 days eRx for Hycodan cough syrup  RTC as needed or if symptoms persist.

## 2012-07-25 NOTE — Patient Instructions (Signed)

## 2012-07-26 ENCOUNTER — Other Ambulatory Visit: Payer: Self-pay | Admitting: *Deleted

## 2012-07-26 MED ORDER — GABAPENTIN 300 MG PO CAPS: ORAL_CAPSULE | ORAL | Status: DC

## 2012-08-04 ENCOUNTER — Telehealth: Payer: Self-pay

## 2012-08-04 DIAGNOSIS — R05 Cough: Secondary | ICD-10-CM

## 2012-08-04 DIAGNOSIS — J209 Acute bronchitis, unspecified: Secondary | ICD-10-CM

## 2012-08-04 MED ORDER — AZITHROMYCIN 250 MG PO TABS: ORAL_TABLET | ORAL | Status: DC

## 2012-08-04 NOTE — Telephone Encounter (Signed)
Ok to refill zpack

## 2012-08-04 NOTE — Telephone Encounter (Signed)
Pt last saw regina 07/25/12. pls advise...lmb

## 2012-08-04 NOTE — Telephone Encounter (Signed)
Pt called [facility] stating that he was seen recently, given abx and after about 10 days he c/o continued cough. Per pt he generally has to have 2 rounds of abx to help. Please advise Thanks

## 2012-08-09 ENCOUNTER — Other Ambulatory Visit (HOSPITAL_COMMUNITY): Payer: Self-pay | Admitting: *Deleted

## 2012-08-10 ENCOUNTER — Encounter (HOSPITAL_COMMUNITY)
Admission: RE | Admit: 2012-08-10 | Discharge: 2012-08-10 | Disposition: A | Discharge status: Home / Self Care | Payer: Medicare Other | Source: Ambulatory Visit | Attending: Rheumatology | Admitting: Rheumatology

## 2012-08-10 MED ORDER — SODIUM CHLORIDE 0.9 % IV SOLN: INTRAVENOUS | Status: DC

## 2012-08-10 MED ORDER — METHYLPREDNISOLONE SODIUM SUCC 125 MG IJ SOLR: 100.0000 mg | INTRAMUSCULAR | Status: DC

## 2012-08-10 MED ORDER — SODIUM CHLORIDE 0.9 % IV SOLN
1000.0000 mg | INTRAVENOUS | Status: DC
  Filled 2012-08-10: qty 100

## 2012-08-30 ENCOUNTER — Other Ambulatory Visit (HOSPITAL_COMMUNITY): Payer: Self-pay | Admitting: *Deleted

## 2012-08-31 ENCOUNTER — Encounter (HOSPITAL_COMMUNITY)
Admission: RE | Admit: 2012-08-31 | Discharge: 2012-08-31 | Disposition: A | Discharge status: Home / Self Care | Payer: Medicare Other | Source: Ambulatory Visit | Attending: Rheumatology | Admitting: Rheumatology

## 2012-08-31 DIAGNOSIS — M069 Rheumatoid arthritis, unspecified: Secondary | ICD-10-CM | POA: Insufficient documentation

## 2012-08-31 MED ORDER — SODIUM CHLORIDE 0.9 % IV SOLN
1000.0000 mg | Freq: Once | INTRAVENOUS | Status: AC
  Administered 2012-08-31: 1000 mg via INTRAVENOUS
  Filled 2012-08-31: qty 100

## 2012-08-31 MED ORDER — METHYLPREDNISOLONE SODIUM SUCC 125 MG IJ SOLR
INTRAMUSCULAR | Status: AC
  Filled 2012-08-31: qty 2

## 2012-08-31 MED ORDER — METHYLPREDNISOLONE SODIUM SUCC 125 MG IJ SOLR
100.0000 mg | Freq: Once | INTRAMUSCULAR | Status: AC
  Administered 2012-08-31: 100 mg via INTRAVENOUS

## 2012-08-31 MED ORDER — SODIUM CHLORIDE 0.9 % IV SOLN
Freq: Once | INTRAVENOUS | Status: AC
  Administered 2012-08-31: 10:00:00 via INTRAVENOUS

## 2012-09-14 ENCOUNTER — Other Ambulatory Visit (HOSPITAL_COMMUNITY): Payer: Self-pay | Admitting: *Deleted

## 2012-09-15 ENCOUNTER — Other Ambulatory Visit: Payer: Self-pay | Admitting: *Deleted

## 2012-09-15 ENCOUNTER — Encounter (HOSPITAL_COMMUNITY): Payer: Self-pay

## 2012-09-15 MED ORDER — MEMANTINE HCL 10 MG PO TABS: ORAL_TABLET | ORAL | Status: DC

## 2012-09-21 ENCOUNTER — Ambulatory Visit (INDEPENDENT_AMBULATORY_CARE_PROVIDER_SITE_OTHER)
Admission: RE | Admit: 2012-09-21 | Discharge: 2012-09-21 | Disposition: A | Discharge status: Home / Self Care | Payer: Medicare Other | Source: Ambulatory Visit | Attending: Internal Medicine | Admitting: Internal Medicine

## 2012-09-21 ENCOUNTER — Other Ambulatory Visit (INDEPENDENT_AMBULATORY_CARE_PROVIDER_SITE_OTHER): Payer: Medicare Other

## 2012-09-21 ENCOUNTER — Ambulatory Visit (INDEPENDENT_AMBULATORY_CARE_PROVIDER_SITE_OTHER): Payer: Medicare Other | Admitting: Internal Medicine

## 2012-09-21 ENCOUNTER — Encounter: Payer: Self-pay | Admitting: Internal Medicine

## 2012-09-21 VITALS — BP 132/60 | HR 64 | Temp 98.9°F | Wt 223.1 lb

## 2012-09-21 DIAGNOSIS — J84111 Idiopathic interstitial pneumonia, not otherwise specified: Secondary | ICD-10-CM

## 2012-09-21 DIAGNOSIS — R7309 Other abnormal glucose: Secondary | ICD-10-CM

## 2012-09-21 DIAGNOSIS — I10 Essential (primary) hypertension: Secondary | ICD-10-CM

## 2012-09-21 DIAGNOSIS — J42 Unspecified chronic bronchitis: Secondary | ICD-10-CM

## 2012-09-21 DIAGNOSIS — J209 Acute bronchitis, unspecified: Secondary | ICD-10-CM

## 2012-09-21 LAB — BASIC METABOLIC PANEL
CO2: 27 mEq/L (ref 19–32)
Chloride: 102 mEq/L (ref 96–112)
Glucose, Bld: 128 mg/dL — ABNORMAL HIGH (ref 70–99)
Potassium: 3.8 mEq/L (ref 3.5–5.1)
Sodium: 137 mEq/L (ref 135–145)

## 2012-09-21 LAB — LIPID PANEL
HDL: 27.2 mg/dL — ABNORMAL LOW (ref >39.00)
LDL Cholesterol: 122 mg/dL — ABNORMAL HIGH (ref 0–99)
Total CHOL/HDL Ratio: 6
VLDL: 21.4 mg/dL (ref 0.0–40.0)

## 2012-09-21 MED ORDER — LEVOFLOXACIN 500 MG PO TABS: 500.0000 mg | ORAL_TABLET | Freq: Every day | ORAL | Status: DC

## 2012-09-21 NOTE — Assessment & Plan Note (Signed)
Random hyperglycemia on labs with cards fall 2011 -  previously on low dose chronic pred for RA check a1c q6-31mo due to FH DM but pt working on weight control to manage same Lab Results  Component Value Date   HGBA1C 5.9 05/18/2011

## 2012-09-21 NOTE — Assessment & Plan Note (Signed)
Prev followed with pulmonary for same, not seen since 2012 Idiopathic or amio component implicated  PFTs ordered August 2012 never performed; will defer to pulmonary if need to repeat at this time The current medical regimen is effective;  continue present plan and medications.

## 2012-09-21 NOTE — Patient Instructions (Signed)
It was good to see you today. Test(s) ordered today. Your results will be called to you after review (48-72hours after test completion). If any changes need to be made, you will be notified at that time. Levaquin antibiotics - Your prescription(s) and refills have been submitted to your pharmacy. Please take as directed and contact our office if you believe you are having problem(s) with the medication(s). Other Medications reviewed and updated, no changes at this time. we'll make referral to lung specialist for follow up . Our office will contact you regarding appointment(s) once made. Please schedule followup in 4 months, call sooner if problems.

## 2012-09-21 NOTE — Progress Notes (Signed)
Subjective:    Patient ID: [name], male    DOB: 1931/01/26, 79 y.o.   MRN: [id number]  HPI Here for chest congestion - recent episode of same 2 months ago, never really cleared despite Zpak x 2 courses- cough with yellow sputum and  dyspnea on exertion   Also reviewed chronic medical issues:  chronic depression/anxiety symptoms hosp at [facility] 01/2011 and 08/2010 for same Last med changes reviewed - initially less anxious with ativan Changed to Valium early 04/2011 for treatment of same with flare of BPPV Follows with [facility] q2-4 weeks for counseling  pulm fibrosis - idopathic and ?amio component - not seen by pulm since 2012 for same - reports compliance with meds - no O2 needs   A fib/flutter - hosp with same and [facility] CHF 10/2010 - s/p cardioversion and ablation 03/2009 = sinus maintained, off amio due to above - denies palpitations or edema changes - compliants with meds as rx'd and follows with dr. [place] - resumed pradaxa 03/2011   RA - follows with rheum for same - on arava since start of 2011, denies side effects from same, pain and mobility much improved - uses hydrocodone as needed (from rheum)  OSA - intermittent CPAP use - believes weight loss which has occured since 03/2009 hosp has helped his OSA symptoms so CPAP "not necessary most of the time"   chronic neuropathic pain - burning and numbness affects hands and feet, no recent change in symptoms - controlled with gabapentin -   Past Medical History  Diagnosis Date  . Atrial flutter     s/p ablation for RVR  03/2009, pradaxa ongoing  . VENOUS INSUFFICIENCY, LEFT LEG   . PULMONARY FIBROSIS, INTERSTITIAL   . OBSTRUCTIVE SLEEP APNEA   . BENIGN POSITIONAL VERTIGO   . HYPERGLYCEMIA 2011  . Interstitial lung disease   . ALLERGIC RHINITIS   . Rheumatoid arthritis(714.0)   . Chronic diastolic CHF (congestive heart failure)   . PERIPHERAL NEUROPATHY   . HYPERTENSION   . HYPERLIPIDEMIA   . DEPRESSION     [facility] hosp 01/2011 for SI   . Anxiety   . Morbid obesity     Review of Systems  Constitutional: Positive for fatigue. Negative for fever and unexpected weight change.  Respiratory: Positive for cough and shortness of breath.   Cardiovascular: Negative for chest pain, palpitations and leg swelling.  Neurological: Negative for light-headedness and headaches.       Objective:   Physical Exam BP 132/60  Pulse 64  Temp(Src) 98.9 F (37.2 C) (Oral)  Wt 223 lb 1.9 oz (101.207 kg)  BMI 33.93 kg/m2  SpO2 95% Wt Readings from Last 3 Encounters:  09/21/12 223 lb 1.9 oz (101.207 kg)  08/31/12 215 lb (97.523 kg)  07/25/12 215 lb 0.6 oz (97.542 kg)   Constitutional:  He appears well-developed and well-nourished. No distress. Resting lip smacking. HENT: NCAT, sinuses non tender, TMs hazy but no redness or effusion - no cerumen - OP clear Neck: Normal range of motion. Neck supple. No JVD present. No thyromegaly present.  Cardiovascular: Normal rate, regular rhythm and normal heart sounds.  No murmur heard. no BLE edema Pulmonary/Chest: Effort normal and breath sounds with bibasilar rhonchi. No respiratory distress. no wheezes.  Psychiatric: he has an anxious and dysphoric mood and affect. Perseverating, but behavior is normal. Judgment and thought content normal.   Lab Results  Component Value Date   WBC 9.5 11/04/2011   HGB 12.0* 11/04/2011  HCT 37.2* 11/04/2011   PLT 280.0 11/04/2011   GLUCOSE 95 06/01/2012   ALT 30 10/01/2010   AST 18 10/01/2010   NA 140 06/01/2012   K 4.5 06/01/2012   CL 102 06/01/2012   CREATININE 1.1 06/01/2012   BUN 16 06/01/2012   CO2 31 06/01/2012   TSH 2.74 11/04/2011   PSA 5.04* 09/26/2010   INR 1.14 09/19/2010   HGBA1C 5.9 05/18/2011       Assessment & Plan:   See problem list. Medications and labs reviewed today.  Acute exacerbation of underlying dz -  hx pulm fibrosis  Check CXR tx with Levaquin x 10d Arrange follow up with pulm

## 2012-09-21 NOTE — Assessment & Plan Note (Signed)
BP Readings from Last 3 Encounters:  09/21/12 132/60  08/31/12 177/65  08/10/12 136/59   The current medical regimen is effective;  continue present plan and medications.

## 2012-09-23 ENCOUNTER — Institutional Professional Consult (permissible substitution): Payer: Self-pay | Admitting: Pulmonary Disease

## 2012-09-29 ENCOUNTER — Institutional Professional Consult (permissible substitution): Payer: Self-pay | Admitting: Pulmonary Disease

## 2012-09-30 ENCOUNTER — Encounter: Payer: Self-pay | Admitting: Pulmonary Disease

## 2012-09-30 ENCOUNTER — Other Ambulatory Visit (INDEPENDENT_AMBULATORY_CARE_PROVIDER_SITE_OTHER): Payer: Medicare Other

## 2012-09-30 ENCOUNTER — Ambulatory Visit (INDEPENDENT_AMBULATORY_CARE_PROVIDER_SITE_OTHER)
Admission: RE | Admit: 2012-09-30 | Discharge: 2012-09-30 | Disposition: A | Discharge status: Home / Self Care | Payer: Medicare Other | Source: Ambulatory Visit | Attending: Pulmonary Disease | Admitting: Pulmonary Disease

## 2012-09-30 ENCOUNTER — Ambulatory Visit (INDEPENDENT_AMBULATORY_CARE_PROVIDER_SITE_OTHER): Payer: Medicare Other | Admitting: Pulmonary Disease

## 2012-09-30 VITALS — BP 128/72 | HR 69 | Temp 99.6°F | Ht 68.0 in | Wt 225.8 lb

## 2012-09-30 DIAGNOSIS — R0600 Dyspnea, unspecified: Secondary | ICD-10-CM

## 2012-09-30 DIAGNOSIS — R0609 Other forms of dyspnea: Secondary | ICD-10-CM

## 2012-09-30 DIAGNOSIS — J84111 Idiopathic interstitial pneumonia, not otherwise specified: Secondary | ICD-10-CM

## 2012-09-30 DIAGNOSIS — I5032 Chronic diastolic (congestive) heart failure: Secondary | ICD-10-CM

## 2012-09-30 DIAGNOSIS — I509 Heart failure, unspecified: Secondary | ICD-10-CM

## 2012-09-30 DIAGNOSIS — I5022 Chronic systolic (congestive) heart failure: Secondary | ICD-10-CM

## 2012-09-30 DIAGNOSIS — R1310 Dysphagia, unspecified: Secondary | ICD-10-CM | POA: Insufficient documentation

## 2012-09-30 LAB — SEDIMENTATION RATE: Sed Rate: 35 mm/hr — ABNORMAL HIGH (ref 0–22)

## 2012-09-30 MED ORDER — PREDNISONE 10 MG PO TABS: ORAL_TABLET | ORAL | Status: DC

## 2012-09-30 MED ORDER — OMEPRAZOLE 20 MG PO CPDR: 20.0000 mg | DELAYED_RELEASE_CAPSULE | Freq: Every day | ORAL | Status: DC

## 2012-09-30 NOTE — Assessment & Plan Note (Addendum)
Barium swallow Omeprazole empiric for GERD contribution to cough

## 2012-09-30 NOTE — Progress Notes (Signed)
  Subjective:    Patient ID: [name], male    DOB: 1931-08-26, 79 y.o.   MRN: [id number]  HPI  PCP - Felicity Coyer  Rheum - Durenda Age  Cards- [place]   79 year old male with pulmonary Fibrosis due to RA and OSA , CHF with poor EF.  Was admited 3/ 2011 with A Flutter/Fib and CHF exacerbation and needed ICU stay and amiodarone. CT showed evidence of diffuse pulmonary fibrosis. He was cardioverted and ablated with retun to sinus. Cath 04/06/2009 was normal coronaries.  PSG 8/11 showed - moderate obstructive sleep apnea AHI 22/h, lowest desatn 78% - corrected by cpaP 9 cm with med FF mask  ? persistent REM related desaturation on 9 cm related to underlying cardiopulmonary disease  November 11, 2009  Reviewde oNO on CPAP - destauration x 9 mins only over total recording time x 10 h, PFTs showed mild restriction, no obstruction   09/30/2012  last seen 08/25/2010. Pt c/o chest congestion, cough w/ yellow phlem, wheezing, chest tx, nasal congestion,PND. z-pak x 2 , then levaquin  CXR 09/21/12 Worsening coarsened interstitial opacities throughout the lungs Now on Ritizan q , on arava since start of 2011, previously on low dose chronic pred ,mobility much improved - uses hydrocodone as needed (from rheum) Takes 80 lasix am & 40 pm- renal fn OK  Not on amiodarone anymore Did not desatn on exertion  COmpliant with CPAP Mask ok, pressure ok, no dryness or headaches or discharge   Past Medical History  Diagnosis Date  . Atrial flutter     s/p ablation for RVR  03/2009, pradaxa ongoing  . VENOUS INSUFFICIENCY, LEFT LEG   . PULMONARY FIBROSIS, INTERSTITIAL   . OBSTRUCTIVE SLEEP APNEA   . BENIGN POSITIONAL VERTIGO   . HYPERGLYCEMIA 2011  . Interstitial lung disease   . ALLERGIC RHINITIS   . Rheumatoid arthritis(714.0)   . Chronic diastolic CHF (congestive heart failure)   . PERIPHERAL NEUROPATHY   . HYPERTENSION   . HYPERLIPIDEMIA   . DEPRESSION     [facility] hosp 01/2011 for SI  . Anxiety   .  Morbid obesity     Review of Systems  neg for any significant sore throat, dysphagia, itching, sneezing, nasal congestion or excess/ purulent secretions, fever, chills, sweats, unintended wt loss, pleuritic or exertional cp, hempoptysis, orthopnea pnd or change in chronic leg swelling. Also denies presyncope, palpitations, heartburn, abdominal pain, nausea, vomiting, diarrhea or change in bowel or urinary habits, dysuria,hematuria, rash, arthralgias, visual complaints, headache, numbness weakness or ataxia.     Objective:   Physical Exam  Gen. Pleasant, well-nourished, in no distress, normal affect ENT - no lesions, no post nasal drip Neck: No JVD, no thyromegaly, no carotid bruits Lungs: no use of accessory muscles, no dullness to percussion, bibasal 1/2 rales, no rhonchi  Cardiovascular: Rhythm regular, heart sounds  normal, no murmurs or gallops, no peripheral edema Abdomen: soft and non-tender, no hepatosplenomegaly, BS normal. Musculoskeletal: No deformities, no cyanosis or clubbing Neuro:  alert, non focal        Assessment & Plan:

## 2012-09-30 NOTE — Assessment & Plan Note (Signed)
BNp  Does not appear to be in overt failure

## 2012-09-30 NOTE — Assessment & Plan Note (Addendum)
Your pulmonary fibrosis seems to have gotten worse - Ifindeed so, may have to consider alternatives to TNF agents Take DELSYm cough syrup 5ml  twice daily Blood work today Ct scan of chest  Breathing test Prednisone 10 mg tabs  Take 2 tabs daily with food x 5ds, then 1 tab daily with food x 5ds then STOP

## 2012-09-30 NOTE — Patient Instructions (Addendum)
Your pulmonary fibrosis seems to have gotten worse Take DELSYm cough syrup 5ml  twice daily Blood work today Ct scan of chest  Breathing test Prednisone 10 mg tabs  Take 2 tabs daily with food x 7ds, then 1 tab daily with food Swallowing test Take omeprazole for reflux

## 2012-10-03 ENCOUNTER — Ambulatory Visit: Payer: Self-pay | Admitting: Cardiology

## 2012-10-04 ENCOUNTER — Inpatient Hospital Stay (HOSPITAL_COMMUNITY): Admission: RE | Admit: 2012-10-04 | Payer: Self-pay | Source: Ambulatory Visit

## 2012-10-17 ENCOUNTER — Encounter: Payer: Self-pay | Admitting: Pulmonary Disease

## 2012-10-17 ENCOUNTER — Ambulatory Visit (INDEPENDENT_AMBULATORY_CARE_PROVIDER_SITE_OTHER): Payer: Medicare Other | Admitting: Pulmonary Disease

## 2012-10-17 ENCOUNTER — Ambulatory Visit (HOSPITAL_COMMUNITY)
Admission: RE | Admit: 2012-10-17 | Discharge: 2012-10-17 | Disposition: A | Discharge status: Home / Self Care | Payer: Medicare Other | Source: Ambulatory Visit | Attending: Pulmonary Disease | Admitting: Pulmonary Disease

## 2012-10-17 VITALS — BP 124/76 | HR 88 | Temp 98.1°F | Ht 68.0 in | Wt 215.0 lb

## 2012-10-17 DIAGNOSIS — R0989 Other specified symptoms and signs involving the circulatory and respiratory systems: Secondary | ICD-10-CM | POA: Insufficient documentation

## 2012-10-17 DIAGNOSIS — R0609 Other forms of dyspnea: Secondary | ICD-10-CM | POA: Insufficient documentation

## 2012-10-17 DIAGNOSIS — J84111 Idiopathic interstitial pneumonia, not otherwise specified: Secondary | ICD-10-CM

## 2012-10-17 DIAGNOSIS — Z23 Encounter for immunization: Secondary | ICD-10-CM

## 2012-10-17 LAB — PULMONARY FUNCTION TEST

## 2012-10-17 MED ORDER — ALBUTEROL SULFATE (5 MG/ML) 0.5% IN NEBU
2.5000 mg | INHALATION_SOLUTION | Freq: Once | RESPIRATORY_TRACT | Status: AC
  Administered 2012-10-17: 2.5 mg via RESPIRATORY_TRACT

## 2012-10-17 NOTE — Assessment & Plan Note (Signed)
May be IPF - RA appears quiescent clinically, also CCP neg DLCO is at 50% -establishes baseline, will rpt in CT does appear worse from 2011  so assume progressive disease Take prednisone every other day , then stop Proceed with swallowing test Flu shot

## 2012-10-17 NOTE — Progress Notes (Signed)
  Subjective:    Patient ID: [name], male    DOB: 11/11/1931, 78 y.o.   MRN: [id number]  HPI  PCP - Felicity Coyer  Rheum - Durenda Age  Cards- [place]   79 year old male with pulmonary Fibrosis, RA and OSA , CHF with poor EF.  Was admited 3/ 2011 with A Flutter/Fib and CHF exacerbation and needed ICU stay and amiodarone. CT showed evidence of diffuse pulmonary fibrosis. He was cardioverted and ablated with retun to sinus. Cath 04/06/2009 was normal coronaries.  PSG 8/11 showed - moderate obstructive sleep apnea AHI 22/h, lowest desatn 78% - corrected by cpaP 9 cm with med FF mask  ? persistent REM related desaturation on 9 cm related to underlying cardiopulmonary disease  October, 2011 ONO on CPAP - destauration x 9 mins only over total recording time x 10 h,  PFTs showed mild restriction, no obstruction    10/17/2012 Returned after 2 y with worse chest congestion, cough w/ yellow phlem - z-pak x 2 , then levaquin  CXR 09/21/12 Worsening coarsened interstitial opacities throughout the lungs  Now on Ritizan q , on arava since start of 2011, previously on low dose chronic pred ,mobility much improved - uses hydrocodone as needed (from rheum)  Takes 80 lasix am & 40 pm- renal fn OK  Not on amiodarone anymore  Did not desatn on exertion  COmpliant with CPAP -Mask ok, pressure ok  BNP 172, ESR 35, RA 38, CCP neg  HRCT >> Chronic interstitial lung disease with mildly progressed peripheral reticulation and honeycombing in some areas since 2011. Dysphagia better - did not get ba swallow Improved with prednisone -short taper  Review of Systems neg for any significant sore throat, dysphagia, itching, sneezing, nasal congestion or excess/ purulent secretions, fever, chills, sweats, unintended wt loss, pleuritic or exertional cp, hempoptysis, orthopnea pnd or change in chronic leg swelling. Also denies presyncope, palpitations, heartburn, abdominal pain, nausea, vomiting, diarrhea or change in  bowel or urinary habits, dysuria,hematuria, rash, arthralgias, visual complaints, headache, numbness weakness or ataxia.     Objective:   Physical Exam  Gen. Pleasant, well-nourished, in no distress ENT - no lesions, no post nasal drip Neck: No JVD, no thyromegaly, no carotid bruits Lungs: no use of accessory muscles, no dullness to percussion, bibasal 1/3 rales, no rhonchi  Cardiovascular: Rhythm regular, heart sounds  normal, no murmurs or gallops, no peripheral edema Musculoskeletal: No deformities, no cyanosis or clubbing        Assessment & Plan:

## 2012-10-17 NOTE — Patient Instructions (Addendum)
You have pulmonary fibrosis  Lung function is at 50% - repeat in Take prednisone every other day , then stop Proceed with swallowing test Flu shot

## 2012-10-20 ENCOUNTER — Ambulatory Visit (HOSPITAL_COMMUNITY)
Admission: RE | Admit: 2012-10-20 | Discharge: 2012-10-20 | Disposition: A | Discharge status: Home / Self Care | Payer: Medicare Other | Source: Ambulatory Visit | Attending: Pulmonary Disease | Admitting: Pulmonary Disease

## 2012-10-20 DIAGNOSIS — R05 Cough: Secondary | ICD-10-CM | POA: Diagnosis not present

## 2012-10-20 DIAGNOSIS — K209 Esophagitis, unspecified without bleeding: Secondary | ICD-10-CM | POA: Insufficient documentation

## 2012-10-20 DIAGNOSIS — R1310 Dysphagia, unspecified: Secondary | ICD-10-CM | POA: Diagnosis present

## 2012-10-20 DIAGNOSIS — R059 Cough, unspecified: Secondary | ICD-10-CM | POA: Insufficient documentation

## 2012-10-20 DIAGNOSIS — K224 Dyskinesia of esophagus: Secondary | ICD-10-CM | POA: Diagnosis not present

## 2012-10-20 DIAGNOSIS — J84111 Idiopathic interstitial pneumonia, not otherwise specified: Secondary | ICD-10-CM

## 2012-10-20 DIAGNOSIS — K222 Esophageal obstruction: Secondary | ICD-10-CM | POA: Insufficient documentation

## 2012-10-20 MED ORDER — IOHEXOL 300 MG/ML  SOLN
150.0000 mL | Freq: Once | INTRAMUSCULAR | Status: AC | PRN
  Administered 2012-10-20: 115 mL via ORAL

## 2012-10-26 ENCOUNTER — Other Ambulatory Visit: Payer: Self-pay | Admitting: *Deleted

## 2012-10-26 MED ORDER — GABAPENTIN 300 MG PO CAPS: ORAL_CAPSULE | ORAL | Status: DC

## 2012-11-07 ENCOUNTER — Encounter: Payer: Self-pay | Admitting: Adult Health

## 2012-11-07 ENCOUNTER — Ambulatory Visit (INDEPENDENT_AMBULATORY_CARE_PROVIDER_SITE_OTHER): Payer: Medicare Other | Admitting: Adult Health

## 2012-11-07 ENCOUNTER — Encounter: Payer: Self-pay | Admitting: Gastroenterology

## 2012-11-07 VITALS — BP 104/72 | HR 62 | Temp 97.5°F | Ht 68.0 in | Wt 218.8 lb

## 2012-11-07 DIAGNOSIS — R1310 Dysphagia, unspecified: Secondary | ICD-10-CM

## 2012-11-07 DIAGNOSIS — J84111 Idiopathic interstitial pneumonia, not otherwise specified: Secondary | ICD-10-CM

## 2012-11-07 NOTE — Assessment & Plan Note (Signed)
Pulmonary fibrosis. No flare in symptoms off and steroids. Patient continue on his current regimen. Followup with Dr. [name] in 2 months and as needed

## 2012-11-07 NOTE — Assessment & Plan Note (Signed)
Abnormal barium swallow  Showed esophagitis, barium tablet lodged, diffuse esophageal motility disorder with last laryngeal penetration with swallowing Patient has been advised to restart his Prilosec. Will increase dose to twice daily.  Advised GERD diet Advised on swallow precautions Patient referred to gastroenterology for evaluation

## 2012-11-07 NOTE — Progress Notes (Signed)
  Subjective:    Patient ID: [name], male    DOB: [birth year], 79 y.o.   MRN: [id number]  HPI PCP - Felicity Coyer  Rheum - Durenda Age  Cards- [place]   79 year old male with pulmonary Fibrosis, RA and OSA , CHF with poor EF.  Was admited 3/ 2011 with A Flutter/Fib and CHF exacerbation and needed ICU stay and amiodarone. CT showed evidence of diffuse pulmonary fibrosis. He was cardioverted and ablated with retun to sinus. Cath 04/06/2009 was normal coronaries.  PSG 8/11 showed - moderate obstructive sleep apnea AHI 22/h, lowest desatn 78% - corrected by cpaP 9 cm with med FF mask  ? persistent REM related desaturation on 9 cm related to underlying cardiopulmonary disease  October, 2011 ONO on CPAP - destauration x 9 mins only over total recording time x 10 h,  PFTs showed mild restriction, no obstruction    10/17/12  Returned after 2 y with worse chest congestion, cough w/ yellow phlem - z-pak x 2 , then levaquin  CXR 09/21/12 Worsening coarsened interstitial opacities throughout the lungs  Now on Ritizan q , on arava since start of 2011, previously on low dose chronic pred ,mobility much improved - uses hydrocodone as needed (from rheum)  Takes 80 lasix am & 40 pm- renal fn OK  Not on amiodarone anymore  Did not desatn on exertion  COmpliant with CPAP -Mask ok, pressure ok  BNP 172, ESR 35, RA 38, CCP neg  HRCT >> Chronic interstitial lung disease with mildly progressed peripheral reticulation and honeycombing in some areas since 2011. Dysphagia better - did not get ba swallow Improved with prednisone -short taper >>tapered off steroids   11/07/2012 Follow up Pulmonary Fibrosis Returns for 3 week follow up. Last ov was tapered off steroids slowly . Pt says he is off prednisone, no flare in cough or wheezing. Did undergo Barium Swallow that showed esophagitis, barium tab lodged, diffuse esophageal motility disorder w/ flash laryngeal penetration w/ swallowing.  Is off his PPI, has  noticed reflux more.  Patient denies any chest pain, orthopnea, PND, leg swelling , or hemoptysis.  Wears CPAP all night, no mask issues.       Review of Systems  neg for any significant sore throat, dysphagia, itching, sneezing, nasal congestion or excess/ purulent secretions, fever, chills, sweats, unintended wt loss, pleuritic or exertional cp, hempoptysis, orthopnea pnd or change in chronic leg swelling. Also denies presyncope, palpitations, heartburn, abdominal pain, nausea, vomiting, diarrhea or change in bowel or urinary habits, dysuria,hematuria, rash, arthralgias, visual complaints, headache, numbness weakness or ataxia.     Objective:   Physical Exam   Gen. Pleasant, well-nourished, in no distress ENT - no lesions, no post nasal drip Neck: No JVD, no thyromegaly, no carotid bruits Lungs: no use of accessory muscles, no dullness to percussion, bibasal 1/3 rales, no rhonchi  Cardiovascular: Rhythm regular, heart sounds  normal, no murmurs or gallops, no peripheral edema Musculoskeletal: No deformities, no cyanosis or clubbing        Assessment & Plan:

## 2012-11-07 NOTE — Patient Instructions (Signed)
Restart Omeprazole  20mg  Twice daily  -this is an increased dose.  GERD diet  Swallow precautions  Refer to Gastroenterology for swallow issues.  follow up Dr. [name]  In 2-3 months and As needed

## 2012-11-08 ENCOUNTER — Telehealth: Payer: Self-pay | Admitting: Pulmonary Disease

## 2012-11-08 MED ORDER — OMEPRAZOLE 20 MG PO CPDR: 20.0000 mg | DELAYED_RELEASE_CAPSULE | Freq: Two times a day (BID) | ORAL | Status: DC

## 2012-11-08 NOTE — Telephone Encounter (Signed)
Pt's instructions from yesterday's OV with TP:  Patient Instructions    Restart Omeprazole 20mg  Twice daily -this is an increased dose.  GERD diet  Swallow precautions  Refer to Gastroenterology for swallow issues.  follow up Dr. [name] In 2-3 months and As needed    -----  I called Pharm, spoke with Rosanne Ashing.  Gave VO to increased omeprazole 20 mg to bid.  He verbalized understanding. [email or web address] for pt to inform this has been taken care of as he has already left the pharm.

## 2012-11-09 NOTE — Telephone Encounter (Signed)
LMOM for pt that rx for Omeprazole was clarified with pharmacist at [facility].

## 2012-11-14 ENCOUNTER — Encounter: Payer: Self-pay | Admitting: *Deleted

## 2012-11-18 ENCOUNTER — Ambulatory Visit: Payer: Self-pay | Admitting: Gastroenterology

## 2012-12-01 ENCOUNTER — Ambulatory Visit: Payer: Self-pay | Admitting: Cardiology

## 2012-12-02 ENCOUNTER — Encounter: Payer: Self-pay | Admitting: Cardiology

## 2012-12-02 ENCOUNTER — Ambulatory Visit (INDEPENDENT_AMBULATORY_CARE_PROVIDER_SITE_OTHER): Payer: Medicare Other | Admitting: Cardiology

## 2012-12-02 VITALS — BP 140/70 | HR 56 | Ht 68.0 in | Wt 216.4 lb

## 2012-12-02 DIAGNOSIS — I4891 Unspecified atrial fibrillation: Secondary | ICD-10-CM

## 2012-12-02 DIAGNOSIS — I509 Heart failure, unspecified: Secondary | ICD-10-CM

## 2012-12-02 DIAGNOSIS — I10 Essential (primary) hypertension: Secondary | ICD-10-CM

## 2012-12-02 DIAGNOSIS — I5032 Chronic diastolic (congestive) heart failure: Secondary | ICD-10-CM

## 2012-12-02 DIAGNOSIS — J84111 Idiopathic interstitial pneumonia, not otherwise specified: Secondary | ICD-10-CM

## 2012-12-02 DIAGNOSIS — I482 Chronic atrial fibrillation, unspecified: Secondary | ICD-10-CM

## 2012-12-02 MED ORDER — DIGOXIN 125 MCG PO TABS: 0.1250 mg | ORAL_TABLET | Freq: Every day | ORAL | Status: DC

## 2012-12-02 NOTE — Progress Notes (Signed)
[name] Date of Birth: Nov 23, 1931 Medical Record #[id number]  History of Present Illness: [name] is seen for a follow up visit. He has chronic atrial fib. Has had prior atrial flutter ablation. Remains on chronic anticoagulation with Pradaxa. He has a history of CHF related to tachycardia mediated CM and  diastolic heart failure. His other problems include venous insufficiency, interstitial pulmonary fibrosis, OSA, vertigo, RA, neuropathy, HTN, HLD, depression and anxiety.  On followup today he states he is doing well. He still has symptoms of fatigue and states he has persistent congestion in his lungs. He states his swelling is stable but he has gained 6 pounds. He still eats a high sodium diet including potato chips and processed meats and cheeses.  Current Outpatient Prescriptions on File Prior to Visit  Medication Sig Dispense Refill  . Ascorbic Acid (VITAMIN C) 500 MG tablet Take 500 mg by mouth daily.        . Cholecalciferol (VITAMIN D3) 1000 UNITS CAPS Take 2 capsules by mouth.       . Coral Calcium 1000 (390 CA) MG TABS Take 2 capsules by mouth.        . dabigatran (PRADAXA) 150 MG CAPS Take 1 capsule (150 mg total) by mouth every 12 (twelve) hours.  60 capsule  6  . diltiazem (CARDIZEM CD) 180 MG 24 hr capsule TAKE 1 CAPSULE BY MOUTH ONCE A DAY.  30 capsule  5  . Docusate Sodium 100 MG capsule Take 100 mg by mouth daily.      . folic acid (FOLVITE) 800 MCG tablet Take 1 tablet (800 mcg total) by mouth daily.  30 tablet  5  . furosemide (LASIX) 40 MG tablet Take 40 mg 2 tablets in am and 40 mg 1 in pm  90 tablet  6  . gabapentin (NEURONTIN) 300 MG capsule TAKE 2 CAPSULES BY MOUTH AT BEDTIME  60 capsule  5  . HYDROcodone-acetaminophen (VICODIN) 5-500 MG per tablet Take 1 tablet by mouth every 6 (six) hours as needed.      . leflunomide (ARAVA) 10 MG tablet Take 1 tablet (10 mg total) by mouth daily.      . Melatonin 1 MG CAPS Take 10 mg by mouth daily.       . memantine  (NAMENDA) 10 MG tablet TAKE (1) TABLET BY MOUTH ONCE DAILY.  30 tablet  3  . Multiple Vitamin (MULTIVITAMIN) tablet Take 1 tablet by mouth daily.        Marland Kitchen MYRBETRIQ 50 MG TB24 50 mg daily.       . NON FORMULARY at bedtime. c pap      . OMEGA-3 1000 MG CAPS Take by mouth daily.      Marland Kitchen omeprazole (PRILOSEC) 20 MG capsule Take 1 capsule (20 mg total) by mouth 2 (two) times daily.  60 capsule  3   No current facility-administered medications on file prior to visit.    Allergies  Allergen Reactions  . Methotrexate     REACTION: ONLY HAS ONE KIDNEY    Past Medical History  Diagnosis Date  . Atrial flutter     s/p ablation for RVR  03/2009, pradaxa ongoing  . VENOUS INSUFFICIENCY, LEFT LEG   . PULMONARY FIBROSIS, INTERSTITIAL   . OBSTRUCTIVE SLEEP APNEA   . BENIGN POSITIONAL VERTIGO   . HYPERGLYCEMIA 2011  . Interstitial lung disease   . ALLERGIC RHINITIS   . Rheumatoid arthritis(714.0)   . Chronic diastolic CHF (congestive heart failure)   .  PERIPHERAL NEUROPATHY   . HYPERTENSION   . HYPERLIPIDEMIA   . DEPRESSION     [facility] hosp 01/2011 for SI  . Anxiety   . Morbid obesity   . Diverticulosis of colon (without mention of hemorrhage)     Past Surgical History  Procedure Laterality Date  . 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 Shoulder surgery  09/2009  . Varicose vein surgery      Left Leg  . Radiofrequency ablation      History  Smoking status  . Former Smoker -- 1.00 packs/day for 3 years  . Types: Cigarettes  . Quit date: 01/20/2000  Smokeless tobacco  . Never Used    Comment: Single, lives alone. 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 His family contact would be his sister [name] [phone]    History  Alcohol Use No    Comment: Former    Family History  Problem Relation Age of Onset  . Heart attack Father 52  . Other Mother 73    Old age    Review of Systems: The  review of systems is per the HPI.  All other systems were reviewed and are negative.  Physical Exam: BP 140/70  Pulse 56  Ht 5\' 8"  (1.727 m)  Wt 216 lb 6.4 oz (98.158 kg)  BMI 32.91 kg/m2 Patient is very pleasant and in no acute distress. He is obese. Skin is warm and dry. Color is normal.  HEENT is unremarkable. Normocephalic/atraumatic. PERRL. Sclera are nonicteric. Neck is supple. No masses. No JVD. Lungs reveals few wheezes. Cardiac exam shows an irregular rhythm. Rate is controlled. Abdomen is obese but soft. Extremities show 1-2+ edema on the left and 1+ on the right. Gait and ROM are intact. No gross neurologic deficits noted.   LABORATORY DATA:  Lab Results  Component Value Date   WBC 9.5 11/04/2011   HGB 12.0* 11/04/2011   HCT 37.2* 11/04/2011   PLT 280.0 11/04/2011   GLUCOSE 128* 09/21/2012   CHOL 171 09/21/2012   TRIG 107.0 09/21/2012   HDL 27.20* 09/21/2012   LDLCALC 122* 09/21/2012   ALT 30 10/01/2010   AST 18 10/01/2010   NA 137 09/21/2012   K 3.8 09/21/2012   CL 102 09/21/2012   CREATININE 1.0 09/21/2012   BUN 15 09/21/2012   CO2 27 09/21/2012   TSH 2.74 11/04/2011   PSA 5.04* 09/26/2010   INR 1.14 09/19/2010   HGBA1C 6.6* 09/21/2012     Assessment / Plan:  1. Chronic diastolic heart failure -  Have stressed the importance of sodium restriction. We'll continue with his current diuretic dose. I'll followup again in 6 months.  2. Chronic atrial fib - rate is controlled on digoxin and diltiazem. I recommended reducing his digoxin to 0.125 mg daily  3. Chronic anticoagulation - on Pradaxa - no problems noted.   4. Interstitial lung disease/fibrosis - seems to be at his baseline.

## 2012-12-02 NOTE — Patient Instructions (Signed)
We will reduce Lanoxin to .125 mg daily  Continue your other medications.  It is very important that you restrict your sodium intake  I will see you in 6 months.  1.5 Gram Low Sodium Diet A 1.5 gram sodium diet restricts the amount of sodium in the diet to no more than 1.5 g or 1500 mg daily. The American Heart Association recommends Americans over the age of 62 to consume no more than 1500 mg of sodium each day to reduce the risk of developing high blood pressure. Research also shows that limiting sodium may reduce heart attack and stroke risk. Many foods contain sodium for flavor and sometimes as a preservative. When the amount of sodium in a diet needs to be low, it is important to know what to look for when choosing foods and drinks. The following includes some information and guidelines to help make it easier for you to adapt to a low sodium diet. QUICK TIPS  Do not add salt to food.  Avoid convenience items and fast food.  Choose unsalted snack foods.  Buy lower sodium products, often labeled as "lower sodium" or "no salt added."  Check food labels to learn how much sodium is in 1 serving.  When eating at a restaurant, ask that your food be prepared with less salt or none, if possible. READING FOOD LABELS FOR SODIUM INFORMATION The nutrition facts label is a good place to find how much sodium is in foods. Look for products with no more than 400 mg of sodium per serving. Remember that 1.5 g = 1500 mg. The food label may also list foods as:  Sodium-free: Less than 5 mg in a serving.  Very low sodium: 35 mg or less in a serving.  Low-sodium: 140 mg or less in a serving.  Light in sodium: 50% less sodium in a serving. For example, if a food that usually has 300 mg of sodium is changed to become light in sodium, it will have 150 mg of sodium.  Reduced sodium: 25% less sodium in a serving. For example, if a food that usually has 400 mg of sodium is changed to reduced sodium, it  will have 300 mg of sodium. CHOOSING FOODS Grains  Avoid: Salted crackers and snack items. Some cereals, including instant hot cereals. Bread stuffing and biscuit mixes. Seasoned rice or pasta mixes.  Choose: Unsalted snack items. Low-sodium cereals, oats, puffed wheat and rice, shredded wheat. English muffins and bread. Pasta. Meats  Avoid: Salted, canned, smoked, spiced, pickled meats, including fish and poultry. Bacon, ham, sausage, cold cuts, hot dogs, anchovies.  Choose: Low-sodium canned tuna and salmon. Fresh or frozen meat, poultry, and fish. Dairy  Avoid: Processed cheese and spreads. Cottage cheese. Buttermilk and condensed milk. Regular cheese.  Choose: Milk. Low-sodium cottage cheese. Yogurt. Sour cream. Low-sodium cheese. Fruits and Vegetables  Avoid: Regular canned vegetables. Regular canned tomato sauce and paste. Frozen vegetables in sauces. Olives. Rosita Fire. Relishes. Sauerkraut.  Choose: Low-sodium canned vegetables. Low-sodium tomato sauce and paste. Frozen or fresh vegetables. Fresh and frozen fruit. Condiments  Avoid: Canned and packaged gravies. Worcestershire sauce. Tartar sauce. Barbecue sauce. Soy sauce. Steak sauce. Ketchup. Onion, garlic, and table salt. Meat flavorings and tenderizers.  Choose: Fresh and dried herbs and spices. Low-sodium varieties of mustard and ketchup. Lemon juice. Tabasco sauce. Horseradish. SAMPLE 1.5 GRAM SODIUM MEAL PLAN Breakfast / Sodium (mg)  1 cup low-fat milk / 143 mg  1 whole-wheat English muffin / 240 mg  1 tbs  heart-healthy margarine / 153 mg  1 hard-boiled egg / 139 mg  1 small orange / 0 mg Lunch / Sodium (mg)  1 cup raw carrots / 76 mg  2 tbs no salt added peanut butter / 5 mg  2 slices whole-wheat bread / 270 mg  1 tbs jelly / 6 mg   cup red grapes / 2 mg Dinner / Sodium (mg)  1 cup whole-wheat pasta / 2 mg  1 cup low-sodium tomato sauce / 73 mg  3 oz lean ground beef / 57 mg  1 small side salad  (1 cup raw spinach leaves,  cup cucumber,  cup yellow bell pepper) with 1 tsp olive oil and 1 tsp red wine vinegar / 25 mg Snack / Sodium (mg)  1 container low-fat vanilla yogurt / 107 mg  3 graham cracker squares / 127 mg Nutrient Analysis  Calories: 1745  Protein: 75 g  Carbohydrate: 237 g  Fat: 57 g  Sodium: 1425 mg Document Released: 01/05/2005 Document Revised: 03/30/2011 Document Reviewed: 04/08/2009 [employer] Patient Information 2014 [place], [place].

## 2012-12-05 ENCOUNTER — Ambulatory Visit: Payer: Self-pay | Admitting: Physician Assistant

## 2012-12-07 ENCOUNTER — Ambulatory Visit (INDEPENDENT_AMBULATORY_CARE_PROVIDER_SITE_OTHER): Payer: Medicare Other | Admitting: Physician Assistant

## 2012-12-07 ENCOUNTER — Encounter: Payer: Self-pay | Admitting: Physician Assistant

## 2012-12-07 VITALS — BP 140/70 | HR 60 | Ht 68.0 in | Wt 216.6 lb

## 2012-12-07 DIAGNOSIS — R141 Gas pain: Secondary | ICD-10-CM

## 2012-12-07 DIAGNOSIS — K219 Gastro-esophageal reflux disease without esophagitis: Secondary | ICD-10-CM

## 2012-12-07 DIAGNOSIS — R140 Abdominal distension (gaseous): Secondary | ICD-10-CM

## 2012-12-07 DIAGNOSIS — R1314 Dysphagia, pharyngoesophageal phase: Secondary | ICD-10-CM

## 2012-12-07 NOTE — Patient Instructions (Signed)
Continue the Prilosec ( omeprazole) , take 1 tab twice daily. Chew food carefully, small bites.  Try gas X twice daily. That is an over the counter medication for gas.  Follow up as needed.

## 2012-12-07 NOTE — Progress Notes (Signed)
Subjective:    Patient ID: [name], male    DOB: 17-Nov-1931, 79 y.o.   MRN: [id number]  HPI   [name] is a pleasant 79 year old male known to Dr. Jarold Motto remotely. He had colonoscopy in August of 2011 with finding of mild diverticulosis ,  a 5 mm sessile polyp removed which was a tubular adenoma. He also had random biopsies done due to complaints of diarrhea and these were unremarkable. Patient has multiple medical problems including atrial fib flutter for which she is on Pradaxa, congestive heart failure, pulmonary fibrosis, sleep apnea, rheumatoid arthritis, hypertension and chronic depression. He had an episode of interstitial pneumonia earlier this fall. Patient was referred today due to  complaints of dysphagia. He had undergone a barium swallow in September of 2014 which did show a focal esophagitis cannot rule out Barrett's. He had a 13 mm tablet transiently lodged in the distal esophagus but this past after 60 seconds and there was no stricture noted. Also felt to have a diffuse motility disorder and had flash penetration with swallowing liquids. Patient says that he does have occasional heartburn and indigestion. He is taking Prilosec on a daily basis. He has no complaint of odynophagia. He has had long-term intermittent problems with dysphagia which she says is not happen on a daily basis. He says he has a rare episode of feeling as if something has stuck. Generally no problems with liquids. His only other complaint is intestinal gas and flatus and occasional episodes of loose stools.    Review of Systems  Constitutional: Negative.   HENT: Positive for trouble swallowing.   Eyes: Negative.   Respiratory: Negative.   Cardiovascular: Negative.   Gastrointestinal: Negative.   Endocrine: Negative.   Genitourinary: Negative.   Musculoskeletal: Negative.   Allergic/Immunologic: Negative.   Neurological: Negative.   Hematological: Negative.   Psychiatric/Behavioral: Negative.     Outpatient Prescriptions Prior to Visit  Medication Sig Dispense Refill  . Ascorbic Acid (VITAMIN C) 500 MG tablet Take 500 mg by mouth daily.        . Cholecalciferol (VITAMIN D3) 1000 UNITS CAPS Take 2 capsules by mouth.       . Coral Calcium 1000 (390 CA) MG TABS Take 2 capsules by mouth.        . dabigatran (PRADAXA) 150 MG CAPS Take 1 capsule (150 mg total) by mouth every 12 (twelve) hours.  60 capsule  6  . digoxin (LANOXIN) 0.125 MG tablet Take 1 tablet (0.125 mg total) by mouth daily.  90 tablet  3  . diltiazem (CARDIZEM CD) 180 MG 24 hr capsule TAKE 1 CAPSULE BY MOUTH ONCE A DAY.  30 capsule  5  . Docusate Sodium 100 MG capsule Take 100 mg by mouth daily.      . folic acid (FOLVITE) 800 MCG tablet Take 1 tablet (800 mcg total) by mouth daily.  30 tablet  5  . furosemide (LASIX) 40 MG tablet Take 40 mg 2 tablets in am and 40 mg 1 in pm  90 tablet  6  . gabapentin (NEURONTIN) 300 MG capsule TAKE 2 CAPSULES BY MOUTH AT BEDTIME  60 capsule  5  . HYDROcodone-acetaminophen (VICODIN) 5-500 MG per tablet Take 1 tablet by mouth every 6 (six) hours as needed.      . leflunomide (ARAVA) 10 MG tablet Take 1 tablet (10 mg total) by mouth daily.      . Melatonin 1 MG CAPS Take 10 mg by mouth daily.       Marland Kitchen  memantine (NAMENDA) 10 MG tablet TAKE (1) TABLET BY MOUTH ONCE DAILY.  30 tablet  3  . Multiple Vitamin (MULTIVITAMIN) tablet Take 1 tablet by mouth daily.        Marland Kitchen MYRBETRIQ 50 MG TB24 50 mg daily.       . NON FORMULARY at bedtime. c pap      . OMEGA-3 1000 MG CAPS Take by mouth daily.      Marland Kitchen omeprazole (PRILOSEC) 20 MG capsule Take 1 capsule (20 mg total) by mouth 2 (two) times daily.  60 capsule  3   No facility-administered medications prior to visit.   Allergies  Allergen Reactions  . Methotrexate     REACTION: ONLY HAS ONE KIDNEY   Patient Active Problem List   Diagnosis Date Noted  . Dysphagia, unspecified(787.20) 09/30/2012  . Chronic diastolic CHF (congestive heart failure)    . Chronic a-fib 01/22/2011  . Anxiety 09/15/2010  . Depression 09/15/2010  . Insomnia 09/15/2010  . BENIGN POSITIONAL VERTIGO 12/30/2009  . HYPERGLYCEMIA 12/30/2009  . HYPERLIPIDEMIA 07/17/2009  . PERIPHERAL NEUROPATHY 07/17/2009  . HYPERTENSION 07/17/2009  . Atrial flutter 07/17/2009  . Chronic systolic heart failure 07/17/2009  . VENOUS INSUFFICIENCY, LEFT LEG 07/17/2009  . ALLERGIC RHINITIS 07/17/2009  . Rheumatoid arthritis 07/17/2009  . Idiopathic interstitial pneumonia 05/01/2009  . OBSTRUCTIVE SLEEP APNEA 05/01/2009   History  Substance Use Topics  . Smoking status: Former Smoker -- 1.00 packs/day for 3 years    Types: Cigarettes    Quit date: 01/20/2000  . Smokeless tobacco: Never Used     Comment: Single, lives alone. 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 His family contact would be his sister [name] [phone]  . Alcohol Use: No     Comment: Former       Objective:   Physical Exam  well-developed elderly white male in no acute distress, pleasant blood pressure 140/70 pulse 60 height 5 foot 8 weight 216. HEENT; nontraumatic normocephalic EOMI PERRLA sclera anicteric, Supple ;no JVD, Cardiovascular; regular rate and rhythm with S1-S2 no murmur or gallop, Pulmonary ;clear bilaterally, Abdomen; large soft nontender nondistended bowel sounds are active there is no palpable mass or hepatosplenomegaly, EXt; no clubbing cyanosis or edema skin warm and dry, Psych; mood and affect normal and appropriate        Assessment & Plan:  #25  79 year old male with mild intermittent dysphagia and barium swallow showing a diffuse motility disorder and transient hangup of a barium tablet but no stricture. By history patient is not having any significant symptoms.  #2 chronic GERD #3 gas and bloating #4 atrial fib flutter on chronic anticoagulation #5 congestive heart failure #6 pulmonary fibrosis #7 sleep apnea #8 hypertension #9 rheumatoid  arthritis #10 recent pneumonia #11 history of adenomatous colon polyp on colonoscopy August 2011 not schedule for an interval followup due to age  Plan; encourage patient to continue omeprazole 20 mg by mouth twice daily Discussed nature of chronic motility disorders and need for careful chewing and taking slow small bites followed by small amounts of liquid Do not feel that endoscopic intervention is indicated at this time Trial of Phazyme or Gas-X before meals for complaints of gas and bloating which are likely multifactorial in a patient on numerous medications. He will followup with Dr. Jarold Motto as needed.

## 2012-12-19 ENCOUNTER — Other Ambulatory Visit: Payer: Self-pay | Admitting: *Deleted

## 2012-12-19 MED ORDER — DABIGATRAN ETEXILATE MESYLATE 150 MG PO CAPS: 150.0000 mg | ORAL_CAPSULE | Freq: Two times a day (BID) | ORAL | Status: DC

## 2012-12-29 ENCOUNTER — Other Ambulatory Visit: Payer: Self-pay | Admitting: *Deleted

## 2012-12-29 MED ORDER — DILTIAZEM HCL ER COATED BEADS 180 MG PO CP24: ORAL_CAPSULE | ORAL | Status: DC

## 2013-01-05 ENCOUNTER — Ambulatory Visit (INDEPENDENT_AMBULATORY_CARE_PROVIDER_SITE_OTHER): Payer: Medicare Other | Admitting: Pulmonary Disease

## 2013-01-05 ENCOUNTER — Encounter: Payer: Self-pay | Admitting: Pulmonary Disease

## 2013-01-05 VITALS — BP 110/62 | HR 95 | Temp 98.0°F | Ht 68.0 in | Wt 214.2 lb

## 2013-01-05 DIAGNOSIS — R1310 Dysphagia, unspecified: Secondary | ICD-10-CM

## 2013-01-05 DIAGNOSIS — J84111 Idiopathic interstitial pneumonia, not otherwise specified: Secondary | ICD-10-CM

## 2013-01-05 DIAGNOSIS — G4730 Sleep apnea, unspecified: Secondary | ICD-10-CM

## 2013-01-05 NOTE — Progress Notes (Signed)
Subjective:    Patient ID: [name], male    DOB: 09-21-1931, 79 y.o.   MRN: [id number]  HPI  PCP - Felicity Coyer  Rheum - [place] Age  Cards- [place]   79 year old male with pulmonary Fibrosis, RA and OSA , CHF with poor EF.  Was admited 3/ 2011 with A Flutter/Fib and CHF exacerbation and needed ICU stay and amiodarone. CT showed evidence of diffuse pulmonary fibrosis. He was cardioverted and ablated with retun to sinus. Cath 04/06/2009 was normal coronaries.  PSG 8/11 showed - moderate obstructive sleep apnea AHI 22/h, lowest desatn 78% - corrected by cpaP 9 cm with med FF mask  ? persistent REM related desaturation on 9 cm related to underlying cardiopulmonary disease  October, 2011 ONO on CPAP - destauration x 9 mins only over total recording time x 10 h,  PFTs showed mild restriction, no obstruction   10/17/12  Returned after 2 y with worse chest congestion & Worsening coarsened interstitial opacities on CXR   Now on Ritizan q , on arava since start of 2011, previously on low dose chronic pred ,mobility much improved - uses hydrocodone as needed (from rheum)  Takes 80 lasix am & 40 pm- renal fn OK  Not on amiodarone anymore  Did not desatn on exertion  COmpliant with CPAP -Mask ok, pressure ok  BNP 172, ESR 35, RA 38, CCP neg  HRCT >> Chronic interstitial lung disease with mildly progressed peripheral reticulation and honeycombing in some areas since 2011.  No response to Abx, Improved with prednisone  >>tapered off    01/05/2013  Chief Complaint  Patient presents with  . Follow-up    reports he wears his CPAP everynight x 6-12 hrs. Pt reports breathing has been doing okay. C/o prod cough w/ clear-yellow phlem. denies any chest tx and no chest tx    Ba swallow 10/20/12- Focal esophagitis just above the thoracic inlet suggesting Barrett's esophagus. there is no stricture.  Diffuse esophageal motility disorder. Flash laryngeal penetration with swallowing. Saw  GI- back  on PPI COugh, dyspnea -stable   Past Medical History  Diagnosis Date  . Atrial flutter     s/p ablation for RVR  03/2009, pradaxa ongoing  . VENOUS INSUFFICIENCY, LEFT LEG   . PULMONARY FIBROSIS, INTERSTITIAL   . OBSTRUCTIVE SLEEP APNEA   . BENIGN POSITIONAL VERTIGO   . HYPERGLYCEMIA 2011  . Interstitial lung disease   . ALLERGIC RHINITIS   . Rheumatoid arthritis(714.0)   . Chronic diastolic CHF (congestive heart failure)   . PERIPHERAL NEUROPATHY   . HYPERTENSION   . HYPERLIPIDEMIA   . DEPRESSION     [facility] hosp 01/2011 for SI  . Anxiety   . Morbid obesity   . Diverticulosis of colon (without mention of hemorrhage)       Review of Systems neg for any significant sore throat, dysphagia, itching, sneezing, nasal congestion or excess/ purulent secretions, fever, chills, sweats, unintended wt loss, pleuritic or exertional cp, hempoptysis, orthopnea pnd or change in chronic leg swelling. Also denies presyncope, palpitations, heartburn, abdominal pain, nausea, vomiting, diarrhea or change in bowel or urinary habits, dysuria,hematuria, rash, arthralgias, visual complaints, headache, numbness weakness or ataxia.     Objective:   Physical Exam  Gen. Pleasant, well-nourished, in no distress ENT - no lesions, no post nasal drip Neck: No JVD, no thyromegaly, no carotid bruits Lungs: no use of accessory muscles, no dullness to percussion, bibasal rales, no rhonchi  Cardiovascular: Rhythm regular, heart  sounds  normal, no murmurs or gallops, no peripheral edema Musculoskeletal: No deformities, no cyanosis or clubbing        Assessment & Plan:

## 2013-01-05 NOTE — Patient Instructions (Signed)
You have fibrosis in your lungs Stay on reflux medication Repeat breathing test in April 2014 to see if fibrosis getting worse Call us if worse

## 2013-01-09 NOTE — Assessment & Plan Note (Signed)
Ct CPAP 9 cm

## 2013-01-09 NOTE — Assessment & Plan Note (Signed)
Ct PPI - Wonder if this is contributing to pulmonary issues

## 2013-01-09 NOTE — Assessment & Plan Note (Signed)
May be IPF - RA appears quiescent clinically, also CCP neg Repeat PFTs/ imaging in April 2014 to see if fibrosis getting worse Call us if worse

## 2013-01-14 ENCOUNTER — Telehealth: Payer: Self-pay | Admitting: Pulmonary Disease

## 2013-01-14 NOTE — Telephone Encounter (Signed)
D/w dr Fredirick Lathe - CT c/w UIP

## 2013-01-24 ENCOUNTER — Other Ambulatory Visit: Payer: Self-pay | Admitting: *Deleted

## 2013-01-24 MED ORDER — MEMANTINE HCL 10 MG PO TABS: ORAL_TABLET | ORAL | Status: DC

## 2013-01-30 ENCOUNTER — Other Ambulatory Visit: Payer: Self-pay

## 2013-01-30 MED ORDER — FUROSEMIDE 40 MG PO TABS: ORAL_TABLET | ORAL | Status: DC

## 2013-02-03 ENCOUNTER — Other Ambulatory Visit: Payer: Self-pay | Admitting: *Deleted

## 2013-02-03 MED ORDER — FUROSEMIDE 40 MG PO TABS: ORAL_TABLET | ORAL | Status: DC

## 2013-04-04 ENCOUNTER — Telehealth: Payer: Self-pay | Admitting: Cardiology

## 2013-04-04 NOTE — Telephone Encounter (Signed)
Returned call to patient no answer.LMTC. 

## 2013-04-04 NOTE — Telephone Encounter (Signed)
New message    C/o itching from medication - pradaxa  150 mg .

## 2013-04-05 NOTE — Telephone Encounter (Signed)
Received call from patient he stated he cannot take pradaxa 150 mg twice a day causes itching.Stated he is able to take pradaxa 150 mg daily with no itching.Message sent to Dr.Jordan for advice.

## 2013-04-06 NOTE — Telephone Encounter (Signed)
Pradaxa does not work once a day. We could switch to Xarelto 20 mg daily. This would be better given once a day dosing.   Tameisha Covell [place] MD, [facility]

## 2013-04-06 NOTE — Telephone Encounter (Signed)
Returned call to patient no answer.LMTC. 

## 2013-04-07 MED ORDER — RIVAROXABAN 20 MG PO TABS: 20.0000 mg | ORAL_TABLET | Freq: Every day | ORAL | Status: DC

## 2013-04-07 NOTE — Telephone Encounter (Signed)
Patient called no answer.Left message on personal voice mail Dr.Jordan advised to stop pradaxa and start Xartelto 20 mg daily.Samples left at 3rd floor front desk.Advised to call me back on Tuesday 04/11/13 to let me know you understood this message.

## 2013-04-07 NOTE — Addendum Note (Signed)
Addended by: Meda Klinefelter D on: 04/07/2013 06:53 PM   Modules accepted: Orders, Medications

## 2013-04-10 ENCOUNTER — Telehealth: Payer: Self-pay | Admitting: *Deleted

## 2013-04-10 NOTE — Telephone Encounter (Signed)
[employer] rx approved PA  Through 04/11/2014, PA # [id number], pharmacy notified

## 2013-04-10 NOTE — Telephone Encounter (Signed)
PA to [facility]  For xarelto daily

## 2013-04-11 NOTE — Telephone Encounter (Signed)
Spoke to patient he received my message on 04/07/13.Stated he has started on xarelto.Advised to keep appointment with Dr.Jordan 06/05/13 and call sooner if needed.

## 2013-04-18 ENCOUNTER — Other Ambulatory Visit: Payer: Self-pay | Admitting: *Deleted

## 2013-04-18 MED ORDER — MEMANTINE HCL 10 MG PO TABS: ORAL_TABLET | ORAL | Status: DC

## 2013-05-01 ENCOUNTER — Telehealth: Payer: Self-pay

## 2013-05-01 NOTE — Telephone Encounter (Signed)
Placed samples back in the closet

## 2013-05-02 ENCOUNTER — Other Ambulatory Visit: Payer: Self-pay | Admitting: *Deleted

## 2013-05-02 ENCOUNTER — Other Ambulatory Visit: Payer: Self-pay | Admitting: Adult Health

## 2013-05-02 MED ORDER — GABAPENTIN 300 MG PO CAPS: ORAL_CAPSULE | ORAL | Status: DC

## 2013-05-02 NOTE — Telephone Encounter (Signed)
Received faxed refill request from [facility] for pt's Omeprazole 20mg  BID Last ov 12.18.14, follow up in 4 months w/ PFT -- recall in system  Last refill given 10.21.14 #60 w/ 3 refills  Refill sent to pharmacy

## 2013-05-10 ENCOUNTER — Other Ambulatory Visit: Payer: Self-pay | Admitting: *Deleted

## 2013-05-10 MED ORDER — FUROSEMIDE 40 MG PO TABS: ORAL_TABLET | ORAL | Status: DC

## 2013-05-31 ENCOUNTER — Encounter: Payer: Self-pay | Admitting: Physician Assistant

## 2013-05-31 ENCOUNTER — Ambulatory Visit (INDEPENDENT_AMBULATORY_CARE_PROVIDER_SITE_OTHER): Payer: Medicare Other | Admitting: Physician Assistant

## 2013-05-31 VITALS — BP 118/70 | HR 89 | Temp 97.7°F | Resp 20 | Wt 211.0 lb

## 2013-05-31 DIAGNOSIS — J441 Chronic obstructive pulmonary disease with (acute) exacerbation: Secondary | ICD-10-CM

## 2013-05-31 MED ORDER — LEVOFLOXACIN 500 MG PO TABS: 500.0000 mg | ORAL_TABLET | Freq: Every day | ORAL | Status: DC

## 2013-05-31 NOTE — Progress Notes (Signed)
Pre visit review using our clinic review tool, if applicable. No additional management support is needed unless otherwise documented below in the visit note. 

## 2013-05-31 NOTE — Patient Instructions (Signed)
Levaquin daily for 7 days for acute COPD exacerbation.  Followup in one week To reassess, followup sooner if symptoms worsen or fail to improve despite treatment.    Chronic Obstructive Pulmonary Disease Exacerbation  Chronic obstructive pulmonary disease (COPD) is a common lung problem. In COPD, the flow of air from the lungs is limited. COPD exacerbations are times that breathing gets worse and you need extra treatment. Without treatment they can be life threatening. If they happen often, your lungs can become more damaged. HOME CARE  Do not smoke.  Avoid tobacco smoke and other things that bother your lungs.  If given, take your antibiotic medicine as told. Finish the medicine even if you start to feel better.  Only take medicines as told by your doctor.  Drink enough fluids to keep your pee (urine) clear or pale yellow (unless your doctor has told you not to).  Use a cool mist machine (vaporizer).  If you use oxygen or a machine that turns liquid medicine into a mist (nebulizer), continue to use them as told.  Keep up with shots (vaccinations) as told by your doctor.  Exercise regularly.  Eat healthy foods.  Keep all doctor visits as told. GET HELP RIGHT AWAY IF:  You are very short of breath and it gets worse.  You have trouble talking.  You have bad chest pain.  You have blood in your spit (sputum).  You have a fever.  You keep throwing up (vomiting).  You feel weak, or you pass out (faint).  You feel confused.  You keep getting worse. MAKE SURE YOU:   Understand these instructions.  Will watch your condition.  Will get help right away if you are not doing well or get worse. Document Released: 12/25/2010 Document Revised: 10/26/2012 Document Reviewed: 09/09/2012 [facility] Patient Information 2014 [place], [place].

## 2013-05-31 NOTE — Progress Notes (Signed)
Subjective:    Patient ID: [name], male    DOB: 06-Feb-1931, 80 y.o.   MRN: [id number]  Cough This is a new problem. The current episode started in the past 7 days. The problem has been gradually worsening. The problem occurs every few minutes. The cough is productive of sputum (Pt has Hx of COPd, and has had increased sputum production and increased sputum discoloration  yellow.). Pertinent negatives include no chest pain, chills, ear congestion, ear pain, fever, headaches, heartburn, hemoptysis, myalgias, nasal congestion, postnasal drip, rash, rhinorrhea, sore throat, shortness of breath, sweats, weight loss or wheezing. Nothing aggravates the symptoms. Treatments tried: Cough Drops. The treatment provided no relief. His past medical history is significant for COPD.     Review of Systems  Constitutional: Negative for fever, chills and weight loss.  HENT: Negative for ear pain, postnasal drip, rhinorrhea and sore throat.   Respiratory: Positive for cough. Negative for hemoptysis, shortness of breath and wheezing.   Cardiovascular: Negative for chest pain.  Gastrointestinal: Negative for heartburn, nausea, vomiting and diarrhea.  Musculoskeletal: Negative for myalgias.  Skin: Negative for rash.  Neurological: Negative for headaches.  All other systems reviewed and are negative.   Past Medical History  Diagnosis Date  . Atrial flutter     s/p ablation for RVR  03/2009, pradaxa ongoing  . VENOUS INSUFFICIENCY, LEFT LEG   . PULMONARY FIBROSIS, INTERSTITIAL   . OBSTRUCTIVE SLEEP APNEA   . BENIGN POSITIONAL VERTIGO   . HYPERGLYCEMIA 2011  . Interstitial lung disease   . ALLERGIC RHINITIS   . Rheumatoid arthritis(714.0)   . Chronic diastolic CHF (congestive heart failure)   . PERIPHERAL NEUROPATHY   . HYPERTENSION   . HYPERLIPIDEMIA   . DEPRESSION     [facility] 01/2011 for SI  . Anxiety   . Morbid obesity   . Diverticulosis of colon (without mention of hemorrhage)    Past  Surgical History  Procedure Laterality Date  . 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 Shoulder surgery Right 09/2009  . Varicose vein surgery      Left Leg  . Radiofrequency ablation    . Colonoscopy w/ biopsies      reports that he quit smoking about 13 years ago. His smoking use included Cigarettes. He has a 3 pack-year smoking history. He has never used smokeless tobacco. He reports that he does not drink alcohol or use illicit drugs. family history includes Heart attack (age of onset: 7) in his father; Other (age of onset: 38) in his mother. There is no history of Colon cancer. Allergies  Allergen Reactions  . Methotrexate     REACTION: ONLY HAS ONE KIDNEY        Objective:   Physical Exam  Nursing note and vitals reviewed. Constitutional: He is oriented to person, place, and time. He appears well-developed and well-nourished. No distress.  HENT:  Head: Normocephalic and atraumatic.  Right Ear: External ear normal.  Left Ear: External ear normal.  Nose: Nose normal.  Mouth/Throat: Oropharynx is clear and moist. No oropharyngeal exudate.  Bilateral frontal maxillary sinuses nontender to palpation  Eyes: Conjunctivae and EOM are normal. Pupils are equal, round, and reactive to light.  Neck: Normal range of motion. Neck supple. No JVD present.  Cardiovascular: Normal heart sounds and intact distal pulses.  Exam reveals no gallop and no friction rub.   No murmur  heard. Irregular rate and rhythm.  Pulmonary/Chest: Effort normal. No stridor. No respiratory distress. He has no wheezes. He has no rales. He exhibits no tenderness.  Rhonchi heard in bilateral lung bases.  Musculoskeletal: Normal range of motion. He exhibits no edema.  Lymphadenopathy:    He has no cervical adenopathy.  Neurological: He is alert and oriented to person, place, and time.  Skin: Skin is warm and dry. No rash noted. He is not diaphoretic. No  erythema. No pallor.  Psychiatric: He has a normal mood and affect. His behavior is normal. Judgment and thought content normal.   Filed Vitals:   05/31/13 1131  BP: 118/70  Pulse: 89  Temp: 97.7 F (36.5 C)  Resp: 20    Lab Results  Component Value Date   WBC 9.5 11/04/2011   HGB 12.0* 11/04/2011   HCT 37.2* 11/04/2011   PLT 280.0 11/04/2011   GLUCOSE 128* 09/21/2012   CHOL 171 09/21/2012   TRIG 107.0 09/21/2012   HDL 27.20* 09/21/2012   LDLCALC 122* 09/21/2012   ALT 30 10/01/2010   AST 18 10/01/2010   NA 137 09/21/2012   K 3.8 09/21/2012   CL 102 09/21/2012   CREATININE 1.0 09/21/2012   BUN 15 09/21/2012   CO2 27 09/21/2012   TSH 2.74 11/04/2011   PSA 5.04* 09/26/2010   INR 1.14 09/19/2010   HGBA1C 6.6* 09/21/2012       Assessment & Plan:  [name] was seen today for cough.  Diagnoses and associated orders for this visit:  COPD exacerbation - levofloxacin (LEVAQUIN) 500 MG tablet; Take 1 tablet (500 mg total) by mouth daily.    Plan to follow up in one week to reassess.  Patient Instructions  Levaquin daily for 7 days for acute COPD exacerbation.  Followup in one week To reassess, followup sooner if symptoms worsen or fail to improve despite treatment.

## 2013-06-02 ENCOUNTER — Telehealth: Payer: Self-pay

## 2013-06-02 NOTE — Telephone Encounter (Signed)
Placed samples of xarelto up front

## 2013-06-05 ENCOUNTER — Ambulatory Visit: Payer: Self-pay | Admitting: Cardiology

## 2013-06-07 ENCOUNTER — Ambulatory Visit (INDEPENDENT_AMBULATORY_CARE_PROVIDER_SITE_OTHER): Payer: Medicare Other | Admitting: Physician Assistant

## 2013-06-07 ENCOUNTER — Encounter: Payer: Self-pay | Admitting: Physician Assistant

## 2013-06-07 ENCOUNTER — Ambulatory Visit: Payer: Medicare Other | Admitting: Physician Assistant

## 2013-06-07 VITALS — BP 120/74 | HR 78 | Temp 98.0°F | Resp 18 | Wt 216.0 lb

## 2013-06-07 DIAGNOSIS — Z09 Encounter for follow-up examination after completed treatment for conditions other than malignant neoplasm: Secondary | ICD-10-CM

## 2013-06-07 DIAGNOSIS — J441 Chronic obstructive pulmonary disease with (acute) exacerbation: Secondary | ICD-10-CM

## 2013-06-07 DIAGNOSIS — R609 Edema, unspecified: Secondary | ICD-10-CM

## 2013-06-07 DIAGNOSIS — I4891 Unspecified atrial fibrillation: Secondary | ICD-10-CM

## 2013-06-07 NOTE — Progress Notes (Signed)
Subjective:    Patient ID: [name], male    DOB: October 20, 1931, 80 y.o.   MRN: [id number]  HPI Patient is a 80 year old Caucasian male presenting to the clinic for followup of an acute exacerbation of COPD. Patient was seen last week and had increased sputum production and increased sputum purulence, with the history of chronic bronchitis. Patient was diagnosed with an acute exacerbation of COPD and was prescribed Levaquin for one week and told to followup in one week. Patient presents today stating that he feels that his symptoms have greatly improved. He is still complaining of a cough, though it is better. His main complaint today is that he has had some increased swelling in his legs while he has been taking the Levaquin. Patient normally has lower extremity edema and takes Lasix for this, however he states that the Lasix is not successfully relieving the edema while he is on the Levaquin. He states he completed his course of Levaquin this morning. Pt also states that he has been very active over the past 2 weeks, and has been on his feet for many hours of the day recently, which is not his norm, and he believes this may also be affecting his edema.  He denies fevers, chills, nausea, vomiting, diarrhea, and shortness of breath.  Review of Systems As per the history of present illness and are otherwise negative.  Past Medical History  Diagnosis Date  . Atrial flutter     s/p ablation for RVR  03/2009, pradaxa ongoing  . VENOUS INSUFFICIENCY, LEFT LEG   . PULMONARY FIBROSIS, INTERSTITIAL   . OBSTRUCTIVE SLEEP APNEA   . BENIGN POSITIONAL VERTIGO   . HYPERGLYCEMIA 2011  . Interstitial lung disease   . ALLERGIC RHINITIS   . Rheumatoid arthritis(714.0)   . Chronic diastolic CHF (congestive heart failure)   . PERIPHERAL NEUROPATHY   . HYPERTENSION   . HYPERLIPIDEMIA   . DEPRESSION     [facility] hosp 01/2011 for SI  . Anxiety   . Morbid obesity   . Diverticulosis of colon (without mention of  hemorrhage)    Past Surgical History  Procedure Laterality Date  . 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 Shoulder surgery Right 09/2009  . Varicose vein surgery      Left Leg  . Radiofrequency ablation    . Colonoscopy w/ biopsies      reports that he quit smoking about 13 years ago. His smoking use included Cigarettes. He has a 3 pack-year smoking history. He has never used smokeless tobacco. He reports that he does not drink alcohol or use illicit drugs. family history includes Heart attack (age of onset: 69) in his father; Other (age of onset: 104) in his mother. There is no history of Colon cancer. Allergies  Allergen Reactions  . Methotrexate     REACTION: ONLY HAS ONE KIDNEY       Objective:   Physical Exam  Nursing note and vitals reviewed. Constitutional: He is oriented to person, place, and time. He appears well-developed and well-nourished. No distress.  HENT:  Head: Normocephalic and atraumatic.  Eyes: Conjunctivae and EOM are normal. Pupils are equal, round, and reactive to light.  Neck: Normal range of motion. Neck supple. No JVD present.  Cardiovascular: Normal heart sounds and intact distal pulses.  Exam reveals no gallop and no friction rub.   No murmur heard. Irregular rate and  rhythm  Pulmonary/Chest: Effort normal. No stridor. No respiratory distress. He has no wheezes. He has no rales. He exhibits no tenderness.  Mild rhonchi lower lung bases.  Musculoskeletal: Normal range of motion. He exhibits edema ( 1+bilateral lower extremity edema). He exhibits no tenderness.  Lymphadenopathy:    He has no cervical adenopathy.  Neurological: He is alert and oriented to person, place, and time.  Skin: Skin is warm and dry. No rash noted. He is not diaphoretic. No erythema. No pallor.  Psychiatric: He has a normal mood and affect. His behavior is normal. Judgment and thought content normal.    Filed Vitals:     06/07/13 1100  BP: 120/74  Pulse: 78  Temp: 98 F (36.7 C)  Resp: 18   Lab Results  Component Value Date   WBC 9.5 11/04/2011   HGB 12.0* 11/04/2011   HCT 37.2* 11/04/2011   PLT 280.0 11/04/2011   GLUCOSE 128* 09/21/2012   CHOL 171 09/21/2012   TRIG 107.0 09/21/2012   HDL 27.20* 09/21/2012   LDLCALC 122* 09/21/2012   ALT 30 10/01/2010   AST 18 10/01/2010   NA 137 09/21/2012   K 3.8 09/21/2012   CL 102 09/21/2012   CREATININE 1.0 09/21/2012   BUN 15 09/21/2012   CO2 27 09/21/2012   TSH 2.74 11/04/2011   PSA 5.04* 09/26/2010   INR 1.14 09/19/2010   HGBA1C 6.6* 09/21/2012      Assessment & Plan:  Natnael was seen today for 1 week follow up.  Diagnoses and associated orders for this visit:  Follow up - Overall much improved. Some mild cough still, will manage with OTC mucinex. No indication for continued antibiotic therapy.  COPD exacerbation Comments: Resolving. Will have pt continue treatment with over the counter mucinex.  Edema Comments: Will have pt restrict salt in diet, increase leg raises.  Atrial fibrillation Comments: Pt currently in Afib. Asymptomatic.  Being managed by cardiology.   Plan to follow up in 1 week to reassess.  Patient Instructions  Take over-the-counter Mucinex for thick secretions.  Maintain hydration by drinking plenty of water.  Restricts salt in diet and increase leg raises during the day to try and help reduce the edema.  Followup in one week to reassess. Followup sooner if symptoms worsen or fail to improve.

## 2013-06-07 NOTE — Progress Notes (Signed)
Pre visit review using our clinic review tool, if applicable. No additional management support is needed unless otherwise documented below in the visit note. 

## 2013-06-07 NOTE — Patient Instructions (Signed)
Take over-the-counter Mucinex for thick secretions.  Maintain hydration by drinking plenty of water.  Restricts salt in diet and increase leg raises during the day to try and help reduce the edema.  Followup in one week to reassess. Followup sooner if symptoms worsen or fail to improve.   Edema Edema is a buildup of fluids. It is most common in the feet, ankles, and legs. This happens more as a person ages. It may affect one or both legs. HOME CARE   Raise (elevate) the legs or ankles above the level of the heart while lying down.  Avoid sitting or standing still for a long time.  Exercise the legs to help the puffiness (swelling) go down.  A low-salt diet may help lessen the puffiness.  Only take medicine as told by your doctor. GET HELP RIGHT AWAY IF:   You develop shortness of breath or chest pain.  You cannot breathe when you lie down.  You have more puffiness that does not go away with treatment.  You develop pain or redness in the areas that are puffy.  You have a temperature by mouth above 102 F (38.9 C), not controlled by medicine.  You gain 03 lb/1.4 kg or more in 1 day or 05 lb/2.3 kg in a week. MAKE SURE YOU:   Understand these instructions.  Will watch your condition.  Will get help right away if you are not doing well or get worse. Document Released: 06/24/2007 Document Revised: 03/30/2011 Document Reviewed: 06/24/2007 [facility] Patient Information 2014 [place], [place].

## 2013-06-13 ENCOUNTER — Encounter: Payer: Self-pay | Admitting: Cardiovascular Disease

## 2013-06-14 ENCOUNTER — Ambulatory Visit (INDEPENDENT_AMBULATORY_CARE_PROVIDER_SITE_OTHER): Payer: Medicare Other | Admitting: Physician Assistant

## 2013-06-14 ENCOUNTER — Encounter: Payer: Self-pay | Admitting: Physician Assistant

## 2013-06-14 ENCOUNTER — Ambulatory Visit (INDEPENDENT_AMBULATORY_CARE_PROVIDER_SITE_OTHER)
Admission: RE | Admit: 2013-06-14 | Discharge: 2013-06-14 | Disposition: A | Discharge status: Home / Self Care | Payer: Medicare Other | Source: Ambulatory Visit | Attending: Physician Assistant | Admitting: Physician Assistant

## 2013-06-14 VITALS — BP 114/76 | HR 86 | Temp 97.9°F | Resp 18 | Wt 224.0 lb

## 2013-06-14 DIAGNOSIS — Z09 Encounter for follow-up examination after completed treatment for conditions other than malignant neoplasm: Secondary | ICD-10-CM

## 2013-06-14 DIAGNOSIS — J441 Chronic obstructive pulmonary disease with (acute) exacerbation: Secondary | ICD-10-CM

## 2013-06-14 DIAGNOSIS — I4891 Unspecified atrial fibrillation: Secondary | ICD-10-CM

## 2013-06-14 DIAGNOSIS — R609 Edema, unspecified: Secondary | ICD-10-CM

## 2013-06-14 LAB — BASIC METABOLIC PANEL
BUN: 16 mg/dL (ref 6–23)
CALCIUM: 8.5 mg/dL (ref 8.4–10.5)
CO2: 30 meq/L (ref 19–32)
CREATININE: 1.2 mg/dL (ref 0.4–1.5)
Chloride: 100 mEq/L (ref 96–112)
GFR: 64.45 mL/min (ref >60.00)
GLUCOSE: 92 mg/dL (ref 70–99)
Potassium: 4 mEq/L (ref 3.5–5.1)
SODIUM: 140 meq/L (ref 135–145)

## 2013-06-14 NOTE — Progress Notes (Signed)
Pre visit review using our clinic review tool, if applicable. No additional management support is needed unless otherwise documented below in the visit note. 

## 2013-06-14 NOTE — Progress Notes (Signed)
Subjective:    Patient ID: [name], male    DOB: February 14, 1931, 80 y.o.   MRN: [id number]  HPI Patient is a 80 year old Caucasian male presents to the clinic for followup of a COPD exacerbation. Patient was seen originally 2 weeks ago and diagnosed with COPD exacerbation and placed on Levaquin. Last week patient was seen for followup, had finished Levaquin, felt much improved, was still coughing some, and was told to manage this with over-the-counter Mucinex. The patient was also complaining at that time of edema in his lower extremities which he thought made have been from a combination of the Levaquin and an increase in activity and standing on his legs more than usual. He was instructed to restrict his salt in his diet and increase his leg raises. Patient states that his congestion and cough are clear now. His main concern today is his lower extremity edema which is increasingly worsened over the past week. The patient states that he has restrict his salt, however he has maintained high activity level this past week and has not been raising his legs. He says that his legs are not particularly painful however they feel very tight. His left leg is more swollen than his right, but states this is always the case for his leg swelling. Patient states that he has tried to increase his Lasix on his own the past week, occasionally adding an additional pill a few hours after his morning dose. He denies fevers, chills, nausea, vomiting, diarrhea, and shortness of breath.   Review of Systems As per the history of present illness and otherwise negative.   Past Medical History  Diagnosis Date  . Atrial flutter     s/p ablation for RVR  03/2009, pradaxa ongoing  . VENOUS INSUFFICIENCY, LEFT LEG   . PULMONARY FIBROSIS, INTERSTITIAL   . OBSTRUCTIVE SLEEP APNEA   . BENIGN POSITIONAL VERTIGO   . HYPERGLYCEMIA 2011  . Interstitial lung disease   . ALLERGIC RHINITIS   . Rheumatoid arthritis(714.0)   .  Chronic diastolic CHF (congestive heart failure)   . PERIPHERAL NEUROPATHY   . HYPERTENSION   . HYPERLIPIDEMIA   . DEPRESSION     [facility] hosp 01/2011 for SI  . Anxiety   . Morbid obesity   . Diverticulosis of colon (without mention of hemorrhage)    Past Surgical History  Procedure Laterality Date  . 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 Shoulder surgery Right 09/2009  . Varicose vein surgery      Left Leg  . Radiofrequency ablation    . Colonoscopy w/ biopsies      reports that he quit smoking about 13 years ago. His smoking use included Cigarettes. He has a 3 pack-year smoking history. He has never used smokeless tobacco. He reports that he does not drink alcohol or use illicit drugs. family history includes Heart attack (age of onset: 43) in his father; Other (age of onset: 34) in his mother. There is no history of Colon cancer. Allergies  Allergen Reactions  . Methotrexate     REACTION: ONLY HAS ONE KIDNEY       Objective:   Physical Exam  Nursing note and vitals reviewed. Constitutional: He is oriented to person, place, and time. He appears well-developed and well-nourished. No distress.  HENT:  Head: Normocephalic and atraumatic.  Eyes: Conjunctivae and EOM are normal. Pupils are equal, round, and reactive  to light.  Neck: Normal range of motion. Neck supple.  Cardiovascular: Normal heart sounds and intact distal pulses.  Exam reveals no gallop and no friction rub.   No murmur heard. Irregular rate and rhythm  Pulmonary/Chest: Effort normal. No respiratory distress. He has no wheezes. He has rales. He exhibits no tenderness.  Very fine crackles heard in bilateral lung bases.  No rhonchi heard in lung bases.  Musculoskeletal: Normal range of motion. He exhibits edema (2+ pitting edema right lower extremity, greater than 2+ pitting edema left lower extremity.) and tenderness (tenderness to palpation of the lower  extremities.).  Neurological: He is alert and oriented to person, place, and time.  Skin: Skin is warm and dry. No rash noted. He is not diaphoretic. No erythema. No pallor.  Psychiatric: He has a normal mood and affect. His behavior is normal. Judgment and thought content normal.    Filed Vitals:   06/14/13 1004  BP: 114/76  Pulse: 86  Temp: 97.9 F (36.6 C)  Resp: 18    Lab Results  Component Value Date   WBC 9.5 11/04/2011   HGB 12.0* 11/04/2011   HCT 37.2* 11/04/2011   PLT 280.0 11/04/2011   GLUCOSE 128* 09/21/2012   CHOL 171 09/21/2012   TRIG 107.0 09/21/2012   HDL 27.20* 09/21/2012   LDLCALC 122* 09/21/2012   ALT 30 10/01/2010   AST 18 10/01/2010   NA 137 09/21/2012   K 3.8 09/21/2012   CL 102 09/21/2012   CREATININE 1.0 09/21/2012   BUN 15 09/21/2012   CO2 27 09/21/2012   TSH 2.74 11/04/2011   PSA 5.04* 09/26/2010   INR 1.14 09/19/2010   HGBA1C 6.6* 09/21/2012       Assessment & Plan:  [name] was seen today for follow-up.  Diagnoses and associated orders for this visit:  Follow up Comments: Cough and congestion are resolved. Edema is worsening. - Basic Metabolic Panel - DG Chest 2 View; Future  COPD exacerbation Comments: Resolved. Now with faint crackles, possibly due to history of interstitial lung disease. Will get chest x-ray to confirm. - DG Chest 2 View; Future  Edema Comments: Worsening. Instructed patient to increase leg raises. Will increase Lasix to twice in the morning and twice in the evening. - Basic Metabolic Panel  Atrial fibrillation Comments: In afib currently. Monitored by cardiology. Asymptomatic.    Plan for pt to follow up with PCP in 1 week to reassess.  Patient Instructions  We will call you with the results of your lab work and your chest x-ray when they're available. You have a chest x-ray done today at the [place] office.  You need to increase your leg raises daily to try and help the fluid in your legs decrease. Continue to restrict salt in your  diet.  Take your Lasix twice in the morning as usual, and take 2 Lasix in the evening as well. You'll now be taking 2 in the morning and 2 in the evening. This may help decrease your edema as well.  Followup in one week with your primary care provider to reassess. Followup sooner if symptoms worsen or fail to improve despite treatment.

## 2013-06-14 NOTE — Patient Instructions (Addendum)
We will call you with the results of your lab work and your chest x-ray when they're available. You have a chest x-ray done today at the [place] office.  You need to increase your leg raises daily to try and help the fluid in your legs decrease. Continue to restrict salt in your diet.  Take your Lasix twice in the morning as usual, and take 2 Lasix in the evening as well. You'll now be taking 2 in the morning and 2 in the evening. This may help decrease your edema as well.  Followup in one week with your primary care provider to reassess. Followup sooner if symptoms worsen or fail to improve despite treatment.   Edema Edema is a buildup of fluids. It is most common in the feet, ankles, and legs. This happens more as a person ages. It may affect one or both legs. HOME CARE   Raise (elevate) the legs or ankles above the level of the heart while lying down.  Avoid sitting or standing still for a long time.  Exercise the legs to help the puffiness (swelling) go down.  A low-salt diet may help lessen the puffiness.  Only take medicine as told by your doctor. GET HELP RIGHT AWAY IF:   You develop shortness of breath or chest pain.  You cannot breathe when you lie down.  You have more puffiness that does not go away with treatment.  You develop pain or redness in the areas that are puffy.  You have a temperature by mouth above 102 F (38.9 C), not controlled by medicine.  You gain 03 lb/1.4 kg or more in 1 day or 05 lb/2.3 kg in a week. MAKE SURE YOU:   Understand these instructions.  Will watch your condition.  Will get help right away if you are not doing well or get worse. Document Released: 06/24/2007 Document Revised: 03/30/2011 Document Reviewed: 06/24/2007 [facility] Patient Information 2014 [place], [place].

## 2013-06-16 ENCOUNTER — Telehealth: Payer: Self-pay | Admitting: Internal Medicine

## 2013-06-16 NOTE — Telephone Encounter (Signed)
Patient Information:  Caller Name: Mani  Phone: [phone]  Patient: [name]  Gender: Male  DOB: [birth year]  Age: 80 Years  PCP: Donell Beers  Office Follow Up:  Does the office need to follow up with this patient?: Yes  Instructions For The Office: Please follow up with patient after note review at [phone].  RN Note:  Patient states that he was seen in the office for COPD exacerbation and lower extremity edema. Breathing is much improved and he feels great and wants to do things however his legs are tight with swelling above the knee with the left leg and below the knee with the right leg.  Patient is taking his Lasix as indicated, made some diet modifications and is no longer eating alot of chips, and is propping his feet up more.  Left leg has some weeping noted.  No improvement to lower extremitiy edema with change in Lasix and has worsened per patient however not a significant change.  Spoke with Samara Deist in office due to ED or office with PCP approval disposition and instructed to send note and they would follow up with patient after Dr. Pricilla Holm reviews note.  Patient informed and will await call.  Preferred number is [phone].  Symptoms  Reason For Call & Symptoms: Increased swelling to legs  Reviewed Health History In EMR: Yes  Reviewed Medications In EMR: Yes  Reviewed Allergies In EMR: Yes  Reviewed Surgeries / Procedures: Yes  Date of Onset of Symptoms: 06/14/2013  Treatments Tried: Increased Lasix from 2 in the morning and one in the evening to 2 in the morning and 2 in the evening.  Treatments Tried Worked: No  Guideline(s) Used:  Leg Swelling and Edema  Disposition Per Guideline:   Go to ED Now (or to Office with PCP Approval)  Reason For Disposition Reached:   Severe swelling (e.g., swelling extends above knee, entire leg is swollen, weeping fluid)  Advice Given:  Call Back If:  Swelling becomes worse  You become worse.  Patient Will Follow  Care Advice:  YES

## 2013-06-17 ENCOUNTER — Ambulatory Visit (INDEPENDENT_AMBULATORY_CARE_PROVIDER_SITE_OTHER): Payer: Medicare Other | Admitting: Family Medicine

## 2013-06-17 ENCOUNTER — Encounter: Payer: Self-pay | Admitting: Family Medicine

## 2013-06-17 ENCOUNTER — Telehealth: Payer: Self-pay | Admitting: Internal Medicine

## 2013-06-17 VITALS — BP 120/86 | Temp 98.6°F | Wt 227.0 lb

## 2013-06-17 DIAGNOSIS — I10 Essential (primary) hypertension: Secondary | ICD-10-CM

## 2013-06-17 DIAGNOSIS — I509 Heart failure, unspecified: Secondary | ICD-10-CM

## 2013-06-17 DIAGNOSIS — I5022 Chronic systolic (congestive) heart failure: Secondary | ICD-10-CM

## 2013-06-17 DIAGNOSIS — I872 Venous insufficiency (chronic) (peripheral): Secondary | ICD-10-CM

## 2013-06-17 DIAGNOSIS — I5032 Chronic diastolic (congestive) heart failure: Secondary | ICD-10-CM

## 2013-06-17 NOTE — Progress Notes (Signed)
   Subjective:    Patient ID: [name], male    DOB: 05-09-1931, 80 y.o.   MRN: [id number]  HPI [name] is a 80 year old male who has multiple medical problems including interstitial lung disease diastolic heart failure hypertension rheumatoid arthritis chronic venous insufficiency who comes in today for evaluation of venous insufficiency worse in his left leg and his right. [facility] by [facility] and has had his medications adjusted. He's currently taking Lasix 80 mg the morning and 40 mg at noon. Recent be met a couple days ago sugar potassium level 4.0  He's not on potassium supplement.  He has full leg stockings but is not using them.  He's not having symptoms of congestive heart failure specifically no chest pain shortness of breath et Ronney Asters.   Review of Systems Review of systems very positive     Objective:   Physical Exam Well-developed well-nourished overweight male not in acute distress examination of left lower extremity shows chronic venous stasis on the left worse than the right. Pulses not palpable but he has fairly good capillary filling.       Assessment & Plan:   chronic venous insufficiency worse on the left than the right         Complete bed rest for the next 3 days followup by Dr. Carlean Jews.  Tuesday at 11:15 since she is his regular physician

## 2013-06-17 NOTE — Patient Instructions (Addendum)
Stay at complete bedrest except to get up to go the bathroom and eat  Daily weights Lasix 40 mg..... 2 in the morning and 2 at 2 PM  See Dr. Elbert Ewings. Tuesday...........Marland Kitchen 11:15 AM

## 2013-06-17 NOTE — Progress Notes (Signed)
Pre visit review using our clinic review tool, if applicable. No additional management support is needed unless otherwise documented below in the visit note. 

## 2013-06-19 NOTE — Telephone Encounter (Signed)
[facility] Triage Call Report Triage Record Num: [id number] Operator: Caswell Corwin Patient Name: [name] Call Date & Time: 06/17/2013 10:43:53AM Patient Phone: [phone] PCP: Donell Beers Patient Gender: Male PCP Fax : [phone] Patient DOB: 10-17-1931 [facility] Name: [facility] Reason for Call: Caller: Jaylyn/Patient; PCP: Donell Beers; CB#: [phone]; Call regarding Legs swelling/oozing and the left leg is larger than the right . Sx started about 3 weeks ago 05/27/13 after taking an antibiotic for his lungs. Both legs are oozing. Triaged Edema, Atraumatic and edema has decreased slightly. Pt's weight went from 211 to 225 lbs in the past 3 weeks. . Per EPIC note 06/16/13 pt needs to be seen, but he states no one called him. Yes to pt needs to be seen in 24 hrs, but per nursing judgement pt needs to be seen today. Appt made for 1200 today at [facility] clinic. Pt requested this time. Protocol(s) Used: Edema, Atraumatic Recommended Outcome per Protocol: See Provider within 24 hours Reason for Outcome: Known heart failure (HF) and new or increasing swelling of extremity(ies) Care Advice: ~ 05/

## 2013-06-20 ENCOUNTER — Ambulatory Visit (INDEPENDENT_AMBULATORY_CARE_PROVIDER_SITE_OTHER): Payer: Medicare Other | Admitting: Internal Medicine

## 2013-06-20 ENCOUNTER — Other Ambulatory Visit (INDEPENDENT_AMBULATORY_CARE_PROVIDER_SITE_OTHER): Payer: Medicare Other

## 2013-06-20 ENCOUNTER — Encounter: Payer: Self-pay | Admitting: Internal Medicine

## 2013-06-20 VITALS — BP 122/72 | HR 90 | Temp 98.8°F | Wt 223.8 lb

## 2013-06-20 DIAGNOSIS — I5033 Acute on chronic diastolic (congestive) heart failure: Secondary | ICD-10-CM

## 2013-06-20 DIAGNOSIS — I4891 Unspecified atrial fibrillation: Secondary | ICD-10-CM

## 2013-06-20 DIAGNOSIS — R7309 Other abnormal glucose: Secondary | ICD-10-CM

## 2013-06-20 DIAGNOSIS — I482 Chronic atrial fibrillation, unspecified: Secondary | ICD-10-CM

## 2013-06-20 LAB — CBC WITH DIFFERENTIAL/PLATELET
BASOS ABS: 0.1 10*3/uL (ref 0.0–0.1)
Basophils Relative: 0.6 % (ref 0.0–3.0)
EOS ABS: 0.4 10*3/uL (ref 0.0–0.7)
EOS PCT: 3.9 % (ref 0.0–5.0)
HCT: 36.8 % — ABNORMAL LOW (ref 39.0–52.0)
Hemoglobin: 12.2 g/dL — ABNORMAL LOW (ref 13.0–17.0)
Lymphocytes Relative: 13.3 % (ref 12.0–46.0)
Lymphs Abs: 1.3 10*3/uL (ref 0.7–4.0)
MCHC: 33 g/dL (ref 30.0–36.0)
MCV: 96.1 fl (ref 78.0–100.0)
MONO ABS: 1.1 10*3/uL — AB (ref 0.1–1.0)
Monocytes Relative: 10.7 % (ref 3.0–12.0)
NEUTROS PCT: 71.5 % (ref 43.0–77.0)
Neutro Abs: 7.1 10*3/uL (ref 1.4–7.7)
PLATELETS: 380 10*3/uL (ref 150.0–400.0)
RBC: 3.83 Mil/uL — ABNORMAL LOW (ref 4.22–5.81)
RDW: 15.9 % — AB (ref 11.5–15.5)
WBC: 10 10*3/uL (ref 4.0–10.5)

## 2013-06-20 LAB — HEPATIC FUNCTION PANEL
ALT: 17 U/L (ref 0–53)
AST: 16 U/L (ref 0–37)
Albumin: 3.3 g/dL — ABNORMAL LOW (ref 3.5–5.2)
Alkaline Phosphatase: 74 U/L (ref 39–117)
BILIRUBIN TOTAL: 0.7 mg/dL (ref 0.2–1.2)
Bilirubin, Direct: 0.2 mg/dL (ref 0.0–0.3)
Total Protein: 6.4 g/dL (ref 6.0–8.3)

## 2013-06-20 LAB — TSH: TSH: 3.71 u[IU]/mL (ref 0.35–4.50)

## 2013-06-20 LAB — BASIC METABOLIC PANEL
BUN: 11 mg/dL (ref 6–23)
CHLORIDE: 100 meq/L (ref 96–112)
CO2: 30 mEq/L (ref 19–32)
CREATININE: 1 mg/dL (ref 0.4–1.5)
Calcium: 8.8 mg/dL (ref 8.4–10.5)
GFR: 76.49 mL/min (ref >60.00)
GLUCOSE: 95 mg/dL (ref 70–99)
Potassium: 4.3 mEq/L (ref 3.5–5.1)
Sodium: 139 mEq/L (ref 135–145)

## 2013-06-20 LAB — HEMOGLOBIN A1C: HEMOGLOBIN A1C: 6.2 % (ref 4.6–6.5)

## 2013-06-20 MED ORDER — FUROSEMIDE 80 MG PO TABS: 80.0000 mg | ORAL_TABLET | Freq: Two times a day (BID) | ORAL | Status: DC

## 2013-06-20 NOTE — Patient Instructions (Signed)
It was good to see you today.  We have reviewed your prior records including labs and tests today  Test(s) ordered today. Your results will be released to MyChart (or called to you) after review, usually within 72hours after test completion. If any changes need to be made, you will be notified at that same time.  Medications reviewed and updated, no changes recommended at this time. Refill on medication(s) as discussed today.  we'll make referral for echocardiogram and appointment with Dr [place]. Our office will contact you regarding appointment(s) once made.  Please schedule followup in 3-4 weeks (unless seen by Dr Jordan/cardiology before then); call sooner if problems.

## 2013-06-20 NOTE — Assessment & Plan Note (Signed)
Last echo 10/2011: Severe LV hypertrophy with normal EF. Restrictive filling pattern consistent with diastolic dysfunction  Increase edema BLE since mid 05/2013 following tx of COPD exac; breathing now back to baseline  Edema improved in past 72 h with increase lasix to 80 bid - but not   Not taking dilt for last 72h because of potential side effects causing edema  recheck echo and labs now  rx for standing lasix 80 bid pending these results and follow up with cards this month as planned

## 2013-06-20 NOTE — Progress Notes (Signed)
Subjective:    Patient ID: [name], male    DOB: 1931-05-26, 80 y.o.   MRN: [id number]  HPI  Patient is here for follow up  Reviewed chronic medical issues and interval medical events  Past Medical History  Diagnosis Date  . Atrial flutter     s/p ablation for RVR  03/2009, pradaxa ongoing  . VENOUS INSUFFICIENCY, LEFT LEG   . PULMONARY FIBROSIS, INTERSTITIAL   . OBSTRUCTIVE SLEEP APNEA   . BENIGN POSITIONAL VERTIGO   . HYPERGLYCEMIA 2011  . Interstitial lung disease   . ALLERGIC RHINITIS   . Rheumatoid arthritis(714.0)   . Chronic diastolic CHF (congestive heart failure)   . PERIPHERAL NEUROPATHY   . HYPERTENSION   . HYPERLIPIDEMIA   . DEPRESSION     [facility] hosp 01/2011 for SI  . Anxiety   . Morbid obesity   . Diverticulosis of colon (without mention of hemorrhage)     Review of Systems  Constitutional: Positive for fatigue and unexpected weight change. Negative for fever.  Respiratory: Negative for cough, choking, chest tightness and shortness of breath.   Cardiovascular: Positive for leg swelling (BLE x 3 weeks). Negative for chest pain and palpitations.  Skin: Negative for color change, rash and wound.  Neurological: Negative for numbness.       Objective:   Physical Exam  BP 122/72  Pulse 90  Temp(Src) 98.8 F (37.1 C) (Oral)  Wt 223 lb 12.8 oz (101.515 kg)  SpO2 98% Wt Readings from Last 3 Encounters:  06/20/13 223 lb 12.8 oz (101.515 kg)  06/17/13 227 lb (102.967 kg)  06/14/13 224 lb (101.606 kg)   Constitutional:  He appears well-developed and well-nourished. No distress. Resting lip smacking. HENT: NCAT, sinuses non tender, TMs hazy but no redness or effusion - no cerumen - OP clear Neck: Normal range of motion. Neck supple. No LAD or JVD present. No thyromegaly present.  Cardiovascular: Normal rate, irregular rhythm and normal heart sounds.  No murmur heard. 2+ pitting BLE edema feet to knees Pulmonary/Chest: Effort normal and breath sounds  without wheeze or rhonchi. No respiratory distress.Marland Kitchen  Psychiatric: he has an anxious and dysphoric mood and affect. Perseverating, but behavior is normal. Judgment and thought content normal.   Lab Results  Component Value Date   WBC 9.5 11/04/2011   HGB 12.0* 11/04/2011   HCT 37.2* 11/04/2011   PLT 280.0 11/04/2011   GLUCOSE 92 06/14/2013   CHOL 171 09/21/2012   TRIG 107.0 09/21/2012   HDL 27.20* 09/21/2012   LDLCALC 122* 09/21/2012   ALT 30 10/01/2010   AST 18 10/01/2010   NA 140 06/14/2013   K 4.0 06/14/2013   CL 100 06/14/2013   CREATININE 1.2 06/14/2013   BUN 16 06/14/2013   CO2 30 06/14/2013   TSH 2.74 11/04/2011   PSA 5.04* 09/26/2010   INR 1.14 09/19/2010   HGBA1C 6.6* 09/21/2012    Dg Chest 2 View  06/14/2013   CLINICAL DATA:  Cough and congestion  EXAM: CHEST  2 VIEW  COMPARISON:  CT 09/30/2012, chest x-ray 09/21/2012  FINDINGS: Stable enlarged cardiac silhouette. There is the peripheral linear nodule interstitial opacities within both lungs which is not significantly changed from prior. No focal consolidation. No pneumothorax. Degenerative osteophytosis of the thoracic spine.  IMPRESSION: Chronic fibrotic interstitial lung disease unchanged from comparison exams.   Electronically Signed   By: Genevive Bi M.D.   On: 06/14/2013 14:37       Assessment &  Plan:   Problem List Items Addressed This Visit   Acute on chronic diastolic heart failure - Primary     Last echo 10/2011: Severe LV hypertrophy with normal EF. Restrictive filling pattern consistent with diastolic dysfunction  Increase edema BLE since mid 05/2013 following tx of COPD exac; breathing now back to baseline  Edema improved in past 72 h with increase lasix to 80 bid - but not   Not taking dilt for last 72h because of potential side effects causing edema  recheck echo and labs now  rx for standing lasix 80 bid pending these results and follow up with cards this month as planned    Relevant Medications       furosemide (LASIX) tablet   Other Relevant Orders      2D Echocardiogram without contrast      Basic metabolic panel      CBC with Differential      TSH      Hepatic function panel      Ambulatory referral to Cardiology   Chronic a-fib     anticoag with Pradaxa off beta-blocker due to bradycardia 01/2011 Off amio due to pulm fibrosis Stop Dilt now due to edema continue Dig and follows with cards as scheduled    Relevant Medications      furosemide (LASIX) tablet   Other Relevant Orders      2D Echocardiogram without contrast      Basic metabolic panel      CBC with Differential      TSH      Hepatic function panel      Ambulatory referral to Cardiology   HYPERGLYCEMIA      Random hyperglycemia on labs with cards fall 2011 -  previously on low dose chronic pred for RA check a1c q6-76mo due to FH DM but pt working on weight control to manage same Lab Results  Component Value Date   HGBA1C 6.6* 09/21/2012      Relevant Orders      Hemoglobin A1c

## 2013-06-20 NOTE — Progress Notes (Signed)
Pre visit review using our clinic review tool, if applicable. No additional management support is needed unless otherwise documented below in the visit note. 

## 2013-06-20 NOTE — Assessment & Plan Note (Signed)
anticoag with Pradaxa off beta-blocker due to bradycardia 01/2011 Off amio due to pulm fibrosis Stop Dilt now due to edema continue Dig and follows with cards as scheduled

## 2013-06-20 NOTE — Assessment & Plan Note (Signed)
Random hyperglycemia on labs with cards fall 2011 -  previously on low dose chronic pred for RA check a1c q6-12mo due to FH DM but pt working on weight control to manage same Lab Results  Component Value Date   HGBA1C 6.6* 09/21/2012

## 2013-06-21 ENCOUNTER — Telehealth: Payer: Self-pay | Admitting: Internal Medicine

## 2013-06-21 NOTE — Telephone Encounter (Signed)
No indication for hosp bed at this time  Labs normal -  follow up for echo as ordered yesterday (check with [facility] as to when scheduled)

## 2013-06-21 NOTE — Telephone Encounter (Signed)
Patient wants to know if he can have an order for a hospital bed due to the ongoing condition of his legs. He says that this would help him keep his legs elevated.  Patient is also wanting lab results from yesterday. Please advise.

## 2013-06-21 NOTE — Telephone Encounter (Signed)
Called pt no answer LMOM with md response.../lmb 

## 2013-06-22 ENCOUNTER — Ambulatory Visit: Payer: Self-pay | Admitting: Physician Assistant

## 2013-06-29 NOTE — Progress Notes (Signed)
Patient ID: [name], male   DOB: May 16, 1931, 80 y.o.   MRN: [id number] Xarelto was placed up front on 5.15.15 and placed back in closet on 06/29/13

## 2013-07-07 ENCOUNTER — Other Ambulatory Visit: Payer: Self-pay | Admitting: *Deleted

## 2013-07-07 MED ORDER — OMEPRAZOLE 20 MG PO CPDR: 20.0000 mg | DELAYED_RELEASE_CAPSULE | Freq: Two times a day (BID) | ORAL | Status: DC

## 2013-07-12 ENCOUNTER — Telehealth: Payer: Self-pay | Admitting: Cardiology

## 2013-07-12 NOTE — Telephone Encounter (Signed)
Returned call to patient he stated he saw a commercial on tv saying xarelto is bad,can cause excessive bleeding.Stated he has not noticed any bleeding.Advised ok to continue xarelto.Advised if he notices any bleeding to call back.

## 2013-07-12 NOTE — Telephone Encounter (Signed)
New Message:  Pt is calling in reference to xeralto Rx... States he saw some information on the TV... He has some questions about his medication and wants to speak with the nurse.Nicholas KitchenMarland Moran

## 2013-07-19 ENCOUNTER — Ambulatory Visit: Payer: Medicare Other | Admitting: Internal Medicine

## 2013-07-19 ENCOUNTER — Telehealth: Payer: Self-pay | Admitting: Internal Medicine

## 2013-07-19 DIAGNOSIS — Z0289 Encounter for other administrative examinations: Secondary | ICD-10-CM

## 2013-07-19 NOTE — Telephone Encounter (Signed)
Your pt did not show up for the appt today. Please advise.  °

## 2013-07-20 NOTE — Telephone Encounter (Signed)
Left vm for pt to call back and schedule an appt

## 2013-07-20 NOTE — Telephone Encounter (Signed)
Thanks Please contact him to reschedule - would like to see him in next 2 weeks (me or a colleague) for follow up on CHF

## 2013-07-27 ENCOUNTER — Other Ambulatory Visit: Payer: Self-pay

## 2013-07-27 ENCOUNTER — Telehealth: Payer: Self-pay | Admitting: Internal Medicine

## 2013-07-27 DIAGNOSIS — I5043 Acute on chronic combined systolic (congestive) and diastolic (congestive) heart failure: Secondary | ICD-10-CM

## 2013-07-27 MED ORDER — DILTIAZEM HCL ER COATED BEADS 180 MG PO CP24: 180.0000 mg | ORAL_CAPSULE | Freq: Every day | ORAL | Status: DC

## 2013-07-27 NOTE — Telephone Encounter (Signed)
Refill request for Diltiazem HCL

## 2013-07-27 NOTE — Telephone Encounter (Signed)
2nd rx request for diltiazem recvd..the first was recvd by pharm.

## 2013-07-27 NOTE — Telephone Encounter (Signed)
Patient called regarding previous request for hospital bed (see phone note from 06/21/13). He has sold his bed and is now sleeping on the couch. He states he needs to have his legs elevated at a 30 degree angle to help with his severe swelling. Please advise.

## 2013-07-28 NOTE — Telephone Encounter (Signed)
Called pt no answer LMOM with md response.../lmb 

## 2013-07-28 NOTE — Telephone Encounter (Signed)
Via order for home health, I have asked for same Please let pt know to expect visit from [facility] to help [place] arrange for bed

## 2013-08-07 ENCOUNTER — Ambulatory Visit (HOSPITAL_COMMUNITY)
Admission: RE | Admit: 2013-08-07 | Discharge: 2013-08-07 | Disposition: A | Discharge status: Home / Self Care | Payer: Medicare Other | Source: Ambulatory Visit | Attending: Cardiovascular Disease | Admitting: Cardiovascular Disease

## 2013-08-07 DIAGNOSIS — I509 Heart failure, unspecified: Secondary | ICD-10-CM

## 2013-08-07 DIAGNOSIS — I4891 Unspecified atrial fibrillation: Secondary | ICD-10-CM | POA: Insufficient documentation

## 2013-08-07 DIAGNOSIS — I5033 Acute on chronic diastolic (congestive) heart failure: Secondary | ICD-10-CM | POA: Insufficient documentation

## 2013-08-07 DIAGNOSIS — I059 Rheumatic mitral valve disease, unspecified: Secondary | ICD-10-CM

## 2013-08-07 DIAGNOSIS — I5022 Chronic systolic (congestive) heart failure: Secondary | ICD-10-CM

## 2013-08-07 DIAGNOSIS — I482 Chronic atrial fibrillation, unspecified: Secondary | ICD-10-CM

## 2013-08-07 NOTE — Progress Notes (Signed)
2D Echocardiogram Complete.  08/07/2013   Gilbert Narain, RDCS  

## 2013-08-16 ENCOUNTER — Ambulatory Visit (INDEPENDENT_AMBULATORY_CARE_PROVIDER_SITE_OTHER): Payer: Medicare Other | Admitting: Internal Medicine

## 2013-08-16 ENCOUNTER — Other Ambulatory Visit (INDEPENDENT_AMBULATORY_CARE_PROVIDER_SITE_OTHER): Payer: Medicare Other

## 2013-08-16 ENCOUNTER — Encounter: Payer: Self-pay | Admitting: Internal Medicine

## 2013-08-16 VITALS — BP 122/70 | HR 70 | Temp 97.9°F | Ht 68.0 in | Wt 216.5 lb

## 2013-08-16 DIAGNOSIS — I4891 Unspecified atrial fibrillation: Secondary | ICD-10-CM

## 2013-08-16 DIAGNOSIS — E785 Hyperlipidemia, unspecified: Secondary | ICD-10-CM

## 2013-08-16 DIAGNOSIS — I5032 Chronic diastolic (congestive) heart failure: Secondary | ICD-10-CM

## 2013-08-16 DIAGNOSIS — I482 Chronic atrial fibrillation, unspecified: Secondary | ICD-10-CM

## 2013-08-16 DIAGNOSIS — J84111 Idiopathic interstitial pneumonia, not otherwise specified: Secondary | ICD-10-CM

## 2013-08-16 LAB — LIPID PANEL
Cholesterol: 172 mg/dL (ref 0–200)
HDL: 31.8 mg/dL — ABNORMAL LOW (ref >39.00)
LDL CALC: 125 mg/dL — AB (ref 0–99)
NonHDL: 140.2
Total CHOL/HDL Ratio: 5
Triglycerides: 77 mg/dL (ref 0.0–149.0)
VLDL: 15.4 mg/dL (ref 0.0–40.0)

## 2013-08-16 LAB — BASIC METABOLIC PANEL
BUN: 16 mg/dL (ref 6–23)
CHLORIDE: 102 meq/L (ref 96–112)
CO2: 26 mEq/L (ref 19–32)
Calcium: 8.8 mg/dL (ref 8.4–10.5)
Creatinine, Ser: 0.9 mg/dL (ref 0.4–1.5)
GFR: 87.46 mL/min (ref >60.00)
Glucose, Bld: 125 mg/dL — ABNORMAL HIGH (ref 70–99)
Potassium: 4 mEq/L (ref 3.5–5.1)
Sodium: 137 mEq/L (ref 135–145)

## 2013-08-16 NOTE — Assessment & Plan Note (Signed)
?  IPF - needs pulm follow up - will arrange same

## 2013-08-16 NOTE — Assessment & Plan Note (Signed)
Not on statin - omega 3 daily reviewed Check lipids annually, titrate meds as needed

## 2013-08-16 NOTE — Progress Notes (Signed)
Pre visit review using our clinic review tool, if applicable. No additional management support is needed unless otherwise documented below in the visit note. 

## 2013-08-16 NOTE — Patient Instructions (Signed)
It was good to see you today.  We have reviewed your prior records including labs and tests today  Test(s) ordered today. Your results will be released to MyChart (or called to you) after review, usually within 72hours after test completion. If any changes need to be made, you will be notified at that same time.  Medications reviewed and updated, no changes recommended at this time.  we'll make referral to followup with Dr. [name] . Our office will contact you regarding appointment(s) once made.  prescription given to you for new walker. Please take to any medical supply company  Please schedule followup in 3-4 months, call sooner if problems.

## 2013-08-16 NOTE — Assessment & Plan Note (Signed)
echo 10/2011: Severe LV hypertrophy with normal EF. Restrictive filling pattern consistent with diastolic dysfunction no significant change on 07/2013 echo  Acute exac 05/2013 following tx of COPD exac; breathing now back to baseline and edema resolved recheck labs now continue lasix 80 bid pending follow up with cards as planned

## 2013-08-16 NOTE — Progress Notes (Signed)
Subjective:    Patient ID: [name], male    DOB: 19-Feb-1931, 80 y.o.   MRN: [id number]  HPI  Patient here for follow up Reviewed chronic medical issues and interval medical events  Past Medical History  Diagnosis Date  . Atrial flutter     s/p ablation for RVR  03/2009, pradaxa ongoing  . VENOUS INSUFFICIENCY, LEFT LEG   . PULMONARY FIBROSIS, INTERSTITIAL   . OBSTRUCTIVE SLEEP APNEA   . BENIGN POSITIONAL VERTIGO   . HYPERGLYCEMIA 2011  . Interstitial lung disease   . ALLERGIC RHINITIS   . Rheumatoid arthritis(714.0)   . Chronic diastolic CHF (congestive heart failure)   . PERIPHERAL NEUROPATHY   . HYPERTENSION   . HYPERLIPIDEMIA   . DEPRESSION     BH hosp 01/2011 for SI  . Anxiety   . Morbid obesity   . Diverticulosis of colon (without mention of hemorrhage)     Review of Systems  Constitutional: Negative for fatigue and unexpected weight change (down 10# with diuresis).  Respiratory: Positive for shortness of breath (chronic). Negative for cough and chest tightness.   Cardiovascular: Negative for chest pain, palpitations and leg swelling (improved with lasix bid x 1 mo).       Objective:   Physical Exam  BP 122/70  Pulse 70  Temp(Src) 97.9 F (36.6 C) (Oral)  Ht 5\' 8"  (1.727 m)  Wt 216 lb 8 oz (98.204 kg)  BMI 32.93 kg/m2  SpO2 93% Wt Readings from Last 3 Encounters:  08/16/13 216 lb 8 oz (98.204 kg)  06/20/13 223 lb 12.8 oz (101.515 kg)  06/17/13 227 lb (102.967 kg)   Constitutional: he is slightly disheveled (baseline), appears well-developed and well-nourished. No acute distress.  Neck: Normal range of motion. Neck supple. No JVD present. No thyromegaly present.  Cardiovascular: Normal rate, irregular rhythm and normal heart sounds.  No murmur heard. resolved BLE edema. Pulmonary/Chest: Effort normal and breath sounds diminished at bases. No respiratory distress. he has no wheezes but fine B crackle.  Psychiatric: he has a perseverating manner,  slightly dysphoric mood and affect. His behavior is appropriate  Judgment and thought content normal.   Lab Results  Component Value Date   WBC 10.0 06/20/2013   HGB 12.2* 06/20/2013   HCT 36.8* 06/20/2013   PLT 380.0 06/20/2013   GLUCOSE 95 06/20/2013   CHOL 171 09/21/2012   TRIG 107.0 09/21/2012   HDL 27.20* 09/21/2012   LDLCALC 122* 09/21/2012   ALT 17 06/20/2013   AST 16 06/20/2013   NA 139 06/20/2013   K 4.3 06/20/2013   CL 100 06/20/2013   CREATININE 1.0 06/20/2013   BUN 11 06/20/2013   CO2 30 06/20/2013   TSH 3.71 06/20/2013   PSA 5.04* 09/26/2010   INR 1.14 09/19/2010   HGBA1C 6.2 06/20/2013    No results found.     Assessment & Plan:   Problem List Items Addressed This Visit   Chronic a-fib     Rate controlled anticoag with Pradaxa off beta-blocker due to bradycardia 01/2011 Off amio due to pulm fibrosis continue dilt and Dig for rate control - follow up with cards as scheduled     Relevant Orders      DME Other see comment   Chronic diastolic heart failure - Primary     echo 10/2011: Severe LV hypertrophy with normal EF. Restrictive filling pattern consistent with diastolic dysfunction no significant change on 07/2013 echo  Acute exac 05/2013 following tx of  COPD exac; breathing now back to baseline and edema resolved recheck labs now continue lasix 80 bid pending follow up with cards as planned    Relevant Orders      Basic metabolic panel      DME Other see comment   HYPERLIPIDEMIA     Not on statin - omega 3 daily reviewed Check lipids annually, titrate meds as needed    Relevant Orders      Lipid panel   Idiopathic interstitial pneumonia     ?IPF - needs pulm follow up - will arrange same    Relevant Orders      Ambulatory referral to Pulmonology      DME Other see comment

## 2013-08-16 NOTE — Assessment & Plan Note (Signed)
Rate controlled anticoag with Pradaxa off beta-blocker due to bradycardia 01/2011 Off amio due to pulm fibrosis continue dilt and Dig for rate control - follow up with cards as scheduled

## 2013-08-17 ENCOUNTER — Telehealth: Payer: Self-pay | Admitting: *Deleted

## 2013-08-17 DIAGNOSIS — I5032 Chronic diastolic (congestive) heart failure: Secondary | ICD-10-CM

## 2013-08-17 DIAGNOSIS — I482 Chronic atrial fibrillation, unspecified: Secondary | ICD-10-CM

## 2013-08-17 NOTE — Telephone Encounter (Signed)
Spoke with pt to advise of lab results.  Pt requests refill on Prednisone 5mg  he states he only has 2 tablets left.  Medication is not on current med list.  Please advise refill

## 2013-08-17 NOTE — Telephone Encounter (Signed)
Patient called back and asks that we not send the wheelchair order to the fax number he has given anymore. He wants to call [facility] to see if he can get one from them. He will call us back with more information.

## 2013-08-17 NOTE — Telephone Encounter (Signed)
Left msg on triage stating md gave rx for pt to have a 5 inch rolling walker. Pt is wanting a 4 wheel walker with feet. If ok with md need new order fax to [phone]...Raechel Chute

## 2013-08-17 NOTE — Telephone Encounter (Signed)
No pred refill (we discussed this at OV) - thanks

## 2013-08-17 NOTE — Telephone Encounter (Signed)
Spoke with pt advised of MDs message 

## 2013-08-17 NOTE — Telephone Encounter (Signed)
Ok to generate rx and i will sign

## 2013-08-21 ENCOUNTER — Other Ambulatory Visit: Payer: Self-pay

## 2013-08-21 DIAGNOSIS — H8113 Benign paroxysmal vertigo, bilateral: Secondary | ICD-10-CM

## 2013-08-21 NOTE — Telephone Encounter (Signed)
Revised scripted for walker printed, signed and faxed back to [facility].

## 2013-08-25 ENCOUNTER — Ambulatory Visit (INDEPENDENT_AMBULATORY_CARE_PROVIDER_SITE_OTHER): Payer: Medicare Other | Admitting: Pulmonary Disease

## 2013-08-25 ENCOUNTER — Encounter: Payer: Self-pay | Admitting: Pulmonary Disease

## 2013-08-25 VITALS — BP 114/70 | HR 75 | Temp 98.0°F | Ht 68.0 in | Wt 214.8 lb

## 2013-08-25 DIAGNOSIS — G4730 Sleep apnea, unspecified: Secondary | ICD-10-CM

## 2013-08-25 DIAGNOSIS — J84111 Idiopathic interstitial pneumonia, not otherwise specified: Secondary | ICD-10-CM

## 2013-08-25 DIAGNOSIS — I5032 Chronic diastolic (congestive) heart failure: Secondary | ICD-10-CM

## 2013-08-25 NOTE — Assessment & Plan Note (Signed)
compliance with goal of at least 4-6 hrs every night is the expectation. Advised against medications with sedative side effects Cautioned against driving when sleepy - understanding that sleepiness will vary on a day to day basis  

## 2013-08-25 NOTE — Progress Notes (Signed)
Subjective:    Patient ID: [name], male    DOB: 10/04/1931, 80 y.o.   MRN: [id number]  HPI  PCP - Asa Lente  Rheum - [place]  Cards- [place]   80 year old male with pulmonary Fibrosis (UIP pattern), RA and OSA , chronic diastolic CHF.   Significant tests/ events  Admited 3/ 2011 with A Flutter/Fib and CHF exacerbation and needed ICU stay and amiodarone. CT showed evidence of diffuse pulmonary fibrosis. Cath 04/06/2009 normal coronaries.  PSG 8/11 showed - moderate OSA - AHI 22/h, lowest desatn 78% - corrected by cpaP 9 cm with med FF mask  ? persistent REM related desaturation on 9 cm related to underlying cardiopulmonary disease  October, 2011 ONO on CPAP - destauration x 9 mins only over total recording time x 10 h,  PFTs showed mild restriction, no obstruction   09/2012  BNP 172, ESR 35, RA 38, CCP neg  HRCT >> Chronic interstitial lung disease with mildly progressed peripheral reticulation and honeycombing in some areas since 2011. D/w dr Rosario Jacks - CT c/w UIP No response to Abx, Improved with prednisone >>tapered off    Ba swallow 10/20/12- Focal esophagitis just above the thoracic inlet suggesting Barrett's esophagus. there is no stricture. Diffuse esophageal motility disorder. Flash laryngeal penetration with swallowing.  Saw GI- back on PPI  COugh, dyspnea -stable    08/25/2013  Chief Complaint  Patient presents with  . Follow-up    Pt reports breathing is fair. Denies any chest tx. Occas wheezing, prod cough-clear to light yellow. Taking mucinex BID. Reports he wears CPAP all night, even with naps at times.    Uses walker His memory is failing His dyspnea appears or worse, but it is difficult to assess, since as ambulation is limited now. No flareup of arthritis. He reports good compliance with CPAP. He states that his urinating a lot with his current dose of Lasix-80 a.m. And 40 p.m.Colon [place] if he can decrease this dose.  Past Medical History  Diagnosis  Date  . Atrial flutter     s/p ablation for RVR  03/2009, pradaxa ongoing  . VENOUS INSUFFICIENCY, LEFT LEG   . PULMONARY FIBROSIS, INTERSTITIAL   . OBSTRUCTIVE SLEEP APNEA   . BENIGN POSITIONAL VERTIGO   . HYPERGLYCEMIA 2011  . Interstitial lung disease   . ALLERGIC RHINITIS   . Rheumatoid arthritis(714.0)   . Chronic diastolic CHF (congestive heart failure)   . PERIPHERAL NEUROPATHY   . HYPERTENSION   . HYPERLIPIDEMIA   . DEPRESSION     BH hosp 01/2011 for SI  . Anxiety   . Morbid obesity   . Diverticulosis of colon (without mention of hemorrhage)      Review of Systems neg for any significant sore throat, dysphagia, itching, sneezing, nasal congestion or excess/ purulent secretions, fever, chills, sweats, unintended wt loss, pleuritic or exertional cp, hempoptysis, orthopnea pnd or change in chronic leg swelling. Also denies presyncope, palpitations, heartburn, abdominal pain, nausea, vomiting, diarrhea or change in bowel or urinary habits, dysuria,hematuria, rash, arthralgias, visual complaints, headache, numbness weakness or ataxia.     Objective:   Physical Exam  Gen. Pleasant, well-nourished, in no distress, normal affect ENT - no lesions, no post nasal drip Neck: No JVD, no thyromegaly, no carotid bruits Lungs: no use of accessory muscles, no dullness to percussion, bibasal 1/3 rales, no rhonchi  Cardiovascular: Rhythm regular, heart sounds  normal, no murmurs or gallops, no peripheral edema Abdomen: soft and non-tender, no  hepatosplenomegaly, BS normal. Musculoskeletal: No deformities, no cyanosis or clubbing Neuro:  alert, non focal        Assessment & Plan:

## 2013-08-25 NOTE — Assessment & Plan Note (Signed)
We'll try to revisit whether pulmonary fibrosis is worsened by attending high-resolution CT scan of the chest and pulmonary function testing. Since it will response to prednisone in the past, we could consider another prednisone trial, if lung function appears worse. I doubt that we'll use anti-fibrotic agents such as perfenidone

## 2013-08-25 NOTE — Patient Instructions (Signed)
You have pulmonary fibrosis - may be related to rheumatoid CT scan of your lungs Breathing test

## 2013-08-25 NOTE — Assessment & Plan Note (Signed)
Since he is bothered by frequency of urination, and his pedal edema is under control, possibly decrease Lasix to 40 mg twice a day. We will reassess in 3 weeks.

## 2013-08-30 ENCOUNTER — Other Ambulatory Visit: Payer: Self-pay

## 2013-08-31 ENCOUNTER — Ambulatory Visit (INDEPENDENT_AMBULATORY_CARE_PROVIDER_SITE_OTHER)
Admission: RE | Admit: 2013-08-31 | Discharge: 2013-08-31 | Disposition: A | Discharge status: Home / Self Care | Payer: Medicare Other | Source: Ambulatory Visit | Attending: Pulmonary Disease | Admitting: Pulmonary Disease

## 2013-08-31 DIAGNOSIS — J84111 Idiopathic interstitial pneumonia, not otherwise specified: Secondary | ICD-10-CM

## 2013-09-13 ENCOUNTER — Ambulatory Visit (INDEPENDENT_AMBULATORY_CARE_PROVIDER_SITE_OTHER): Payer: Medicare Other | Admitting: Pulmonary Disease

## 2013-09-13 DIAGNOSIS — J84111 Idiopathic interstitial pneumonia, not otherwise specified: Secondary | ICD-10-CM

## 2013-09-13 LAB — PULMONARY FUNCTION TEST
DL/VA % pred: 99 %
DL/VA: 4.42 ml/min/mmHg/L
DLCO unc % pred: 56 %
DLCO unc: 16.75 ml/min/mmHg
FEF 25-75 Post: 3.08 L/sec
FEF 25-75 Pre: 2.51 L/sec
FEF2575-%CHANGE-POST: 22 %
FEF2575-%Pred-Post: 171 %
FEF2575-%Pred-Pre: 139 %
FEV1-%CHANGE-POST: 4 %
FEV1-%PRED-POST: 85 %
FEV1-%PRED-PRE: 82 %
FEV1-PRE: 2.15 L
FEV1-Post: 2.24 L
FEV1FVC-%Change-Post: 4 %
FEV1FVC-%Pred-Pre: 118 %
FEV6-%Change-Post: 0 %
FEV6-%Pred-Post: 73 %
FEV6-%Pred-Pre: 73 %
FEV6-PRE: 2.53 L
FEV6-Post: 2.52 L
FEV6FVC-%PRED-PRE: 107 %
FEV6FVC-%Pred-Post: 107 %
FVC-%Change-Post: 0 %
FVC-%PRED-PRE: 68 %
FVC-%Pred-Post: 68 %
FVC-Post: 2.52 L
FVC-Pre: 2.53 L
POST FEV1/FVC RATIO: 89 %
PRE FEV6/FVC RATIO: 100 %
Post FEV6/FVC ratio: 100 %
Pre FEV1/FVC ratio: 85 %
RV % PRED: 47 %
RV: 1.2 L
TLC % PRED: 59 %
TLC: 3.97 L

## 2013-09-13 NOTE — Progress Notes (Signed)
PFT done today. 

## 2013-09-15 ENCOUNTER — Encounter: Payer: Self-pay | Admitting: Pulmonary Disease

## 2013-09-15 ENCOUNTER — Ambulatory Visit (INDEPENDENT_AMBULATORY_CARE_PROVIDER_SITE_OTHER): Payer: Medicare Other | Admitting: Cardiology

## 2013-09-15 ENCOUNTER — Ambulatory Visit (INDEPENDENT_AMBULATORY_CARE_PROVIDER_SITE_OTHER): Payer: Medicare Other | Admitting: Pulmonary Disease

## 2013-09-15 ENCOUNTER — Encounter: Payer: Self-pay | Admitting: Cardiology

## 2013-09-15 VITALS — BP 126/64 | HR 80 | Ht 68.0 in | Wt 215.6 lb

## 2013-09-15 VITALS — BP 112/72 | HR 71 | Ht 68.0 in | Wt 216.0 lb

## 2013-09-15 DIAGNOSIS — I4892 Unspecified atrial flutter: Secondary | ICD-10-CM

## 2013-09-15 DIAGNOSIS — J84112 Idiopathic pulmonary fibrosis: Secondary | ICD-10-CM

## 2013-09-15 DIAGNOSIS — I10 Essential (primary) hypertension: Secondary | ICD-10-CM

## 2013-09-15 DIAGNOSIS — I5032 Chronic diastolic (congestive) heart failure: Secondary | ICD-10-CM

## 2013-09-15 DIAGNOSIS — I4891 Unspecified atrial fibrillation: Secondary | ICD-10-CM

## 2013-09-15 DIAGNOSIS — I484 Atypical atrial flutter: Secondary | ICD-10-CM

## 2013-09-15 DIAGNOSIS — I482 Chronic atrial fibrillation, unspecified: Secondary | ICD-10-CM

## 2013-09-15 NOTE — Addendum Note (Signed)
Addended by: Meda Klinefelter D on: 09/15/2013 05:42 PM   Modules accepted: Orders

## 2013-09-15 NOTE — Progress Notes (Signed)
[name] Date of Birth: Jan 31, 1931 Medical Record #[id number]  History of Present Illness: [name] is seen for a follow up visit. He has chronic atrial fib. Has had prior atrial flutter ablation. Remains on chronic anticoagulation with Pradaxa. He has a history of CHF related to tachycardia mediated CM and  diastolic heart failure. His other problems include venous insufficiency, interstitial pulmonary fibrosis, OSA, vertigo, RA, neuropathy, HTN, HLD, depression and anxiety.  On followup today he reports increased chest congestion last month associated with marked increase in edema. He was treated with antibiotics and increase lasix dose to 80 mg bid. Swelling improved. Echo was updated. He denies any palpitations. No chest pain. He has Chronic SOB. He complains of tendinitis in his feet that keeps him from standing any length of time   Current Outpatient Prescriptions on File Prior to Visit  Medication Sig Dispense Refill  . Ascorbic Acid (VITAMIN C) 500 MG tablet Take 500 mg by mouth daily.        . Cholecalciferol (VITAMIN D3) 1000 UNITS CAPS Take 1 capsule by mouth.       . Coral Calcium 1000 (390 CA) MG TABS Take 2 capsules by mouth.        . digoxin (LANOXIN) 0.125 MG tablet Take 1 tablet (0.125 mg total) by mouth daily.  90 tablet  3  . diltiazem (CARDIZEM CD) 180 MG 24 hr capsule Take 1 capsule (180 mg total) by mouth daily. I capsule PO daily  90 capsule  1  . gabapentin (NEURONTIN) 300 MG capsule TAKE 2 CAPSULES BY MOUTH AT BEDTIME  60 capsule  5  . guaiFENesin (MUCINEX) 600 MG 12 hr tablet Take 600 mg by mouth 2 (two) times daily.      Marland Kitchen HYDROcodone-acetaminophen (VICODIN) 5-500 MG per tablet Take 0.5-1 tablets by mouth every 6 (six) hours as needed.       . leflunomide (ARAVA) 10 MG tablet Take 1 tablet (10 mg total) by mouth daily.      . memantine (NAMENDA) 10 MG tablet TAKE (1) TABLET BY MOUTH ONCE DAILY.      . Multiple Vitamin (MULTIVITAMIN) tablet Take 1 tablet by mouth  daily.        . NON FORMULARY at bedtime. c pap      . OMEGA-3 1000 MG CAPS Take by mouth daily.      Marland Kitchen omeprazole (PRILOSEC) 20 MG capsule Take 1 capsule (20 mg total) by mouth 2 (two) times daily.  60 capsule  5  . Rivaroxaban (XARELTO) 20 MG TABS tablet Take 1 tablet (20 mg total) by mouth daily with supper.  30 tablet  6   No current facility-administered medications on file prior to visit.    Allergies  Allergen Reactions  . Methotrexate     REACTION: ONLY HAS ONE KIDNEY    Past Medical History  Diagnosis Date  . Atrial flutter     s/p ablation for RVR  03/2009, pradaxa ongoing  . VENOUS INSUFFICIENCY, LEFT LEG   . PULMONARY FIBROSIS, INTERSTITIAL   . OBSTRUCTIVE SLEEP APNEA   . BENIGN POSITIONAL VERTIGO   . HYPERGLYCEMIA 2011  . Interstitial lung disease   . ALLERGIC RHINITIS   . Rheumatoid arthritis(714.0)   . Chronic diastolic CHF (congestive heart failure)   . PERIPHERAL NEUROPATHY   . HYPERTENSION   . HYPERLIPIDEMIA   . DEPRESSION     [facility] hosp 01/2011 for SI  . Anxiety   . Morbid obesity   .  Diverticulosis of colon (without mention of hemorrhage)     Past Surgical History  Procedure Laterality Date  . 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 Shoulder surgery Right 09/2009  . Varicose vein surgery      Left Leg  . Radiofrequency ablation    . Colonoscopy w/ biopsies      History  Smoking status  . Former Smoker -- 1.00 packs/day for 3 years  . Types: Cigarettes  . Quit date: 01/20/2000  Smokeless tobacco  . Never Used    Comment: Single, lives alone. 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 His family contact would be his sister [name] [phone]    History  Alcohol Use No    Comment: Former    Family History  Problem Relation Age of Onset  . Heart attack Father 68  . Other Mother 12    Old age  . Colon cancer Neg Hx     Review of Systems: The review of  systems is per the HPI.  All other systems were reviewed and are negative.  Physical Exam: BP 126/64  Pulse 80  Ht 5\' 8"  (1.727 m)  Wt 215 lb 9.6 oz (97.796 kg)  BMI 32.79 kg/m2 Patient is a pleasant and in no acute distress. He is obese. Skin is warm and dry. Color is normal.  HEENT is unremarkable. Normocephalic/atraumatic. PERRL. Sclera are nonicteric. Neck is supple. No masses. No JVD. Lungs reveals few wheezes rhonchi. Cardiac exam shows an irregular rhythm. Rate is controlled. Abdomen is obese but soft. Extremities show 1-2+ edema on the left and trace on the right. Gait and ROM are intact. No gross neurologic deficits noted.   LABORATORY DATA:  Lab Results  Component Value Date   WBC 10.0 06/20/2013   HGB 12.2* 06/20/2013   HCT 36.8* 06/20/2013   PLT 380.0 06/20/2013   GLUCOSE 125* 08/16/2013   CHOL 172 08/16/2013   TRIG 77.0 08/16/2013   HDL 31.80* 08/16/2013   LDLCALC 125* 08/16/2013   ALT 17 06/20/2013   AST 16 06/20/2013   NA 137 08/16/2013   K 4.0 08/16/2013   CL 102 08/16/2013   CREATININE 0.9 08/16/2013   BUN 16 08/16/2013   CO2 26 08/16/2013   TSH 3.71 06/20/2013   PSA 5.04* 09/26/2010   INR 1.14 09/19/2010   HGBA1C 6.2 06/20/2013   Echo: Study Conclusions  - Left ventricle: There was mild concentric hypertrophy. Systolic function was normal. The estimated ejection fraction was in the range of 50% to 55%. Cannot exclude mild hypokinesis of the inferior myocardium. Doppler parameters are consistent with elevated mean left atrial filling pressure. - Mitral valve: There was mild regurgitation. - Left atrium: The atrium was moderately to severely dilated. - Right atrium: The atrium was moderately to severely dilated.  Ecg today: Afib rate 63 bpm. Lateral ST-T changes c/w ischemia. Repeat Ecg 1 minute later shows Atrial flutter with 2:1 conduction and rate 142 bpm. RBBB  Assessment / Plan:  1. Chronic diastolic heart failure -  Have stressed the importance of sodium restriction. He  appears to be back to baseline with higher dose of lasix- now 40 mg bid.  We'll continue with his current diuretic dose. I'll followup again in 6 months.  2. Chronic atrial fib - rate is controlled on digoxin and diltiazem.Episode of tachycardia c/w atrial flutter. He is not symptomatic and  tachycardia is not sustained. Would continue current therapy. I would not increase AV nodal blockade due to risk of bradycardia.  3. Chronic anticoagulation - on Pradaxa - no problems noted.   4. Interstitial lung disease/fibrosis - followed closely by pulmonary. Recent CT showed no interval change. Follow up with Dr. [name].

## 2013-09-15 NOTE — Progress Notes (Signed)
   Subjective:    Patient ID: [name], male    DOB: 1931/06/03, 80 y.o.   MRN: [id number]  HPI  PCP - Asa Lente  Rheum - [facility]  Cards- [place]   80 year old male with pulmonary Fibrosis (UIP pattern), RA and OSA , chronic diastolic CHF.   Significant tests/ events  Admited 3/ 2011 with A Flutter/Fib and CHF exacerbation and needed ICU stay and amiodarone. CT showed evidence of diffuse pulmonary fibrosis.  Cath 04/06/2009 normal coronaries.  PSG 8/11 showed - moderate OSA - AHI 22/h, lowest desatn 78% - corrected by cpaP 9 cm with med FF mask  ? persistent REM related desaturation on 9 cm related to underlying cardiopulmonary disease  October, 2011 ONO on CPAP - destauration x 9 mins only over total recording time x 10 h,   09/2012 -BNP 172, ESR 35, RA 38, CCP neg   No response to Abx, Improved with prednisone >>tapered off  Ba swallow 10/20/12- Focal esophagitis just above the thoracic inlet suggesting Barrett's esophagus. there is no stricture. Diffuse esophageal motility disorder. Flash laryngeal penetration with swallowing.    09/15/2013  Chief Complaint  Patient presents with  . Follow-up    Wears CPAP nightly. No complaints. Pt c/o increased breathing issues. Review CT and PFT.    On last visit >> decrease lasix CT >> Fibrosis c/w UIP  stable compared to  09/30/2012 (although significantly progressed when compared to  04/03/2009)  PFTs - FVC 68%, TLC 59%, DLCO 56% -   stable DLCO 51% in 09/2012 Uses walker  His memory is failing  His dyspnea appears or worse, but it is difficult to assess, since as ambulation is limited now. No flareup of arthritis.  He reports good compliance with CPAP.  Lasix dose was dropped to 40 mg twice daily and he seems to be doing okay with this   Review of Systems neg for any significant sore throat, dysphagia, itching, sneezing, nasal congestion or excess/ purulent secretions, fever, chills, sweats, unintended wt loss, pleuritic or  exertional cp, hempoptysis, orthopnea pnd or change in chronic leg swelling. Also denies presyncope, palpitations, heartburn, abdominal pain, nausea, vomiting, diarrhea or change in bowel or urinary habits, dysuria,hematuria, rash, arthralgias, visual complaints, headache, numbness weakness or ataxia.     Objective:   Physical Exam  Gen. Pleasant, well-nourished, in no distress ENT - no lesions, no post nasal drip Neck: No JVD, no thyromegaly, no carotid bruits Lungs: no use of accessory muscles, no dullness to percussion,bibasal rales, no rhonchi  Cardiovascular: Rhythm regular, heart sounds  normal, no murmurs or gallops, no peripheral edema Musculoskeletal: No deformities, no cyanosis or clubbing        Assessment & Plan:

## 2013-09-15 NOTE — Patient Instructions (Signed)
Continue your current therapy including your current dose of lasix.  I will see you in 6 months

## 2013-09-15 NOTE — Assessment & Plan Note (Signed)
Lasix 40 twice daily

## 2013-09-15 NOTE — Assessment & Plan Note (Signed)
Lung function has dropped from 2011 but stable since 2014 Again this could be idiopathic pulmonary fibrosis or related to rheumatoid arthritis- we discussed antipsychotic medications and their side effects, and given stable lung function compared to last year, decided to hold off on this. His symptoms of arthritis seem to be well controlled on prednisone and Arava. Flu shot next month

## 2013-09-15 NOTE — Patient Instructions (Addendum)
Your fibrosis may be related to rheumatoid or of the idiopathic kind Hold off on medication for this Lung function has dropped from 2011 but stable since 2014

## 2013-09-27 ENCOUNTER — Telehealth: Payer: Self-pay | Admitting: Internal Medicine

## 2013-09-27 NOTE — Telephone Encounter (Signed)
Pt came by seeking advice from [place]. He states he is having several health complications and would like to discuss them and then be advised whether an appt is necessary.

## 2013-09-27 NOTE — Telephone Encounter (Signed)
LVM for pt to call back.   RE: need sx to send to PCP.

## 2013-09-28 NOTE — Telephone Encounter (Signed)
Pt has an appt with Dr. Felicity Coyer on 10/02/13.

## 2013-09-29 ENCOUNTER — Other Ambulatory Visit: Payer: Self-pay | Admitting: Cardiology

## 2013-09-29 ENCOUNTER — Encounter: Payer: Self-pay | Admitting: Cardiology

## 2013-09-29 MED ORDER — DIGOXIN 125 MCG PO TABS: 0.1250 mg | ORAL_TABLET | Freq: Every day | ORAL | Status: DC

## 2013-10-02 ENCOUNTER — Ambulatory Visit (INDEPENDENT_AMBULATORY_CARE_PROVIDER_SITE_OTHER): Payer: Medicare Other | Admitting: Internal Medicine

## 2013-10-02 ENCOUNTER — Encounter: Payer: Self-pay | Admitting: Internal Medicine

## 2013-10-02 VITALS — BP 140/84 | HR 83 | Temp 97.8°F | Ht 68.0 in | Wt 218.2 lb

## 2013-10-02 DIAGNOSIS — Z23 Encounter for immunization: Secondary | ICD-10-CM

## 2013-10-02 DIAGNOSIS — H9190 Unspecified hearing loss, unspecified ear: Secondary | ICD-10-CM

## 2013-10-02 DIAGNOSIS — H9193 Unspecified hearing loss, bilateral: Secondary | ICD-10-CM

## 2013-10-02 DIAGNOSIS — I5032 Chronic diastolic (congestive) heart failure: Secondary | ICD-10-CM

## 2013-10-02 DIAGNOSIS — J84112 Idiopathic pulmonary fibrosis: Secondary | ICD-10-CM

## 2013-10-02 DIAGNOSIS — I4891 Unspecified atrial fibrillation: Secondary | ICD-10-CM

## 2013-10-02 DIAGNOSIS — I482 Chronic atrial fibrillation, unspecified: Secondary | ICD-10-CM

## 2013-10-02 DIAGNOSIS — M069 Rheumatoid arthritis, unspecified: Secondary | ICD-10-CM

## 2013-10-02 NOTE — Patient Instructions (Signed)
It was good to see you today.  We have reviewed your prior records including labs and tests today  Medications reviewed and updated, no changes recommended at this time.  Your annual flu shot was given and/or updated today.  we'll make referral for hearing test . Our office will contact you regarding appointment(s) once made.  Please schedule followup in 6 months for semiannual exam and labs, call sooner if problems.

## 2013-10-02 NOTE — Assessment & Plan Note (Signed)
Rate controlled anticoag with rivarobaxan off beta-blocker due to bradycardia 01/2011 Off amio due to pulm fibrosis continue dilt and Dig for rate control - follow up with cards as recommended

## 2013-10-02 NOTE — Addendum Note (Signed)
Addended by: Tonye Becket on: 10/02/2013 01:15 PM   Modules accepted: Orders

## 2013-10-02 NOTE — Assessment & Plan Note (Signed)
IPF - chronic changes Reviewed recent pulm follow up - stable on CT and PFTs 08/2013

## 2013-10-02 NOTE — Assessment & Plan Note (Signed)
echo 10/2011: Severe LV hypertrophy with normal EF. Restrictive filling pattern consistent with diastolic dysfunction no significant change on 07/2013 echo: 50-55% LVEF  Acute exac 05/2013 following tx of COPD exac; breathing now back to baseline and edema improved Cardiology eval 08/2013 - stable continue lasix 40 bid and conrol AF rate + HTN

## 2013-10-02 NOTE — Progress Notes (Signed)
Subjective:    Patient ID: [name], male    DOB: April 28, 1931, 80 y.o.   MRN: [id number]  HPI  Patient here for follow up Reviewed chronic medical issues and interval medical events  Past Medical History  Diagnosis Date  . Atrial flutter     s/p ablation for RVR  03/2009, pradaxa ongoing  . VENOUS INSUFFICIENCY, LEFT LEG   . PULMONARY FIBROSIS, INTERSTITIAL   . OBSTRUCTIVE SLEEP APNEA   . BENIGN POSITIONAL VERTIGO   . HYPERGLYCEMIA 2011  . Interstitial lung disease   . ALLERGIC RHINITIS   . Rheumatoid arthritis(714.0)   . Chronic diastolic CHF (congestive heart failure)   . PERIPHERAL NEUROPATHY   . HYPERTENSION   . HYPERLIPIDEMIA   . DEPRESSION     [facility] hosp 01/2011 for SI  . Anxiety   . Morbid obesity   . Diverticulosis of colon (without mention of hemorrhage)     Review of Systems  Constitutional: Negative for fever, fatigue and unexpected weight change.  HENT: Positive for hearing loss (?wax).   Respiratory: Positive for shortness of breath (chronic w/o change). Negative for cough.   Cardiovascular: Positive for leg swelling (intermittent, worse without lasix). Negative for chest pain and palpitations.  Musculoskeletal: Positive for gait problem. Negative for back pain.       Objective:   Physical Exam  BP 140/84  Pulse 83  Temp(Src) 97.8 F (36.6 C) (Oral)  Ht 5\' 8"  (1.727 m)  Wt 218 lb 4 oz (98.998 kg)  BMI 33.19 kg/m2  SpO2 94% Wt Readings from Last 3 Encounters:  10/02/13 218 lb 4 oz (98.998 kg)  09/15/13 216 lb (97.977 kg)  09/15/13 215 lb 9.6 oz (97.796 kg)   Constitutional: he is overweight, distracted (baseline)- appears well-developed and well-nourished. No acute distress.  HENT: no cerumen impaction B ears Neck: Normal range of motion. Neck supple. No JVD present. No thyromegaly present.  Cardiovascular: Normal rate, irregular rhythm -normal heart sounds.  No murmur heard. slight L>R BLE edema. Pulmonary/Chest: Effort normal and breath  sounds with fine crackles in B base. No respiratory distress. he has no wheezes.  Psychiatric: he has a perseverating mannerism, approp mood and affect. His behavior is normal. Judgment and thought content normal.   Lab Results  Component Value Date   WBC 10.0 06/20/2013   HGB 12.2* 06/20/2013   HCT 36.8* 06/20/2013   PLT 380.0 06/20/2013   GLUCOSE 125* 08/16/2013   CHOL 172 08/16/2013   TRIG 77.0 08/16/2013   HDL 31.80* 08/16/2013   LDLCALC 125* 08/16/2013   ALT 17 06/20/2013   AST 16 06/20/2013   NA 137 08/16/2013   K 4.0 08/16/2013   CL 102 08/16/2013   CREATININE 0.9 08/16/2013   BUN 16 08/16/2013   CO2 26 08/16/2013   TSH 3.71 06/20/2013   PSA 5.04* 09/26/2010   INR 1.14 09/19/2010   HGBA1C 6.2 06/20/2013    Ct Chest High Resolution  08/31/2013   CLINICAL DATA:  Shortness breath.  Weakness.  EXAM: CT CHEST WITHOUT CONTRAST  TECHNIQUE: Multidetector CT imaging of the chest was performed following the standard protocol without intravenous contrast. High resolution imaging of the lungs, as well as inspiratory and expiratory imaging, was performed.  COMPARISON:  Chest CT 09/30/2012.  FINDINGS: Mediastinum: Heart size is mildly enlarged. There is no significant pericardial fluid, thickening or pericardial calcification. There is atherosclerosis of the thoracic aorta, the great vessels of the mediastinum and the coronary arteries, including calcified  atherosclerotic plaque in the left main, left anterior descending, left circumflex and right coronary arteries. Faint calcifications of the aortic valve and mitral annulus. Multiple borderline enlarged and mildly enlarged mediastinal and bilateral hilar lymph nodes are similar to the prior examination, presumably chronic and reactive. Esophagus is unremarkable in appearance.  Lungs/Pleura: Again noted is a pattern of diffuse subpleural reticulation with multifocal parenchymal banding, thickening of the peribronchovascular interstitium with scattered areas of traction  bronchiectasis, peripheral bronchiolectasis and extensive honeycombing. Findings have a definitive craniocaudal gradient, an appear essentially unchanged when compared to prior study from 09/30/2012, the have significantly progressed when compared to more remote prior study from 04/03/2009. No acute consolidative airspace disease. No pleural effusions. Inspiratory and expiratory imaging is unremarkable.  Upper Abdomen: Exophytic low-attenuation lesion in the medial aspect of the upper pole of the right kidney is incompletely visualized but appears similar to the prior study, presumably a renal cyst. 10 mm low attenuation lesion in segment 2 of the liver is also incompletely characterized on today's non contrast CT examination, but is stable in size compared to the prior study, also likely a benign lesion such as a hepatic cyst.  Musculoskeletal: There are no aggressive appearing lytic or blastic lesions noted in the visualized portions of the skeleton.  IMPRESSION: 1. The appearance of the lungs is again compatible with interstitial lung disease, and is most compatible with usual interstitial pneumonia (UIP). The severity of disease appears to be stable compared to the prior examination from 09/30/2012 (although significantly progressed when compared to more remote prior study 04/03/2009), suggesting a positive response to therapy. 2. Atherosclerosis, including left main and 3 vessel coronary artery disease. Assessment for potential risk factor modification, dietary therapy or pharmacologic therapy may be warranted, if clinically indicated. 3. Mild cardiomegaly. 4. Additional incidental findings, as above.   Electronically Signed   By: Trudie Reed M.D.   On: 08/31/2013 20:30       Assessment & Plan:   Hearing loss - no cerumen impaction -refer for audiology for hearing change  Problem List Items Addressed This Visit   Chronic a-fib     Rate controlled anticoag with rivarobaxan off beta-blocker due  to bradycardia 01/2011 Off amio due to pulm fibrosis continue dilt and Dig for rate control - follow up with cards as recommended    Chronic diastolic heart failure - Primary     echo 10/2011: Severe LV hypertrophy with normal EF. Restrictive filling pattern consistent with diastolic dysfunction no significant change on 07/2013 echo: 50-55% LVEF  Acute exac 05/2013 following tx of COPD exac; breathing now back to baseline and edema improved Cardiology eval 08/2013 - stable continue lasix 40 bid and conrol AF rate + HTN    IPF (idiopathic pulmonary fibrosis)     IPF - chronic changes Reviewed recent pulm follow up - stable on CT and PFTs 08/2013    Rheumatoid arthritis     On Arava from rheum since 2011 for same - follows with [facility] Also prednisone as needed Continue hydrocodone prn (supplied by rheum as needed, no refills from me today)     Other Visit Diagnoses   Hearing loss, bilateral

## 2013-10-02 NOTE — Progress Notes (Signed)
Pre visit review using our clinic review tool, if applicable. No additional management support is needed unless otherwise documented below in the visit note. 

## 2013-10-02 NOTE — Assessment & Plan Note (Signed)
On Arava from rheum since 2011 for same - follows with Dareen Piano Also prednisone as needed Continue hydrocodone prn (supplied by rheum as needed, no refills from me today)

## 2013-11-01 ENCOUNTER — Telehealth: Payer: Self-pay | Admitting: Cardiology

## 2013-11-01 NOTE — Telephone Encounter (Signed)
On 11/29/12 digoxin was decreased to 0.125mg  once daily. Communicated this with patient. Voiced understanding.

## 2013-11-01 NOTE — Telephone Encounter (Signed)
Pt have two different mg of Dixogin. One is 0.25 mg and the other one is 0.125 mg.Please call and let him know which one is correct?

## 2013-11-10 ENCOUNTER — Telehealth: Payer: Self-pay | Admitting: *Deleted

## 2013-11-10 ENCOUNTER — Other Ambulatory Visit: Payer: Self-pay | Admitting: *Deleted

## 2013-11-10 MED ORDER — RIVAROXABAN 20 MG PO TABS: 20.0000 mg | ORAL_TABLET | Freq: Every day | ORAL | Status: DC

## 2013-11-10 MED ORDER — FUROSEMIDE 40 MG PO TABS: ORAL_TABLET | ORAL | Status: DC

## 2013-11-10 NOTE — Telephone Encounter (Signed)
Spoke to patient he stated he takes lasix 40 mg 2 to 3 times a day.Stated most days he just takes 40 mg twice a day. Stated Dr.Jordan had told him ok to take like this.Stated he has a bladder problem and is unable to take 80 mg twice a day.Refill sent to pharmacy.

## 2013-11-10 NOTE — Telephone Encounter (Signed)
Received rx request for lasix from [facility]. Please advise on what patients current lasix dose should be. Thanks, MI

## 2013-11-14 ENCOUNTER — Other Ambulatory Visit: Payer: Self-pay

## 2013-11-14 MED ORDER — MEMANTINE HCL 10 MG PO TABS: 10.0000 mg | ORAL_TABLET | Freq: Every day | ORAL | Status: DC

## 2013-11-15 ENCOUNTER — Encounter (HOSPITAL_COMMUNITY): Payer: Self-pay | Admitting: Emergency Medicine

## 2013-11-15 ENCOUNTER — Inpatient Hospital Stay (HOSPITAL_COMMUNITY)
Admission: EM | Admit: 2013-11-15 | Discharge: 2013-11-20 | DRG: 292 | Disposition: A | Discharge status: Skilled Nursing Facility | Payer: Medicare Other | Attending: Internal Medicine | Admitting: Internal Medicine

## 2013-11-15 ENCOUNTER — Emergency Department (HOSPITAL_COMMUNITY): Payer: Medicare Other

## 2013-11-15 DIAGNOSIS — I482 Chronic atrial fibrillation, unspecified: Secondary | ICD-10-CM

## 2013-11-15 DIAGNOSIS — R350 Frequency of micturition: Secondary | ICD-10-CM | POA: Diagnosis present

## 2013-11-15 DIAGNOSIS — F0390 Unspecified dementia without behavioral disturbance: Secondary | ICD-10-CM | POA: Diagnosis present

## 2013-11-15 DIAGNOSIS — Z9111 Patient's noncompliance with dietary regimen: Secondary | ICD-10-CM | POA: Diagnosis present

## 2013-11-15 DIAGNOSIS — I5043 Acute on chronic combined systolic (congestive) and diastolic (congestive) heart failure: Principal | ICD-10-CM | POA: Diagnosis present

## 2013-11-15 DIAGNOSIS — J84112 Idiopathic pulmonary fibrosis: Secondary | ICD-10-CM | POA: Diagnosis present

## 2013-11-15 DIAGNOSIS — Z87891 Personal history of nicotine dependence: Secondary | ICD-10-CM

## 2013-11-15 DIAGNOSIS — I10 Essential (primary) hypertension: Secondary | ICD-10-CM

## 2013-11-15 DIAGNOSIS — E785 Hyperlipidemia, unspecified: Secondary | ICD-10-CM | POA: Diagnosis present

## 2013-11-15 DIAGNOSIS — R32 Unspecified urinary incontinence: Secondary | ICD-10-CM | POA: Diagnosis present

## 2013-11-15 DIAGNOSIS — I248 Other forms of acute ischemic heart disease: Secondary | ICD-10-CM | POA: Diagnosis present

## 2013-11-15 DIAGNOSIS — I872 Venous insufficiency (chronic) (peripheral): Secondary | ICD-10-CM | POA: Diagnosis present

## 2013-11-15 DIAGNOSIS — I2510 Atherosclerotic heart disease of native coronary artery without angina pectoris: Secondary | ICD-10-CM | POA: Diagnosis present

## 2013-11-15 DIAGNOSIS — I4892 Unspecified atrial flutter: Secondary | ICD-10-CM | POA: Diagnosis present

## 2013-11-15 DIAGNOSIS — I5033 Acute on chronic diastolic (congestive) heart failure: Secondary | ICD-10-CM

## 2013-11-15 DIAGNOSIS — M069 Rheumatoid arthritis, unspecified: Secondary | ICD-10-CM | POA: Diagnosis present

## 2013-11-15 DIAGNOSIS — Z6832 Body mass index (BMI) 32.0-32.9, adult: Secondary | ICD-10-CM | POA: Diagnosis not present

## 2013-11-15 DIAGNOSIS — G629 Polyneuropathy, unspecified: Secondary | ICD-10-CM | POA: Diagnosis present

## 2013-11-15 DIAGNOSIS — I472 Ventricular tachycardia: Secondary | ICD-10-CM | POA: Diagnosis present

## 2013-11-15 DIAGNOSIS — G4733 Obstructive sleep apnea (adult) (pediatric): Secondary | ICD-10-CM | POA: Diagnosis present

## 2013-11-15 DIAGNOSIS — R000 Tachycardia, unspecified: Secondary | ICD-10-CM

## 2013-11-15 DIAGNOSIS — J449 Chronic obstructive pulmonary disease, unspecified: Secondary | ICD-10-CM | POA: Diagnosis present

## 2013-11-15 DIAGNOSIS — F329 Major depressive disorder, single episode, unspecified: Secondary | ICD-10-CM | POA: Diagnosis present

## 2013-11-15 DIAGNOSIS — J309 Allergic rhinitis, unspecified: Secondary | ICD-10-CM | POA: Diagnosis present

## 2013-11-15 DIAGNOSIS — R0602 Shortness of breath: Secondary | ICD-10-CM | POA: Diagnosis not present

## 2013-11-15 DIAGNOSIS — R739 Hyperglycemia, unspecified: Secondary | ICD-10-CM | POA: Diagnosis present

## 2013-11-15 DIAGNOSIS — F419 Anxiety disorder, unspecified: Secondary | ICD-10-CM | POA: Diagnosis present

## 2013-11-15 DIAGNOSIS — I4891 Unspecified atrial fibrillation: Secondary | ICD-10-CM

## 2013-11-15 DIAGNOSIS — I5032 Chronic diastolic (congestive) heart failure: Secondary | ICD-10-CM | POA: Diagnosis present

## 2013-11-15 DIAGNOSIS — R079 Chest pain, unspecified: Secondary | ICD-10-CM

## 2013-11-15 DIAGNOSIS — I214 Non-ST elevation (NSTEMI) myocardial infarction: Secondary | ICD-10-CM

## 2013-11-15 DIAGNOSIS — I5031 Acute diastolic (congestive) heart failure: Secondary | ICD-10-CM

## 2013-11-15 LAB — CBC WITH DIFFERENTIAL/PLATELET
Basophils Absolute: 0.1 10*3/uL (ref 0.0–0.1)
Basophils Relative: 0 % (ref 0–1)
EOS ABS: 0.1 10*3/uL (ref 0.0–0.7)
Eosinophils Relative: 1 % (ref 0–5)
HCT: 40.4 % (ref 39.0–52.0)
HEMOGLOBIN: 13.6 g/dL (ref 13.0–17.0)
Lymphocytes Relative: 5 % — ABNORMAL LOW (ref 12–46)
Lymphs Abs: 0.6 10*3/uL — ABNORMAL LOW (ref 0.7–4.0)
MCH: 32.4 pg (ref 26.0–34.0)
MCHC: 33.7 g/dL (ref 30.0–36.0)
MCV: 96.2 fL (ref 78.0–100.0)
MONO ABS: 0.8 10*3/uL (ref 0.1–1.0)
Monocytes Relative: 7 % (ref 3–12)
NEUTROS ABS: 10.2 10*3/uL — AB (ref 1.7–7.7)
Neutrophils Relative %: 87 % — ABNORMAL HIGH (ref 43–77)
Platelets: 255 10*3/uL (ref 150–400)
RBC: 4.2 MIL/uL — ABNORMAL LOW (ref 4.22–5.81)
RDW: 17.2 % — ABNORMAL HIGH (ref 11.5–15.5)
WBC: 11.8 10*3/uL — ABNORMAL HIGH (ref 4.0–10.5)

## 2013-11-15 LAB — BASIC METABOLIC PANEL
Anion gap: 14 (ref 5–15)
BUN: 17 mg/dL (ref 6–23)
CHLORIDE: 102 meq/L (ref 96–112)
CO2: 22 mEq/L (ref 19–32)
Calcium: 9 mg/dL (ref 8.4–10.5)
Creatinine, Ser: 0.83 mg/dL (ref 0.50–1.35)
GFR calc non Af Amer: 81 mL/min — ABNORMAL LOW (ref >90)
Glucose, Bld: 119 mg/dL — ABNORMAL HIGH (ref 70–99)
Potassium: 4.5 mEq/L (ref 3.7–5.3)
Sodium: 138 mEq/L (ref 137–147)

## 2013-11-15 LAB — I-STAT TROPONIN, ED: Troponin i, poc: 0.24 ng/mL (ref 0.00–0.08)

## 2013-11-15 LAB — PRO B NATRIURETIC PEPTIDE: Pro B Natriuretic peptide (BNP): 4542 pg/mL — ABNORMAL HIGH (ref 0–450)

## 2013-11-15 MED ORDER — ONDANSETRON HCL 4 MG/2ML IJ SOLN: 4.0000 mg | Freq: Four times a day (QID) | INTRAMUSCULAR | Status: DC | PRN

## 2013-11-15 MED ORDER — FUROSEMIDE 10 MG/ML IJ SOLN
80.0000 mg | Freq: Two times a day (BID) | INTRAMUSCULAR | Status: DC
  Administered 2013-11-16: 80 mg via INTRAVENOUS
  Filled 2013-11-15 (×2): qty 8

## 2013-11-15 MED ORDER — GUAIFENESIN ER 600 MG PO TB12
600.0000 mg | ORAL_TABLET | Freq: Two times a day (BID) | ORAL | Status: DC
  Administered 2013-11-16 – 2013-11-20 (×9): 600 mg via ORAL
  Filled 2013-11-15 (×10): qty 1

## 2013-11-15 MED ORDER — SODIUM CHLORIDE 0.9 % IJ SOLN: 3.0000 mL | INTRAMUSCULAR | Status: DC | PRN

## 2013-11-15 MED ORDER — HYDROCODONE-ACETAMINOPHEN 5-325 MG PO TABS
1.0000 | ORAL_TABLET | ORAL | Status: DC | PRN
  Administered 2013-11-16 (×3): 2 via ORAL
  Administered 2013-11-17 – 2013-11-18 (×2): 1 via ORAL
  Filled 2013-11-15 (×2): qty 1
  Filled 2013-11-15 (×3): qty 2

## 2013-11-15 MED ORDER — ACETAMINOPHEN 325 MG PO TABS: 650.0000 mg | ORAL_TABLET | ORAL | Status: DC | PRN

## 2013-11-15 MED ORDER — ASPIRIN EC 81 MG PO TBEC
81.0000 mg | DELAYED_RELEASE_TABLET | Freq: Every day | ORAL | Status: DC
  Administered 2013-11-16 – 2013-11-20 (×5): 81 mg via ORAL
  Filled 2013-11-15 (×5): qty 1

## 2013-11-15 MED ORDER — MEMANTINE HCL 10 MG PO TABS
10.0000 mg | ORAL_TABLET | Freq: Every day | ORAL | Status: DC
  Administered 2013-11-16 – 2013-11-20 (×5): 10 mg via ORAL
  Filled 2013-11-15 (×5): qty 1

## 2013-11-15 MED ORDER — DIGOXIN 125 MCG PO TABS
0.1250 mg | ORAL_TABLET | Freq: Every day | ORAL | Status: DC
  Administered 2013-11-16 – 2013-11-20 (×5): 0.125 mg via ORAL
  Filled 2013-11-15 (×5): qty 1

## 2013-11-15 MED ORDER — RIVAROXABAN 20 MG PO TABS
20.0000 mg | ORAL_TABLET | Freq: Every day | ORAL | Status: DC
  Administered 2013-11-16 – 2013-11-20 (×5): 20 mg via ORAL
  Filled 2013-11-15 (×5): qty 1

## 2013-11-15 MED ORDER — PANTOPRAZOLE SODIUM 40 MG PO TBEC
40.0000 mg | DELAYED_RELEASE_TABLET | Freq: Every day | ORAL | Status: DC
  Administered 2013-11-16 – 2013-11-20 (×5): 40 mg via ORAL
  Filled 2013-11-15 (×5): qty 1

## 2013-11-15 MED ORDER — ASPIRIN 81 MG PO CHEW
324.0000 mg | CHEWABLE_TABLET | Freq: Once | ORAL | Status: AC
  Administered 2013-11-16: 324 mg via ORAL
  Filled 2013-11-15: qty 4

## 2013-11-15 MED ORDER — SODIUM CHLORIDE 0.9 % IJ SOLN
3.0000 mL | Freq: Two times a day (BID) | INTRAMUSCULAR | Status: DC
  Administered 2013-11-16 – 2013-11-20 (×9): 3 mL via INTRAVENOUS

## 2013-11-15 MED ORDER — DILTIAZEM HCL ER COATED BEADS 180 MG PO CP24
180.0000 mg | ORAL_CAPSULE | Freq: Every day | ORAL | Status: DC
  Administered 2013-11-16 – 2013-11-19 (×4): 180 mg via ORAL
  Filled 2013-11-15 (×4): qty 1

## 2013-11-15 MED ORDER — FUROSEMIDE 10 MG/ML IJ SOLN
80.0000 mg | INTRAMUSCULAR | Status: AC
  Administered 2013-11-15: 80 mg via INTRAVENOUS
  Filled 2013-11-15: qty 8

## 2013-11-15 MED ORDER — PREDNISONE 5 MG PO TABS
5.0000 mg | ORAL_TABLET | Freq: Every day | ORAL | Status: DC
  Administered 2013-11-16 – 2013-11-20 (×5): 5 mg via ORAL
  Filled 2013-11-15 (×7): qty 1

## 2013-11-15 MED ORDER — SODIUM CHLORIDE 0.9 % IV SOLN: 250.0000 mL | INTRAVENOUS | Status: DC | PRN

## 2013-11-15 NOTE — ED Notes (Signed)
Per EMS: States that neighbors called the police out to patients home. The police Called EMS. EMS stated the patient was confused and and had some bleeding from skin tag on testicle.  20g Right hand BP 201/188 HR 180 2L of O2 via [place] 100%  Per patient: States congestion and coughing. States could not breath and was confused. States he has COPD. Also states some bleeding from skin tag.

## 2013-11-15 NOTE — ED Provider Notes (Signed)
CSN: [id number]     Arrival date & time 11/15/13  2116 History   First MD Initiated Contact with Patient 11/15/13 2130     Chief Complaint  Patient presents with  . Shortness of Breath     (Consider location/radiation/quality/duration/timing/severity/associated sxs/prior Treatment) HPI Comments: Patient with history of COPD, hypertension, hyperlipidemia, interstitial pulmonary fibrosis, and CHF, presents to emergency department with chief complaint of shortness breath. Patient states that the symptoms started today. Reportedly, his neighbors saw him breathing with difficulty, and call EMS. Patient was somewhat confused when EMS arrived. Patient reports congestion and coughing. He denies any fevers chills. He has not taken anything severe symptoms. Symptoms are aggravated with exertion.  He is followed by Dr. [name] of pulmonology Dr. Swaziland of cardiology PCP: Dr. Felicity Coyer  The history is provided by the patient. No language interpreter was used.    Past Medical History  Diagnosis Date  . Atrial flutter     s/p ablation for RVR  03/2009, pradaxa ongoing  . VENOUS INSUFFICIENCY, LEFT LEG   . PULMONARY FIBROSIS, INTERSTITIAL   . OBSTRUCTIVE SLEEP APNEA   . BENIGN POSITIONAL VERTIGO   . HYPERGLYCEMIA 2011  . Interstitial lung disease   . ALLERGIC RHINITIS   . Rheumatoid arthritis(714.0)   . Chronic diastolic CHF (congestive heart failure)   . PERIPHERAL NEUROPATHY   . HYPERTENSION   . HYPERLIPIDEMIA   . DEPRESSION     [facility] hosp 01/2011 for SI  . Anxiety   . Morbid obesity   . Diverticulosis of colon (without mention of hemorrhage)    Past Surgical History  Procedure Laterality Date  . 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 Shoulder surgery Right 09/2009  . Varicose vein surgery      Left Leg  . Radiofrequency ablation    . Colonoscopy w/ biopsies     Family History  Problem Relation Age of Onset  . Heart attack  Father 53  . Other Mother 44    Old age  . Colon cancer Neg Hx    History  Substance Use Topics  . Smoking status: Former Smoker -- 1.00 packs/day for 3 years    Types: Cigarettes    Quit date: 01/20/2000  . Smokeless tobacco: Never Used     Comment: Single, lives alone. 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 His family contact would be his sister [name] [phone]  . Alcohol Use: No     Comment: Former    Review of Systems  Constitutional: Negative for fever and chills.  Respiratory: Positive for cough, shortness of breath and wheezing.   Cardiovascular: Negative for chest pain.  Gastrointestinal: Negative for nausea, vomiting, diarrhea and constipation.  Genitourinary: Negative for dysuria.  All other systems reviewed and are negative.     Allergies  Methotrexate  Home Medications   Prior to Admission medications   Medication Sig Start Date End Date Taking? Authorizing Provider  Ascorbic Acid (VITAMIN C) 500 MG tablet Take 500 mg by mouth daily.      Historical Provider, MD  Cholecalciferol (VITAMIN D3) 1000 UNITS CAPS Take 1 capsule by mouth.     Historical Provider, MD  Coral Calcium 1000 (390 CA) MG TABS Take 2 capsules by mouth.      Historical Provider, MD  digoxin (LANOXIN) 0.125 MG tablet Take 1 tablet (0.125 mg total) by mouth daily. 09/29/13  Peter M Sw[place]il[place], MD  diltiazem (CARDIZEM CD) 180 MG 24 hr capsule Take 1 capsule (180 mg total) by mouth daily. I capsule PO daily 07/27/13   Newt Lukes, MD  furosemide (LASIX) 40 MG tablet Takes 40 mg twice a day to three times a day 11/10/13   Peter M [place], MD  gabapentin (NEURONTIN) 300 MG capsule TAKE 2 CAPSULES BY MOUTH AT BEDTIME 05/02/13   Newt Lukes, MD  guaiFENesin (MUCINEX) 600 MG 12 hr tablet Take 600 mg by mouth 2 (two) times daily.    Historical Provider, MD  HYDROcodone-acetaminophen (VICODIN) 5-500 MG per tablet Take 0.5-1 tablets by mouth every 6 (six) hours as  needed.     Historical Provider, MD  leflunomide (ARAVA) 10 MG tablet Take 1 tablet (10 mg total) by mouth daily. 02/16/11   Newt Lukes, MD  Melatonin 5 MG CAPS Take 1 capsule by mouth at bedtime.    Historical Provider, MD  memantine (NAMENDA) 10 MG tablet Take 1 tablet (10 mg total) by mouth daily. 11/14/13   Newt Lukes, MD  Multiple Vitamin (MULTIVITAMIN) tablet Take 1 tablet by mouth daily.      Historical Provider, MD  NON FORMULARY at bedtime. c pap    Historical Provider, MD  OMEGA-3 1000 MG CAPS Take by mouth 2 (two) times daily.     Historical Provider, MD  omeprazole (PRILOSEC) 20 MG capsule Take 1 capsule (20 mg total) by mouth 2 (two) times daily. 07/07/13   Newt Lukes, MD  predniSONE (DELTASONE) 5 MG tablet Take 1 tablet by mouth 3 (three) times daily as needed. 08/17/13   Historical Provider, MD  rivaroxaban (XARELTO) 20 MG TABS tablet Take 1 tablet (20 mg total) by mouth daily with supper. 11/10/13   Peter M [place], MD   BP 158/78  Pulse 108  Temp(Src) 98.3 F (36.8 C) (Oral)  Resp 24  Ht  (1.727 m)  Wt 215 lb (97.523 kg)  BMI 32.70 kg/m2  SpO2 96% Physical Exam  Nursing note and vitals reviewed. Constitutional: He is oriented to person, place, and time. He appears well-developed and well-nourished.  HENT:  Head: Normocephalic and atraumatic.  Eyes: Conjunctivae and EOM are normal. Pupils are equal, round, and reactive to light. Right eye exhibits no discharge. Left eye exhibits no discharge. No scleral icterus.  Neck: Normal range of motion. Neck supple. No JVD present.  Cardiovascular: Regular rhythm and normal heart sounds.  Exam reveals no gallop and no friction rub.   No murmur heard. tachycardic  Pulmonary/Chest: Effort normal. No respiratory distress. He has no wheezes. He has no rales. He exhibits no tenderness.  Bilateral lower lung field crackles  Abdominal: Soft. He exhibits no distension and no mass. There is no tenderness. There is  no rebound and no guarding.  Musculoskeletal: Normal range of motion. He exhibits no edema and no tenderness.  Neurological: He is alert and oriented to person, place, and time.  Skin: Skin is warm and dry.  Psychiatric: He has a normal mood and affect. His behavior is normal. Judgment and thought content normal.    ED Course  Procedures (including critical care time) Results for orders placed during the hospital encounter of 11/15/13  CBC WITH DIFFERENTIAL      Result Value Ref Range   WBC 11.8 (*) 4.0 - 10.5 K/uL   RBC 4.20 (*) 4.22 - 5.81 MIL/uL   Hemoglobin 13.6  13.0 - 17.0 g/dL   HCT  40.4  39.0 - 52.0 %   MCV 96.2  78.0 - 100.0 fL   MCH 32.4  26.0 - 34.0 pg   MCHC 33.7  30.0 - 36.0 g/dL   RDW 38.4 (*) 66.5 - 99.3 %   Platelets 255  150 - 400 K/uL   Neutrophils Relative % 87 (*) 43 - 77 %   Neutro Abs 10.2 (*) 1.7 - 7.7 K/uL   Lymphocytes Relative 5 (*) 12 - 46 %   Lymphs Abs 0.6 (*) 0.7 - 4.0 K/uL   Monocytes Relative 7  3 - 12 %   Monocytes Absolute 0.8  0.1 - 1.0 K/uL   Eosinophils Relative 1  0 - 5 %   Eosinophils Absolute 0.1  0.0 - 0.7 K/uL   Basophils Relative 0  0 - 1 %   Basophils Absolute 0.1  0.0 - 0.1 K/uL  BASIC METABOLIC PANEL      Result Value Ref Range   Sodium 138  137 - 147 mEq/L   Potassium 4.5  3.7 - 5.3 mEq/L   Chloride 102  96 - 112 mEq/L   CO2 22  19 - 32 mEq/L   Glucose, Bld 119 (*) 70 - 99 mg/dL   BUN 17  6 - 23 mg/dL   Creatinine, Ser 5.70  0.50 - 1.35 mg/dL   Calcium 9.0  8.4 - 17.7 mg/dL   GFR calc non Af Amer 81 (*) >90 mL/min   GFR calc Af Amer >90  >90 mL/min   Anion gap 14  5 - 15  PRO B NATRIURETIC PEPTIDE      Result Value Ref Range   Pro B Natriuretic peptide (BNP) 4542.0 (*) 0 - 450 pg/mL  I-STAT TROPOININ, ED      Result Value Ref Range   Troponin i, poc 0.24 (*) 0.00 - 0.08 ng/mL   Comment NOTIFIED PHYSICIAN     Comment 3            Dg Chest 2 View  11/15/2013   CLINICAL DATA:  Congestion and cough. COPD. Confusion.  Hypertension.  EXAM: CHEST  2 VIEW  COMPARISON:  Multiple exams, including 08/31/2013  FINDINGS: Abnormal interstitial and patchy airspace opacities throughout the lungs likely due to a combination of usual interstitial pneumonitis and superimposed indistinct airspace processes especially in the left lung. Cardiomegaly. Thoracic spondylosis. No pleural effusion observed. Lumbar spondylosis and degenerative disc disease.  IMPRESSION: 1. We observe a combination of the patient's underlying fairly severe chronic interstitial lung disease (attributed to usual interstitial pneumonitis on prior high-resolution CT workup) with superimposed airspace opacities especially peripherally in the left lung which could represent superimposed pneumonia or asymmetric edema. 2.  Enlargement of the cardiopericardial silhouette.   Electronically Signed   By: Herbie Baltimore M.D.   On: 11/15/2013 22:28      EKG Interpretation   Date/Time:  Wednesday November 15 2013 21:25:45 EDT Ventricular Rate:  103 PR Interval:    QRS Duration: 113 QT Interval:  361 QTC Calculation: 472 R Axis:   157 Text Interpretation:  Right and left arm electrode reversal,  interpretation assumes no reversal Atrial fibrillation Paired ventricular  premature complexes Aberrant conduction of SV complex(es) Low voltage,  extremity and precordial leads Abnormal lateral Q waves Anteroseptal  infarct, old Minimal ST depression, lateral leads Baseline wander in  lead(s) III No significant change since last tracing Confirmed by Anitra Lauth   MD, Alphonzo Lemmings ([id number]) on 11/15/2013 9:37:35 PM  MDM   Final diagnoses:  SOB (shortness of breath)  Chest pain, unspecified chest pain type    Patient with shortness of breath, multiple risk factors for ACS. Patient also has plenty of pulmonary issues, including COPD, pulmonary fibrosis, and CHF.   Patient has elevated BNP and troponin.  Possible demand ischemia.  Patient seen by and discussed with Dr.  Anitra Lauth.  Plan for admission to cardiology.  Consulted cardiology who will see and admit the patient.    Roxy Horseman, PA-C 11/15/13 2308

## 2013-11-15 NOTE — H&P (Signed)
Admission History and Physical     Patient ID: [name], MRN: [id number], DOB: [birth year] 80 y.o. Date of Encounter: 11/15/2013, 10:50 PM  Primary Physician: [name], MD Primary Cardiologist: Dr. [place]  Chief Complaint: SOB, confusion.  History of Present Illness: [name] is a 80 y.o. male with a history of HFpEF, ILD, atrial fibrillation, poor dietary adherence, who presents with confusion, dyspnea, coughing and chest tightness..  EMS/Police called to house by neighbors, where patient was found to be confused and dyspneic.  Symptoms have been worsening for several days, but much worse today.  Patient is also very concerned about frequent urination.  No recent falls.  No lightheadedness.  +Orthopnea.  Lives at home alone.  Labs in ED significant for mildly elevated troponin and a BNP of 4,000.  CXR with worsening of chronic airspace opacities on left side.  EKG with afib, no acute ischemic changes.  Currently chest pain free.   Past Medical History  Diagnosis Date  . Atrial flutter     s/p ablation for RVR  03/2009, pradaxa ongoing  . VENOUS INSUFFICIENCY, LEFT LEG   . PULMONARY FIBROSIS, INTERSTITIAL   . OBSTRUCTIVE SLEEP APNEA   . BENIGN POSITIONAL VERTIGO   . HYPERGLYCEMIA 2011  . Interstitial lung disease   . ALLERGIC RHINITIS   . Rheumatoid arthritis(714.0)   . Chronic diastolic CHF (congestive heart failure)   . PERIPHERAL NEUROPATHY   . HYPERTENSION   . HYPERLIPIDEMIA   . DEPRESSION     [facility] hosp 01/2011 for SI  . Anxiety   . Morbid obesity   . Diverticulosis of colon (without mention of hemorrhage)      Past Surgical History  Procedure Laterality Date  . 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 Shoulder surgery Right 09/2009  . Varicose vein surgery      Left Leg  . Radiofrequency ablation    . Colonoscopy w/ biopsies        Current Facility-Administered Medications    Medication Dose Route Frequency Provider Last Rate Last Dose  . furosemide (LASIX) injection 80 mg  80 mg Intravenous STAT Roxy Horseman, PA-C       Current Outpatient Prescriptions  Medication Sig Dispense Refill  . Ascorbic Acid (VITAMIN C) 500 MG tablet Take 500 mg by mouth daily.        . Cholecalciferol (VITAMIN D3) 1000 UNITS CAPS Take 1 capsule by mouth.       . Coral Calcium 1000 (390 CA) MG TABS Take 2 capsules by mouth.        . digoxin (LANOXIN) 0.125 MG tablet Take 1 tablet (0.125 mg total) by mouth daily.  90 tablet  1  . diltiazem (CARDIZEM CD) 180 MG 24 hr capsule Take 1 capsule (180 mg total) by mouth daily. I capsule PO daily  90 capsule  1  . furosemide (LASIX) 40 MG tablet Takes 40 mg twice a day to three times a day  100 tablet  6  . gabapentin (NEURONTIN) 300 MG capsule TAKE 2 CAPSULES BY MOUTH AT BEDTIME  60 capsule  5  . guaiFENesin (MUCINEX) 600 MG 12 hr tablet Take 600 mg by mouth 2 (two) times daily.      Marland Kitchen HYDROcodone-acetaminophen (VICODIN) 5-500 MG per tablet Take 0.5-1 tablets by mouth every 6 (six) hours as needed.       . leflunomide (ARAVA) 10 MG  tablet Take 20 mg by mouth daily.       . Melatonin 5 MG CAPS Take 1 capsule by mouth at bedtime.      . memantine (NAMENDA) 10 MG tablet Take 1 tablet (10 mg total) by mouth daily.  30 tablet  5  . Multiple Vitamin (MULTIVITAMIN) tablet Take 1 tablet by mouth daily.        . NON FORMULARY at bedtime. c pap      . OMEGA-3 1000 MG CAPS Take by mouth 2 (two) times daily.       Marland Kitchen omeprazole (PRILOSEC) 20 MG capsule Take 1 capsule (20 mg total) by mouth 2 (two) times daily.  60 capsule  5  . predniSONE (DELTASONE) 5 MG tablet Take 1 tablet by mouth 3 (three) times daily as needed.      . rivaroxaban (XARELTO) 20 MG TABS tablet Take 1 tablet (20 mg total) by mouth daily with supper.  30 tablet  5      Allergies: Allergies  Allergen Reactions  . Methotrexate     REACTION: ONLY HAS ONE KIDNEY     Social  History:  The patient  reports that he quit smoking about 13 years ago. His smoking use included Cigarettes. He has a 3 pack-year smoking history. He has never used smokeless tobacco. He reports that he does not drink alcohol or use illicit drugs.   Family History:  The patient's family history includes Heart attack (age of onset: 83) in his father; Other (age of onset: 69) in his mother. There is no history of Colon cancer.   ROS:  Please see the history of present illness.   All other systems reviewed and negative.   Vital Signs: Blood pressure 158/78, pulse 108, temperature 98.3 F (36.8 C), temperature source Oral, resp. rate 24, height 5\' 8"  (1.727 m), weight 97.523 kg (215 lb), SpO2 96.00%.  PHYSICAL EXAM: General:  Elderly, NAD Lymph: no adenopathy Neck: JVP to 12cm with + HJR Cardiac:  Irregularly irregular.  No significant urmurs Lungs:  Bibasilar holoinspiratory rales. Abd: soft, nontender, no hepatomegaly Ext: 1-2+ edema BLE Musculoskeletal:  No deformities, BUE and BLE strength normal and equal Skin: warm and dry Neuro:  CNs 2-12 intact, no focal abnormalities noted Psych:  Normal affect   EKG:   AFib without acute ST changes.  Freq PVCs.  Labs:   Lab Results  Component Value Date   WBC 11.8* 11/15/2013   HGB 13.6 11/15/2013   HCT 40.4 11/15/2013   MCV 96.2 11/15/2013   PLT 255 11/15/2013    Recent Labs Lab 11/15/13 2139  NA 138  K 4.5  CL 102  CO2 22  BUN 17  CREATININE 0.83  CALCIUM 9.0  GLUCOSE 119*   BNP    Component Value Date/Time   PROBNP 4542.0* 11/15/2013 2139     Dg Chest 2 View  11/15/2013   CLINICAL DATA:  Congestion and cough. COPD. Confusion. Hypertension.  EXAM: CHEST  2 VIEW  COMPARISON:  Multiple exams, including 08/31/2013  FINDINGS: Abnormal interstitial and patchy airspace opacities throughout the lungs likely due to a combination of usual interstitial pneumonitis and superimposed indistinct airspace processes especially in the  left lung. Cardiomegaly. Thoracic spondylosis. No pleural effusion observed. Lumbar spondylosis and degenerative disc disease.  IMPRESSION: 1. We observe a combination of the patient's underlying fairly severe chronic interstitial lung disease (attributed to usual interstitial pneumonitis on prior high-resolution CT workup) with superimposed airspace opacities especially peripherally in the  left lung which could represent superimposed pneumonia or asymmetric edema. 2.  Enlargement of the cardiopericardial silhouette.   Electronically Signed   By: Herbie Baltimore M.D.   On: 11/15/2013 22:28     ASSESSMENT AND PLAN:   1. Acute Diastolic Heart Failure Exacerbation - Start 80mg  IV lasix BID.  Place foley given issues with freq urination. - Continue home diltiazem, digoxin. - TTE, repeat BMET in the morning - Hold BB due to lung disease - consider adding ACE-I and/or spironolactone to regimen as outpatient.  2. NSTEMI - Likely demand ischemic in setting of CHF and hypertension with severe LVH. - No CP currently, EKG benign. - ASA only for now, monitor enzymes.  Also on NOAC for AFib   3. AFib - Continue Xarelto, diltiazem  4. Urinary frequency - Check UA.  Likely chronic incontinence.     Signed,  Yaakov Guthrie, MD 11/15/2013, 10:50 PM

## 2013-11-16 ENCOUNTER — Encounter (HOSPITAL_COMMUNITY): Payer: Self-pay | Admitting: General Practice

## 2013-11-16 DIAGNOSIS — R0602 Shortness of breath: Secondary | ICD-10-CM

## 2013-11-16 DIAGNOSIS — I517 Cardiomegaly: Secondary | ICD-10-CM

## 2013-11-16 DIAGNOSIS — I482 Chronic atrial fibrillation: Secondary | ICD-10-CM

## 2013-11-16 DIAGNOSIS — I5033 Acute on chronic diastolic (congestive) heart failure: Secondary | ICD-10-CM

## 2013-11-16 DIAGNOSIS — R000 Tachycardia, unspecified: Secondary | ICD-10-CM

## 2013-11-16 DIAGNOSIS — I472 Ventricular tachycardia: Secondary | ICD-10-CM

## 2013-11-16 LAB — URINALYSIS, ROUTINE W REFLEX MICROSCOPIC
Bilirubin Urine: NEGATIVE
Glucose, UA: NEGATIVE mg/dL
HGB URINE DIPSTICK: NEGATIVE
Ketones, ur: NEGATIVE mg/dL
LEUKOCYTES UA: NEGATIVE
NITRITE: NEGATIVE
Protein, ur: NEGATIVE mg/dL
Specific Gravity, Urine: 1.005 (ref 1.005–1.030)
Urobilinogen, UA: 0.2 mg/dL (ref 0.0–1.0)
pH: 7 (ref 5.0–8.0)

## 2013-11-16 LAB — BASIC METABOLIC PANEL
ANION GAP: 15 (ref 5–15)
BUN: 17 mg/dL (ref 6–23)
CHLORIDE: 101 meq/L (ref 96–112)
CO2: 25 mEq/L (ref 19–32)
CREATININE: 0.84 mg/dL (ref 0.50–1.35)
Calcium: 8.8 mg/dL (ref 8.4–10.5)
GFR calc non Af Amer: 81 mL/min — ABNORMAL LOW (ref >90)
Glucose, Bld: 130 mg/dL — ABNORMAL HIGH (ref 70–99)
POTASSIUM: 3.7 meq/L (ref 3.7–5.3)
SODIUM: 141 meq/L (ref 137–147)

## 2013-11-16 LAB — TROPONIN I

## 2013-11-16 LAB — MAGNESIUM: Magnesium: 1.9 mg/dL (ref 1.5–2.5)

## 2013-11-16 MED ORDER — POLYETHYLENE GLYCOL 3350 17 G PO PACK
17.0000 g | PACK | Freq: Every day | ORAL | Status: DC
  Administered 2013-11-16 – 2013-11-20 (×5): 17 g via ORAL
  Filled 2013-11-16 (×5): qty 1

## 2013-11-16 MED ORDER — POTASSIUM CHLORIDE CRYS ER 20 MEQ PO TBCR
20.0000 meq | EXTENDED_RELEASE_TABLET | Freq: Every day | ORAL | Status: DC
  Administered 2013-11-16 – 2013-11-20 (×5): 20 meq via ORAL
  Filled 2013-11-16 (×5): qty 1

## 2013-11-16 MED ORDER — FUROSEMIDE 10 MG/ML IJ SOLN
40.0000 mg | Freq: Two times a day (BID) | INTRAMUSCULAR | Status: AC
  Administered 2013-11-16 – 2013-11-18 (×5): 40 mg via INTRAVENOUS
  Filled 2013-11-16 (×7): qty 4

## 2013-11-16 NOTE — Care Management Note (Addendum)
    Page 1 of 1   11/21/2013     11:57:13 AM CARE MANAGEMENT NOTE 11/21/2013  Patient:  [name], [name]   Account Number:  [id number]  Date Initiated:  11/16/2013  Documentation initiated by:  [facility]  Subjective/Objective Assessment:   80 y.o. male with a history of HFpEF, ILD, atrial fibrillation, poor dietary adherence, who presents with confusion, dyspnea, coughing and chest tightness.//Home alone.     Action/Plan:   Start 80mg  IV lasix BID.  Place foley given issues with freq urination.  - Continue home diltiazem, digoxin.  - TTE, repeat BMET//Access for disposition needs.   Anticipated DC Date:  11/19/2013   Anticipated DC Plan:  HOME W HOME HEALTH SERVICES      DC Planning Services  CM consult      Choice offered to / List presented to:             Status of service:  Completed, signed off Medicare Important Message given?  YES (If response is "NO", the following Medicare IM given date fields will be blank) Date Medicare IM given:  11/20/2013 Medicare IM given by:  [facility] Date Additional Medicare IM given:   Additional Medicare IM given by:    Discharge Disposition:    Per UR Regulation:  Reviewed for med. necessity/level of care/duration of stay  If discussed at Long Length of Stay Meetings, dates discussed:    Comments:

## 2013-11-16 NOTE — ED Provider Notes (Signed)
Medical screening examination/treatment/procedure(s) were conducted as a shared visit with non-physician practitioner(s) and myself.  I personally evaluated the patient during the encounter.   EKG Interpretation   Date/Time:  Wednesday November 15 2013 21:25:45 EDT Ventricular Rate:  103 PR Interval:    QRS Duration: 113 QT Interval:  361 QTC Calculation: 472 R Axis:   157 Text Interpretation:  Right and left arm electrode reversal,  interpretation assumes no reversal Atrial fibrillation Paired ventricular  premature complexes Aberrant conduction of SV complex(es) Low voltage,  extremity and precordial leads Abnormal lateral Q waves Anteroseptal  infarct, old Minimal ST depression, lateral leads Baseline wander in  lead(s) III No significant change since last tracing Confirmed by [facility]   MD, Alphonzo Lemmings ([id number]) on 11/15/2013 9:37:35 PM      Patient with vague symptoms shortness of breath and chest pain who is somewhat tangential when giving a history but today was found to have signs of fluid overload and elevated troponin. He is acutely in no distress and is able to speak in complete sentences  Gwyneth Sprout, MD 11/16/13 [id number]

## 2013-11-16 NOTE — Progress Notes (Signed)
SUBJECTIVE:  Breathing better  OBJECTIVE:   Vitals:   Filed Vitals:   11/16/13 0449 11/16/13 0452 11/16/13 0916 11/16/13 1434  BP: 129/53 129/53 126/75 107/49  Pulse: 95 95 77   Temp:    98 F (36.7 C)  TempSrc:    Oral  Resp:   18   Height:      Weight:      SpO2: 94% 94% 98% 97%   I&O's:   Intake/Output Summary (Last 24 hours) at 11/16/13 1606 Last data filed at 11/16/13 1135  Gross per 24 hour  Intake    723 ml  Output   6440 ml  Net  -5717 ml   TELEMETRY: Reviewed telemetry pt in atrial fibrillation with CVR     PHYSICAL EXAM General: Well developed, well nourished, in no acute distress Head: Eyes PERRLA, No xanthomas.   Normal cephalic and atramatic  Lungs:   Clear bilaterally to auscultation and percussion. Heart:   Irregularly irregular S1 S2 Pulses are 2+ & equal. Abdomen: Bowel sounds are positive, abdomen soft and non-tender without masses Extremities:   1+ edema bilaterally Neuro: Alert and oriented X 3. Psych:  Good affect, responds appropriately   LABS: Basic Metabolic Panel:  Recent Labs  31/49/70 2139 11/16/13 0445  NA 138 141  K 4.5 3.7  CL 102 101  CO2 22 25  GLUCOSE 119* 130*  BUN 17 17  CREATININE 0.83 0.84  CALCIUM 9.0 8.8  MG  --  1.9   Liver Function Tests: No results found for this basename: AST, ALT, ALKPHOS, BILITOT, PROT, ALBUMIN,  in the last 72 hours No results found for this basename: LIPASE, AMYLASE,  in the last 72 hours CBC:  Recent Labs  11/15/13 2139  WBC 11.8*  NEUTROABS 10.2*  HGB 13.6  HCT 40.4  MCV 96.2  PLT 255   Cardiac Enzymes:  Recent Labs  11/16/13 0445  TROPONINI <0.30   BNP: No components found with this basename: POCBNP,  D-Dimer: No results found for this basename: DDIMER,  in the last 72 hours Hemoglobin A1C: No results found for this basename: HGBA1C,  in the last 72 hours Fasting Lipid Panel: No results found for this basename: CHOL, HDL, LDLCALC, TRIG, CHOLHDL, LDLDIRECT,  in  the last 72 hours Thyroid Function Tests: No results found for this basename: TSH, T4TOTAL, FREET3, T3FREE, THYROIDAB,  in the last 72 hours Anemia Panel: No results found for this basename: VITAMINB12, FOLATE, FERRITIN, TIBC, IRON, RETICCTPCT,  in the last 72 hours Coag Panel:   Lab Results  Component Value Date   INR 1.14 09/19/2010   INR 1.08 09/21/2009   INR 1.08 09/20/2009    RADIOLOGY: Dg Chest 2 View  11/15/2013   CLINICAL DATA:  Congestion and cough. COPD. Confusion. Hypertension.  EXAM: CHEST  2 VIEW  COMPARISON:  Multiple exams, including 08/31/2013  FINDINGS: Abnormal interstitial and patchy airspace opacities throughout the lungs likely due to a combination of usual interstitial pneumonitis and superimposed indistinct airspace processes especially in the left lung. Cardiomegaly. Thoracic spondylosis. No pleural effusion observed. Lumbar spondylosis and degenerative disc disease.  IMPRESSION: 1. We observe a combination of the patient's underlying fairly severe chronic interstitial lung disease (attributed to usual interstitial pneumonitis on prior high-resolution CT workup) with superimposed airspace opacities especially peripherally in the left lung which could represent superimposed pneumonia or asymmetric edema. 2.  Enlargement of the cardiopericardial silhouette.   Electronically Signed   By: Herbie Baltimore M.D.  On: 11/15/2013 22:28   ASSESSMENT AND PLAN:  [name] is a 80 y.o. male with a history of HFpEF, ILD, atrial fibrillation, poor dietary adherence, who presents with confusion, dyspnea, coughing and chest tightness.. EMS/Police called to house by neighbors, where patient was found to be confused and dyspneic. Symptoms have been worsening for several days, but much worse today. Patient is also very concerned about frequent urination. No recent falls. No lightheadedness. +Orthopnea. Lives at home alone.  Labs in ED significant for mildly elevated troponin and a BNP of  4,000. CXR with worsening of chronic airspace opacities on left side. EKG with afib, no acute ischemic changes. Currently chest pain free.   1. Acute on chronic combined systolic/Diastolic Heart Failure Exacerbation - 2D echo shows mild LV dysfunction EF 45-50%.  He has put out 5.7L since admission.  Renal function stable.  Weight down 4 lbs.  Weight at last OV 09/15/2013 with Dr. [place] was 215lbs which was his weight on admit - Decrease to 40mg  IV lasix BID.  - Continue home diltiazem, digoxin.  - Hold BB due to lung disease  2. Elevated POC troponin but serum troponin is normal - No CP currently, EKG benign.  - ASA only for now, monitor enzymes. Also on NOAC for AFib  3. Chronic AFib - Continue Xarelto, diltiazem        4.  Urinary frequency - UA negative       5.  History of tachycardia mediated DCM       6.  Chronic interstitial pulmonary fibrosis       7.  HTN - controlled       8.  NSVT up 22 beats on 10/29 at 4:42am with normal K and Mag.  This is either [facility] or afib with aberration.  Some of the beats are irregular.   He has evidence of 3 vessel ASCAD by chest CT so most likely has underlying CAD.  Will need to decide on invasive vs. Noninvasive ischemic workup.  [facility]. [facility] is his primary Cardiologist and will see him tomorrow so will let decision up to him.  He is not actively wheezing so will consider adding BB.    [place], MD  11/16/2013  4:06 PM

## 2013-11-16 NOTE — Progress Notes (Signed)
Pt had 22 beats of VTach , asymptomatic. Dr. Jon Billings was notified. Will continue to monitor pt  11/16/13 0449  Vitals  BP ! 129/53 mmHg  BP Location Left Arm  BP Method Automatic  Patient Position (if appropriate) Lying  Pulse Rate 95  Oxygen Therapy  SpO2 94 %  O2 Device Nasal Cannula  O2 Flow Rate (L/min) 2 L/min

## 2013-11-16 NOTE — Progress Notes (Signed)
Pt complaining of muscle cramps on both legs, "this is terrible cramps I ever had" verbalized by pt. Dr. Jon Billings was notified. 2 tabs of Hydrocodone given as PRN order and Magnesium lab this AM. Will continue to monitor pt

## 2013-11-16 NOTE — Progress Notes (Signed)
Echo Lab  2D Echocardiogram completed.  [name] L [name], RDCS 11/16/2013 10:30 AM

## 2013-11-16 NOTE — Progress Notes (Signed)
Pt had 8 beats of VTach, denies chest pain, not in respiratory distress, asymptomatic. Dr. Jon Billings was notified, no new orders given at this time. Will continue to monitor pt

## 2013-11-17 DIAGNOSIS — I10 Essential (primary) hypertension: Secondary | ICD-10-CM

## 2013-11-17 DIAGNOSIS — M069 Rheumatoid arthritis, unspecified: Secondary | ICD-10-CM

## 2013-11-17 DIAGNOSIS — J84112 Idiopathic pulmonary fibrosis: Secondary | ICD-10-CM

## 2013-11-17 LAB — BASIC METABOLIC PANEL
ANION GAP: 13 (ref 5–15)
BUN: 23 mg/dL (ref 6–23)
CALCIUM: 8.4 mg/dL (ref 8.4–10.5)
CO2: 26 meq/L (ref 19–32)
Chloride: 100 mEq/L (ref 96–112)
Creatinine, Ser: 0.99 mg/dL (ref 0.50–1.35)
GFR calc Af Amer: 87 mL/min — ABNORMAL LOW (ref >90)
GFR calc non Af Amer: 75 mL/min — ABNORMAL LOW (ref >90)
GLUCOSE: 94 mg/dL (ref 70–99)
POTASSIUM: 4.2 meq/L (ref 3.7–5.3)
Sodium: 139 mEq/L (ref 137–147)

## 2013-11-17 MED ORDER — LOSARTAN POTASSIUM 25 MG PO TABS
25.0000 mg | ORAL_TABLET | Freq: Every day | ORAL | Status: DC
  Administered 2013-11-17 – 2013-11-20 (×4): 25 mg via ORAL
  Filled 2013-11-17 (×4): qty 1

## 2013-11-17 NOTE — Evaluation (Signed)
Physical Therapy Evaluation Patient Details Name: [name] MRN: [id number] DOB: [birth year] Today's Date: 11/17/2013   History of Present Illness  [name] is a 80 y.o. male with a history of HFpEF, ILD, atrial fibrillation, poor dietary adherence, who presents with confusion, dyspnea, coughing and chest tightness..  EMS/Police called to house by neighbors, where patient was found to be confused and dyspneic.   Clinical Impression  Patient presents with decreased independence and safety with mobility due to deficits listed in PT problem list.  He will benefit from skilled PT in the acute setting to allow return home independent after SNF stay.    Follow Up Recommendations SNF (requests [facility])    Equipment Recommendations  None recommended by PT    Recommendations for Other Services       Precautions / Restrictions Precautions Precautions: Fall Precaution Comments: reports only recent fall when chair broke Restrictions Weight Bearing Restrictions: No      Mobility  Bed Mobility Overal bed mobility: Modified Independent                Transfers Overall transfer level: Modified independent                  Ambulation/Gait Ambulation/Gait assistance: Min guard;Supervision Ambulation Distance (Feet): 100 Feet Assistive device: Rolling walker (2 wheeled) Gait Pattern/deviations: Step-through pattern;Decreased stride length;Shuffle;Trunk flexed   Gait velocity interpretation: at or above normal speed for age/gender    Stairs            Wheelchair Mobility    Modified Rankin (Stroke Patients Only)       Balance Overall balance assessment: Needs assistance   Sitting balance-Leahy Scale: Good     Standing balance support: Bilateral upper extremity supported;Single extremity supported;During functional activity Standing balance-Leahy Scale: Poor Standing balance comment: needs UE assist on sink to comb hair due to c/o pain in  feet with full weight                             Pertinent Vitals/Pain Pain Assessment: Faces Faces Pain Scale: Hurts little more Pain Location: feet Pain Descriptors / Indicators: Burning Pain Intervention(s): Monitored during session;Limited activity within patient's tolerance;Other (comment) (UE support on walker)    Home Living Family/patient expects to be discharged to:: Private residence Living Arrangements: Alone   Type of Home: Apartment Home Access: Elevator     Home Layout: One level Home Equipment: Environmental consultant - 4 wheels;Walker - 2 wheels;Toilet riser;Bedside commode (c-pap) Additional Comments: Lives at [facility]    Prior Function Level of Independence: Independent with assistive device(s)               Hand Dominance        Extremity/Trunk Assessment               Lower Extremity Assessment: LLE deficits/detail;RLE deficits/detail RLE Deficits / Details: AROm WFL, strength hip flexion 4-/5, knee extension 4+/5, ankle DF 4+/5 LLE Deficits / Details: AROm WFL, strength hip flexion 4-/5, knee extension 4+/5, ankle DF 4+/5  Cervical / Trunk Assessment: Kyphotic  Communication   Communication: No difficulties  Cognition Arousal/Alertness: Awake/alert Behavior During Therapy: Anxious Overall Cognitive Status: No family/caregiver present to determine baseline cognitive functioning (perseverating on issue with night nurse and slow to complete thoughts; does report start of Alzheimers)  General Comments      Exercises        Assessment/Plan    PT Assessment Patient needs continued PT services  PT Diagnosis Abnormality of gait;Generalized weakness   PT Problem List Decreased strength;Decreased mobility;Decreased safety awareness;Decreased balance;Decreased knowledge of use of DME;Decreased activity tolerance;Decreased cognition  PT Treatment Interventions DME instruction;Therapeutic  activities;Gait training;Therapeutic exercise;Patient/family education;Functional mobility training;Neuromuscular re-education;Balance training   PT Goals (Current goals can be found in the Care Plan section) Acute Rehab PT Goals Patient Stated Goal: To get stronger PT Goal Formulation: With patient Time For Goal Achievement: 12/01/13 Potential to Achieve Goals: Good    Frequency Min 3X/week   Barriers to discharge Decreased caregiver support      Co-evaluation               End of Session Equipment Utilized During Treatment: Gait belt Activity Tolerance: Patient tolerated treatment well Patient left: in chair;with call bell/phone within reach;with nursing/sitter in room Nurse Communication: Other (comment) (Needs to speak with charge nurse )         Time: 1856-3149 PT Time Calculation (min): 40 min   Charges:   PT Evaluation $Initial PT Evaluation Tier I: 1 Procedure PT Treatments $Gait Training: 8-22 mins $Therapeutic Activity: 8-22 mins   PT G Codes:          Daquan Crapps,CYNDI Dec 04, 2013, 10:43 AM  Sheran Lawless, PT [phone] 12/04/13

## 2013-11-17 NOTE — Evaluation (Signed)
Occupational Therapy Evaluation Patient Details Name: [name] MRN: [id number] DOB: Sep 28, 1931 Today's Date: 11/17/2013    History of Present Illness [name] is a 80 y.o. male with a history of HFpEF, ILD, atrial fibrillation, poor dietary adherence, who presents with confusion, dyspnea, coughing and chest tightness..  EMS/Police called to house by neighbors, where patient was found to be confused and dyspneic.    Clinical Impression   Pt was living alone independent in ADL and IADL.  Pt now presents with generalized weakness and decreased activity tolerance interfering with ability to perform ADL safely.  Pt is agreeable to ST rehab in SNF as he states he has been declining for some time. Will defer further OT to SNF.    Follow Up Recommendations  SNF    Equipment Recommendations       Recommendations for Other Services       Precautions / Restrictions Precautions Precautions: Fall Precaution Comments: reports only recent fall when chair broke      Mobility Bed Mobility Overal bed mobility: Modified Independent                Transfers Overall transfer level: Modified independent Equipment used: Rolling walker (2 wheeled)                  Balance Overall balance assessment: Needs assistance   Sitting balance-Leahy Scale: Good     Standing balance support: Bilateral upper extremity supported;Single extremity supported;During functional activity Standing balance-Leahy Scale: Poor Standing balance comment: needs UE assist on sink to comb hair due to c/o pain in feet with full weight                            ADL Overall ADL's : Needs assistance/impaired Eating/Feeding: Independent;Sitting   Grooming: Wash/dry hands;Wash/dry face;Min guard;Standing   Upper Body Bathing: Set up;Sitting   Lower Body Bathing: Min guard;Sit to/from stand   Upper Body Dressing : Set up;Sitting   Lower Body Dressing: Min guard;Sit to/from  stand   Toilet Transfer: Min guard;Ambulation;RW;BSC           Functional mobility during ADLs: Min guard;Rolling walker       Vision                     Perception     Praxis      Pertinent Vitals/Pain Pain Assessment: No/denies pain Faces Pain Scale: Hurts little more Pain Location: feet Pain Descriptors / Indicators: Burning Pain Intervention(s): Monitored during session;Limited activity within patient's tolerance;Other (comment) (UE support on walker)     Hand Dominance Right   Extremity/Trunk Assessment Upper Extremity Assessment Upper Extremity Assessment: Generalized weakness   Lower Extremity Assessment Lower Extremity Assessment: Defer to PT evaluation RLE Deficits / Details: AROm WFL, strength hip flexion 4-/5, knee extension 4+/5, ankle DF 4+/5 LLE Deficits / Details: AROm WFL, strength hip flexion 4-/5, knee extension 4+/5, ankle DF 4+/5   Cervical / Trunk Assessment Cervical / Trunk Assessment: Kyphotic   Communication Communication Communication: No difficulties   Cognition Arousal/Alertness: Awake/alert Behavior During Therapy: Anxious Overall Cognitive Status: No family/caregiver present to determine baseline cognitive functioning (reports early dementia)                     General Comments       Exercises       Shoulder Instructions      Home Living Family/patient expects to  be discharged to:: Private residence Living Arrangements: Alone Available Help at Discharge: Friend(s);Available PRN/intermittently Type of Home: Apartment Home Access: Elevator     Home Layout: One level     Bathroom Shower/Tub: Chief Strategy Officer: Standard     Home Equipment: Environmental consultant - 4 wheels;Walker - 2 wheels;Toilet riser;Bedside commode;Other (comment) (cpap)   Additional Comments: Lives at retirement center [employer]      Prior Functioning/Environment Level of Independence: Independent with assistive device(s)              OT Diagnosis: Generalized weakness;Cognitive deficits   OT Problem List: Decreased strength;Decreased activity tolerance;Impaired balance (sitting and/or standing);Decreased knowledge of use of DME or AE;Decreased cognition   OT Treatment/Interventions:      OT Goals(Current goals can be found in the care plan section) Acute Rehab OT Goals Patient Stated Goal: To get stronger  OT Frequency:     Barriers to D/C:            Co-evaluation              End of Session    Activity Tolerance: Patient limited by fatigue Patient left: in bed;with call bell/phone within reach;with bed alarm set   Time: 1132-1157 OT Time Calculation (min): 25 min Charges:  OT General Charges $OT Visit: 1 Procedure OT Evaluation $Initial OT Evaluation Tier I: 1 Procedure OT Treatments $Self Care/Home Management : 8-22 mins G-Codes:    Evern Bio 11/17/2013, 11:58 AM [phone]

## 2013-11-17 NOTE — Progress Notes (Signed)
SUBJECTIVE:  Breathing better, still has cough productive of some phlegm  OBJECTIVE:   Vitals:   Filed Vitals:   11/17/13 0618 11/17/13 0619 11/17/13 0813 11/17/13 1013  BP: 128/61  148/78 160/98  Pulse: 70  59 92  Temp: 97.6 F (36.4 C)     TempSrc: Oral     Resp: 17     Height:      Weight:  204 lb 6.4 oz (92.715 kg)    SpO2: 93%  94% 97%   I&O's:    Intake/Output Summary (Last 24 hours) at 11/17/13 1108 Last data filed at 11/17/13 1022  Gross per 24 hour  Intake    723 ml  Output   4025 ml  Net  -3302 ml   TELEMETRY: Reviewed telemetry pt in atrial fibrillation with CVR. Some NSVT and one run of AIVR.     PHYSICAL EXAM General: Well developed, well nourished, in no acute distress Head: Eyes PERRLA, No xanthomas.   Normal cephalic and atramatic. JVD to 6 cm  Lungs:   Clear bilaterally to auscultation and percussion. Heart:   Irregularly irregular S1 S2 Pulses are 2+ & equal. Abdomen: Bowel sounds are positive, abdomen soft and non-tender without masses Extremities:   1+ edema bilaterally Neuro: Alert and oriented X 3. Psych:  Good affect, responds appropriately   LABS: Basic Metabolic Panel:  Recent Labs  70/35/00 0445 11/17/13 0333  NA 141 139  K 3.7 4.2  CL 101 100  CO2 25 26  GLUCOSE 130* 94  BUN 17 23  CREATININE 0.84 0.99  CALCIUM 8.8 8.4  MG 1.9  --    Liver Function Tests: No results found for this basename: AST, ALT, ALKPHOS, BILITOT, PROT, ALBUMIN,  in the last 72 hours No results found for this basename: LIPASE, AMYLASE,  in the last 72 hours CBC:  Recent Labs  11/15/13 2139  WBC 11.8*  NEUTROABS 10.2*  HGB 13.6  HCT 40.4  MCV 96.2  PLT 255   Cardiac Enzymes:  Recent Labs  11/16/13 0445  TROPONINI <0.30   Coag Panel:   Lab Results  Component Value Date   INR 1.14 09/19/2010   INR 1.08 09/21/2009   INR 1.08 09/20/2009    RADIOLOGY: Dg Chest 2 View  11/15/2013   CLINICAL DATA:  Congestion and cough. COPD. Confusion.  Hypertension.  EXAM: CHEST  2 VIEW  COMPARISON:  Multiple exams, including 08/31/2013  FINDINGS: Abnormal interstitial and patchy airspace opacities throughout the lungs likely due to a combination of usual interstitial pneumonitis and superimposed indistinct airspace processes especially in the left lung. Cardiomegaly. Thoracic spondylosis. No pleural effusion observed. Lumbar spondylosis and degenerative disc disease.  IMPRESSION: 1. We observe a combination of the patient's underlying fairly severe chronic interstitial lung disease (attributed to usual interstitial pneumonitis on prior high-resolution CT workup) with superimposed airspace opacities especially peripherally in the left lung which could represent superimposed pneumonia or asymmetric edema. 2.  Enlargement of the cardiopericardial silhouette.   Electronically Signed   By: [name] M.D.   On: 11/15/2013 22:28   ASSESSMENT AND PLAN:  [name] is a 80 y.o. male with a history of HFpEF, ILD, atrial fibrillation, poor dietary adherence, who presents with confusion, dyspnea, coughing and chest tightness.. EMS/Police called to house by neighbors, where patient was found to be confused and dyspneic. Symptoms have been worsening for several days, but much worse today. Patient is also very concerned about frequent urination. No recent falls.  No lightheadedness. +Orthopnea. Lives at home alone.  Labs in ED significant for mildly elevated troponin and a BNP of 4,000. CXR with worsening of chronic airspace opacities on left side. EKG with afib, no acute ischemic changes. Currently chest pain free.   1. Acute on chronic combined systolic/Diastolic Heart Failure Exacerbation - 2D echo shows mild LV dysfunction EF 45-50%.  He has put out 5.7L since admission.  Renal function stable.  Weight down 11 lbs.   - Continue 40mg  IV lasix BID. Expect to transition to po lasix this weekend - Continue home diltiazem, digoxin. Check Dig level. - Start  losartan 25 mg daily. - Hold BB due to lung disease  2. Elevated POC troponin but serum troponin is normal - No CP currently, EKG benign.  - ASA only for now, monitor enzymes. Also on Xarelto for AFib  3. Chronic AFib - Continue Xarelto, diltiazem        4.  Urinary frequency - UA negative       5.  History of tachycardia mediated DCM       6.  Chronic interstitial pulmonary fibrosis       7.  HTN - controlled       8.  NSVT asymptomatic. Will observe for now. Patient had a cardiac cath in 2011 which showed no significant CAD.        9. Dementia. Patient realizes that he is having a difficult time caring for himself at home. He knows he doesn't always take his medications right despite pill box. He has increased salt intake with diet.  I anticipate transition to po lasix this weekend. Case management consult placed. Patient willing to consider NH placement at least for a short time. Has been to [facility] in the past and is willing to return.     [name] [facility], MD  11/17/2013  11:08 AM

## 2013-11-17 NOTE — Plan of Care (Signed)
Problem: Phase I Progression Outcomes Goal: Voiding-avoid urinary catheter unless indicated Outcome: Not Progressing Indwelling foley continues for strict I&Os.  See doc flowsheets for measurements.  Patient continues to have short runs of vtach this shift.  Monitoring telemetry.  He is asymptomatic.  Will continue to monitor patient's condition.

## 2013-11-18 LAB — BASIC METABOLIC PANEL
ANION GAP: 13 (ref 5–15)
BUN: 26 mg/dL — ABNORMAL HIGH (ref 6–23)
CHLORIDE: 99 meq/L (ref 96–112)
CO2: 26 meq/L (ref 19–32)
CREATININE: 0.88 mg/dL (ref 0.50–1.35)
Calcium: 8.4 mg/dL (ref 8.4–10.5)
GFR calc non Af Amer: 79 mL/min — ABNORMAL LOW (ref >90)
Glucose, Bld: 71 mg/dL (ref 70–99)
POTASSIUM: 4.1 meq/L (ref 3.7–5.3)
SODIUM: 138 meq/L (ref 137–147)

## 2013-11-18 LAB — DIGOXIN LEVEL: DIGOXIN LVL: 0.7 ng/mL — AB (ref 0.8–2.0)

## 2013-11-18 MED ORDER — FUROSEMIDE 40 MG PO TABS
40.0000 mg | ORAL_TABLET | Freq: Two times a day (BID) | ORAL | Status: DC
  Administered 2013-11-19 – 2013-11-20 (×4): 40 mg via ORAL
  Filled 2013-11-18 (×5): qty 1

## 2013-11-18 NOTE — Progress Notes (Signed)
       Patient Name: [name] Date of Encounter: 11/18/2013    SUBJECTIVE: The patient is breathing better. Lower extremity swelling continues to be an issue but is improving. He has had difficulty with compliance to medical regimen. He lives alone and has difficulty taking care of himself. TELEMETRY:  Atrial fibrillation with controlled ventricular response Filed Vitals:   11/17/13 2011 11/18/13 0533 11/18/13 1029 11/18/13 1030  BP: 128/61 130/66  124/61  Pulse: 84 82 77   Temp: 98 F (36.7 C) 97.5 F (36.4 C)    TempSrc: Oral Oral    Resp: 18 18    Height:      Weight:  203 lb 4.8 oz (92.216 kg)    SpO2: 98% 96%      Intake/Output Summary (Last 24 hours) at 11/18/13 1134 Last data filed at 11/18/13 0930  Gross per 24 hour  Intake    480 ml  Output   1080 ml  Net   -600 ml   LABS: Basic Metabolic Panel:  Recent Labs  27/78/24 0445 11/17/13 0333 11/18/13 0403  NA 141 139 138  K 3.7 4.2 4.1  CL 101 100 99  CO2 25 26 26   GLUCOSE 130* 94 71  BUN 17 23 26*  CREATININE 0.84 0.99 0.88  CALCIUM 8.8 8.4 8.4  MG 1.9  --   --    CBC:  Recent Labs  11/15/13 2139  WBC 11.8*  NEUTROABS 10.2*  HGB 13.6  HCT 40.4  MCV 96.2  PLT 255   Cardiac Enzymes:  Recent Labs  11/16/13 0445  TROPONINI <0.30   I/O: Since admission -8.2 L  Radiology/Studies:  Chest x-ray on admission on 11/15/13 showed CHF  Physical Exam: Blood pressure 124/61, pulse 77, temperature 97.5 F (36.4 C), temperature source Oral, resp. rate 18, height 5\' 8"  (1.727 m), weight 203 lb 4.8 oz (92.216 kg), SpO2 96.00%. Weight change: -1 lb 1.6 oz (-0.499 kg)  Wt Readings from Last 3 Encounters:  11/18/13 203 lb 4.8 oz (92.216 kg)  10/02/13 218 lb 4 oz (98.998 kg)  09/15/13 216 lb (97.977 kg)   Minimal neck vein elevation Faint basilar rales Irregularly irregular rhythm without gallop Chronic lichenification of lower extremities with 2+ edema from ankles to knees  ASSESSMENT:  1.  Acute on chronic combined systolic and diastolic heart failure precipitated by poor compliance with medical regimen 2. Dementia 3. Chronic atrial fibrillation with controlled rate 4. Atrial fibrillation with controlled rate  Plan:  1. Will switch to oral diuretic regimen in a.m., furosemide 40 mg by mouth twice a day 2. Case management to assist with placement in assisted living, patient prefers [facility] 3. Continue to monitor renal function  Signed, 09/17/13 11/18/2013, 11:34 AM

## 2013-11-18 NOTE — Progress Notes (Signed)
Patient had 14 beats of V-tach, asymptomatic, resting in bed quietly.

## 2013-11-19 LAB — BASIC METABOLIC PANEL
Anion gap: 13 (ref 5–15)
BUN: 25 mg/dL — ABNORMAL HIGH (ref 6–23)
CO2: 25 meq/L (ref 19–32)
CREATININE: 0.9 mg/dL (ref 0.50–1.35)
Calcium: 8.9 mg/dL (ref 8.4–10.5)
Chloride: 100 mEq/L (ref 96–112)
GFR calc non Af Amer: 78 mL/min — ABNORMAL LOW (ref >90)
Glucose, Bld: 112 mg/dL — ABNORMAL HIGH (ref 70–99)
POTASSIUM: 4.1 meq/L (ref 3.7–5.3)
Sodium: 138 mEq/L (ref 137–147)

## 2013-11-19 MED ORDER — DILTIAZEM HCL ER COATED BEADS 300 MG PO CP24
300.0000 mg | ORAL_CAPSULE | Freq: Every day | ORAL | Status: DC
  Administered 2013-11-20: 300 mg via ORAL
  Filled 2013-11-19: qty 1

## 2013-11-19 NOTE — Progress Notes (Signed)
Dr. Katrinka Blazing notified this AM as patient's HR elevates to 130s-150s when patient sitting, standing, or eating.  At rest patient's HR 70s-100s.  Orders for cardizem updated.  Patient asymptomatic.  Will continue to monitor.

## 2013-11-20 ENCOUNTER — Other Ambulatory Visit: Payer: Self-pay | Admitting: Physician Assistant

## 2013-11-20 DIAGNOSIS — I5031 Acute diastolic (congestive) heart failure: Secondary | ICD-10-CM

## 2013-11-20 DIAGNOSIS — I248 Other forms of acute ischemic heart disease: Secondary | ICD-10-CM

## 2013-11-20 DIAGNOSIS — I5033 Acute on chronic diastolic (congestive) heart failure: Secondary | ICD-10-CM

## 2013-11-20 MED ORDER — HYDROCODONE-ACETAMINOPHEN 5-500 MG PO TABS: 0.5000 | ORAL_TABLET | Freq: Four times a day (QID) | ORAL | Status: DC | PRN

## 2013-11-20 MED ORDER — POLYETHYLENE GLYCOL 3350 17 G PO PACK: 17.0000 g | PACK | Freq: Every day | ORAL | Status: DC

## 2013-11-20 MED ORDER — ASPIRIN 81 MG PO TBEC: 81.0000 mg | DELAYED_RELEASE_TABLET | Freq: Every day | ORAL | Status: DC

## 2013-11-20 MED ORDER — POTASSIUM CHLORIDE CRYS ER 20 MEQ PO TBCR: 20.0000 meq | EXTENDED_RELEASE_TABLET | Freq: Every day | ORAL | Status: DC

## 2013-11-20 MED ORDER — PREDNISONE 5 MG PO TABS: 5.0000 mg | ORAL_TABLET | Freq: Every day | ORAL | Status: DC

## 2013-11-20 MED ORDER — FUROSEMIDE 40 MG PO TABS: 40.0000 mg | ORAL_TABLET | Freq: Two times a day (BID) | ORAL | Status: DC

## 2013-11-20 MED ORDER — ACETAMINOPHEN 325 MG PO TABS: 650.0000 mg | ORAL_TABLET | ORAL | Status: DC | PRN

## 2013-11-20 MED ORDER — DILTIAZEM HCL ER COATED BEADS 300 MG PO CP24: 300.0000 mg | ORAL_CAPSULE | Freq: Every day | ORAL | Status: DC

## 2013-11-20 MED ORDER — LOSARTAN POTASSIUM 25 MG PO TABS: 25.0000 mg | ORAL_TABLET | Freq: Every day | ORAL | Status: DC

## 2013-11-20 NOTE — Progress Notes (Signed)
~!  1800 to [facility] via [employer]

## 2013-11-20 NOTE — Progress Notes (Signed)
1730 report given to Kary Kos from [facility]

## 2013-11-20 NOTE — Discharge Summary (Signed)
CARDIOLOGY DISCHARGE SUMMARY   Patient ID: [name] MRN: [id number] DOB/AGE: 1931/07/10 80 y.o.  Admit date: 11/15/2013 Discharge date: 11/20/2013  PCP: [name], MD Primary Cardiologist: Dr. Swaziland  Primary Discharge Diagnosis:  Acute on chronic diastolic CHF (congestive heart failure) - weight 202 pounds at discharge  Secondary Discharge Diagnosis:    Essential hypertension   Atrial flutter   Chronic a-fib   Chronic diastolic heart failure   Acute diastolic congestive heart failure   Wide-complex tachycardia   Demand ischemia of myocardium -   Procedures: 2-D echocardiogram  Hospital Course: [name] is a 80 y.o. male with a history of HFpEF, ILD, atrial fibrillation, poor dietary adherence and dementia who was admitted on 10/28 for confusion, shortness of breath and chest tightness. He was volume overloaded by exam and by chest x-ray. He was admitted for further evaluation and treatment.  He was in congestive heart failure. He was diuresed with IV Lasix and his respiratory status improved as his volume status improved. He diuresed 9.8 L. By discharge, he was not requiring oxygen. His chest x-ray was felt to represent edema, asymmetric. He only had a mild elevation in his white count and this was felt secondary to his CHF.  A 2-D echocardiogram was performed and showed some left ventricular dysfunction, with diffuse hypokinesis. His initial point-of-care troponin was slightly elevated but subsequent troponin I was negative. He did not have any chest pain. His symptoms are not felt to represent angina and the elevated point-of-care troponin was felt to represent either a lab error or demand ischemia. No further workup was indicated. He was started on ARB and is to maintain his diuretic dosing. His heart rate was stable on Cardizem and no beta blocker was used because his blood pressure would not tolerate both.  His heart rate was followed on telemetry. He  remained in atrial fibrillation and was continued on his Xarelto for anticoagulation. His heart rate was poorly controlled with ambulation or any activity. The diltiazem was increased from 180 mg daily to 300 mg daily with improved heart rate control. Telemetry was reviewed and showed no prolonged pauses or significant bradycardia.  He was noted to have some brief runs of nonsustained VT on telemetry. His potassium was supplemented and was 4.1 at discharge. This will need to be followed closely as an outpatient. He was asymptomatic with the arrhythmia.  His blood pressure was elevated on admission but improved with diuresis and with the increasing his Cardizem dose. He is to continue current medications.  There was concern for his safety living alone. He also admitted to noncompliance with medications and dietary restrictions. He was willing to go to a nursing facility upon discharge. He was seen by physical therapy and skilled nursing facility placement was recommended. The patient selected [facility] and arrangements were made.  On 11/02, the patient was seen by Dr. Rennis Golden and all data were reviewed. His medication list was felt to be appropriate and no changes were indicated at this time. His respiratory status is felt to be at baseline. No further inpatient workup is indicated he is considered stable for discharge, to follow up as an outpatient.  He is to get a TOC appointment and a BMET next week.   Labs:   Lab Results  Component Value Date   WBC 11.8* 11/15/2013   HGB 13.6 11/15/2013   HCT 40.4 11/15/2013   MCV 96.2 11/15/2013   PLT 255 11/15/2013  Recent Labs Lab 11/19/13 0350  NA 138  K 4.1  CL 100  CO2 25  BUN 25*  CREATININE 0.90  CALCIUM 8.9  GLUCOSE 112*   Lab Results  Component Value Date   TROPONINI <0.30 11/16/2013   TROPONIN I, POC  Date Value Ref Range Status  11/15/2013 0.24* 0.00 - 0.08 ng/mL Final   PRO B NATRIURETIC PEPTIDE (BNP)  Date/Time Value  Ref Range Status  11/15/2013 09:39 PM 4542.0* 0 - 450 pg/mL Final  09/30/2012 03:30 PM 172.0* 0.0 - 100.0 pg/mL Final      Radiology: Dg Chest 2 View 11/15/2013   CLINICAL DATA:  Congestion and cough. COPD. Confusion. Hypertension.  EXAM: CHEST  2 VIEW  COMPARISON:  Multiple exams, including 08/31/2013  FINDINGS: Abnormal interstitial and patchy airspace opacities throughout the lungs likely due to a combination of usual interstitial pneumonitis and superimposed indistinct airspace processes especially in the left lung. Cardiomegaly. Thoracic spondylosis. No pleural effusion observed. Lumbar spondylosis and degenerative disc disease.  IMPRESSION: 1. We observe a combination of the patient's underlying fairly severe chronic interstitial lung disease (attributed to usual interstitial pneumonitis on prior high-resolution CT workup) with superimposed airspace opacities especially peripherally in the left lung which could represent superimposed pneumonia or asymmetric edema. 2.  Enlargement of the cardiopericardial silhouette.   Electronically Signed   By: Herbie Baltimore M.D.   On: 11/15/2013 22:28   EKG: atrial fibrillation, rapid ventricular response at times  Echocardiogram 11/16/2013: Study Conclusions - Left ventricle: The cavity size was normal. Asymmetric septal hypertrophy noted. Systolic function was mildly reduced. The estimated ejection fraction was in the range of 45% to 50%. Diffuse hypokinesis. Indeterminant diastolic function (atrial fibrillation). Septal thickness, ED (2D): 22 mm. Posterior wall thickness, ED (2D): 14.6 mm. - Aortic valve: There was no stenosis. - Mitral valve: Mildly calcified annulus. Mildly calcified leaflets . There was trivial regurgitation. - Left atrium: The atrium was mildly dilated. - Right ventricle: The cavity size was normal. Systolic function was mildly reduced. - Right atrium: The atrium was mildly dilated. - Tricuspid valve: Peak  RV-RA gradient (S): 26 mm Hg. - Pulmonary arteries: PA peak pressure: 29 mm Hg (S). - Inferior vena cava: The vessel was normal in size. The respirophasic diameter changes were in the normal range (= 50%), consistent with normal central venous pressure. Impressions: - Normal LV size with asymmetric septal hypertrophy noted (no mitral valve SAM or LVOT gradient). EF 45-50% with diffuse hypokinesis. Normal RV size and systolic function. No significant valvular abnormalities.  FOLLOW UP PLANS AND APPOINTMENTS Allergies  Allergen Reactions  . Methotrexate     REACTION: ONLY HAS ONE KIDNEY     Medication List    TAKE these medications        acetaminophen 325 MG tablet  Commonly known as:  TYLENOL  Take 2 tablets (650 mg total) by mouth every 4 (four) hours as needed for headache or mild pain.     aspirin 81 MG EC tablet  Take 1 tablet (81 mg total) by mouth daily.     Coral Calcium 1000 (390 CA) MG Tabs  Take 2 capsules by mouth.     digoxin 0.125 MG tablet  Commonly known as:  LANOXIN  Take 1 tablet (0.125 mg total) by mouth daily.     diltiazem 300 MG 24 hr capsule  Commonly known as:  CARDIZEM CD  Take 1 capsule (300 mg total) by mouth daily. I capsule PO daily  furosemide 40 MG tablet  Commonly known as:  LASIX  Take 1 tablet (40 mg total) by mouth 2 (two) times daily.     gabapentin 300 MG capsule  Commonly known as:  NEURONTIN  TAKE 2 CAPSULES BY MOUTH AT BEDTIME     guaiFENesin 600 MG 12 hr tablet  Commonly known as:  MUCINEX  Take 600 mg by mouth 2 (two) times daily.     HYDROcodone-acetaminophen 5-500 MG per tablet  Commonly known as:  VICODIN  Take 0.5-1 tablets by mouth every 6 (six) hours as needed for pain.     leflunomide 10 MG tablet  Commonly known as:  ARAVA  Take 20 mg by mouth daily.     losartan 25 MG tablet  Commonly known as:  COZAAR  Take 1 tablet (25 mg total) by mouth daily.     Melatonin 5 MG Caps  Take 1 capsule by  mouth at bedtime.     memantine 10 MG tablet  Commonly known as:  NAMENDA  Take 1 tablet (10 mg total) by mouth daily.     multivitamin tablet  Take 1 tablet by mouth daily.     NON FORMULARY  at bedtime. c pap     Omega-3 1000 MG Caps  Take by mouth 2 (two) times daily.     omeprazole 20 MG capsule  Commonly known as:  PRILOSEC  Take 1 capsule (20 mg total) by mouth 2 (two) times daily.     polyethylene glycol packet  Commonly known as:  MIRALAX / GLYCOLAX  Take 17 g by mouth daily. Hold for diarrhea or loose stools.     potassium chloride SA 20 MEQ tablet  Commonly known as:  K-DUR,KLOR-CON  Take 1 tablet (20 mEq total) by mouth daily.     predniSONE 5 MG tablet  Commonly known as:  DELTASONE  Take 1 tablet (5 mg total) by mouth daily with breakfast.     rivaroxaban 20 MG Tabs tablet  Commonly known as:  XARELTO  Take 1 tablet (20 mg total) by mouth daily with supper.     vitamin C 500 MG tablet  Commonly known as:  ASCORBIC ACID  Take 500 mg by mouth daily.     Vitamin D3 1000 UNITS Caps  Take 1 capsule by mouth.        Discharge Instructions    Diet - low sodium heart healthy    Complete by:  As directed      Increase activity slowly    Complete by:  As directed           Follow-up Information    Follow up with [name], MD.   Specialty:  Cardiology   Why:  The office will call.   Contact information:   [address] [place] [address] [phone]       BRING ALL MEDICATIONS WITH YOU TO FOLLOW UP APPOINTMENTS  Time spent with patient to include physician time: 48 min Signed: Theodore Demark, PA-C 11/20/2013, 4:10 PM Co-Sign MD

## 2013-11-20 NOTE — Progress Notes (Signed)
Physical Therapy Treatment Patient Details Name: [name] MRN: [id number] DOB: February 27, 1931 Today's Date: 11/20/2013    History of Present Illness [name] is a 80 y.o. male with a history of HFpEF, ILD, atrial fibrillation, poor dietary adherence, who presents with confusion, dyspnea, coughing and chest tightness..  EMS/Police called to house by neighbors, where patient was found to be confused and dyspneic.     PT Comments    Patient progressing with mobility but continues to require some assist for functional tasks.  Patient ambulated with multiple extended rest breaks and max cues for relaxation and posture during ambulation.  Patient assisted with peri-care and hygiene after BM.  Will continue to see and progress as tolerated.   Follow Up Recommendations  SNF (requests [facility])     Equipment Recommendations  None recommended by PT    Recommendations for Other Services       Precautions / Restrictions Precautions Precautions: Fall Precaution Comments: history of falls Restrictions Weight Bearing Restrictions: No    Mobility  Bed Mobility Overal bed mobility: Modified Independent                Transfers Overall transfer level: Modified independent Equipment used: Rolling walker (2 wheeled)             General transfer comment: performed x3 from [facility], once from bed  Ambulation/Gait Ambulation/Gait assistance: Min guard Ambulation Distance (Feet): 110 Feet (performed x2 with multiple standing rest breaks required ) Assistive device: Rolling walker (2 wheeled) Gait Pattern/deviations: Step-through pattern;Decreased stride length;Shuffle;Trunk flexed     General Gait Details: some instability noted, but able to progress despite LE weakness noted bilaterally.   Stairs            Wheelchair Mobility    Modified Rankin (Stroke Patients Only)       Balance           Standing balance support: Bilateral upper extremity  supported Standing balance-Leahy Scale: Poor Standing balance comment: heavy reliance on UE support and RW                    Cognition Arousal/Alertness: Awake/alert Behavior During Therapy: WFL for tasks assessed/performed Overall Cognitive Status: No family/caregiver present to determine baseline cognitive functioning (reports early dementia)                      Exercises  LE ankle ROM and LAQs with cues    General Comments General comments (skin integrity, edema, etc.): patient with +BM on BSC during session, assisted with hygiene and pericare.       Pertinent Vitals/Pain Pain Assessment: 0-10 Pain Score: 4  Pain Location: LEs Pain Descriptors / Indicators: Aching    Home Living                      Prior Function            PT Goals (current goals can now be found in the care plan section) Acute Rehab PT Goals Patient Stated Goal: to get stronger PT Goal Formulation: With patient Time For Goal Achievement: 12/01/13 Potential to Achieve Goals: Good Progress towards PT goals: Progressing toward goals    Frequency  Min 3X/week    PT Plan Current plan remains appropriate    Co-evaluation             End of Session Equipment Utilized During Treatment: Gait belt Activity Tolerance: Patient tolerated treatment well  Patient left: in chair;with call bell/phone within reach;with chair alarm set     Time: [phone] PT Time Calculation (min): 27 min  Charges:  $Gait Training: 8-22 mins $Therapeutic Activity: 8-22 mins                    G CodesFabio Asa 23-Nov-2013, 11:59 AM Charlotte Crumb, PT DPT  [phone]

## 2013-11-20 NOTE — Clinical Social Work Placement (Signed)
Clinical Social Work Department CLINICAL SOCIAL WORK PLACEMENT NOTE 11/20/2013  Patient:  Nicholas, Moran  Account Number:  [id number] Admit date:  11/15/2013  Clinical Social Worker:  Lelon Mast Eason Housman, CLINICAL SOCIAL WORKER  Date/time:  11/20/2013 05:00 PM  Clinical Social Work is seeking post-discharge placement for this patient at the following level of care:   SKILLED NURSING   (*CSW will update this form in Epic as items are completed)   11/20/2013  Patient/family provided with [facility] Department of Clinical Social Work's list of facilities offering this level of care within the geographic area requested by the patient (or if unable, by the patient's family).  11/20/2013  Patient/family informed of their freedom to choose among providers that offer the needed level of care, that participate in Medicare, Medicaid or managed care program needed by the patient, have an available bed and are willing to accept the patient.  11/20/2013  Patient/family informed of [facility]' ownership interest in [facility], as well as of the fact that they are under no obligation to receive care at this facility.  PASARR submitted to EDS on  PASARR number received on   FL2 transmitted to all facilities in geographic area requested by pt/family on  11/20/2013 FL2 transmitted to all facilities within larger geographic area on   Patient informed that his/her managed care company has contracts with or will negotiate with  certain facilities, including the following:   has existing PASARR     Patient/family informed of bed offers received:  11/20/2013 Patient chooses bed at [facility] Physician recommends and patient chooses bed at    [facility] on  11/20/2013 Patient to be transferred to facility by ambulance Patient and family notified of transfer on 11/20/2013 Name of family member notified:  Chapman Fitch-  sister  The following physician request were entered in Epic: Physician Request  Please sign FL2.  Please prepare priority discharge summary, including medications    Additional Comments: 11/20/2013 per MD pt is stable for d/c to SNF today. Pt ad sister are agreeable. RN notified to call report. No further SW needs identified. SW signing off. [name]. BSW Intern, [phone]

## 2013-11-20 NOTE — Progress Notes (Signed)
Belongigs from security and pharmacy given to pt  As signed as acknowledged by pt

## 2013-11-20 NOTE — Psychosocial Assessment (Signed)
Clinical Social Work Department BRIEF PSYCHOSOCIAL ASSESSMENT 11/20/2013  Patient:  [name], [name]     Account Number:  [id number]     Admit date:  11/15/2013  Clinical Social Worker:  Manya Silvas  Date/Time:  11/20/2013 04:30 PM  Referred by:  Physician  Date Referred:  11/20/2013 Referred for  Psychosocial assessment   Other Referral:   Interview type:  Patient Other interview type:   SW talked with Patient about the need for SNF placement.    PSYCHOSOCIAL DATA Living Status:  ALONE Admitted from facility:   Level of care:  Skilled Nursing Facility Primary support name:  Oren Binet Primary support relationship to patient:  SIBLING Degree of support available:   Sister is very supportive and can be reached at [phone].    CURRENT CONCERNS Current Concerns  Post-Acute Placement   Other Concerns:    SOCIAL WORK ASSESSMENT / PLAN SW talked with patient about the need for SNF placement. At first the pt was hesitant about going to a SNF. Then after expressing his concerns he stated that he would not be able to get the help he needs at home. Pt was agreeable to go to [facility] for rehab. PT expresses concerns about his urinary incontinence and that he needs assistance. SW discusses SNF units in [place] and defers all units excepts [facility]. FL2 completed and MD signed. Per MD pt is stable for d/c today.   Assessment/plan status:  Psychosocial Support/Ongoing Assessment of Needs Other assessment/ plan:   SW sent out bed offer to [facility]. Admissions has replied and stated that there was a bed available for the pt. Pt will plan to discharge today to [facility].   Information/referral to community resources:    PATIENT'S/FAMILY'S RESPONSE TO PLAN OF CARE: Patient was hesitant at first about going to a SNF. He stated that he was not going to be happy about placement but he needed it and did not have any other way to get the help that he  needs. Pt did not have a lot  of eye contact. Patient admitted that he is forgetful at times and that he has alzheimer' s (not diagnoses). Phone call made to pt's sister per his request and she was pleased that he agreed to SNF placement.  [employer], BSW Intern, [phone]

## 2013-11-20 NOTE — Progress Notes (Signed)
Patient Name: LAQUINCY EASTRIDGE Date of Encounter: 11/20/2013  Principal Problem:   Acute on chronic diastolic CHF (congestive heart failure) Active Problems:   Acute diastolic congestive heart failure   Wide-complex tachycardia   Demand ischemia of myocardium - possible    Patient Profile: Prosper Paff is a 80 year old Caucasian male with PMH of Diastolic CHF (EF 26-94%), atrial fibrillation, Pulmonary Fibrosis, HTN, and HLD who presented to [facility] ED on 11/15/2013 for confusion, dyspnea, coughing, and chest tightness. Cardiology following for afib, CHF.  SUBJECTIVE: Patient denies any chest pain or palpitations. Says he still experiences shortness of breath when walking. Patient mainly focused on his urinary symptoms during our discussion.   OBJECTIVE Filed Vitals:   11/19/13 2131 11/20/13 0427 11/20/13 1015 11/20/13 1017  BP: 103/63 146/68  152/66  Pulse: 78 74 94   Temp: 98.7 F (37.1 C) 97.5 F (36.4 C)    TempSrc: Oral Oral    Resp: 18 18    Height:      Weight:  202 lb (91.627 kg)    SpO2: 95% 96%      Intake/Output Summary (Last 24 hours) at 11/20/13 1059 Last data filed at 11/20/13 0918  Gross per 24 hour  Intake    840 ml  Output   1550 ml  Net   -710 ml   Filed Weights   11/18/13 0533 11/19/13 0553 11/20/13 0427  Weight: 203 lb 4.8 oz (92.216 kg) 201 lb 3.2 oz (91.264 kg) 202 lb (91.627 kg)    PHYSICAL EXAM General: Well developed, well nourished, male in no acute distress. Head: Normocephalic, atraumatic.  Neck: Supple without bruits, JVD elevated to 6-7cm. Lungs:  Resp regular and unlabored, CTA without wheezing or rales. Heart: Irregulary irregular rhythm. S1, S2, no S3, S4, No murmur; no rub. Abdomen: Soft, non-tender, non-distended, BS + x 4.  Extremities: No clubbing, no cyanosis, 1+ pitting edema up to knees bilaterally.  Distal pulses 2+. Neuro: Alert and oriented X 3. Moves all extremities spontaneously. Psych: Normal  affect.  LABS:  Basic Metabolic Panel:  Recent Labs  85/46/27 0403 11/19/13 0350  NA 138 138  K 4.1 4.1  CL 99 100  CO2 26 25  GLUCOSE 71 112*  BUN 26* 25*  CREATININE 0.88 0.90  CALCIUM 8.4 8.9   BNP: PRO B NATRIURETIC PEPTIDE (BNP)  Date/Time Value Ref Range Status  11/15/2013 09:39 PM 4542.0* 0 - 450 pg/mL Final  09/30/2012 03:30 PM 172.0* 0.0 - 100.0 pg/mL Final   TROPONIN I  Date Value Ref Range Status  11/16/2013 <0.30 <0.30 ng/mL Final   TELE: Atrial fibrillation with several episodes of PVC's. Episodes of ventricular tachycardia also noted (patient asymptomatic during those times).        Radiology/Studies: CXR: 11/15/2013: 1. We observe a combination of the patient's underlying fairly severe chronic interstitial lung disease (attributed to usual interstitial pneumonitis on prior high-resolution CT workup) with superimposed airspace opacities especially peripherally in the left lung which could represent superimposed pneumonia or asymmetric edema. 2. Enlargement of the cardiopericardial silhouette.  Echocardiogram 11/16/2013: Study Conclusions - Left ventricle: The cavity size was normal. Asymmetric septal hypertrophy noted. Systolic function was mildly reduced. The estimated ejection fraction was in the range of 45% to 50%. Diffuse hypokinesis. Indeterminant diastolic function (atrial fibrillation). Septal thickness, ED (2D): 22 mm. Posterior wall thickness, ED (2D): 14.6 mm. - Aortic valve: There was no stenosis. - Mitral valve: Mildly calcified annulus. Mildly calcified  leaflets . There was trivial regurgitation. - Left atrium: The atrium was mildly dilated. - Right ventricle: The cavity size was normal. Systolic function was mildly reduced. - Right atrium: The atrium was mildly dilated. - Tricuspid valve: Peak RV-RA gradient (S): 26 mm Hg. - Pulmonary arteries: PA peak pressure: 29 mm Hg (S). - Inferior vena cava: The vessel was  normal in size. The respirophasic diameter changes were in the normal range (= 50%), consistent with normal central venous pressure.  Impressions: - Normal LV size with asymmetric septal hypertrophy noted (no mitral valve SAM or LVOT gradient). EF 45-50% with diffuse hypokinesis. Normal RV size and systolic function. No significant valvular abnormalities.  Current Medications:  . aspirin EC  81 mg Oral Daily  . digoxin  0.125 mg Oral Daily  . diltiazem  300 mg Oral Daily  . furosemide  40 mg Oral BID  . guaiFENesin  600 mg Oral BID  . losartan  25 mg Oral Daily  . memantine  10 mg Oral Daily  . pantoprazole  40 mg Oral Daily  . polyethylene glycol  17 g Oral Daily  . potassium chloride  20 mEq Oral Daily  . predniSONE  5 mg Oral Q breakfast  . rivaroxaban  20 mg Oral Q supper  . sodium chloride  3 mL Intravenous Q12H   ASSESSMENT AND PLAN:  1. Acute on chronic diastolic CHF (congestive heart failure) - Echocardiogram on 11/16/2013 showed EF of 45-50% with diffuse hypokinesis (lower than 2011 echo). Net output since admission is 9.8 L. Weight on admission was 215 lbs, now 202 lbs on 11/20/2013. - Continue Digoxin 0.125 mg daily. - Switched to PO Lasix on 11/19/2013. Continue Lasix 40mg  PO BID. Weight 201-->202 after changed to PO Lasix, may need higher dose. Consider 80 mg am, 40 mg pm.   2. Demand ischemia - possible - Initial POC Troponin mildly elevated in ED but serum troponin normal. ECG showed no acute changes. - Patient's clinical picture without chest pain, palpitations, or chest pressure.  3. Atrial Fibrillation - Continue Xarelto 20mg  daily. - Continue Diltiazem 300mg  daily. Patient's dosage increased on 11/19/2013 from 180mg  to 300mg  due to episodes of tachycardia in the 130-150's when sitting, standing,or eating.  4. Hypertension - Continue Losartan 25mg  daily and Cardizem, lowest SBP last 24 hours was 103.   4. Urinary Incontinence - Urinalysis on  11/15/2013 was normal. - Patient currently has catheter due to monitoring of I/O's.   5. Dementia - Patient endorsed medication non-compliance. Willing to go to Nursing Facility upon discharge for the short-term. PT and OT both recommend SNF at this time. Patient prefers [facility].   Signed, Patient seen and examined with PA-S 13/01/2013. Changes made where appropriate.  , PA-C 10:59 AM 11/20/2013

## 2013-11-22 ENCOUNTER — Telehealth: Payer: Self-pay | Admitting: Pulmonary Disease

## 2013-11-22 NOTE — Telephone Encounter (Signed)
Called spoke with [place]. She wanted to confirm we have pt recods stating pt CPAp iss et at 9 cm. I advised her according to last note, this is so. Nothing further needed

## 2013-11-27 ENCOUNTER — Non-Acute Institutional Stay (SKILLED_NURSING_FACILITY): Payer: Medicare Other | Admitting: Internal Medicine

## 2013-11-27 DIAGNOSIS — I5023 Acute on chronic systolic (congestive) heart failure: Secondary | ICD-10-CM

## 2013-11-27 DIAGNOSIS — I482 Chronic atrial fibrillation, unspecified: Secondary | ICD-10-CM

## 2013-11-27 DIAGNOSIS — G4730 Sleep apnea, unspecified: Secondary | ICD-10-CM

## 2013-11-27 DIAGNOSIS — J8410 Pulmonary fibrosis, unspecified: Secondary | ICD-10-CM

## 2013-11-29 NOTE — Progress Notes (Addendum)
Patient ID: [name], male   DOB: 1931/10/15, 80 y.o.   MRN: [id number]               HISTORY & PHYSICAL  DATE:  11/27/2013    FACILITY: [facility]    LEVEL OF CARE:   SNF   CHIEF COMPLAINT:  Admission to SNF, post stay at [facility], 11/15/2013 through 11/20/2013.    HISTORY OF PRESENT ILLNESS:  This is a gentleman who is 80 years old.  He lives on his own in [place].    He was admitted with shortness of breath and chest tightness.   He was felt to have congestive heart failure.  A 2D-echocardiogram showed an EF of 45-50%.  Asymmetric septal hypertrophy was noted.  Systolic function was mildly reduced.  There was diffuse hypokinesis, atrial fibrillation.  Right ventricular systolic function was normal.  Pulmonary artery pressure was 29.    In hospital, he was diuresed with IV Lasix.  He diuresed 9.8 L.  His chest x-ray was felt to show edema.  2D-echo as noted above.    He had an elevated troponin.  He was felt to have demand ischemia.    He remained in atrial fibrillation and was continued on Xarelto for anticoagulation.      He had some nonsustained V-tach on Telemetry.    His potassium was 4.1.    PAST MEDICAL HISTORY/PROBLEM LIST:  Includes:    History of chronic atrial flutter, which I think underwent an ablation in the past.    He has remained in chronic atrial fibrillation.    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    Previous cardiac cath showing normal coronary arteries some years ago.    Diastolic congestive heart failure, acute-on-chronic.    Chronic interstitial lung disease.  Felt to be usual interstitial pneumonitis.  I note that he has had at least two different pulmonary function tests.    Wide-complex tachycardia.     Listed as having dementia.    CURRENT MEDICATIONS:  Discharge medications include:      ASA 81 q.d.     Coral calcium 1000 mg, 2 capsules daily.    Digoxin 0.125 q.d.    Cardizem CD 300 daily.    Lasix 40  b.i.d.     Neurontin 2 capsules at bedtime, 300 mg.    Mucinex 600 b.i.d.    Vicodin 5/500, 1/2 to 1 tablet every 6 hours p.r.n.    Arava 20 mg daily.     Losartan 25 daily.    Melatonin 10 mg at bedtime.    Omega-3, 1000, 2 tablets daily.    Omeprazole 20 b.i.d.    MiraLAX 17 g daily.    KCl 20 mEq daily.    Prednisone 5 mg daily.    Xarelto 20 mg daily.    Vitamin C 1000 U daily.    SOCIAL HISTORY:   HOUSING:  The patient tells me that he lives on his own in [place].   FUNCTIONAL STATUS:  Still independent with ADLs and IADLs.    FAMILY HISTORY:   SIBLINGS:  Has some family in [place]; however, they are unwell (brother and his wife).   CHILDREN:  Three adopted children.  Two of them are out of state; one, he does not keep up with anymore.    REVIEW OF SYSTEMS:    GENERAL:  States he has cyanosis of his lips and wonders why that is true.   CHEST/RESPIRATORY:  No major complaints  of cough or shortness of breath.   CARDIAC:   No clear chest pain.   GI:  No nausea,  vomiting or abdominal pain.   EDEMA/VARICOSITIES:   Extremities:  States he has edema of his left greater than right leg.  States some of this was due to vein-stripping on the left.    PHYSICAL EXAMINATION:   VITAL SIGNS:   O2 SATURATIONS:  95% on room air.   He has oxygen, but he is not using it.   RESPIRATIONS:  18.    PULSE:  82.   GENERAL APPEARANCE:  The patient is not in any distress.   HEENT:  He has at times seemed to have central cyanosis.   CHEST/RESPIRATORY:  Bronchial breathing at both lower lobes.    CARDIOVASCULAR:  CARDIAC:  Heart sounds are regular.  JVP is elevated, sitting upright.   EDEMA/VARICOSITIES:  He has no coccyx edema.  There is pitting edema of both legs above the thigh.  He has chronic lower extremity venous stasis.   GASTROINTESTINAL:  ABDOMEN:   No masses.  No tenderness.   GENITOURINARY:  BLADDER:    Not distended.   PSYCHIATRIC:   MENTAL STATUS:   The patient  episodically seems somewhat depressed, although not terribly so.  He complains bitterly about his memory.  He is convinced that he has underlying Alzheimer's disease.  I think he would need more testing.   I am not certain about the correctness of that assumption.    ASSESSMENT/PLAN:    Congestive heart failure.  He was adequately diuresed.    History of chronic atrial fibrillation.  On Xarelto.  Previously had had atrial flutter with rapid response and a tachycardic-induced cardiomyopathy.  Ejection fraction listed above is actually better than then.  At that point, he had a cardiac cath that did not show coronary artery disease.    Interstitial lung disease.    On medications for ?rheumatoid arthritis, although I do not really see this anywhere in his problem list.    Sleep apnea.  On CPAP at 9 cm/H2o.    ?Dementia.  He takes Namenda 10 mg daily.  Most of the patients I have seen in my career who complain bitterly about their memory loss turn out to have something other than Alzheimer's disease,  although he seemed to struggle with memory-related questions, I wonder if there is significant depression.    Interstitial lung disease; UIP. Has had several PFT's followed by pulmonology  The patient seems stable.  I am not going to adjust his medications at present.  He will need his weights followed.  He had runs of nonsustained V-tach on Telemetry in the hospital and will need to be monitored here, as well.       ------------------------------------------------------------------- Transthoracic Echocardiography  Patient:    Nicholas, Moran MR #:       [id number] Study Date: 11/16/2013 Gender:     M Age:        82 Height:     172.7 cm Weight:     95.9 kg BSA:        2.18 m^2 Pt. Status: Room:       3E23C   SONOGRAPHER  [facility], RDCS  ADMITTING    Yaakov Guthrie  ATTENDING    Margie Ege  PERFORMING   [facility],  Inpatient  cc:  ------------------------------------------------------------------- LV EF: 45% -  50%  ------------------------------------------------------------------- Indications:      CHF - 428.0.  ------------------------------------------------------------------- History:   PMH:   Atrial fibrillation.  Atrial flutter.  PMH: Myocardial infarction.  Risk factors:  Hypertension. Dyslipidemia.   ------------------------------------------------------------------- Study Conclusions  - Left ventricle: The cavity size was normal. Asymmetric septal   hypertrophy noted. Systolic function was mildly reduced. The   estimated ejection fraction was in the range of 45% to 50%.   Diffuse hypokinesis. Indeterminant diastolic function (atrial   fibrillation). Septal thickness, ED (2D): 22 mm. Posterior wall   thickness, ED (2D): 14.6 mm. - Aortic valve: There was no stenosis. - Mitral valve: Mildly calcified annulus. Mildly calcified leaflets   . There was trivial regurgitation. - Left atrium: The atrium was mildly dilated. - Right ventricle: The cavity size was normal. Systolic function   was mildly reduced. - Right atrium: The atrium was mildly dilated. - Tricuspid valve: Peak RV-RA gradient (S): 26 mm Hg. - Pulmonary arteries: PA peak pressure: 29 mm Hg (S). - Inferior vena cava: The vessel was normal in size. The   respirophasic diameter changes were in the normal range (= 50%),   consistent with normal central venous pressure.  Impressions:  - Normal LV size with asymmetric septal hypertrophy noted (no   mitral valve SAM or LVOT gradient). EF 45-50% with diffuse   hypokinesis. Normal RV size and systolic function. No significant   valvular abnormalities.  Transthoracic echocardiography.  M-mode, complete 2D, spectral Doppler, and color Doppler.  Birthdate:  Patient birthdate: 26-Jun-1931.  Age:  Patient is 80 yr old.  Sex:  Gender: male. BMI: 32.2 kg/m^2.  Blood pressure:      126/75  Patient status: Inpatient.  Study date:  Study date: 11/16/2013. Study time: 09:54 AM.  Location:  Echo laboratory.  -------------------------------------------------------------------  ------------------------------------------------------------------- Left ventricle:  The cavity size was normal. Asymmetric septal hypertrophy noted. Systolic function was mildly reduced. The estimated ejection fraction was in the range of 45% to 50%. Diffuse hypokinesis. Indeterminant diastolic function (atrial fibrillation).  ------------------------------------------------------------------- Aortic valve:   Trileaflet; mildly calcified leaflets.  Doppler: There was no stenosis.   There was no regurgitation.  ------------------------------------------------------------------- Aorta:  Aortic root: The aortic root was normal in size. Ascending aorta: The ascending aorta was normal in size.  ------------------------------------------------------------------- Mitral valve:   Mildly calcified annulus. Mildly calcified leaflets .  Doppler:   There was no evidence for stenosis.   There was trivial regurgitation.    Peak gradient (D): 3 mm Hg.  ------------------------------------------------------------------- Left atrium:  The atrium was mildly dilated.  ------------------------------------------------------------------- Right ventricle:  The cavity size was normal. Systolic function was mildly reduced.  ------------------------------------------------------------------- Pulmonic valve:    Structurally normal valve.   Cusp separation was normal.  Doppler:  Transvalvular velocity was within the normal range. There was no regurgitation.  ------------------------------------------------------------------- Tricuspid valve:   Doppler:  There was trivial regurgitation.   ------------------------------------------------------------------- Right atrium:  The atrium was mildly  dilated.  ------------------------------------------------------------------- Pericardium:  There was no pericardial effusion.  ------------------------------------------------------------------- Systemic veins: Inferior vena cava: The vessel was normal in size. The respirophasic diameter changes were in the normal range (= 50%), consistent with normal central venous pressure.

## 2013-12-01 ENCOUNTER — Ambulatory Visit: Payer: Self-pay | Admitting: Physician Assistant

## 2013-12-01 ENCOUNTER — Encounter: Payer: Self-pay | Admitting: Nurse Practitioner

## 2013-12-01 ENCOUNTER — Ambulatory Visit (INDEPENDENT_AMBULATORY_CARE_PROVIDER_SITE_OTHER): Payer: Medicare Other | Admitting: Nurse Practitioner

## 2013-12-01 VITALS — BP 120/68 | HR 98 | Ht 66.0 in

## 2013-12-01 DIAGNOSIS — I4892 Unspecified atrial flutter: Secondary | ICD-10-CM

## 2013-12-01 DIAGNOSIS — I5032 Chronic diastolic (congestive) heart failure: Secondary | ICD-10-CM

## 2013-12-01 DIAGNOSIS — I4819 Other persistent atrial fibrillation: Secondary | ICD-10-CM

## 2013-12-01 DIAGNOSIS — I481 Persistent atrial fibrillation: Secondary | ICD-10-CM

## 2013-12-01 DIAGNOSIS — I10 Essential (primary) hypertension: Secondary | ICD-10-CM

## 2013-12-01 MED ORDER — RIVAROXABAN 20 MG PO TABS: 20.0000 mg | ORAL_TABLET | Freq: Every day | ORAL | Status: DC

## 2013-12-01 MED ORDER — DILTIAZEM HCL ER COATED BEADS 300 MG PO CP24: 300.0000 mg | ORAL_CAPSULE | Freq: Every day | ORAL | Status: DC

## 2013-12-01 MED ORDER — DIGOXIN 125 MCG PO TABS: 0.1250 mg | ORAL_TABLET | Freq: Every day | ORAL | Status: DC

## 2013-12-01 NOTE — Patient Instructions (Addendum)
Your physician has recommended you make the following change in your medication:   START TAKING XERALTO  20MG   DECREASE DIGOXIN  .125MG  ONCE A DAY   INCREASE DILTIAZEM 300MG  ONCE A DAY  STOP TAKING AMIODARONE  STOP TAKING METOPROLOL  STOP TAKING ASPIRIN   STOP TAKING PRADAXA  Your physician recommends that you return for lab work in:      Your physician recommends that you schedule a follow-up appointment in:  1 MONTH WITH DR    ENCOURAGED TO  WEIGH WEEKLY

## 2013-12-01 NOTE — Progress Notes (Signed)
Patient Name: [name] Date of Encounter: 12/01/2013  Primary Care Provider:  [name], MD Primary Cardiologist:  P. [place], MD   Patient Profile  80 year old male who presents today following recent hospitalization for diastolic heart failure.  Problem List   Past Medical History  Diagnosis Date  . Atrial flutter     a. s/p ablation for RVR  03/2009, Xarelto ongoing  . VENOUS INSUFFICIENCY, LEFT LEG   . PULMONARY FIBROSIS, INTERSTITIAL   . OBSTRUCTIVE SLEEP APNEA   . BENIGN POSITIONAL VERTIGO   . HYPERGLYCEMIA 2011  . ALLERGIC RHINITIS   . Rheumatoid arthritis(714.0)   . Chronic diastolic CHF (congestive heart failure)     a. 10/2013 Echo: EF 45-50%, mild TR, mildly dil LA/RA.  [facility] PERIPHERAL NEUROPATHY   . HYPERTENSION   . HYPERLIPIDEMIA   . DEPRESSION     [facility] 01/2011 for SI  . Anxiety   . Morbid obesity   . Diverticulosis of colon (without mention of hemorrhage)   . Atrial fibrillation     a. chronic xarelto;  b. prev on amio->d/c'd 2/2 pulmonary fibrosis.   Past Surgical History  Procedure Laterality Date  . 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 Shoulder surgery Right 09/2009  . Varicose vein surgery      Left Leg  . Radiofrequency ablation    . Colonoscopy w/ biopsies      Allergies  Allergies  Allergen Reactions  . Methotrexate Swelling    REACTION: ONLY HAS ONE KIDNEY, caused swelling in hands and feet    HPI  80 year old male with prior history of diastolic congestive heart failure and persistent atrial fibrillation. He was recently admitted to [facility] secondary to chest pain and dyspnea. He ruled out for MI. He was diuresed and was -9.8 L during hospital position. His weight came down to 202 pounds. Echocardiogram showed an EF of 45-50%. He remained in atrial fibrillation throughout hospitalization and required titration of his diltiazem to 300 mg daily. He was maintained on  xarelto throughout hospitalization and was subsequently discharged to skilled nursing facility secondary to history of noncompliance and inability to care for himself at home. Despite his discharge summary clearly spilling out which medications we wanted him to be on, at the skilled nursing facility, for some reason, he was placed back on amiodarone which was previously discontinued in the setting of pulmonary fibrosis and ongoing atrial fibrillation. He was placed on pradaxa instead of xarelto.  His previous diltiazem dose of 180 mg daily was resumed instead of 300 mg daily which was prescribed at discharge. He was not continued on losartan therapy, which was also prescribed at discharge. Unfortunately the patient has no understanding of what medications he should be on. He has not been having any chest pain or dyspnea. He says that his weight is stable though he is not sure. He has not been having any significant lower extremity edema. He says that he has been coughing some with dark mucus. He cannot be sure if it is blood tinged or not. He has not been having any frank hemoptysis. He is easily agitated today and upset with the skilled nursing facility for changing around his medicines. He denies PND, orthopnea, dizziness, syncope, or early satiety.  Home Medications  Prior to Admission medications   Medication Sig Start Date End Date Taking? Authorizing Provider  Cholecalciferol (VITAMIN D3) 1000 UNITS CAPS Take  1 capsule by mouth.    Yes Historical Provider, MD  Coral Calcium 1000 (390 CA) MG TABS Take 2 capsules by mouth.     Yes Historical Provider, MD  digoxin (LANOXIN) 0.125 MG tablet Take 1 tablet (0.125 mg total) by mouth daily. 12/01/13  Yes Dwana Melena, PA-C  diltiazem (TIAZAC) 300 MG 24 hr capsule Take 300 mg by mouth daily.   Yes Historical Provider, MD  FOLIC ACID PO Take 1,000 mg by mouth daily.   Yes Historical Provider, MD  furosemide (LASIX) 40 MG tablet Take 40 mg by mouth 2 (two)  times daily.  11/10/13  Yes Historical Provider, MD  gabapentin (NEURONTIN) 600 MG tablet Take 600 mg by mouth daily.   Yes Historical Provider, MD  Glucosamine HCl (GLUCOSAMINE PO) Take 1,200 mg by mouth daily.   Yes Historical Provider, MD  guaiFENesin (MUCINEX) 600 MG 12 hr tablet Take 600 mg by mouth 2 (two) times daily.   Yes Historical Provider, MD  HYDROcodone-acetaminophen (NORCO/VICODIN) 5-325 MG per tablet Take 1 tablet by mouth every 6 (six) hours. 10/12/13  Yes Historical Provider, MD  leflunomide (ARAVA) 10 MG tablet Take 20 mg by mouth daily.  02/16/11  Yes Newt Lukes, MD  leflunomide (ARAVA) 20 MG tablet Take 20 mg by mouth daily. 10/10/13  Yes Historical Provider, MD  losartan (COZAAR) 25 MG tablet Take 1 tablet (25 mg total) by mouth daily. 11/20/13  Yes Rhonda G Barrett, PA-C  Melatonin 5 MG CAPS Take 1 capsule by mouth at bedtime.   Yes Historical Provider, MD  memantine (NAMENDA) 10 MG tablet Take 1 tablet (10 mg total) by mouth daily. 11/14/13  Yes Newt Lukes, MD  Multiple Vitamin (MULTIVITAMIN) tablet Take 1 tablet by mouth daily.     Yes Historical Provider, MD  NON FORMULARY at bedtime. c pap   Yes Historical Provider, MD  OMEGA-3 1000 MG CAPS Take 1,000 mg by mouth daily.    Yes Historical Provider, MD  omeprazole (PRILOSEC) 20 MG capsule Take 1 capsule (20 mg total) by mouth 2 (two) times daily. 07/07/13  Yes Newt Lukes, MD  polyethylene glycol (MIRALAX / GLYCOLAX) packet Take 17 g by mouth daily. Hold for diarrhea or loose stools. 11/20/13  Yes Rhonda G Barrett, PA-C  potassium chloride SA (K-DUR,KLOR-CON) 20 MEQ tablet Take 1 tablet (20 mEq total) by mouth daily. 11/20/13  Yes Rhonda G Barrett, PA-C  predniSONE (DELTASONE) 10 MG tablet Take 10 mg by mouth daily with breakfast.   Yes Historical Provider, MD  vitamin B-12 (CYANOCOBALAMIN) 1000 MCG tablet Take 1,000 mcg by mouth daily.   Yes Historical Provider, MD  vitamin E 400 UNIT capsule Take 400 Units  by mouth daily.   Yes Historical Provider, MD  diltiazem (CARDIZEM CD) 300 MG 24 hr capsule Take 1 capsule (300 mg total) by mouth daily. 12/01/13   Dwana Melena, PA-C  rivaroxaban (XARELTO) 20 MG TABS tablet Take 1 tablet (20 mg total) by mouth daily with supper. 12/01/13   Dwana Melena, PA-C    Review of Systems  He denies PND, orthopnea, dizziness, syncope, or early satiety. He has some degree of left greater than right lower extremity swelling. He has not been having chest pain or dyspnea.  He feels that the skilled nursing facility is not doing a good job of taking care of him. He has had an intermittent productive cough with dark mucus. All other systems reviewed and are otherwise negative  except as noted above.  Physical Exam  Blood pressure 120/68, pulse 98, height 5\' 6"  (1.676 m), weight 0 lb (0 kg).  General: Pleasant, NAD Psych: Normal affect though easily agitated. He raises his voice at times. Neuro: Alert and oriented X 3. Moves all extremities spontaneously. HEENT: Normal  Neck: Supple without bruits or JVD. Lungs:  Resp regular and unlabored, bibasilar crackles. Heart: irregular no s3, s4, soft systolic murmur throughout. Abdomen: Soft, obese, non-tender, non-distended, BS + x 4.  Extremities: No clubbing, cyanosis, trace right lower extremity edema, 1+ left lower extremity edema. Skin is dry and cracking on his legs.DP/PT/Radials 2+ and equal bilaterally.  Accessory Clinical Findings  ECG - atrial fibrillation, 98, EDC, no acute ST or T changes.  Assessment & Plan  1.  Chronic diastolic congestive heart failure: Patient appears to be euvolemic on exam and has not had any recent dyspnea, PND, or orthopnea. He does have mild lower extremity edema which she says is chronic and improved overall. He is not sure if she is being weighed daily though the nursing home staff with him today says he believes that they are weighing him daily. His blood pressure stable however his  heart rate is elevated. Review of discharge summary shows that he is not currently on the medications as was prescribed at discharge. I have therefore increased his diltiazem from 180 mg to 300 mg daily. Have also reduced his digoxin from 0.25 to 0.125 mg daily.he is also supposed be on Lasix 40 mg twice a day. Review of his medicine list from the skilled nursing facility shows that he has not been receiving any Lasix. I will resume Lasix and losartan and plan to follow-up basic metabolic panel in one week. I've stressed in my note to the nursing home that he be weighed daily and that the proper medications be provided.  2. Atrial fibrillation: This is persistent. He was previously anticoagulated with xarelto however for an unclear reason, the nursing home switched to pradaxa. He had been on pradaxa in the past but this was apparently discontinued because he did not tolerate it.  I have asked that he be switched back to xarelto.  He has had a mild productive cough and is not sure if it has been blood tinged.  I've asked that he show this to staff @ the SNF and if he truly is coughing up blood, then he will need a cxr, pulm eval, and may need to come off of anticoagulation.  Of note, for some reason, he was also started back on amiodarone though this was not listed on the discharge summary. He had previously been on amiodarone but this was discontinued in the setting of its inability to keep him in sinus rhythm along with interstitial lung disease and pulmonary fibrosis. I have written to discontinue amiodarone once again. I have titrated his diltiazem to 300 mg daily as outlined above. I have adjusted his digoxin dose to 0.125 mg daily. He had been on a low-dose of metoprolol at the skilled nursing facility and I have discontinued this as this was not in our plan at discharge.  3. Hypertension: Stable.  4.  Disposition: Multiple medication changes as outlined above. I have communicated this in written form to  the skilled nursing facility. He will require a follow-up basic metabolic panel next week which can be performed at the skilled nursing facility.  He can follow up with Dr. in 4-6 weeks.   [place], NP 12/01/2013, 12:37  PM   

## 2013-12-02 ENCOUNTER — Telehealth: Payer: Self-pay | Admitting: Physician Assistant

## 2013-12-02 NOTE — Telephone Encounter (Signed)
RN from [facility] called. She could not read notes from yesterday. I reviewed notes and medication changes with the RN. I have also faxed the office note to their facility. The patient was already taking the medications as they were to be taken (ie no changes needed to be made) except the ASA. I advised the RN to DC the ASA and to weigh the patient daily. Signed,  Tereso Newcomer, PA-C   12/02/2013 11:20 AM

## 2013-12-04 ENCOUNTER — Telehealth: Payer: Self-pay | Admitting: Cardiology

## 2013-12-04 NOTE — Telephone Encounter (Signed)
New problem   [name] stated they sent over wrong medications list on Friday when pt saw [name]. Pt is a pt of [name] and [name] is faxing over correct medication list to [phone]. They want [name] to know about this mistake. Please call [name] of Nursing if you have any questions.

## 2013-12-09 ENCOUNTER — Non-Acute Institutional Stay (SKILLED_NURSING_FACILITY): Payer: Medicare Other | Admitting: Internal Medicine

## 2013-12-09 DIAGNOSIS — I482 Chronic atrial fibrillation, unspecified: Secondary | ICD-10-CM

## 2013-12-09 DIAGNOSIS — I5023 Acute on chronic systolic (congestive) heart failure: Secondary | ICD-10-CM

## 2013-12-09 DIAGNOSIS — J8410 Pulmonary fibrosis, unspecified: Secondary | ICD-10-CM

## 2013-12-11 NOTE — Progress Notes (Signed)
Patient ID: [name], male   DOB: 07/03/1931, 80 y.o.   MRN: [id number]               PROGRESS NOTE  DATE:  12/09/2013    FACILITY: [facility]    LEVEL OF CARE:   SNF   Acute Visit/Discharge Visit      CHIEF COMPLAINT:  Acute visit, pre-discharge review.    HISTORY OF PRESENT ILLNESS:  This is a gentleman who came to [place] after being in the hospital with congestive heart failure.  His echocardiogram showed an EF of 45-50% with diffuse hypokinesis, atrial fibrillation.  He was diuresed 9.8 L in the hospital.  He has atrial fibrillation with a controlled rate on Xarelto for anticoagulation.    The patient also has significant chronic lung issues including interstitial lung disease and, according to the patient, he has COPD although I do not see this listed on his problem list.    Furthermore, he is listed as having dementia, on a once a day dose of Namenda.  First of all, I do not think he has dementia although I would need to follow him longitudinally to make certain of this.  Why he is on once a day Namenda, I am unclear.    The patient was seen by Cardiology during this stay here.  I note that there was confusion with his medications.  Apparently, the facility had sent the wrong MAR, presumably on the wrong patient.  His enteric-coated aspirin has been discontinued.  He is on Xarelto, Cardizem CD at 300, and Lasix 40 b.i.d.  He is not on amiodarone.    LABORATORY DATA:  Lab work from 11/28/2013:  His white count was 11.7, hemoglobin 13.3.    His basic metabolic panel is essentially normal from 12/07/2013:  Sodium was 139, potassium of 4.5.  His BUN and creatinine are within normal range.    TSH is 2.070.    His Digoxin level was 0.8, although I will not adjust this.    Serum magnesium was 2.    REVIEW OF SYSTEMS:   GENERAL:  His weight has gone from 204 pounds on 11/21/2013 to 214 pounds on 12/07/2013.   CHEST/RESPIRATORY:  Shortness of breath on exertion.   CARDIAC:    He is not complaining of chest pain.   GU:  He is barely complaining of the amount of urine that he voids.  I note that he saw Urology.  It was recommended that we continue a condom cath.  It was not recommended that he receive either Flomax or Myrbetriq.    PHYSICAL EXAMINATION:   VITAL SIGNS:   O2 SATURATIONS:  95% on room air.   PULSE:  76.   RESPIRATIONS:  20.   GENERAL APPEARANCE:  The patient is not in any distress.   CHEST/RESPIRATORY:  Shallow, but otherwise clear air entry bilaterally.   CARDIOVASCULAR:  CARDIAC:  Heart sounds are irregular.  JVP is not elevated.   EDEMA/VARICOSITIES:  He has no sacral edema.  He has left greater than right lower extremity edema, although I think most of this is venous stasis.    GASTROINTESTINAL:  ABDOMEN:   Distended without tenderness.   GENITOURINARY:  BLADDER:   His condom catheter remains intact.   PSYCHIATRIC:   MENTAL STATUS:   The patient is orientated to the year, month.  He can name the President, Vice-President.  He almost remembers the day he came in here.  He remembers that I told  him I was from [place] in spite of the fact I have not seen him in two weeks.  I think there are elements of depression here.  I do not believe that this man has Alzheimer's disease or dementia, although very mild dementia would be difficult to exclude in one sitting.    ASSESSMENT/PLAN:    Systolic congestive heart failure.  This seems stable.  His weight has gone up, although I would like him to be re-weighed before I increase his Lasix, currently at 40 b.i.d.    Atrial fibrillation.  Rate is controlled.  He is on Xarelto.    Interstitial lung disease.  The patient states he has COPD and that is the reason he is on chronic guaifenesin.  He certainly has chronic interstitial lung disease and I think this is the reason for the prednisone, perhaps rheumatoid arthritis.    Sleep apnea.  On CPAP.  He appears to be compliant with this.    There have been  multiple concerns raised by the providers about this man's capacity to look after himself although, when I brought this topic up with the patient, he says he would like to go home and try.  He apparently lives in an apartment complex in [place].  His exact functional status is unclear.  I will ask the social worker in the facility to clarify this before he leaves on Monday.    He will need PT, OT, and a skilled nurse.      CPT CODE: 69678 (well over 30 minutes spent on this)

## 2013-12-13 NOTE — Telephone Encounter (Signed)
Spoke to [employer] of nursing we never received patient's correct medication list.Stated she will re fax patient's correct medication list.She stated patient has been discharged from [facility] to his home.

## 2013-12-13 NOTE — Telephone Encounter (Signed)
Spoke to nursing supervisor Beatrix Shipper 12/08/13 she will fax over patient's correct medication list.

## 2013-12-18 ENCOUNTER — Ambulatory Visit: Payer: Self-pay | Admitting: Internal Medicine

## 2013-12-19 ENCOUNTER — Ambulatory Visit: Payer: Self-pay | Admitting: Family

## 2013-12-20 ENCOUNTER — Ambulatory Visit: Payer: Self-pay | Admitting: Family

## 2013-12-21 NOTE — Telephone Encounter (Signed)
Correct med list given to Ward Givens PA.

## 2013-12-22 DIAGNOSIS — I259 Chronic ischemic heart disease, unspecified: Secondary | ICD-10-CM

## 2013-12-22 DIAGNOSIS — G629 Polyneuropathy, unspecified: Secondary | ICD-10-CM

## 2013-12-22 DIAGNOSIS — I5033 Acute on chronic diastolic (congestive) heart failure: Secondary | ICD-10-CM

## 2013-12-22 DIAGNOSIS — M069 Rheumatoid arthritis, unspecified: Secondary | ICD-10-CM

## 2013-12-26 ENCOUNTER — Ambulatory Visit (INDEPENDENT_AMBULATORY_CARE_PROVIDER_SITE_OTHER): Payer: Medicare Other | Admitting: Family

## 2013-12-26 ENCOUNTER — Encounter: Payer: Self-pay | Admitting: Family

## 2013-12-26 VITALS — BP 152/90 | HR 71 | Temp 98.2°F | Resp 18 | Ht 68.0 in | Wt 213.0 lb

## 2013-12-26 DIAGNOSIS — I482 Chronic atrial fibrillation, unspecified: Secondary | ICD-10-CM

## 2013-12-26 DIAGNOSIS — J84112 Idiopathic pulmonary fibrosis: Secondary | ICD-10-CM

## 2013-12-26 DIAGNOSIS — I10 Essential (primary) hypertension: Secondary | ICD-10-CM

## 2013-12-26 DIAGNOSIS — R32 Unspecified urinary incontinence: Secondary | ICD-10-CM | POA: Insufficient documentation

## 2013-12-26 DIAGNOSIS — N3941 Urge incontinence: Secondary | ICD-10-CM

## 2013-12-26 NOTE — Assessment & Plan Note (Signed)
Blood pressure remains labile. Continue diltiazem and losartan at current dosages. If increasing blood pressures noted potentially add additional agents as needed.

## 2013-12-26 NOTE — Assessment & Plan Note (Signed)
Patient describes what sounds as urge incontinence. He failed a trial on mybetriq. Home health nursing is recommending a condom catheter and pull up pants to assist him. Prescription written. Follow up if symptoms worsen or fail to improve.

## 2013-12-26 NOTE — Progress Notes (Signed)
Pre visit review using our clinic review tool, if applicable. No additional management support is needed unless otherwise documented below in the visit note. 

## 2013-12-26 NOTE — Assessment & Plan Note (Signed)
Stable at this time. Continue Xarelto for anticoagulation.

## 2013-12-26 NOTE — Patient Instructions (Signed)
Thank you for choosing [facility].  Summary/Instructions:  Continue to take your medications as prescribed.   Follow up with Dr. Felicity Coyer as needed.

## 2013-12-26 NOTE — Assessment & Plan Note (Signed)
Stable and symptoms have improved since previous visit. Patient indicates he is able to breathe better and cough up more productive secretions with the assistance of sitting up and night using his new bed. Continue current regimen. Follow up as needed

## 2013-12-26 NOTE — Progress Notes (Signed)
Subjective:    Patient ID: [name], male    DOB: 06/21/1931, 80 y.o.   MRN: [id number]  Chief Complaint  Patient presents with  . Follow-up    was told to try to get an order for a condom catheter and pull ups    HPI:  [name] is a 80 y.o. male who presents today for a follow up.   1) Hypertension -  Currently maintained on diltiazem and losartan. Denies any chest pain/discomfort, shortness of breath, edema, or heart palpitations.  BP Readings from Last 3 Encounters:  12/26/13 152/90  12/01/13 120/68  11/20/13 173/94    2) IPF - Lungs are feeling improved. Has been able to be sleeping seated up with new bed and indicates he is able to cough things up with the assistance of the new bed. Has not need to use the CPAP nearly as much.  3) Chronic atrial fibrillation - is anticoagulated with Xarelto and has not noticed as much bleeding.   4) Urinary incontinence - has been progressively coming on for several years now. Describes having difficulty getting to the bathroom fast enough and states he has been having urgency and frequency. This is not helped but the Lasix that he takes for his heart and blood pressure. He had a trial of mybetriq and ended up having some memory problems therefore it needed to be stopped. Nursing is recommended condom catheters and pull ups.    Allergies  Allergen Reactions  . Methotrexate Swelling    REACTION: ONLY HAS ONE KIDNEY, caused swelling in hands and feet   Current Outpatient Prescriptions on File Prior to Visit  Medication Sig Dispense Refill  . Cholecalciferol (VITAMIN D3) 1000 UNITS CAPS Take 1 capsule by mouth.     . Coral Calcium 1000 (390 CA) MG TABS Take 2 capsules by mouth.      . digoxin (LANOXIN) 0.125 MG tablet Take 1 tablet (0.125 mg total) by mouth daily. 30 tablet 6  . diltiazem (CARDIZEM CD) 300 MG 24 hr capsule Take 1 capsule (300 mg total) by mouth daily. 60 capsule 6  . diltiazem (TIAZAC) 300 MG 24 hr capsule  Take 300 mg by mouth daily.    Marland Kitchen FOLIC ACID PO Take 1,000 mg by mouth daily.    . furosemide (LASIX) 40 MG tablet Take 40 mg by mouth 2 (two) times daily.     Marland Kitchen gabapentin (NEURONTIN) 600 MG tablet Take 600 mg by mouth daily.    . Glucosamine HCl (GLUCOSAMINE PO) Take 1,200 mg by mouth daily.    Marland Kitchen guaiFENesin (MUCINEX) 600 MG 12 hr tablet Take 600 mg by mouth 2 (two) times daily.    Marland Kitchen HYDROcodone-acetaminophen (NORCO/VICODIN) 5-325 MG per tablet Take 1 tablet by mouth every 6 (six) hours.    Marland Kitchen leflunomide (ARAVA) 10 MG tablet Take 20 mg by mouth daily.     Marland Kitchen leflunomide (ARAVA) 20 MG tablet Take 20 mg by mouth daily.    Marland Kitchen losartan (COZAAR) 25 MG tablet Take 1 tablet (25 mg total) by mouth daily. 30 tablet 11  . Melatonin 5 MG CAPS Take 1 capsule by mouth at bedtime.    . memantine (NAMENDA) 10 MG tablet Take 1 tablet (10 mg total) by mouth daily. 30 tablet 5  . Multiple Vitamin (MULTIVITAMIN) tablet Take 1 tablet by mouth daily.      . NON FORMULARY at bedtime. c pap    . OMEGA-3 1000 MG CAPS Take 1,000  mg by mouth daily.     Marland Kitchen omeprazole (PRILOSEC) 20 MG capsule Take 1 capsule (20 mg total) by mouth 2 (two) times daily. 60 capsule 5  . polyethylene glycol (MIRALAX / GLYCOLAX) packet Take 17 g by mouth daily. Hold for diarrhea or loose stools. 30 each 6  . potassium chloride SA (K-DUR,KLOR-CON) 20 MEQ tablet Take 1 tablet (20 mEq total) by mouth daily. 30 tablet 11  . predniSONE (DELTASONE) 10 MG tablet Take 10 mg by mouth daily with breakfast.    . rivaroxaban (XARELTO) 20 MG TABS tablet Take 1 tablet (20 mg total) by mouth daily with supper. 30 tablet 6  . vitamin B-12 (CYANOCOBALAMIN) 1000 MCG tablet Take 1,000 mcg by mouth daily.    . vitamin E 400 UNIT capsule Take 400 Units by mouth daily.     No current facility-administered medications on file prior to visit.   Past Medical History  Diagnosis Date  . Atrial flutter     a. s/p ablation for RVR  03/2009, Xarelto ongoing  . VENOUS  INSUFFICIENCY, LEFT LEG   . PULMONARY FIBROSIS, INTERSTITIAL   . OBSTRUCTIVE SLEEP APNEA   . BENIGN POSITIONAL VERTIGO   . HYPERGLYCEMIA 2011  . ALLERGIC RHINITIS   . Rheumatoid arthritis(714.0)   . Chronic diastolic CHF (congestive heart failure)     a. 10/2013 Echo: EF 45-50%, mild TR, mildly dil LA/RA.  Marland Kitchen PERIPHERAL NEUROPATHY   . HYPERTENSION   . HYPERLIPIDEMIA   . DEPRESSION     [facility] hosp 01/2011 for SI  . Anxiety   . Morbid obesity   . Diverticulosis of colon (without mention of hemorrhage)   . Atrial fibrillation     a. chronic xarelto;  b. prev on amio->d/c'd 2/2 pulmonary fibrosis.    Review of Systems    See HPI Objective:    BP 152/90 mmHg  Pulse 71  Temp(Src) 98.2 F (36.8 C) (Oral)  Resp 18  Ht 5\' 8"  (1.727 m)  Wt 213 lb (96.616 kg)  BMI 32.39 kg/m2  SpO2 93% Nursing note and vital signs reviewed.  Physical Exam  Constitutional: He is oriented to person, place, and time. He appears well-developed and well-nourished. No distress.  Male seated in a chair with walker next in. Appears his stated age; appears dressed appropriately for the situation.  Cardiovascular: Normal rate, regular rhythm, normal heart sounds and intact distal pulses.   Pulmonary/Chest: Effort normal and breath sounds normal.  Neurological: He is alert and oriented to person, place, and time.  Skin: Skin is warm and dry.  Psychiatric: He has a normal mood and affect. His behavior is normal. Judgment and thought content normal.       Assessment & Plan:

## 2013-12-28 ENCOUNTER — Telehealth: Payer: Self-pay | Admitting: Internal Medicine

## 2013-12-28 NOTE — Telephone Encounter (Signed)
emmi emailed °

## 2014-01-22 ENCOUNTER — Telehealth: Payer: Self-pay | Admitting: Internal Medicine

## 2014-01-22 DIAGNOSIS — M069 Rheumatoid arthritis, unspecified: Secondary | ICD-10-CM

## 2014-01-22 DIAGNOSIS — I482 Chronic atrial fibrillation, unspecified: Secondary | ICD-10-CM

## 2014-01-22 DIAGNOSIS — J84112 Idiopathic pulmonary fibrosis: Secondary | ICD-10-CM

## 2014-01-22 NOTE — Telephone Encounter (Signed)
Received call from insurance company. States that patient needs to schedule an appt to have orders for home health. This is a new [employer] technologist and she states that the member does not have his card yet. The info she gave me would not verify. She stated that she could not provide me with faxed info showing eligibility. Advised that if she cannot do that, then we will have to treat the patient as self pay because we have no other way to verify this information.

## 2014-01-29 ENCOUNTER — Encounter: Payer: Self-pay | Admitting: Family

## 2014-01-29 ENCOUNTER — Encounter: Payer: Self-pay | Admitting: Cardiology

## 2014-01-29 ENCOUNTER — Ambulatory Visit (INDEPENDENT_AMBULATORY_CARE_PROVIDER_SITE_OTHER): Payer: Medicare Other | Admitting: Cardiology

## 2014-01-29 ENCOUNTER — Ambulatory Visit (INDEPENDENT_AMBULATORY_CARE_PROVIDER_SITE_OTHER): Payer: Medicare Other | Admitting: Family

## 2014-01-29 VITALS — BP 132/71 | HR 75 | Ht 68.0 in | Wt 207.8 lb

## 2014-01-29 VITALS — BP 220/120 | HR 76 | Temp 98.5°F | Resp 18 | Ht 68.0 in | Wt 207.1 lb

## 2014-01-29 DIAGNOSIS — I5032 Chronic diastolic (congestive) heart failure: Secondary | ICD-10-CM

## 2014-01-29 DIAGNOSIS — Z7189 Other specified counseling: Secondary | ICD-10-CM | POA: Diagnosis not present

## 2014-01-29 DIAGNOSIS — I482 Chronic atrial fibrillation, unspecified: Secondary | ICD-10-CM

## 2014-01-29 DIAGNOSIS — I10 Essential (primary) hypertension: Secondary | ICD-10-CM

## 2014-01-29 DIAGNOSIS — N3941 Urge incontinence: Secondary | ICD-10-CM

## 2014-01-29 NOTE — Progress Notes (Signed)
Pre visit review using our clinic review tool, if applicable. No additional management support is needed unless otherwise documented below in the visit note. 

## 2014-01-29 NOTE — Patient Instructions (Addendum)
I would recommend you use a pill box to keep track of your medication  Continue to restrict you salt intake. Weigh yourself daily.   Continue your current medication.  We will check lab work today  I will see you in 3 months.

## 2014-01-29 NOTE — Assessment & Plan Note (Signed)
Prescription for condom catheters provided to patient.

## 2014-01-29 NOTE — Progress Notes (Signed)
Subjective:    Patient ID: [name], male    DOB: 1931-07-06, 81 y.o.   MRN: [id number]  Chief Complaint  Patient presents with  . Follow-up    Was told if he gets a rx today he can get on a program so he can get equipment needed for conditions, home health let him go due to driving    HPI:  [name] is a 81 y.o. male who presents today to discuss home health.  Indicates that his multiple medical conditions including chronic diastolic heart failure, pulmonary fibrosis, depression, and urinary incontinence have provided challenges to managing his health. He indicates that he was recently using a telemonitoring system which included oxygen saturation, blood pressure monitoring, and scale to measure weight was recently removed from him secondary to the no longer need for home care.  Patient indicates difficulty with using regular scales to monitor his weight for his heart failure including calibration, reading and remembering the numbers.  Indicates difficulty with blood pressure monitoring from home blood pressure cuff. Concerned he may miss something significant. States the telemonitoring system provides his some relief of his anxiety secondary to assisted monitoring. Furthermore he does not have the monetary resources to obtain the equipment.  Patient also indicates she has been working with [facility] to get condom catheters however has been unsuccessful to this point. States that his urinary incontinence has remained about the same and he is barely able to hold his urine. Condom catheters have previously worked in the past or recommended by nursing.  Allergies  Allergen Reactions  . Methotrexate Swelling    REACTION: ONLY HAS ONE KIDNEY, caused swelling in hands and feet    Current Outpatient Prescriptions on File Prior to Visit  Medication Sig Dispense Refill  . Cholecalciferol (VITAMIN D3) 1000 UNITS CAPS Take 1 capsule by mouth.     . Coral Calcium 1000 (390  CA) MG TABS Take 2 capsules by mouth.      . digoxin (LANOXIN) 0.125 MG tablet Take 1 tablet (0.125 mg total) by mouth daily. 30 tablet 6  . diltiazem (CARDIZEM CD) 300 MG 24 hr capsule Take 1 capsule (300 mg total) by mouth daily. 60 capsule 6  . diltiazem (TIAZAC) 300 MG 24 hr capsule Take 300 mg by mouth daily.    Marland Kitchen FOLIC ACID PO Take 1,000 mg by mouth daily.    . furosemide (LASIX) 40 MG tablet Take 40 mg by mouth 2 (two) times daily.     Marland Kitchen gabapentin (NEURONTIN) 600 MG tablet Take 600 mg by mouth daily.    Marland Kitchen guaiFENesin (MUCINEX) 600 MG 12 hr tablet Take 600 mg by mouth 2 (two) times daily. Take 1/2 tablet twice a day    . HYDROcodone-acetaminophen (NORCO/VICODIN) 5-325 MG per tablet Take 1 tablet by mouth every 6 (six) hours.    Marland Kitchen leflunomide (ARAVA) 10 MG tablet Take 20 mg by mouth daily.     Marland Kitchen leflunomide (ARAVA) 20 MG tablet Take 20 mg by mouth daily.    . Melatonin 5 MG CAPS Take 1 capsule by mouth at bedtime.    . memantine (NAMENDA) 10 MG tablet Take 1 tablet (10 mg total) by mouth daily. 30 tablet 5  . Multiple Vitamin (MULTIVITAMIN) tablet Take 1 tablet by mouth daily.      . NON FORMULARY at bedtime. c pap    . OMEGA-3 1000 MG CAPS Take 1,000 mg by mouth daily.     Marland Kitchen omeprazole (  PRILOSEC) 20 MG capsule Take 1 capsule (20 mg total) by mouth 2 (two) times daily. 60 capsule 5  . prednisoLONE 5 MG TABS tablet Take 5 mg by mouth 3 (three) times daily.    . rivaroxaban (XARELTO) 20 MG TABS tablet Take 1 tablet (20 mg total) by mouth daily with supper. 30 tablet 6  . vitamin B-12 (CYANOCOBALAMIN) 1000 MCG tablet Take 1,000 mcg by mouth daily.     No current facility-administered medications on file prior to visit.    Review of Systems  Respiratory: Negative for cough, chest tightness, shortness of breath and wheezing.   Cardiovascular: Negative for chest pain and palpitations.  Neurological: Negative for dizziness and headaches.      Objective:    BP 220/120 mmHg  Pulse 76   Temp(Src) 98.5 F (36.9 C) (Oral)  Resp 18  Ht 5\' 8"  (1.727 m)  Wt 207 lb 1.9 oz (93.949 kg)  BMI 31.50 kg/m2  SpO2 97% Nursing note and vital signs reviewed.  Retake blood pressure 150/78  Physical Exam  Constitutional: He is oriented to person, place, and time. He appears well-developed and well-nourished. No distress.  Cardiovascular: Normal rate, normal heart sounds and intact distal pulses.  An irregular rhythm present.  Pulmonary/Chest: Effort normal and breath sounds normal.  Neurological: He is alert and oriented to person, place, and time.  Skin: Skin is warm and dry.  Psychiatric: His speech is normal. Judgment and thought content normal. His mood appears anxious. Cognition and memory are impaired.  Behavior is frustrated       Assessment & Plan:

## 2014-01-29 NOTE — Assessment & Plan Note (Addendum)
Patient's multiple medical conditions make it hard for him to manage his day-to-day health care. Indicates difficulty with using standardized bathroom scales and home blood pressure cuffs. Given his history of depression and some forgetfulness as he describes, there is concern for the safety and adequacy of his ability to monitor his conditions completely on his own. Therefore, it is recommend for telemonitoring services of blood pressure monitoring, weight monitoring, and oxygen saturation to assist with his daily health care.Marland Kitchen

## 2014-01-29 NOTE — Progress Notes (Signed)
[name] Date of Birth: [birth year] Medical Record #[id number]  History of Present Illness: [name] is seen for  follow up CHF. He has chronic atrial fib. Has had prior atrial flutter ablation. Remains on chronic anticoagulation with Xarelto. He has a history of CHF related to tachycardia mediated CM and  diastolic heart failure. His other problems include venous insufficiency, interstitial pulmonary fibrosis, OSA, vertigo, RA, neuropathy, HTN, HLD, depression and anxiety.  He was admitted in November with exacerbation of CHF. This was largely related to dietary and medication noncompliance. He was DC to a nursing facility but is now back living at home. He know that salt is bad for him and he is trying to limit his intake. He still drives. He has dementia and knows his memory is poor. He doesn't really have a system for taking his meds. He walks with a walker. He does wear support hose. Weight is down 6 lbs. He doesn't weigh daily at home. He denies SOB or chest pain now.   Current Outpatient Prescriptions on File Prior to Visit  Medication Sig Dispense Refill  . Cholecalciferol (VITAMIN D3) 1000 UNITS CAPS Take 1 capsule by mouth.     . Coral Calcium 1000 (390 CA) MG TABS Take 2 capsules by mouth.      . digoxin (LANOXIN) 0.125 MG tablet Take 1 tablet (0.125 mg total) by mouth daily. 30 tablet 6  . diltiazem (CARDIZEM CD) 300 MG 24 hr capsule Take 1 capsule (300 mg total) by mouth daily. 60 capsule 6  . diltiazem (TIAZAC) 300 MG 24 hr capsule Take 300 mg by mouth daily.    Marland Kitchen FOLIC ACID PO Take 1,000 mg by mouth daily.    . furosemide (LASIX) 40 MG tablet Take 40 mg by mouth 2 (two) times daily.     Marland Kitchen gabapentin (NEURONTIN) 600 MG tablet Take 600 mg by mouth daily.    Marland Kitchen guaiFENesin (MUCINEX) 600 MG 12 hr tablet Take 600 mg by mouth 2 (two) times daily. Take 1/2 tablet twice a day    . HYDROcodone-acetaminophen (NORCO/VICODIN) 5-325 MG per tablet Take 1 tablet by mouth every 6 (six) hours.     Marland Kitchen leflunomide (ARAVA) 10 MG tablet Take 20 mg by mouth daily.     Marland Kitchen leflunomide (ARAVA) 20 MG tablet Take 20 mg by mouth daily.    Marland Kitchen losartan (COZAAR) 25 MG tablet Take 1 tablet (25 mg total) by mouth daily. 30 tablet 11  . Melatonin 5 MG CAPS Take 1 capsule by mouth at bedtime.    . memantine (NAMENDA) 10 MG tablet Take 1 tablet (10 mg total) by mouth daily. 30 tablet 5  . Multiple Vitamin (MULTIVITAMIN) tablet Take 1 tablet by mouth daily.      . NON FORMULARY at bedtime. c pap    . OMEGA-3 1000 MG CAPS Take 1,000 mg by mouth daily.     Marland Kitchen omeprazole (PRILOSEC) 20 MG capsule Take 1 capsule (20 mg total) by mouth 2 (two) times daily. 60 capsule 5  . rivaroxaban (XARELTO) 20 MG TABS tablet Take 1 tablet (20 mg total) by mouth daily with supper. 30 tablet 6  . vitamin B-12 (CYANOCOBALAMIN) 1000 MCG tablet Take 1,000 mcg by mouth daily.     No current facility-administered medications on file prior to visit.    Allergies  Allergen Reactions  . Methotrexate Swelling    REACTION: ONLY HAS ONE KIDNEY, caused swelling in hands and feet    Past Medical  History  Diagnosis Date  . Atrial flutter     a. s/p ablation for RVR  03/2009, Xarelto ongoing  . VENOUS INSUFFICIENCY, LEFT LEG   . PULMONARY FIBROSIS, INTERSTITIAL   . OBSTRUCTIVE SLEEP APNEA   . BENIGN POSITIONAL VERTIGO   . HYPERGLYCEMIA 2011  . ALLERGIC RHINITIS   . Rheumatoid arthritis(714.0)   . Chronic diastolic CHF (congestive heart failure)     a. 10/2013 Echo: EF 45-50%, mild TR, mildly dil LA/RA.  Marland Kitchen PERIPHERAL NEUROPATHY   . HYPERTENSION   . HYPERLIPIDEMIA   . DEPRESSION     BH hosp 01/2011 for SI  . Anxiety   . Morbid obesity   . Diverticulosis of colon (without mention of hemorrhage)   . Atrial fibrillation     a. chronic xarelto;  b. prev on amio->d/c'd 2/2 pulmonary fibrosis.    Past Surgical History  Procedure Laterality Date  . 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 Shoulder surgery Right 09/2009  . Varicose vein surgery      Left Leg  . Radiofrequency ablation    . Colonoscopy w/ biopsies      History  Smoking status  . Former Smoker -- 1.00 packs/day for 3 years  . Types: Cigarettes  . Quit date: 01/20/2000  Smokeless tobacco  . Never Used    Comment: Single, lives alone. 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 His family contact would be his sister [name] [phone]    History  Alcohol Use No    Comment: Former    Family History  Problem Relation Age of Onset  . Heart attack Father 54  . Other Mother 26    Old age  . Colon cancer Neg Hx     Review of Systems: The review of systems is per the HPI.  All other systems were reviewed and are negative.  Physical Exam: BP 132/71 mmHg  Pulse 75  Ht 5\' 8"  (1.727 m)  Wt 207 lb 12.8 oz (94.257 kg)  BMI 31.60 kg/m2 Patient is a pleasant and in no acute distress. He is obese. Skin is warm and dry. Color is normal.  HEENT is unremarkable. Normocephalic/atraumatic. PERRL. Sclera are nonicteric. Neck is supple. No masses. No JVD. Lungs reveals are clear. Cardiac exam shows an irregular rhythm. Rate is controlled. No gallop or murmur. Abdomen is obese but soft. Extremities show 2+ edema on the left and 1+ on the right. Gait and ROM are intact. No gross neurologic deficits noted.   LABORATORY DATA:  Lab Results  Component Value Date   WBC 11.8* 11/15/2013   HGB 13.6 11/15/2013   HCT 40.4 11/15/2013   PLT 255 11/15/2013   GLUCOSE 112* 11/19/2013   CHOL 172 08/16/2013   TRIG 77.0 08/16/2013   HDL 31.80* 08/16/2013   LDLCALC 125* 08/16/2013   ALT 17 06/20/2013   AST 16 06/20/2013   NA 138 11/19/2013   K 4.1 11/19/2013   CL 100 11/19/2013   CREATININE 0.90 11/19/2013   BUN 25* 11/19/2013   CO2 25 11/19/2013   TSH 3.71 06/20/2013   PSA 5.04* 09/26/2010   INR 1.14 09/19/2010   HGBA1C 6.2 06/20/2013   Echo: Study Conclusions  11/16/13: Study Conclusions  - Left ventricle: The cavity size was normal. Asymmetric septal hypertrophy noted. Systolic function was mildly reduced. The estimated ejection fraction was in the range of  45% to 50%. Diffuse hypokinesis. Indeterminant diastolic function (atrial fibrillation). Septal thickness, ED (2D): 22 mm. Posterior wall thickness, ED (2D): 14.6 mm. - Aortic valve: There was no stenosis. - Mitral valve: Mildly calcified annulus. Mildly calcified leaflets . There was trivial regurgitation. - Left atrium: The atrium was mildly dilated. - Right ventricle: The cavity size was normal. Systolic function was mildly reduced. - Right atrium: The atrium was mildly dilated. - Tricuspid valve: Peak RV-RA gradient (S): 26 mm Hg. - Pulmonary arteries: PA peak pressure: 29 mm Hg (S). - Inferior vena cava: The vessel was normal in size. The respirophasic diameter changes were in the normal range (= 50%), consistent with normal central venous pressure.  Impressions:  - Normal LV size with asymmetric septal hypertrophy noted (no mitral valve SAM or LVOT gradient). EF 45-50% with diffuse hypokinesis. Normal RV size and systolic function. No significant valvular abnormalities.  Assessment / Plan:  1. Chronic diastolic heart failure -  Have stressed the importance of sodium restriction. He appears to be back to baseline with higher dose of lasix- now 40 mg bid.  We'll continue with his current diuretic dose. Stressed the need for taking his medications as ordered. Recommend a pill organizer so he wont forget.   2. Chronic atrial fib - rate is controlled on digoxin and diltiazem. He is not symptomatic. Would continue current therapy.  3. Chronic anticoagulation - on Xarelto - reports some mild nosebleeds.    4. Interstitial lung disease/fibrosis - followed  by pulmonary.   5. Dementia  I will check lab work today including CBC, BMET, BNP, and Dig level. I  am concerned that with his dementia he may not be able to adequately able to care for himself. Will follow up in 3 months.

## 2014-01-29 NOTE — Patient Instructions (Signed)
Thank you for choosing [facility].  Summary/Instructions:  Your prescription(s) have been submitted to your pharmacy or been printed and provided for you. Please take as directed and contact our office if you believe you are having problem(s) with the medication(s) or have any questions.   We will call [facility] to determine your telemonitoring equipment status.   You have been given a prescription for condom catheters.

## 2014-01-30 ENCOUNTER — Telehealth: Payer: Self-pay

## 2014-01-30 LAB — CBC WITH DIFFERENTIAL/PLATELET
BASOS PCT: 1 % (ref 0–1)
Basophils Absolute: 0.1 10*3/uL (ref 0.0–0.1)
Eosinophils Absolute: 0.2 10*3/uL (ref 0.0–0.7)
Eosinophils Relative: 2 % (ref 0–5)
HEMATOCRIT: 41.6 % (ref 39.0–52.0)
HEMOGLOBIN: 13.6 g/dL (ref 13.0–17.0)
LYMPHS PCT: 8 % — AB (ref 12–46)
Lymphs Abs: 0.9 10*3/uL (ref 0.7–4.0)
MCH: 32.9 pg (ref 26.0–34.0)
MCHC: 32.7 g/dL (ref 30.0–36.0)
MCV: 100.5 fL — AB (ref 78.0–100.0)
MONOS PCT: 7 % (ref 3–12)
MPV: 9.6 fL (ref 8.6–12.4)
Monocytes Absolute: 0.8 10*3/uL (ref 0.1–1.0)
Neutro Abs: 9.1 10*3/uL — ABNORMAL HIGH (ref 1.7–7.7)
Neutrophils Relative %: 82 % — ABNORMAL HIGH (ref 43–77)
Platelets: 295 10*3/uL (ref 150–400)
RBC: 4.14 MIL/uL — ABNORMAL LOW (ref 4.22–5.81)
RDW: 15.4 % (ref 11.5–15.5)
WBC: 11.1 10*3/uL — ABNORMAL HIGH (ref 4.0–10.5)

## 2014-01-30 LAB — BASIC METABOLIC PANEL
BUN: 22 mg/dL (ref 6–23)
CALCIUM: 9.4 mg/dL (ref 8.4–10.5)
CHLORIDE: 100 meq/L (ref 96–112)
CO2: 32 meq/L (ref 19–32)
Creat: 0.99 mg/dL (ref 0.50–1.35)
GLUCOSE: 104 mg/dL — AB (ref 70–99)
Potassium: 4.9 mEq/L (ref 3.5–5.3)
SODIUM: 142 meq/L (ref 135–145)

## 2014-01-30 LAB — BRAIN NATRIURETIC PEPTIDE: Brain Natriuretic Peptide: 135.4 pg/mL — ABNORMAL HIGH (ref 0.0–100.0)

## 2014-01-30 LAB — DIGOXIN LEVEL: DIGOXIN LVL: 0.9 ng/mL (ref 0.8–2.0)

## 2014-01-30 NOTE — Telephone Encounter (Signed)
Tried calling pt to let him know that [place] does not do tele-monitoring equipment.

## 2014-01-31 ENCOUNTER — Ambulatory Visit (INDEPENDENT_AMBULATORY_CARE_PROVIDER_SITE_OTHER): Payer: Medicare Other | Admitting: Pulmonary Disease

## 2014-01-31 ENCOUNTER — Encounter: Payer: Self-pay | Admitting: Pulmonary Disease

## 2014-01-31 VITALS — BP 140/82 | HR 73 | Ht 68.0 in | Wt 204.6 lb

## 2014-01-31 DIAGNOSIS — J84112 Idiopathic pulmonary fibrosis: Secondary | ICD-10-CM | POA: Diagnosis not present

## 2014-01-31 DIAGNOSIS — G4730 Sleep apnea, unspecified: Secondary | ICD-10-CM | POA: Diagnosis not present

## 2014-01-31 DIAGNOSIS — M069 Rheumatoid arthritis, unspecified: Secondary | ICD-10-CM | POA: Diagnosis not present

## 2014-01-31 MED ORDER — BENZONATATE 200 MG PO CAPS: 200.0000 mg | ORAL_CAPSULE | Freq: Three times a day (TID) | ORAL | Status: DC | PRN

## 2014-01-31 NOTE — Progress Notes (Signed)
   Subjective:    Patient ID: [name], male    DOB: Jul 27, 1931, 81 y.o.   MRN: [id number]  HPI  PCP - Asa Lente  [place] - [place]  Cards- [place]   81 year old male with pulmonary Fibrosis (UIP pattern), RA and OSA , chronic diastolic CHF.   Significant tests/ events  Admited 3/ 2011 with A Flutter/Fib and CHF exacerbation and needed ICU stay and amiodarone. CT showed evidence of diffuse pulmonary fibrosis.  Cath 04/06/2009 normal coronaries.  PSG 8/11 showed - moderate OSA - AHI 22/h, lowest desatn 78% - corrected by cpaP 9 cm with med FF mask  ? persistent REM related desaturation on 9 cm related to underlying cardiopulmonary disease  October, 2011 ONO on CPAP - destauration x 9 mins only over total recording time x 10 h,   09/2012 -BNP 172, ESR 35, RA 38, CCP neg   Ba swallow 10/20/12- Focal esophagitis just above the thoracic inlet suggesting Barrett's esophagus. there is no stricture. Diffuse esophageal motility disorder. Flash laryngeal penetration with swallowing.   09/15/2013 CT chest  >> Fibrosis c/w UIP stable compared to 09/30/2012 (although significantly progressed when compared to 04/03/2009)  PFTs - FVC 68%, TLC 59%, DLCO 56% - stable DLCO 51% in 09/2012  01/31/2014  Chief Complaint  Patient presents with  . Follow-up    Patient states he is unable to remember things; breathing is doing pretty good; coughing up a lot of clear phlegm, sometimes has blood in it; sometimes gets a little blood in nostrils.  Pt not using CPAP machine right now.  Patient says he is having to sleep sitting straight up in the bed, he said that he has been able to sleep better that way without the CPAP   82mFU 10/2013 hosp for acute dCHF >> diuresed 10L - Echo EF5%, diffuse hypokinesis - dc to rehab, now back home Has hospital bed, not using cpap Would like a condom catheter, on lasix twice daily  Uses walker  His memory is failing  His dyspnea appears or worse, but  it is difficult to assess, since as ambulation is limited now. No flareup of arthritis.  He reports good compliance with CPAP.  Lasix dose was dropped to 40 mg twice daily and he seems to be doing okay with this For some reason, he is on pred 517mtid     Review of Systems neg for any significant sore throat, dysphagia, itching, sneezing, nasal congestion or excess/ purulent secretions, fever, chills, sweats, unintended wt loss, pleuritic or exertional cp, hempoptysis, orthopnea pnd or change in chronic leg swelling. Also denies presyncope, palpitations, heartburn, abdominal pain, nausea, vomiting, diarrhea or change in bowel or urinary habits, dysuria,hematuria, rash, arthralgias, visual complaints, headache, numbness weakness or ataxia.     Objective:   Physical Exam  Gen. Pleasant, obese, in no distress, normal affect ENT - no lesions, no post nasal drip, class 2 airway Neck: No JVD, no thyromegaly, no carotid bruits Lungs: no use of accessory muscles, no dullness to percussion, bibasal 1/3 rales, no rhonchi  Cardiovascular: Rhythm regular, heart sounds  normal, no murmurs or gallops, no peripheral edema Abdomen: soft and non-tender, no hepatosplenomegaly, BS normal. Musculoskeletal: No deformities, no cyanosis or clubbing Neuro:  alert, non focal, no tremors       Assessment & Plan:

## 2014-01-31 NOTE — Patient Instructions (Signed)
Tessalon perles 200 mg  thrice daily as needed for cough OK to take DELSYM 5 ml po at night for sleep Get back on CPAP if possible

## 2014-02-01 NOTE — Assessment & Plan Note (Signed)
Get back on CPAP 12 cm

## 2014-02-01 NOTE — Assessment & Plan Note (Signed)
Tessalon perles 200 mg  thrice daily as needed for cough OK to take DELSYM 5 ml po at night for sleep  Unfortunately, with his other issues, it is becoming increasingly difficult for him to stay home by himself. He would like to maintain his independence - 'people who get into homes just wither away & die' - I hope we can avoid hospitalisations - may need home care.

## 2014-02-01 NOTE — Assessment & Plan Note (Signed)
For some reason he is on tid prednisone, can drop to 10 mg daily instead

## 2014-02-02 ENCOUNTER — Telehealth: Payer: Self-pay | Admitting: Internal Medicine

## 2014-02-02 NOTE — Telephone Encounter (Signed)
Pt has questions about prilosec and mematine, dosage?  [phone]

## 2014-02-02 NOTE — Telephone Encounter (Signed)
LVM for pt to call back.

## 2014-02-10 DIAGNOSIS — H8110 Benign paroxysmal vertigo, unspecified ear: Secondary | ICD-10-CM | POA: Diagnosis not present

## 2014-02-12 ENCOUNTER — Other Ambulatory Visit: Payer: Self-pay

## 2014-02-12 MED ORDER — MEMANTINE HCL 10 MG PO TABS: 10.0000 mg | ORAL_TABLET | Freq: Every day | ORAL | Status: DC

## 2014-02-13 NOTE — Telephone Encounter (Signed)
Pt is needing in home care, he wants to know if she can get this started   [facility] [phone]

## 2014-02-16 ENCOUNTER — Other Ambulatory Visit: Payer: Self-pay | Admitting: Internal Medicine

## 2014-02-16 DIAGNOSIS — J84112 Idiopathic pulmonary fibrosis: Secondary | ICD-10-CM

## 2014-02-16 DIAGNOSIS — I4892 Unspecified atrial flutter: Secondary | ICD-10-CM

## 2014-02-16 DIAGNOSIS — M069 Rheumatoid arthritis, unspecified: Secondary | ICD-10-CM

## 2014-02-16 NOTE — Telephone Encounter (Signed)
HH referral entered per MD

## 2014-02-16 NOTE — Telephone Encounter (Signed)
I have added problems from problem list to associate with referral to HH: RA, AF, IPF thanks

## 2014-02-20 ENCOUNTER — Inpatient Hospital Stay (HOSPITAL_COMMUNITY)
Admission: RE | Admit: 2014-02-20 | Discharge: 2014-02-23 | DRG: 291 | Disposition: A | Discharge status: Skilled Nursing Facility | Payer: Medicare Other | Attending: Internal Medicine | Admitting: Internal Medicine

## 2014-02-20 ENCOUNTER — Emergency Department (HOSPITAL_COMMUNITY): Payer: Medicare Other

## 2014-02-20 ENCOUNTER — Encounter (HOSPITAL_COMMUNITY): Payer: Self-pay | Admitting: Emergency Medicine

## 2014-02-20 DIAGNOSIS — N390 Urinary tract infection, site not specified: Secondary | ICD-10-CM | POA: Diagnosis not present

## 2014-02-20 DIAGNOSIS — Z7409 Other reduced mobility: Secondary | ICD-10-CM | POA: Diagnosis not present

## 2014-02-20 DIAGNOSIS — R319 Hematuria, unspecified: Secondary | ICD-10-CM | POA: Diagnosis present

## 2014-02-20 DIAGNOSIS — Z6830 Body mass index (BMI) 30.0-30.9, adult: Secondary | ICD-10-CM

## 2014-02-20 DIAGNOSIS — I5043 Acute on chronic combined systolic (congestive) and diastolic (congestive) heart failure: Secondary | ICD-10-CM | POA: Diagnosis not present

## 2014-02-20 DIAGNOSIS — Z7901 Long term (current) use of anticoagulants: Secondary | ICD-10-CM | POA: Diagnosis not present

## 2014-02-20 DIAGNOSIS — I5032 Chronic diastolic (congestive) heart failure: Secondary | ICD-10-CM | POA: Diagnosis not present

## 2014-02-20 DIAGNOSIS — Z905 Acquired absence of kidney: Secondary | ICD-10-CM | POA: Diagnosis not present

## 2014-02-20 DIAGNOSIS — Z9114 Patient's other noncompliance with medication regimen: Secondary | ICD-10-CM | POA: Diagnosis not present

## 2014-02-20 DIAGNOSIS — R32 Unspecified urinary incontinence: Secondary | ICD-10-CM | POA: Diagnosis present

## 2014-02-20 DIAGNOSIS — R310 Gross hematuria: Secondary | ICD-10-CM | POA: Diagnosis not present

## 2014-02-20 DIAGNOSIS — R0600 Dyspnea, unspecified: Secondary | ICD-10-CM | POA: Diagnosis not present

## 2014-02-20 DIAGNOSIS — B9689 Other specified bacterial agents as the cause of diseases classified elsewhere: Secondary | ICD-10-CM | POA: Diagnosis not present

## 2014-02-20 DIAGNOSIS — I517 Cardiomegaly: Secondary | ICD-10-CM | POA: Diagnosis not present

## 2014-02-20 DIAGNOSIS — F419 Anxiety disorder, unspecified: Secondary | ICD-10-CM | POA: Diagnosis not present

## 2014-02-20 DIAGNOSIS — Z87891 Personal history of nicotine dependence: Secondary | ICD-10-CM

## 2014-02-20 DIAGNOSIS — E785 Hyperlipidemia, unspecified: Secondary | ICD-10-CM | POA: Diagnosis not present

## 2014-02-20 DIAGNOSIS — R531 Weakness: Secondary | ICD-10-CM | POA: Diagnosis not present

## 2014-02-20 DIAGNOSIS — J8410 Pulmonary fibrosis, unspecified: Secondary | ICD-10-CM | POA: Diagnosis not present

## 2014-02-20 DIAGNOSIS — R030 Elevated blood-pressure reading, without diagnosis of hypertension: Secondary | ICD-10-CM | POA: Diagnosis not present

## 2014-02-20 DIAGNOSIS — R918 Other nonspecific abnormal finding of lung field: Secondary | ICD-10-CM | POA: Diagnosis not present

## 2014-02-20 DIAGNOSIS — J8489 Other specified interstitial pulmonary diseases: Secondary | ICD-10-CM | POA: Diagnosis not present

## 2014-02-20 DIAGNOSIS — J441 Chronic obstructive pulmonary disease with (acute) exacerbation: Secondary | ICD-10-CM | POA: Diagnosis present

## 2014-02-20 DIAGNOSIS — I509 Heart failure, unspecified: Secondary | ICD-10-CM | POA: Diagnosis not present

## 2014-02-20 DIAGNOSIS — I5020 Unspecified systolic (congestive) heart failure: Secondary | ICD-10-CM | POA: Diagnosis not present

## 2014-02-20 DIAGNOSIS — M069 Rheumatoid arthritis, unspecified: Secondary | ICD-10-CM | POA: Diagnosis present

## 2014-02-20 DIAGNOSIS — R0602 Shortness of breath: Secondary | ICD-10-CM | POA: Diagnosis present

## 2014-02-20 DIAGNOSIS — F0390 Unspecified dementia without behavioral disturbance: Secondary | ICD-10-CM | POA: Diagnosis not present

## 2014-02-20 DIAGNOSIS — K219 Gastro-esophageal reflux disease without esophagitis: Secondary | ICD-10-CM | POA: Diagnosis not present

## 2014-02-20 DIAGNOSIS — G4733 Obstructive sleep apnea (adult) (pediatric): Secondary | ICD-10-CM | POA: Diagnosis present

## 2014-02-20 DIAGNOSIS — I10 Essential (primary) hypertension: Secondary | ICD-10-CM | POA: Diagnosis not present

## 2014-02-20 DIAGNOSIS — Z7952 Long term (current) use of systemic steroids: Secondary | ICD-10-CM

## 2014-02-20 DIAGNOSIS — I482 Chronic atrial fibrillation, unspecified: Secondary | ICD-10-CM | POA: Diagnosis present

## 2014-02-20 DIAGNOSIS — G629 Polyneuropathy, unspecified: Secondary | ICD-10-CM | POA: Diagnosis not present

## 2014-02-20 DIAGNOSIS — M2550 Pain in unspecified joint: Secondary | ICD-10-CM | POA: Diagnosis not present

## 2014-02-20 DIAGNOSIS — B999 Unspecified infectious disease: Secondary | ICD-10-CM | POA: Diagnosis not present

## 2014-02-20 DIAGNOSIS — I872 Venous insufficiency (chronic) (peripheral): Secondary | ICD-10-CM | POA: Diagnosis not present

## 2014-02-20 DIAGNOSIS — J9601 Acute respiratory failure with hypoxia: Secondary | ICD-10-CM

## 2014-02-20 DIAGNOSIS — I248 Other forms of acute ischemic heart disease: Secondary | ICD-10-CM | POA: Diagnosis not present

## 2014-02-20 DIAGNOSIS — I5023 Acute on chronic systolic (congestive) heart failure: Secondary | ICD-10-CM | POA: Diagnosis not present

## 2014-02-20 DIAGNOSIS — F329 Major depressive disorder, single episode, unspecified: Secondary | ICD-10-CM | POA: Diagnosis present

## 2014-02-20 DIAGNOSIS — J449 Chronic obstructive pulmonary disease, unspecified: Secondary | ICD-10-CM | POA: Diagnosis not present

## 2014-02-20 DIAGNOSIS — M6281 Muscle weakness (generalized): Secondary | ICD-10-CM | POA: Diagnosis not present

## 2014-02-20 DIAGNOSIS — R609 Edema, unspecified: Secondary | ICD-10-CM | POA: Diagnosis not present

## 2014-02-20 DIAGNOSIS — R488 Other symbolic dysfunctions: Secondary | ICD-10-CM | POA: Diagnosis not present

## 2014-02-20 DIAGNOSIS — F82 Specific developmental disorder of motor function: Secondary | ICD-10-CM | POA: Diagnosis not present

## 2014-02-20 DIAGNOSIS — G4700 Insomnia, unspecified: Secondary | ICD-10-CM | POA: Diagnosis not present

## 2014-02-20 DIAGNOSIS — Z888 Allergy status to other drugs, medicaments and biological substances status: Secondary | ICD-10-CM | POA: Diagnosis not present

## 2014-02-20 DIAGNOSIS — R2681 Unsteadiness on feet: Secondary | ICD-10-CM | POA: Diagnosis not present

## 2014-02-20 HISTORY — DX: Pulmonary fibrosis, unspecified: J84.10

## 2014-02-20 LAB — CBC WITH DIFFERENTIAL/PLATELET
BASOS ABS: 0.1 10*3/uL (ref 0.0–0.1)
Basophils Relative: 1 % (ref 0–1)
EOS ABS: 0.4 10*3/uL (ref 0.0–0.7)
EOS PCT: 4 % (ref 0–5)
HCT: 38.8 % — ABNORMAL LOW (ref 39.0–52.0)
Hemoglobin: 12.5 g/dL — ABNORMAL LOW (ref 13.0–17.0)
LYMPHS PCT: 14 % (ref 12–46)
Lymphs Abs: 1.1 10*3/uL (ref 0.7–4.0)
MCH: 32.5 pg (ref 26.0–34.0)
MCHC: 32.2 g/dL (ref 30.0–36.0)
MCV: 100.8 fL — ABNORMAL HIGH (ref 78.0–100.0)
MONO ABS: 0.8 10*3/uL (ref 0.1–1.0)
Monocytes Relative: 10 % (ref 3–12)
NEUTROS PCT: 71 % (ref 43–77)
Neutro Abs: 5.9 10*3/uL (ref 1.7–7.7)
Platelets: 258 10*3/uL (ref 150–400)
RBC: 3.85 MIL/uL — AB (ref 4.22–5.81)
RDW: 15.3 % (ref 11.5–15.5)
WBC: 8.3 10*3/uL (ref 4.0–10.5)

## 2014-02-20 LAB — BASIC METABOLIC PANEL
ANION GAP: 7 (ref 5–15)
BUN: 12 mg/dL (ref 6–23)
CO2: 27 mmol/L (ref 19–32)
Calcium: 8.6 mg/dL (ref 8.4–10.5)
Chloride: 101 mmol/L (ref 96–112)
Creatinine, Ser: 0.93 mg/dL (ref 0.50–1.35)
GFR calc non Af Amer: 77 mL/min — ABNORMAL LOW (ref >90)
GFR, EST AFRICAN AMERICAN: 90 mL/min — AB (ref >90)
Glucose, Bld: 106 mg/dL — ABNORMAL HIGH (ref 70–99)
Potassium: 4.1 mmol/L (ref 3.5–5.1)
Sodium: 135 mmol/L (ref 135–145)

## 2014-02-20 LAB — URINALYSIS, ROUTINE W REFLEX MICROSCOPIC
Bilirubin Urine: NEGATIVE
GLUCOSE, UA: NEGATIVE mg/dL
Ketones, ur: NEGATIVE mg/dL
Nitrite: POSITIVE — AB
PH: 7 (ref 5.0–8.0)
PROTEIN: NEGATIVE mg/dL
Specific Gravity, Urine: 1.013 (ref 1.005–1.030)
UROBILINOGEN UA: 0.2 mg/dL (ref 0.0–1.0)

## 2014-02-20 LAB — I-STAT TROPONIN, ED: Troponin i, poc: 0.1 ng/mL (ref 0.00–0.08)

## 2014-02-20 LAB — URINE MICROSCOPIC-ADD ON

## 2014-02-20 LAB — TROPONIN I
TROPONIN I: 0.13 ng/mL — AB (ref <0.031)
Troponin I: 0.12 ng/mL — ABNORMAL HIGH (ref <0.031)

## 2014-02-20 MED ORDER — LEFLUNOMIDE 20 MG PO TABS
20.0000 mg | ORAL_TABLET | Freq: Every day | ORAL | Status: DC
  Administered 2014-02-21 – 2014-02-23 (×3): 20 mg via ORAL
  Filled 2014-02-20 (×3): qty 1

## 2014-02-20 MED ORDER — MELATONIN 5 MG PO CAPS: 1.0000 | ORAL_CAPSULE | Freq: Every day | ORAL | Status: DC

## 2014-02-20 MED ORDER — FUROSEMIDE 40 MG PO TABS: 40.0000 mg | ORAL_TABLET | Freq: Two times a day (BID) | ORAL | Status: DC

## 2014-02-20 MED ORDER — DIGOXIN 125 MCG PO TABS
0.1250 mg | ORAL_TABLET | Freq: Every day | ORAL | Status: DC
  Administered 2014-02-22 – 2014-02-23 (×2): 0.125 mg via ORAL
  Filled 2014-02-20 (×3): qty 1

## 2014-02-20 MED ORDER — SODIUM CHLORIDE 0.9 % IV SOLN: INTRAVENOUS | Status: DC

## 2014-02-20 MED ORDER — DILTIAZEM HCL ER COATED BEADS 300 MG PO CP24
300.0000 mg | ORAL_CAPSULE | Freq: Every day | ORAL | Status: DC
  Administered 2014-02-21 – 2014-02-23 (×3): 300 mg via ORAL
  Filled 2014-02-20 (×3): qty 1

## 2014-02-20 MED ORDER — ASPIRIN EC 325 MG PO TBEC
325.0000 mg | DELAYED_RELEASE_TABLET | Freq: Every day | ORAL | Status: DC
  Filled 2014-02-20: qty 1

## 2014-02-20 MED ORDER — CALCIUM CARBONATE 1250 (500 CA) MG PO TABS
1.0000 | ORAL_TABLET | Freq: Every day | ORAL | Status: DC
  Administered 2014-02-20 – 2014-02-23 (×4): 500 mg via ORAL
  Filled 2014-02-20 (×5): qty 1

## 2014-02-20 MED ORDER — MEMANTINE HCL 10 MG PO TABS
10.0000 mg | ORAL_TABLET | Freq: Every day | ORAL | Status: DC
  Administered 2014-02-21 – 2014-02-23 (×3): 10 mg via ORAL
  Filled 2014-02-20 (×3): qty 1

## 2014-02-20 MED ORDER — CORAL CALCIUM 1000 (390 CA) MG PO TABS: 2.0000 | ORAL_TABLET | Freq: Every day | ORAL | Status: DC

## 2014-02-20 MED ORDER — RIVAROXABAN 20 MG PO TABS
20.0000 mg | ORAL_TABLET | Freq: Every day | ORAL | Status: DC
  Administered 2014-02-20: 20 mg via ORAL
  Filled 2014-02-20: qty 1

## 2014-02-20 MED ORDER — PREDNISOLONE 5 MG PO TABS
5.0000 mg | ORAL_TABLET | Freq: Three times a day (TID) | ORAL | Status: DC
  Administered 2014-02-20 – 2014-02-21 (×2): 5 mg via ORAL
  Filled 2014-02-20 (×4): qty 1

## 2014-02-20 MED ORDER — DEXTROSE 5 % IV SOLN
1.0000 g | Freq: Once | INTRAVENOUS | Status: AC
  Administered 2014-02-20: 1 g via INTRAVENOUS
  Filled 2014-02-20: qty 10

## 2014-02-20 MED ORDER — HYDROCODONE-ACETAMINOPHEN 5-325 MG PO TABS
1.0000 | ORAL_TABLET | Freq: Four times a day (QID) | ORAL | Status: DC
  Administered 2014-02-21 – 2014-02-23 (×9): 1 via ORAL
  Filled 2014-02-20 (×9): qty 1

## 2014-02-20 MED ORDER — PANTOPRAZOLE SODIUM 40 MG PO TBEC
40.0000 mg | DELAYED_RELEASE_TABLET | Freq: Every day | ORAL | Status: DC
  Administered 2014-02-20 – 2014-02-23 (×4): 40 mg via ORAL
  Filled 2014-02-20 (×4): qty 1

## 2014-02-20 MED ORDER — GUAIFENESIN ER 600 MG PO TB12
300.0000 mg | ORAL_TABLET | Freq: Two times a day (BID) | ORAL | Status: DC
  Administered 2014-02-20 – 2014-02-23 (×6): 600 mg via ORAL
  Filled 2014-02-20 (×7): qty 1

## 2014-02-20 MED ORDER — GABAPENTIN 400 MG PO CAPS
1200.0000 mg | ORAL_CAPSULE | Freq: Every day | ORAL | Status: DC
  Administered 2014-02-21 – 2014-02-22 (×2): 1200 mg via ORAL
  Filled 2014-02-20 (×3): qty 3

## 2014-02-20 MED ORDER — SODIUM CHLORIDE 0.9 % IJ SOLN
3.0000 mL | Freq: Two times a day (BID) | INTRAMUSCULAR | Status: DC
  Administered 2014-02-21 – 2014-02-23 (×4): 3 mL via INTRAVENOUS

## 2014-02-20 MED ORDER — SODIUM CHLORIDE 0.9 % IV SOLN
INTRAVENOUS | Status: DC
  Administered 2014-02-21: 01:00:00 via INTRAVENOUS

## 2014-02-20 MED ORDER — DEXTROSE 5 % IV SOLN
1.0000 g | INTRAVENOUS | Status: DC
  Administered 2014-02-21 – 2014-02-23 (×2): 1 g via INTRAVENOUS
  Filled 2014-02-20 (×3): qty 10

## 2014-02-20 MED ORDER — BENZONATATE 100 MG PO CAPS
200.0000 mg | ORAL_CAPSULE | Freq: Three times a day (TID) | ORAL | Status: DC | PRN
  Administered 2014-02-20: 200 mg via ORAL
  Filled 2014-02-20 (×2): qty 2

## 2014-02-20 NOTE — Progress Notes (Signed)
CSW met with patient at bedside. There was no family present. Patient states he WAS BIB EMS and comes from [facility]. Patient states that he lives independently there and does not receive help with cleaning, medications, or ADL's. Patient states that in the past he has been driving, but has not done so in the past couple of days due to his health.   Patient informed CSW that he needs help while at home. Patient states that he is interested in assisted living. Patient stated " I have no one. I have absolutely no help".  Also, patient stated " I cannot even do my medicines right.".  Patient told CSW that he is interested in assisted living, and that he would not go back to his apartment. Patient's states that he is interested in [facility]. At this time, it is not for certain if patient will be admitted. Patient was informed that he may have to pay out of pocket for facility. 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 CSW will consult with physician.   CSW informed patient that home health options are also available. However, the patient did not like the idea of receiving help at home.  Patient informed CSW that his house is cluttered,and he has not been able to clean.  CSW reached out to patient's sister per patient request. Sister stated that she would like to be informed of where the patient may go upon discharge.  CSW will file report with APS.  Sister/Mary Anne [phone]  Willette Brace [phone] ED CSW 02/20/2014 9:40 PM

## 2014-02-20 NOTE — H&P (Addendum)
[facility] History and Physical  [name]:[id number] DOB: 1932/01/09 DOA: 02/20/2014  Referring physician: EDP PCP: [name], MD   Chief Complaint: Inability to do ADLs   HPI: [name] is a 81 y.o. male with history of dementia, A.Fib, CHF, COPD, presents to ED for evaluation of "not being able to care for myself".  Patient lives alone in [place] [place] [facility] in [place] and has realized he is unable to care for himself anymore.  Unable to fill scripts because he forgets about them, does not remember which meds he has taken and which he hasnt, denies any complaints or acute medical conditions at this time.  Work up in ED is suspicious for UTI.  Patient also has slightly elevated troponin of 0.12.  Review of Systems: Systems reviewed.  As above, otherwise negative  Past Medical History  Diagnosis Date  . Atrial flutter     a. s/p ablation for RVR  03/2009, Xarelto ongoing  . VENOUS INSUFFICIENCY, LEFT LEG   . PULMONARY FIBROSIS, INTERSTITIAL   . OBSTRUCTIVE SLEEP APNEA   . BENIGN POSITIONAL VERTIGO   . HYPERGLYCEMIA 2011  . ALLERGIC RHINITIS   . Rheumatoid arthritis(714.0)   . Chronic diastolic CHF (congestive heart failure)     a. 10/2013 Echo: EF 45-50%, mild TR, mildly dil LA/RA.  Marland Kitchen PERIPHERAL NEUROPATHY   . HYPERTENSION   . HYPERLIPIDEMIA   . DEPRESSION     BH hosp 01/2011 for SI  . Anxiety   . Morbid obesity   . Diverticulosis of colon (without mention of hemorrhage)   . Atrial fibrillation     a. chronic xarelto;  b. prev on amio->d/c'd 2/2 pulmonary fibrosis.   Past Surgical History  Procedure Laterality Date  . 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 Shoulder surgery Right 09/2009  . Varicose vein surgery      Left Leg  . Radiofrequency ablation    . Colonoscopy w/ biopsies     Social History:  reports that he quit smoking about 14 years ago. His smoking use included  Cigarettes. He has a 3 pack-year smoking history. He has never used smokeless tobacco. He reports that he does not drink alcohol or use illicit drugs.  Allergies  Allergen Reactions  . Methotrexate Swelling    REACTION: ONLY HAS ONE KIDNEY, caused swelling in hands and feet    Family History  Problem Relation Age of Onset  . Heart attack Father 53  . Other Mother 66    Old age  . Colon cancer Neg Hx      Prior to Admission medications   Medication Sig Start Date End Date Taking? Authorizing Provider  Cholecalciferol (VITAMIN D3) 1000 UNITS CAPS Take 1 capsule by mouth daily.    Yes Historical Provider, MD  Coral Calcium 1000 (390 CA) MG TABS Take 2 capsules by mouth daily.    Yes Historical Provider, MD  digoxin (LANOXIN) 0.125 MG tablet Take 1 tablet (0.125 mg total) by mouth daily. 12/01/13  Yes Dwana Melena, PA-C  diltiazem (CARDIZEM CD) 300 MG 24 hr capsule Take 1 capsule (300 mg total) by mouth daily. 12/01/13  Yes Dwana Melena, PA-C  furosemide (LASIX) 40 MG tablet Take 40 mg by mouth 2 (two) times daily.  11/10/13  Yes Historical Provider, MD  gabapentin (NEURONTIN) 600 MG tablet Take 1,200 mg by mouth at bedtime.  Yes Historical Provider, MD  guaiFENesin (MUCINEX) 600 MG 12 hr tablet Take 300 mg by mouth 2 (two) times daily. Take 1/2 tablet twice a day   Yes Historical Provider, MD  HYDROcodone-acetaminophen (NORCO/VICODIN) 5-325 MG per tablet Take 1 tablet by mouth every 6 (six) hours. 10/12/13  Yes Historical Provider, MD  leflunomide (ARAVA) 20 MG tablet Take 20 mg by mouth daily. 10/10/13  Yes Historical Provider, MD  Melatonin 5 MG CAPS Take 1 capsule by mouth at bedtime.   Yes Historical Provider, MD  memantine (NAMENDA) 10 MG tablet Take 1 tablet (10 mg total) by mouth daily. 02/12/14  Yes Newt Lukes, MD  Multiple Vitamin (MULTIVITAMIN) tablet Take 1 tablet by mouth daily.     Yes Historical Provider, MD  OMEGA-3 1000 MG CAPS Take 1,000 mg by mouth daily.    Yes  Historical Provider, MD  omeprazole (PRILOSEC) 20 MG capsule Take 1 capsule (20 mg total) by mouth 2 (two) times daily. 07/07/13  Yes Newt Lukes, MD  prednisoLONE 5 MG TABS tablet Take 5 mg by mouth 3 (three) times daily.   Yes Historical Provider, MD  rivaroxaban (XARELTO) 20 MG TABS tablet Take 1 tablet (20 mg total) by mouth daily with supper. 12/01/13  Yes Dwana Melena, PA-C  benzonatate (TESSALON) 200 MG capsule Take 1 capsule (200 mg total) by mouth 3 (three) times daily as needed for cough. 01/31/14   Oretha Milch, MD   Physical Exam: Filed Vitals:   02/20/14 2000  BP: 135/71  Pulse: 73  Temp:   Resp: 18    BP 135/71 mmHg  Pulse 73  Temp(Src) 98.4 F (36.9 C) (Oral)  Resp 18  SpO2 95%  General Appearance:    Alert, oriented, disorganized thoughts, appears stated age  Head:    Normocephalic, atraumatic  Eyes:    PERRL, EOMI, sclera non-icteric        Nose:   Nares without drainage or epistaxis. Mucosa, turbinates normal  Throat:   Moist mucous membranes. Oropharynx without erythema or exudate.  Neck:   Supple. No carotid bruits.  No thyromegaly.  No lymphadenopathy.   Back:     No CVA tenderness, no spinal tenderness  Lungs:     Clear to auscultation bilaterally, without wheezes, rhonchi or rales  Chest wall:    No tenderness to palpitation  Heart:    Regular rate and rhythm without murmurs, gallops, rubs  Abdomen:     Soft, non-tender, nondistended, normal bowel sounds, no organomegaly  Genitalia:    deferred  Rectal:    deferred  Extremities:   No clubbing, cyanosis or edema.  Pulses:   2+ and symmetric all extremities  Skin:   Skin color, texture, turgor normal, no rashes or lesions  Lymph nodes:   Cervical, supraclavicular, and axillary nodes normal  Neurologic:   CNII-XII intact. Normal strength, sensation and reflexes      throughout    Labs on Admission:  Basic Metabolic Panel:  Recent Labs Lab 02/20/14 1853  NA 135  K 4.1  CL 101  CO2 27   GLUCOSE 106*  BUN 12  CREATININE 0.93  CALCIUM 8.6   Liver Function Tests: No results for input(s): AST, ALT, ALKPHOS, BILITOT, PROT, ALBUMIN in the last 168 hours. No results for input(s): LIPASE, AMYLASE in the last 168 hours. No results for input(s): AMMONIA in the last 168 hours. CBC:  Recent Labs Lab 02/20/14 1853  WBC 8.3  NEUTROABS 5.9  HGB 12.5*  HCT 38.8*  MCV 100.8*  PLT 258   Cardiac Enzymes:  Recent Labs Lab 02/20/14 1922  TROPONINI 0.12*    BNP (last 3 results)  Recent Labs  11/15/13 2139  PROBNP 4542.0*   CBG: No results for input(s): GLUCAP in the last 168 hours.  Radiological Exams on Admission: Dg Chest 2 View  02/20/2014   CLINICAL DATA:  Weakness, general malaise, COPD.  EXAM: CHEST  2 VIEW  COMPARISON:  11/15/2013  FINDINGS: There is severe bilateral chronic interstitial lung disease. There is no pleural effusion or pneumothorax. There is no new focal parenchymal opacity. There is stable cardiomegaly. There is no acute osseous abnormality.  IMPRESSION: Severe bilateral chronic interstitial lung disease. Mild superimposed pneumonitis cannot be excluded.   Electronically Signed   By: Elige Ko   On: 02/20/2014 20:24    EKG: Independently reviewed.  Assessment/Plan Principal Problem:   UTI (lower urinary tract infection) Active Problems:   Chronic a-fib   Chronic diastolic heart failure   Demand ischemia of myocardium - possible   Impaired mobility and ADLs   1. UTI - 1. Cultures pending 2. Rocephin empirically 2. Elevated troponin - suspect either demand ischemia vs chronically elevated troponin.  Patient denies chest pain or SOB at this time.  Doubt that this represents type 1 MI or acute ACS as a result. 1. NPO except meds 2. [facility] ([facility] and requests transfer). 3. Serial troponins 4. Holding lasix and putting patient on NS at 75 cc/hr while NPO. 5. Tele monitor 3. A.Fib 1. Continue  rate control 4. Inability to perform ADL's 1. SW consult 5. Hematuria - 1. Patient with gross hematuria but without clots in urine today 2. Check PSA 3. Hold Xeralto for now which he takes for A.Fib 4. SCDs for DVT ppx.    Code Status: Full  Family Communication: No family in room Disposition Plan: Admit to [facility], tele, inpatient   Time spent: 70 min  GARDNER, JARED M. [facility] Pager [phone]  If 7AM-7PM, please contact the day team taking care of the patient [email or web address] Password [facility] 02/20/2014, 9:25 PM

## 2014-02-20 NOTE — ED Notes (Signed)
Pt states, "I need help, i have a critical heart condition as well as my lungs, I also have Alzheimer's and I cant keep up or get my medications.  I also dont have any family or anyone that can help me".

## 2014-02-20 NOTE — Progress Notes (Signed)
CSW filed APS report with Leonette Most of DSS. Leonette Most will update CSW of findings.  Trish Mage [phone] ED CSW 02/20/2014 10:48 PM

## 2014-02-20 NOTE — Progress Notes (Signed)
PHARMACIST - PHYSICIAN ORDER COMMUNICATION  CONCERNING: P&T Medication Policy on Herbal Medications  DESCRIPTION:  This patient's order for:  Melatonin  has been noted.  This product(s) is classified as an "herbal" or natural product. Due to a lack of definitive safety studies or FDA approval, nonstandard manufacturing practices, plus the potential risk of unknown drug-drug interactions while on inpatient medications, the Pharmacy and Therapeutics Committee does not permit the use of "herbal" or natural products of this type within [facility].   ACTION TAKEN: The pharmacy department is unable to verify this order at this time and your patient has been informed of this safety policy. Please reevaluate patient's clinical condition at discharge and address if the herbal or natural product(s) should be resumed at that time.  Loralee Pacas, PharmD, BCPS 02/20/2014 9:31 PM Pharmacy: [phone]

## 2014-02-20 NOTE — ED Notes (Signed)
Bed: OI78 Expected date:  Expected time:  Means of arrival:  Comments: Ems-elderly med clearance

## 2014-02-20 NOTE — ED Provider Notes (Signed)
CSN: [id number]     Arrival date & time 02/20/14  1745 History   First MD Initiated Contact with Patient 02/20/14 1750     Chief Complaint  Patient presents with  . Medical Clearance  . can't do ADL's      (Consider location/radiation/quality/duration/timing/severity/associated sxs/prior Treatment) HPI [name] is a 81 y.o. male with a history of dementia, A. fib, heart failure, COPD, hypertension, hyperlipidemia comes in for evaluation of "not being able to care for myself". Patient reports he lives alone in [place] in [place] and he has realized that he is unable to care for himself anymore. He reports he is unable to fill his prescriptions because he forgets about them, he does not remember what medicines he has taken about medicines he has not. He denies any acute medical problems at this time, but seeks help in securing living assistance. Denies headache, vision changes, chest pain, source of breath, cough, nausea or vomiting, diarrhea constipation, abdominal pain, urinary symptoms, numbness or weakness, syncope, new leg swelling.  Past Medical History  Diagnosis Date  . Atrial flutter     a. s/p ablation for RVR  03/2009, Xarelto ongoing  . VENOUS INSUFFICIENCY, LEFT LEG   . PULMONARY FIBROSIS, INTERSTITIAL   . OBSTRUCTIVE SLEEP APNEA   . BENIGN POSITIONAL VERTIGO   . HYPERGLYCEMIA 2011  . ALLERGIC RHINITIS   . Rheumatoid arthritis(714.0)   . Chronic diastolic CHF (congestive heart failure)     a. 10/2013 Echo: EF 45-50%, mild TR, mildly dil LA/RA.  Marland Kitchen PERIPHERAL NEUROPATHY   . HYPERTENSION   . HYPERLIPIDEMIA   . DEPRESSION     [facility] hosp 01/2011 for SI  . Anxiety   . Morbid obesity   . Diverticulosis of colon (without mention of hemorrhage)   . Atrial fibrillation     a. chronic xarelto;  b. prev on amio->d/c'd 2/2 pulmonary fibrosis.   Past Surgical History  Procedure Laterality Date  . 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 Shoulder surgery Right 09/2009  . Varicose vein surgery      Left Leg  . Radiofrequency ablation    . Colonoscopy w/ biopsies     Family History  Problem Relation Age of Onset  . Heart attack Father 30  . Other Mother 61    Old age  . Colon cancer Neg Hx    History  Substance Use Topics  . Smoking status: Former Smoker -- 1.00 packs/day for 3 years    Types: Cigarettes    Quit date: 01/20/2000  . Smokeless tobacco: Never Used     Comment: Single, lives alone. 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 His family contact would be his sister [name] [phone]  . Alcohol Use: No     Comment: Former    Review of Systems  All other systems reviewed and are negative.  A 10 point review of systems was completed and was negative except for pertinent positives and negatives as mentioned in the history of present illness     Allergies  Methotrexate  Home Medications   Prior to Admission medications   Medication Sig Start Date End Date Taking? Authorizing Provider  Coral Calcium 1000 (390 CA) MG TABS Take 2 capsules by mouth daily.    Yes Historical Provider, MD  diltiazem (CARDIZEM CD) 300 MG 24 hr capsule Take 1 capsule (  300 mg total) by mouth daily. 12/01/13  Yes Dwana Melena, PA-C  furosemide (LASIX) 40 MG tablet Take 40 mg by mouth 2 (two) times daily.  11/10/13  Yes Historical Provider, MD  benzonatate (TESSALON) 200 MG capsule Take 1 capsule (200 mg total) by mouth 3 (three) times daily as needed for cough. 01/31/14   Oretha Milch, MD  Cholecalciferol (VITAMIN D3) 1000 UNITS CAPS Take 1 capsule by mouth.     Historical Provider, MD  digoxin (LANOXIN) 0.125 MG tablet Take 1 tablet (0.125 mg total) by mouth daily. 12/01/13   Dwana Melena, PA-C  gabapentin (NEURONTIN) 600 MG tablet Take 600 mg by mouth at bedtime.     Historical Provider, MD  guaiFENesin (MUCINEX) 600 MG 12 hr tablet Take 600 mg by mouth 2 (two)  times daily. Take 1/2 tablet twice a day    Historical Provider, MD  HYDROcodone-acetaminophen (NORCO/VICODIN) 5-325 MG per tablet Take 1 tablet by mouth every 6 (six) hours. 10/12/13   Historical Provider, MD  leflunomide (ARAVA) 20 MG tablet Take 20 mg by mouth daily. 10/10/13   Historical Provider, MD  Melatonin 5 MG CAPS Take 1 capsule by mouth at bedtime.    Historical Provider, MD  memantine (NAMENDA) 10 MG tablet Take 1 tablet (10 mg total) by mouth daily. 02/12/14   Newt Lukes, MD  Multiple Vitamin (MULTIVITAMIN) tablet Take 1 tablet by mouth daily.      Historical Provider, MD  OMEGA-3 1000 MG CAPS Take 1,000 mg by mouth daily.     Historical Provider, MD  omeprazole (PRILOSEC) 20 MG capsule Take 1 capsule (20 mg total) by mouth 2 (two) times daily. 07/07/13   Newt Lukes, MD  prednisoLONE 5 MG TABS tablet Take 5 mg by mouth 3 (three) times daily.    Historical Provider, MD  rivaroxaban (XARELTO) 20 MG TABS tablet Take 1 tablet (20 mg total) by mouth daily with supper. 12/01/13   Dwana Melena, PA-C   BP 161/97 mmHg  Pulse 76  Temp(Src) 98.4 F (36.9 C) (Oral)  Resp 19  SpO2 97% Physical Exam  Constitutional: He is oriented to person, place, and time. He appears well-developed and well-nourished.  HENT:  Head: Normocephalic and atraumatic.  Mouth/Throat: Oropharynx is clear and moist.  Dry mucous membranes  Eyes: Conjunctivae are normal. Pupils are equal, round, and reactive to light. Right eye exhibits no discharge. Left eye exhibits no discharge. No scleral icterus.  Neck: Normal range of motion. Neck supple.  Cardiovascular: Normal rate.   Irregularly regular  Pulmonary/Chest: Effort normal. No respiratory distress.  Crackles bilaterally in lower lung fields, mild expiratory wheezing in bilateral upper fields.  Abdominal: Soft. There is no tenderness.  Musculoskeletal: He exhibits no tenderness.  Pitting edema 2+ to left proximal tibia. 1+ on right proximal  tibia. No overt erythema or warmth. Compartments soft nontender  Neurological: He is alert and oriented to person, place, and time.  Cranial Nerves II-XII grossly intact  Skin: Skin is warm and dry. No rash noted.  Psychiatric: He has a normal mood and affect.  Nursing note and vitals reviewed.   ED Course  Procedures (including critical care time) Labs Review Labs Reviewed  BASIC METABOLIC PANEL - Abnormal; Notable for the following:    Glucose, Bld 106 (*)    GFR calc non Af Amer 77 (*)    GFR calc Af Amer 90 (*)    All other components within normal limits  CBC WITH DIFFERENTIAL/PLATELET - Abnormal; Notable for the following:    RBC 3.85 (*)    Hemoglobin 12.5 (*)    HCT 38.8 (*)    MCV 100.8 (*)    All other components within normal limits  URINALYSIS, ROUTINE W REFLEX MICROSCOPIC - Abnormal; Notable for the following:    APPearance CLOUDY (*)    Hgb urine dipstick LARGE (*)    Nitrite POSITIVE (*)    Leukocytes, UA MODERATE (*)    All other components within normal limits  TROPONIN I - Abnormal; Notable for the following:    Troponin I 0.12 (*)    All other components within normal limits  URINE MICROSCOPIC-ADD ON - Abnormal; Notable for the following:    Bacteria, UA MANY (*)    All other components within normal limits  TROPONIN I - Abnormal; Notable for the following:    Troponin I 0.13 (*)    All other components within normal limits  I-STAT TROPOININ, ED - Abnormal; Notable for the following:    Troponin i, poc 0.10 (*)    All other components within normal limits    Imaging Review Dg Chest 2 View  02/20/2014   CLINICAL DATA:  Weakness, general malaise, COPD.  EXAM: CHEST  2 VIEW  COMPARISON:  11/15/2013  FINDINGS: There is severe bilateral chronic interstitial lung disease. There is no pleural effusion or pneumothorax. There is no new focal parenchymal opacity. There is stable cardiomegaly. There is no acute osseous abnormality.  IMPRESSION: Severe bilateral  chronic interstitial lung disease. Mild superimposed pneumonitis cannot be excluded.   Electronically Signed   By: Elige Ko   On: 02/20/2014 20:24     EKG Interpretation   Date/Time:  Tuesday February 20 2014 18:52:35 EST Ventricular Rate:  79 PR Interval:    QRS Duration: 93 QT Interval:  386 QTC Calculation: 442 R Axis:   155 Text Interpretation:  Right and left arm electrode reversal,  interpretation assumes no reversal Atrial fibrillation Right axis  deviation Borderline low voltage, extremity leads Anteroseptal infarct,  old Nonspecific T abnormalities, lateral leads No significant change since  last tracing Confirmed by [facility]  MD, CHRISTOPHER [id number]) on 02/20/2014  7:01:43 PM     Meds given in ED:  Medications  cefTRIAXone (ROCEPHIN) 1 g in dextrose 5 % 50 mL IVPB (not administered)    New Prescriptions   No medications on file   Filed Vitals:   02/20/14 1740 02/20/14 1750 02/20/14 2000  BP: 161/97 161/97 135/71  Pulse: 85 76 73  Temp: 98.1 F (36.7 C) 98.4 F (36.9 C)   TempSrc: Oral Oral   Resp: 18 19 18   SpO2: 96% 97% 95%    MDM  Vitals stable - WNL -afebrile Pt resting comfortably in ED.  Patient is essentially here for failure to thrive at home. Patient denies any medical symptoms at this time other than difficulty to secure his medications and take care of himself at home, but was found to have elevated troponins in the ED today. There were no EKG changes, patient remains asymptomatic, no chest pain shortness of breath or cough. Patient was also found to have a potential pneumonitis on chest x-ray. Urinalysis shows evidence of UTI, started empirically on ceftriaxone in the ED.  Discussed patient presentation with attending, Dr. . Decision made to consult hospitalist to have patient admitted for failure to thrive, treatment of UTI, cycling of troponins. Social work has been contacted and will be available after patient's  hospital stay.  Consult  IM, Dr. Beacher May to see in ED. patient admitted  Prior to pt admission, I discussed this case with attending, Dr. Blinda Leatherwood   Final diagnoses:  UTI (lower urinary tract infection)  Impaired mobility and ADLs        Sharlene Motts, PA-C 02/21/14 1152  Gilda Crease, MD 02/24/14 1606

## 2014-02-20 NOTE — ED Notes (Signed)
Per EMS pt comes from an independent living apartment [employer] [address] in [place] for unable to care for himself at home anymore without any help.  Pt has PMH dementia and states unable to take his medications as prescribed properly.  EMS stated that pt's house is very disarrayed. Per EMS pt became upset, aggressive and combative when he found out that [facility] was diverting pt here.  Pt has cardiologist, Dr. [place], and told EMS that need to contact him and he will make everything pt wants happen.

## 2014-02-21 ENCOUNTER — Inpatient Hospital Stay (HOSPITAL_COMMUNITY): Payer: Medicare Other

## 2014-02-21 DIAGNOSIS — I5032 Chronic diastolic (congestive) heart failure: Secondary | ICD-10-CM

## 2014-02-21 DIAGNOSIS — R0600 Dyspnea, unspecified: Secondary | ICD-10-CM

## 2014-02-21 DIAGNOSIS — N390 Urinary tract infection, site not specified: Secondary | ICD-10-CM

## 2014-02-21 DIAGNOSIS — I482 Chronic atrial fibrillation: Secondary | ICD-10-CM

## 2014-02-21 DIAGNOSIS — J9601 Acute respiratory failure with hypoxia: Secondary | ICD-10-CM

## 2014-02-21 LAB — CBC
HCT: 46.2 % (ref 39.0–52.0)
HEMOGLOBIN: 15.5 g/dL (ref 13.0–17.0)
MCH: 33.6 pg (ref 26.0–34.0)
MCHC: 33.5 g/dL (ref 30.0–36.0)
MCV: 100.2 fL — ABNORMAL HIGH (ref 78.0–100.0)
Platelets: 243 10*3/uL (ref 150–400)
RBC: 4.61 MIL/uL (ref 4.22–5.81)
RDW: 15.3 % (ref 11.5–15.5)
WBC: 8.8 10*3/uL (ref 4.0–10.5)

## 2014-02-21 LAB — DIGOXIN LEVEL: Digoxin Level: 0.8 ng/mL (ref 0.8–2.0)

## 2014-02-21 LAB — BRAIN NATRIURETIC PEPTIDE: B NATRIURETIC PEPTIDE 5: 178.7 pg/mL — AB (ref 0.0–100.0)

## 2014-02-21 LAB — PROCALCITONIN: Procalcitonin: <0.1 ng/mL

## 2014-02-21 MED ORDER — FUROSEMIDE 10 MG/ML IJ SOLN
INTRAMUSCULAR | Status: AC
  Administered 2014-02-21: 40 mg via INTRAVENOUS
  Filled 2014-02-21: qty 4

## 2014-02-21 MED ORDER — IPRATROPIUM-ALBUTEROL 0.5-2.5 (3) MG/3ML IN SOLN
3.0000 mL | Freq: Four times a day (QID) | RESPIRATORY_TRACT | Status: DC
  Administered 2014-02-21 – 2014-02-23 (×8): 3 mL via RESPIRATORY_TRACT
  Filled 2014-02-21 (×8): qty 3

## 2014-02-21 MED ORDER — DEXTROSE 5 % IV SOLN
500.0000 mg | INTRAVENOUS | Status: DC
  Administered 2014-02-21 – 2014-02-23 (×3): 500 mg via INTRAVENOUS
  Filled 2014-02-21 (×3): qty 500

## 2014-02-21 MED ORDER — ZOLPIDEM TARTRATE 5 MG PO TABS
5.0000 mg | ORAL_TABLET | Freq: Every evening | ORAL | Status: DC | PRN
  Administered 2014-02-21: 5 mg via ORAL
  Filled 2014-02-21: qty 1

## 2014-02-21 MED ORDER — LORAZEPAM 2 MG/ML IJ SOLN
1.0000 mg | Freq: Once | INTRAMUSCULAR | Status: AC
  Administered 2014-02-21: 1 mg via INTRAVENOUS
  Filled 2014-02-21: qty 1

## 2014-02-21 MED ORDER — METHYLPREDNISOLONE SODIUM SUCC 40 MG IJ SOLR
40.0000 mg | Freq: Three times a day (TID) | INTRAMUSCULAR | Status: DC
  Administered 2014-02-21 – 2014-02-22 (×3): 40 mg via INTRAVENOUS
  Filled 2014-02-21 (×6): qty 1

## 2014-02-21 MED ORDER — ASPIRIN 81 MG PO CHEW
81.0000 mg | CHEWABLE_TABLET | Freq: Every day | ORAL | Status: DC
  Administered 2014-02-21 – 2014-02-22 (×2): 81 mg via ORAL
  Filled 2014-02-21 (×2): qty 1

## 2014-02-21 MED ORDER — FUROSEMIDE 10 MG/ML IJ SOLN
40.0000 mg | Freq: Once | INTRAMUSCULAR | Status: AC
  Administered 2014-02-21: 40 mg via INTRAVENOUS

## 2014-02-21 MED ORDER — ALBUTEROL SULFATE (2.5 MG/3ML) 0.083% IN NEBU
2.5000 mg | INHALATION_SOLUTION | RESPIRATORY_TRACT | Status: DC | PRN
Start: 2014-02-21 — End: 2014-02-23

## 2014-02-21 NOTE — Progress Notes (Addendum)
PROGRESS NOTE  [name] OXB:[id number] DOB: [birth year] DOA: 02/20/2014 PCP: [name], MD  Assessment/Plan: Acute Hypoxemic Respiratory Failure -likely due to COPD exacerbation vs CHF exacerbation -CXR -start IV solumedrol -supplemental oxygen -echo -BNP COPD Exacerbation -add azithromycin to ceftriaxone -IV steroids -start BDs -wheezing on exam UTI/hematuria -continue ceftriaxone pending culture data -hematuria resolved Elevated troponnin -due to demand ischemia -EKG without acute ischemic changes Chronic atrial fibrillation -hold xarelto in setting of hematuria -start ASA for now LE edema -venous duplex r/o DVT Deconditioning -PT eval    Family Communication:   Pt at beside Disposition Plan:  likely SNF      Procedures/Studies: Dg Chest 2 View  02/20/2014   CLINICAL DATA:  Weakness, general malaise, COPD.  EXAM: CHEST  2 VIEW  COMPARISON:  11/15/2013  FINDINGS: There is severe bilateral chronic interstitial lung disease. There is no pleural effusion or pneumothorax. There is no new focal parenchymal opacity. There is stable cardiomegaly. There is no acute osseous abnormality.  IMPRESSION: Severe bilateral chronic interstitial lung disease. Mild superimposed pneumonitis cannot be excluded.   Electronically Signed   By: Elige Ko   On: 02/20/2014 20:24         Subjective: Pt c/o sob with some chest tightness.  Denies f/c, n/v/d, abdominal pain. Hematuria has resolved.  No HA or dizziness  Objective: Filed Vitals:   02/21/14 0110 02/21/14 0423 02/21/14 1029 02/21/14 1246  BP: 157/83 159/78 157/82 158/101  Pulse: 71 99 77 81  Temp: 97.7 F (36.5 C) 98.2 F (36.8 C) 98 F (36.7 C) 97.6 F (36.4 C)  TempSrc: Oral Oral Oral Oral  Resp: 20 20 20 20   Height: 5\' 8"  (1.727 m)     Weight: 91.9 kg (202 lb 9.6 oz)     SpO2: 93% 96% 94% 96%    Intake/Output Summary (Last 24 hours) at 02/21/14 1254 Last data filed at 02/21/14 1000  Gross per 24 hour  Intake      0 ml  Output    950 ml  Net   -950 ml   Weight change:  Exam:   General:  Pt is alert, follows commands appropriately, not in acute distress  HEENT: No icterus, No thrush,  [place]/AT  Cardiovascular: RRR, S1/S2, no rubs, no gallops  Respiratory: bilateral crackles with mild wheeze  Abdomen: Soft/+BS, non tender, non distended, no guarding  Extremities: 2+LE  Edema--L>R, No lymphangitis, No petechiae, No rashes, no synovitis  Data Reviewed: Basic Metabolic Panel:  Recent Labs Lab 02/20/14 1853  NA 135  K 4.1  CL 101  CO2 27  GLUCOSE 106*  BUN 12  CREATININE 0.93  CALCIUM 8.6   Liver Function Tests: No results for input(s): AST, ALT, ALKPHOS, BILITOT, PROT, ALBUMIN in the last 168 hours. No results for input(s): LIPASE, AMYLASE in the last 168 hours. No results for input(s): AMMONIA in the last 168 hours. CBC:  Recent Labs Lab 02/20/14 1853  WBC 8.3  NEUTROABS 5.9  HGB 12.5*  HCT 38.8*  MCV 100.8*  PLT 258   Cardiac Enzymes:  Recent Labs Lab 02/20/14 1922 02/20/14 2115  TROPONINI 0.12* 0.13*   BNP: Invalid input(s): POCBNP CBG: No results for input(s): GLUCAP in the last 168 hours.  No results found for this or any previous visit (from the past 240 hour(s)).   Scheduled Meds: . azithromycin  500 mg Intravenous Q24H  . calcium carbonate  1 tablet Oral Q  breakfast  . cefTRIAXone (ROCEPHIN)  IV  1 g Intravenous Q24H  . digoxin  0.125 mg Oral Daily  . diltiazem  300 mg Oral Daily  . gabapentin  1,200 mg Oral QHS  . guaiFENesin  600 mg Oral BID  . HYDROcodone-acetaminophen  1 tablet Oral Q6H  . leflunomide  20 mg Oral Daily  . LORazepam  1 mg Intravenous Once  . memantine  10 mg Oral Daily  . methylPREDNISolone (SOLU-MEDROL) injection  40 mg Intravenous 3 times per day  . pantoprazole  40 mg Oral Daily  . sodium chloride  3 mL Intravenous Q12H   Continuous Infusions:    Abdalrahman Clementson, DO  [facility] Pager  [phone]  If 7PM-7AM, please contact night-coverage [email or web address] Password [email or web address] 02/21/2014, 12:54 PM   LOS: 1 day

## 2014-02-21 NOTE — Progress Notes (Signed)
UR complete.  Macey Wurtz RN, MSN 

## 2014-02-21 NOTE — Care Management Note (Signed)
    Page 1 of 1   02/23/2014     4:45:07 PM CARE MANAGEMENT NOTE 02/23/2014  Patient:  [name], [name]   Account Number:  [id number]  Date Initiated:  02/21/2014  Documentation initiated by:  Elmer Bales  Subjective/Objective Assessment:   Patient was admitted with UTI, elevated troponin.  Lives alone at [facility] in [place], [place].  Pt reports increasing difficulty in caring for self     Action/Plan:   Will follow for discharge needs   Anticipated DC Date:  02/24/2014   Anticipated DC Plan:  SKILLED NURSING FACILITY  In-house referral  Clinical Social Worker      DC Planning Services  CM consult      Choice offered to / List presented to:             Status of service:  Completed, signed off Medicare Important Message given?  YES (If response is "NO", the following Medicare IM given date fields will be blank) Date Medicare IM given:  02/23/2014 Medicare IM given by:  Willodean Leven Date Additional Medicare IM given:   Additional Medicare IM given by:    Discharge Disposition:    Per UR Regulation:  Reviewed for med. necessity/level of care/duration of stay  If discussed at Long Length of Stay Meetings, dates discussed:    Comments:  02/23/14 Sidney Ace, RN, BSN [phone] Pt discharged to SNF today, per CSW arrangements.  02/22/14 Sidney Ace, RN, BSN [phone] PT recommending SNF at dc; CSW consulted to facilitate poss dc to SNF when medically stable for dc.  Will follow progress.

## 2014-02-21 NOTE — Progress Notes (Signed)
Pt. C/o SOB and chest tightness.SPO2 88% Placed on 3L [place] up to 95%.

## 2014-02-21 NOTE — Progress Notes (Signed)
OT Cancellation Note  Patient Details Name: [name] MRN: [id number] DOB: 07-24-1931   Cancelled Treatment:    Reason Eval/Treat Not Completed: Medical issues which prohibited therapy - Per RN pt requests OT hold off at this time due to increased WOB and increased BP.  Will reattempt tomorrow.   Angelene Giovanni [place], OTR/L [phone]  02/21/2014, 12:59 PM

## 2014-02-21 NOTE — Progress Notes (Signed)
  Echocardiogram 2D Echocardiogram has been performed.  [name] 02/21/2014, 5:02 PM

## 2014-02-21 NOTE — Progress Notes (Signed)
Pt placed on CPAP at 10 CMH20 with 2 LPM O2 bleed in via FFM.  Pt tolerating well at this time, RT to monitor and assess as needed.  

## 2014-02-21 NOTE — Progress Notes (Signed)
PT Cancellation Note  Patient Details Name: KIZER NOBBE MRN: [id number] DOB: [birth year]   Cancelled Treatment:    Reason Eval/Treat Not Completed: Medical issues which prohibited therapy (per OT/Nsg increased WOB at this time) will follow.   Fabio Asa 02/21/2014, 1:03 PM  Charlotte Crumb, PT DPT  [phone]

## 2014-02-22 ENCOUNTER — Encounter (HOSPITAL_COMMUNITY): Payer: Self-pay | Admitting: General Practice

## 2014-02-22 DIAGNOSIS — I5023 Acute on chronic systolic (congestive) heart failure: Secondary | ICD-10-CM

## 2014-02-22 LAB — CBC
HEMATOCRIT: 39.4 % (ref 39.0–52.0)
Hemoglobin: 13.1 g/dL (ref 13.0–17.0)
MCH: 32.5 pg (ref 26.0–34.0)
MCHC: 33.2 g/dL (ref 30.0–36.0)
MCV: 97.8 fL (ref 78.0–100.0)
Platelets: 275 10*3/uL (ref 150–400)
RBC: 4.03 MIL/uL — ABNORMAL LOW (ref 4.22–5.81)
RDW: 15 % (ref 11.5–15.5)
WBC: 11.5 10*3/uL — AB (ref 4.0–10.5)

## 2014-02-22 LAB — BASIC METABOLIC PANEL
Anion gap: 9 (ref 5–15)
BUN: 14 mg/dL (ref 6–23)
CO2: 26 mmol/L (ref 19–32)
Calcium: 8.7 mg/dL (ref 8.4–10.5)
Chloride: 105 mmol/L (ref 96–112)
Creatinine, Ser: 1.01 mg/dL (ref 0.50–1.35)
GFR calc Af Amer: 79 mL/min — ABNORMAL LOW (ref >90)
GFR, EST NON AFRICAN AMERICAN: 68 mL/min — AB (ref >90)
Glucose, Bld: 208 mg/dL — ABNORMAL HIGH (ref 70–99)
POTASSIUM: 3.8 mmol/L (ref 3.5–5.1)
Sodium: 140 mmol/L (ref 135–145)

## 2014-02-22 LAB — TROPONIN I
TROPONIN I: 0.17 ng/mL — AB (ref <0.031)
Troponin I: 0.13 ng/mL — ABNORMAL HIGH (ref <0.031)

## 2014-02-22 MED ORDER — FUROSEMIDE 40 MG PO TABS
40.0000 mg | ORAL_TABLET | Freq: Two times a day (BID) | ORAL | Status: DC
  Administered 2014-02-23: 40 mg via ORAL
  Filled 2014-02-22 (×3): qty 1

## 2014-02-22 MED ORDER — PREDNISONE 20 MG PO TABS
40.0000 mg | ORAL_TABLET | Freq: Every day | ORAL | Status: DC
  Administered 2014-02-23: 40 mg via ORAL
  Filled 2014-02-22 (×2): qty 2

## 2014-02-22 MED ORDER — RIVAROXABAN 20 MG PO TABS
20.0000 mg | ORAL_TABLET | Freq: Every day | ORAL | Status: DC
  Administered 2014-02-22: 20 mg via ORAL
  Filled 2014-02-22 (×2): qty 1

## 2014-02-22 MED ORDER — FUROSEMIDE 10 MG/ML IJ SOLN
40.0000 mg | Freq: Once | INTRAMUSCULAR | Status: AC
  Administered 2014-02-22: 40 mg via INTRAVENOUS
  Filled 2014-02-22: qty 4

## 2014-02-22 MED ORDER — ENSURE COMPLETE PO LIQD
237.0000 mL | Freq: Two times a day (BID) | ORAL | Status: DC
  Administered 2014-02-22 – 2014-02-23 (×4): 237 mL via ORAL

## 2014-02-22 NOTE — Progress Notes (Signed)
Chaplain responded to spiritual care consult. Pt declined chaplain support at this time, saying "I'll call you if I need you."   Arneta Cliche, Chaplain 02/22/2014 9:14 AM

## 2014-02-22 NOTE — Progress Notes (Signed)
Physical Therapy Evaluation Patient Details Name: [name] MRN: [id number] DOB: 1931/09/03 Today's Date: 02/22/2014   History of Present Illness  81 yo male with recent history of confusion and now elevated TPN was admitted for UTI and  limited mobility.  PMHx:  dementia, CHF, COPD, smoker, peripheral neuropathy  Clinical Impression  Pt was seen for initial visit and noted his change from independent function to current situation.  He is planning to go to SNF and will likely need some equipment, but not yet determined.  Will await his arrival in SNF for further equipment planning.    Follow Up Recommendations SNF;Supervision/Assistance - 24 hour    Equipment Recommendations  None recommended by PT    Recommendations for Other Services       Precautions / Restrictions Precautions Precautions: Fall Restrictions Weight Bearing Restrictions: No      Mobility  Bed Mobility Overal bed mobility: Needs Assistance Bed Mobility: Supine to Sit     Supine to sit: Min guard     General bed mobility comments: Pt is having a difficult time with forgetting his bed alarm  Transfers Overall transfer level: Needs assistance Equipment used: Rolling walker (2 wheeled) Transfers: Sit to/from [employer] Sit to Stand: Min guard Stand pivot transfers: Min guard       General transfer comment: reminders for sequence and safety but is aware he could fall  Ambulation/Gait Ambulation/Gait assistance: Min guard Ambulation Distance (Feet): 80 Feet Assistive device: Rolling walker (2 wheeled) Gait Pattern/deviations: Step-through pattern;Trunk flexed;Drifts right/left;Wide base of support Gait velocity: reduced Gait velocity interpretation: Below normal speed for age/gender General Gait Details: Pt is coming close to obstacles and is requiring cues for turn accuracy and to stay inside walker  Stairs            Wheelchair Mobility    Modified Rankin (Stroke  Patients Only)       Balance Overall balance assessment: Needs assistance Sitting-balance support: Feet supported Sitting balance-Leahy Scale: Good   Postural control: Posterior lean Standing balance support: Bilateral upper extremity supported Standing balance-Leahy Scale: Poor                               Pertinent Vitals/Pain Pain Assessment: No/denies pain    Home Living Family/patient expects to be discharged to:: Unsure Living Arrangements: Alone               Additional Comments: Lives at [facility] [employer]    Prior Function Level of Independence: Independent with assistive device(s)         Comments: Pt expresses concern that he will need to go home with someone else as he is not able to live alone yet     Hand Dominance   Dominant Hand: Right    Extremity/Trunk Assessment   Upper Extremity Assessment: Overall WFL for tasks assessed           Lower Extremity Assessment: Generalized weakness      Cervical / Trunk Assessment: Normal  Communication   Communication: No difficulties  Cognition Arousal/Alertness: Awake/alert Behavior During Therapy: WFL for tasks assessed/performed Overall Cognitive Status: Within Functional Limits for tasks assessed                      General Comments General comments (skin integrity, edema, etc.): Pt is having a problem with remembering the events around his admission and cannot understand that some  of his things are not here.  He heardthe room phone and thought it was his cell and even calling the phone didn't convince him.    Exercises General Exercises - Lower Extremity Ankle Circles/Pumps: AROM;Both;5 reps Long Arc Quad: AROM;Both;5 reps Heel Slides: AROM;Both;5 reps Hip ABduction/ADduction: AROM;Both;5 reps      Assessment/Plan    PT Assessment Patient needs continued PT services  PT Diagnosis Generalized weakness   PT Problem List Decreased strength;Decreased  range of motion;Decreased activity tolerance;Decreased balance;Decreased mobility;Decreased coordination;Decreased knowledge of use of DME;Decreased safety awareness;Decreased skin integrity  PT Treatment Interventions DME instruction;Gait training;Functional mobility training;Therapeutic activities;Therapeutic exercise;Neuromuscular re-education;Balance training;Patient/family education;Cognitive remediation   PT Goals (Current goals can be found in the Care Plan section) Acute Rehab PT Goals Patient Stated Goal: To go somewhere safe PT Goal Formulation: With patient Time For Goal Achievement: 03/08/14 Potential to Achieve Goals: Good    Frequency Min 3X/week   Barriers to discharge Inaccessible home environment;Decreased caregiver support Pt is at SNF level and should be admittedfor  rehab before going to next living arrangement    Co-evaluation               End of Session Equipment Utilized During Treatment:  (FWW) Activity Tolerance: Patient tolerated treatment well Patient left: in chair;with call bell/phone within reach Nurse Communication: Mobility status         Time: 2202-5427 PT Time Calculation (min) (ACUTE ONLY): 36 min   Charges:   PT Evaluation $Initial PT Evaluation Tier I: 1 Procedure PT Treatments $Gait Training: 8-22 mins   PT G Codes:        Ivar Drape 03-14-2014, 1:24 PM   Samul Dada, PT MS Acute Rehab Dept. Number: [phone]

## 2014-02-22 NOTE — Progress Notes (Signed)
OT Cancellation Note  Patient Details Name: [name] MRN: [id number] DOB: [birth year]   Cancelled Treatment:    Reason Eval/Treat Not Completed: Other (comment) Pt is Medicare and current D/C plan is SNF. No apparent immediate acute care OT needs, therefore will defer OT to SNF. If OT eval is needed please call Acute Rehab Dept. at [phone] or text page OT at [phone].    Evette Georges [phone] 02/22/2014, 2:44 PM

## 2014-02-22 NOTE — Progress Notes (Signed)
PROGRESS NOTE  [name]:[id number] DOB: [birth year] DOA: 02/20/2014 PCP: [name], MD  Assessment/Plan: Acute Hypoxemic Respiratory Failure -likely due to COPD exacerbation vs CHF exacerbation -CXR-bilateral infiltrates -start IV solumedrol-->wean to po prednisone -supplemental oxygen-->weaned to RA-->stable -echo--EF 40-45%, diffuse HK -BNP--178 Acute on chronic systolic CHF -Continue IV furosemide -Repeat chest x-ray 02/23/2014 -Daily weights -Echocardiogram EF 40-45%, diffuse HK -in part due to noncompliance with meds as pt was not taking any of his meds properly at home -procalcitonin <0.10 COPD Exacerbation -add azithromycin to ceftriaxone -IV steroids-->wean to prednisone -continue BDs -wheezing on exam improved UTI/hematuria -continue ceftriaxone  -Unfortunately, urine culture was not obtained -hematuria resolved Elevated troponnin -due to demand ischemia -EKG without acute ischemic changes Chronic atrial fibrillation -restart Xarelto as Hgb is stable and no further hematuria LE edema -venous duplex r/o DVT Deconditioning -PT eval  Family Communication:   Pt at beside Disposition Plan:   SNF       Procedures/Studies: Dg Chest 2 View  02/20/2014   CLINICAL DATA:  Weakness, general malaise, COPD.  EXAM: CHEST  2 VIEW  COMPARISON:  11/15/2013  FINDINGS: There is severe bilateral chronic interstitial lung disease. There is no pleural effusion or pneumothorax. There is no new focal parenchymal opacity. There is stable cardiomegaly. There is no acute osseous abnormality.  IMPRESSION: Severe bilateral chronic interstitial lung disease. Mild superimposed pneumonitis cannot be excluded.   Electronically Signed   By: Elige Ko   On: 02/20/2014 20:24   Dg Chest Port 1 View  02/21/2014   CLINICAL DATA:  Dyspnea.  Interstitial pneumonitis.  EXAM: PORTABLE CHEST - 1 VIEW  COMPARISON:  02/20/2014 and 11/15/2013 and CT scan dated 08/31/2013   FINDINGS: The patient has developed diffuse bilateral pulmonary infiltrates superimposed on chronic interstitial lung disease. The areas chronic cardiomegaly. There is slight pulmonary vascular prominence.  IMPRESSION: New diffuse bilateral infiltrates superimposed on chronic interstitial lung disease. This could represent pulmonary edema.   Electronically Signed   By: Geanie Cooley M.D.   On: 02/21/2014 13:34         Subjective: Patient is breathing better. Denies any fevers, chills, chest breath, nausea, vomiting, diarrhea, abdominal pain. Denies any dysuria or hematuria.  Objective: Filed Vitals:   02/21/14 2100 02/22/14 0233 02/22/14 0517 02/22/14 0835  BP: 125/86  120/59 148/98  Pulse: 71  75 91  Temp: 97.6 F (36.4 C)  97.8 F (36.6 C) 97.5 F (36.4 C)  TempSrc: Oral  Oral Oral  Resp: 18  19 18   Height:      Weight:   90.1 kg (198 lb 10.2 oz)   SpO2: 96% 96% 96% 96%    Intake/Output Summary (Last 24 hours) at 02/22/14 1100 Last data filed at 02/22/14 1001  Gross per 24 hour  Intake    770 ml  Output    950 ml  Net   -180 ml   Weight change: -1.8 kg (-3 lb 15.5 oz) Exam:   General:  Pt is alert, follows commands appropriately, not in acute distress  HEENT: No icterus, No thrush, No neck mass, [place]/AT  Cardiovascular: RRR, S1/S2, no rubs, no gallops  Respiratory: CTA bilaterally, no wheezing, no crackles, no rhonchi  Abdomen: Soft/+BS, non tender, non distended, no guarding  Extremities: No edema, No lymphangitis, No petechiae, No rashes, no synovitis  Data Reviewed: Basic Metabolic Panel:  Recent Labs Lab 02/20/14 1853  NA 135  K 4.1  CL 101  CO2 27  GLUCOSE 106*  BUN 12  CREATININE 0.93  CALCIUM 8.6   Liver Function Tests: No results for input(s): AST, ALT, ALKPHOS, BILITOT, PROT, ALBUMIN in the last 168 hours. No results for input(s): LIPASE, AMYLASE in the last 168 hours. No results for input(s): AMMONIA in the last 168 hours. CBC:  Recent  Labs Lab 02/20/14 1853 02/21/14 1405  WBC 8.3 8.8  NEUTROABS 5.9  --   HGB 12.5* 15.5  HCT 38.8* 46.2  MCV 100.8* 100.2*  PLT 258 243   Cardiac Enzymes:  Recent Labs Lab 02/20/14 1922 02/20/14 2115  TROPONINI 0.12* 0.13*   BNP: Invalid input(s): POCBNP CBG: No results for input(s): GLUCAP in the last 168 hours.  No results found for this or any previous visit (from the past 240 hour(s)).   Scheduled Meds: . aspirin  81 mg Oral Daily  . azithromycin  500 mg Intravenous Q24H  . calcium carbonate  1 tablet Oral Q breakfast  . cefTRIAXone (ROCEPHIN)  IV  1 g Intravenous Q24H  . digoxin  0.125 mg Oral Daily  . diltiazem  300 mg Oral Daily  . feeding supplement (ENSURE COMPLETE)  237 mL Oral BID BM  . gabapentin  1,200 mg Oral QHS  . guaiFENesin  600 mg Oral BID  . HYDROcodone-acetaminophen  1 tablet Oral Q6H  . ipratropium-albuterol  3 mL Nebulization Q6H  . leflunomide  20 mg Oral Daily  . memantine  10 mg Oral Daily  . methylPREDNISolone (SOLU-MEDROL) injection  40 mg Intravenous 3 times per day  . pantoprazole  40 mg Oral Daily  . sodium chloride  3 mL Intravenous Q12H   Continuous Infusions:    Shoaib Siefker, DO  [facility] Pager [phone]  If 7PM-7AM, please contact night-coverage [email or web address] Password [email or web address] 02/22/2014, 11:00 AM   LOS: 2 days

## 2014-02-23 ENCOUNTER — Inpatient Hospital Stay (HOSPITAL_COMMUNITY): Payer: Medicare Other

## 2014-02-23 DIAGNOSIS — B999 Unspecified infectious disease: Secondary | ICD-10-CM | POA: Diagnosis not present

## 2014-02-23 DIAGNOSIS — I482 Chronic atrial fibrillation: Secondary | ICD-10-CM | POA: Diagnosis not present

## 2014-02-23 DIAGNOSIS — G4700 Insomnia, unspecified: Secondary | ICD-10-CM | POA: Diagnosis not present

## 2014-02-23 DIAGNOSIS — R2681 Unsteadiness on feet: Secondary | ICD-10-CM | POA: Diagnosis not present

## 2014-02-23 DIAGNOSIS — R488 Other symbolic dysfunctions: Secondary | ICD-10-CM | POA: Diagnosis not present

## 2014-02-23 DIAGNOSIS — H8110 Benign paroxysmal vertigo, unspecified ear: Secondary | ICD-10-CM | POA: Diagnosis not present

## 2014-02-23 DIAGNOSIS — I10 Essential (primary) hypertension: Secondary | ICD-10-CM | POA: Diagnosis not present

## 2014-02-23 DIAGNOSIS — J441 Chronic obstructive pulmonary disease with (acute) exacerbation: Secondary | ICD-10-CM | POA: Diagnosis not present

## 2014-02-23 DIAGNOSIS — I4891 Unspecified atrial fibrillation: Secondary | ICD-10-CM | POA: Diagnosis not present

## 2014-02-23 DIAGNOSIS — J449 Chronic obstructive pulmonary disease, unspecified: Secondary | ICD-10-CM | POA: Diagnosis not present

## 2014-02-23 DIAGNOSIS — R609 Edema, unspecified: Secondary | ICD-10-CM

## 2014-02-23 DIAGNOSIS — M6281 Muscle weakness (generalized): Secondary | ICD-10-CM | POA: Diagnosis not present

## 2014-02-23 DIAGNOSIS — J8410 Pulmonary fibrosis, unspecified: Secondary | ICD-10-CM | POA: Diagnosis not present

## 2014-02-23 DIAGNOSIS — M069 Rheumatoid arthritis, unspecified: Secondary | ICD-10-CM | POA: Diagnosis not present

## 2014-02-23 DIAGNOSIS — I517 Cardiomegaly: Secondary | ICD-10-CM | POA: Diagnosis not present

## 2014-02-23 DIAGNOSIS — I509 Heart failure, unspecified: Secondary | ICD-10-CM | POA: Diagnosis not present

## 2014-02-23 DIAGNOSIS — J9601 Acute respiratory failure with hypoxia: Secondary | ICD-10-CM | POA: Diagnosis not present

## 2014-02-23 DIAGNOSIS — I5020 Unspecified systolic (congestive) heart failure: Secondary | ICD-10-CM | POA: Diagnosis not present

## 2014-02-23 DIAGNOSIS — K219 Gastro-esophageal reflux disease without esophagitis: Secondary | ICD-10-CM | POA: Diagnosis not present

## 2014-02-23 DIAGNOSIS — N390 Urinary tract infection, site not specified: Secondary | ICD-10-CM | POA: Diagnosis not present

## 2014-02-23 DIAGNOSIS — I5023 Acute on chronic systolic (congestive) heart failure: Secondary | ICD-10-CM | POA: Diagnosis not present

## 2014-02-23 DIAGNOSIS — I5032 Chronic diastolic (congestive) heart failure: Secondary | ICD-10-CM | POA: Diagnosis not present

## 2014-02-23 DIAGNOSIS — M2550 Pain in unspecified joint: Secondary | ICD-10-CM | POA: Diagnosis not present

## 2014-02-23 DIAGNOSIS — F329 Major depressive disorder, single episode, unspecified: Secondary | ICD-10-CM | POA: Diagnosis not present

## 2014-02-23 DIAGNOSIS — F0390 Unspecified dementia without behavioral disturbance: Secondary | ICD-10-CM | POA: Diagnosis not present

## 2014-02-23 LAB — BASIC METABOLIC PANEL
Anion gap: 8 (ref 5–15)
BUN: 21 mg/dL (ref 6–23)
CHLORIDE: 104 mmol/L (ref 96–112)
CO2: 27 mmol/L (ref 19–32)
Calcium: 8.4 mg/dL (ref 8.4–10.5)
Creatinine, Ser: 1.06 mg/dL (ref 0.50–1.35)
GFR calc Af Amer: 74 mL/min — ABNORMAL LOW (ref >90)
GFR calc non Af Amer: 64 mL/min — ABNORMAL LOW (ref >90)
GLUCOSE: 173 mg/dL — AB (ref 70–99)
Potassium: 4 mmol/L (ref 3.5–5.1)
Sodium: 139 mmol/L (ref 135–145)

## 2014-02-23 LAB — PROCALCITONIN: Procalcitonin: <0.1 ng/mL

## 2014-02-23 MED ORDER — HYDROCODONE-ACETAMINOPHEN 5-325 MG PO TABS: 1.0000 | ORAL_TABLET | Freq: Four times a day (QID) | ORAL | Status: DC

## 2014-02-23 MED ORDER — ENSURE COMPLETE PO LIQD: 237.0000 mL | Freq: Two times a day (BID) | ORAL | Status: DC

## 2014-02-23 MED ORDER — LEVOFLOXACIN 500 MG PO TABS: 500.0000 mg | ORAL_TABLET | Freq: Every day | ORAL | Status: DC

## 2014-02-23 MED ORDER — PREDNISONE 10 MG PO TABS: 10.0000 mg | ORAL_TABLET | Freq: Every day | ORAL | Status: DC

## 2014-02-23 NOTE — Clinical Social Work Psychosocial (Signed)
     Clinical Social Work Department BRIEF PSYCHOSOCIAL ASSESSMENT 02/23/2014  Patient:  [name], [name]     Account Number:  [id number]     Admit date:  02/20/2014  Clinical Social Worker:  Harless Nakayama  Date/Time:  02/23/2014 12:31 PM  Referred by:  Physician  Date Referred:  02/23/2014 Referred for  SNF Placement   Other Referral:   Interview type:  Patient Other interview type:   Spoke with pt and later with pt sister Corrie Dandy on the phone    PSYCHOSOCIAL DATA Living Status:  ALONE Admitted from facility:   Level of care:   Primary support name:  Sol Blazing Primary support relationship to patient:  SIBLING Degree of support available:   Pt has good family support    CURRENT CONCERNS Current Concerns  Post-Acute Placement   Other Concerns:    SOCIAL WORK ASSESSMENT / PLAN CSW visited pt room to discuss SNF. Pt able to comprehend conversation and answer CSW questions. However, pt very preoccupied on whether or not Dr. Swaziland was aware that he was admitted to the hospital. Even after CSW and nursing staff informed pt that we would check on this pt continued to speak about Dr. Swaziland being notified. CSW was able to notify pt of recommendation and pt informed CSW he was agreeable to SNF. Pt stated that he has a preference for [facility] and his sister was working on helping him get to the facility. CSW explained that CSW would now assist with this. CSW asked for permission to speak with pt sister. Pt was agreeable and asked that CSW keep himself and his sister updated.  CSW spoke with pt sister [place] over the phone. She informed CSW that they were pleased with care that family had received at [facility] but the location is not convenient. [place] wanting to know if CSW could recommend similar facility closer to her home. CSW explained that CSW cannot make this recommendation. Pt sister informed CSW she lives very close to [facility] and may want pt to dc there instead. She plans to visit the facility within the hour and then call CSW with decision. CSW informed [place] that CSW will send referral to all [facility] SNFs so that all options are available depending on decision.   Assessment/plan status:  Psychosocial Support/Ongoing Assessment of Needs Other assessment/ plan:   Information/referral to community resources:   SNF list to be provided with bed offers    PATIENTS/FAMILYS RESPONSE TO PLAN OF CARE: Pt distracted during conversation and unable to hold continuous conversation with CSW. However, both pt and pt sister were clear in their agreement towards SNF recommendation.      Abrar Koone, LCSWA  [phone]

## 2014-02-23 NOTE — Progress Notes (Signed)
Pt had multiple beat run VT.  Upon checking on pt, pt was receiving a breathing treatment with no complaints.  MD paged.

## 2014-02-23 NOTE — Clinical Social Work Placement (Addendum)
    Clinical Social Work Department CLINICAL SOCIAL WORK PLACEMENT NOTE 02/23/2014  Patient:  [name], [name]  Account Number:  [id number] Admit date:  02/20/2014  Clinical Social Worker:  Sharol Harness, Theresia Majors  Date/time:  02/23/2014 12:37 PM  Clinical Social Work is seeking post-discharge placement for this patient at the following level of care:   SKILLED NURSING   (*CSW will update this form in Epic as items are completed)   02/23/2014  Patient/family provided with [facility] Department of Clinical Social Work's list of facilities offering this level of care within the geographic area requested by the patient (or if unable, by the patient's family).  02/23/2014  Patient/family informed of their freedom to choose among providers that offer the needed level of care, that participate in Medicare, Medicaid or managed care program needed by the patient, have an available bed and are willing to accept the patient.  02/23/2014  Patient/family informed of [facility]' ownership interest in [facility], as well as of the fact that they are under no obligation to receive care at this facility.  PASARR submitted to EDS on Existing PASARR number received on   FL2 transmitted to all facilities in geographic area requested by pt/family on  02/23/2014 FL2 transmitted to all facilities within larger geographic area on   Patient informed that his/her managed care company has contracts with or will negotiate with  certain facilities, including the following:     Patient/family informed of bed offers received:  02/23/2014 Patient chooses bed at [facility] health care Physician recommends and patient chooses bed at    Patient to be transferred to [facility] health care on  02/23/2014 Patient to be transferred to facility by [facility] Patient and family notified of transfer on 02/23/2014 Name of family member notified:  Dian Queen  The following physician request were entered in  Epic: Physician Request  Please sign FL2.    Additional CommentsSharol Harness, LCSWA [phone]

## 2014-02-23 NOTE — Progress Notes (Signed)
CSW (Clinical Child psychotherapist) prepared pt dc packet and placed with shadow chart. CSW arranged non-emergent ambulance transport for 4pm as requested by pt nurse. Pt, pt family, pt nurse, and facility informed. CSW signing off.  Loretta Doutt, LCSWA [phone]

## 2014-02-23 NOTE — Discharge Summary (Signed)
Physician Discharge Summary  ORAS SPLITT XIP:[id number] DOB: 30-Sep-1931 DOA: 02/20/2014  PCP: [name], MD  Admit date: 02/20/2014 Discharge date: 02/23/2014  Recommendations for Outpatient Follow-up:  1. Pt will need to follow up with PCP in 2 weeks post discharge 2. Please obtain BMP in one week 3. Follow up with Dr. [name] 2 weeks   Discharge Diagnoses:  Acute Hypoxemic Respiratory Failure -likely due to COPD exacerbation vs CHF exacerbation -CXR-bilateral infiltrates--represented CHF -02/23/14 repeat CXR shows clearing of infiltrates -start IV solumedrol-->wean to po prednisone -supplemental oxygen-->weaned to RA-->stable -echo--EF 40-45%, diffuse HK -BNP--178 Acute on chronic systolic CHF -Continue IV furosemide--good clinical response -Repeat chest x-ray 02/23/2014 shows improvement in his patchy infiltrates -Daily weights--the patient's dry weight is 199-200 pounds -The patient's serum creatinine stabilized -The patient will be discharged on furosemide 40 mg by mouth twice a day -Compliance with his medications was discussed with the patient -Echocardiogram EF 40-45%, diffuse HK -in part due to noncompliance with meds as pt was not taking any of his meds properly at home -procalcitonin <0.10 COPD Exacerbation -add azithromycin to ceftriaxone -IV steroids-->wean to prednisone -continue BDs -wheezing on exam improved -The patient will be discharged with levofloxacin for 4 additional days which will complete 7 days of therapy UTI/hematuria -continue ceftriaxone during hospitalization -Unfortunately, urine culture was not obtained -hematuria resolved -As discussed, the patient will be discharged with levofloxacin  Elevated troponnin -due to demand ischemia -EKG without acute ischemic changes -Patient is without chest pain  Chronic atrial fibrillation -restart Xarelto as Hgb is stable and no further hematuria -RN has not noted any hematuria througout  the hospitalization -Remained rate controlled on diltiazem and digoxin- -digoxin level 0.8 LE edema -venous duplex r/o DVT--negative Pulmonary fibrosis -The patient is clinically stable -He would be discharged with prednisone taper back to his baseline dose of 10 mg daily -Follow up with pulmonology, Dr. [name] Deconditioning -PT eval-->SNF  Family Communication: updated sister Gari Crown 2/5 Disposition Plan: SNF  Discharge Condition: Stable  Disposition:  Follow-up Information    Follow up with [name], MD In 2 weeks.   Specialty:  Cardiology   Contact information:   [address] [place] [address] [phone]     Skilled nursing facility  Diet:cardiac Wt Readings from Last 3 Encounters:  02/23/14 90.266 kg (199 lb)  01/31/14 92.806 kg (204 lb 9.6 oz)  01/29/14 93.949 kg (207 lb 1.9 oz)    History of present illness:  81 y.o. male with history of dementia, A.Fib, CHF, COPD, presents to ED for evaluation of "not being able to care for myself". Patient lives alone in [place] appointments in [place] and has realized he is unable to care for himself anymore. Unable to fill scripts because he forgets about them, does not remember which meds he has taken and which he has not.  After admission, the patient developed worsening shortness of breath and heart failure. He was found to be in pulmonary edema. This was it should be due to the patient's poor compliance with his furosemide which he freely admits. The patient was started on intravenous furosemide with good clinical response. He was weaned off of oxygen and remained stable on room air. He was transitioned to oral furosemide. The patient will need to follow-up with his cardiologist, Dr. [name]. He will be discharged with his previous dose of 40 mg by mouth twice a day. Compliance was discussed with the patient and his family. The patient was evaluated  by physical therapy who recommended  skilled nursing facility. With the assistance of social work, the patient will be transitioned to a skilled nursing facility. However, I spoke at length with the patient's sister explained to her that I have serious concerns that he will be able to return back to independent living.     Discharge Exam: Filed Vitals:   02/23/14 1052  BP: 115/81  Pulse: 97  Temp:   Resp:    Filed Vitals:   02/22/14 2026 02/23/14 0530 02/23/14 0741 02/23/14 1052  BP: 102/60 125/72  115/81  Pulse: 81 84  97  Temp: 97.9 F (36.6 C) 98 F (36.7 C)    TempSrc: Oral Oral    Resp: 18 16    Height:      Weight:  90.266 kg (199 lb)    SpO2: 94% 96% 95%    General: A&O x 3, NAD, pleasant, cooperative Cardiovascular: RRR, no rub, no gallop, no S3 Respiratory: CTAB, no wheeze, no rhonchi Abdomen:soft, nontender, nondistended, positive bowel sounds Extremities: No edema, No lymphangitis, no petechiae  Discharge Instructions      Discharge Instructions    Diet - low sodium heart healthy    Complete by:  As directed      Increase activity slowly    Complete by:  As directed             Medication List    STOP taking these medications        guaiFENesin 600 MG 12 hr tablet  Commonly known as:  MUCINEX     prednisoLONE 5 MG Tabs tablet      TAKE these medications        benzonatate 200 MG capsule  Commonly known as:  TESSALON  Take 1 capsule (200 mg total) by mouth 3 (three) times daily as needed for cough.     Coral Calcium 1000 (390 CA) MG Tabs  Take 2 capsules by mouth daily.     digoxin 0.125 MG tablet  Commonly known as:  LANOXIN  Take 1 tablet (0.125 mg total) by mouth daily.     diltiazem 300 MG 24 hr capsule  Commonly known as:  CARDIZEM CD  Take 1 capsule (300 mg total) by mouth daily.     feeding supplement (ENSURE COMPLETE) Liqd  Take 237 mLs by mouth 2 (two) times daily between meals.     furosemide 40 MG tablet  Commonly known as:  LASIX  Take 40 mg by mouth 2  (two) times daily.     gabapentin 600 MG tablet  Commonly known as:  NEURONTIN  Take 1,200 mg by mouth at bedtime.     HYDROcodone-acetaminophen 5-325 MG per tablet  Commonly known as:  NORCO/VICODIN  Take 1 tablet by mouth every 6 (six) hours.     HYDROcodone-acetaminophen 5-325 MG per tablet  Commonly known as:  NORCO/VICODIN  Take 1 tablet by mouth every 6 (six) hours.     leflunomide 20 MG tablet  Commonly known as:  ARAVA  Take 20 mg by mouth daily.     levofloxacin 500 MG tablet  Commonly known as:  LEVAQUIN  Take 1 tablet (500 mg total) by mouth daily.     Melatonin 5 MG Caps  Take 1 capsule by mouth at bedtime.     memantine 10 MG tablet  Commonly known as:  NAMENDA  Take 1 tablet (10 mg total) by mouth daily.     multivitamin tablet  Take 1 tablet  by mouth daily.     Omega-3 1000 MG Caps  Take 1,000 mg by mouth daily.     omeprazole 20 MG capsule  Commonly known as:  PRILOSEC  Take 1 capsule (20 mg total) by mouth 2 (two) times daily.     predniSONE 10 MG tablet  Commonly known as:  DELTASONE  Take 1 tablet (10 mg total) by mouth daily with breakfast.     rivaroxaban 20 MG Tabs tablet  Commonly known as:  XARELTO  Take 1 tablet (20 mg total) by mouth daily with supper.     Vitamin D3 1000 UNITS Caps  Take 1 capsule by mouth daily.         The results of significant diagnostics from this hospitalization (including imaging, microbiology, ancillary and laboratory) are listed below for reference.    Significant Diagnostic Studies: Dg Chest 2 View  02/20/2014   CLINICAL DATA:  Weakness, general malaise, COPD.  EXAM: CHEST  2 VIEW  COMPARISON:  11/15/2013  FINDINGS: There is severe bilateral chronic interstitial lung disease. There is no pleural effusion or pneumothorax. There is no new focal parenchymal opacity. There is stable cardiomegaly. There is no acute osseous abnormality.  IMPRESSION: Severe bilateral chronic interstitial lung disease. Mild  superimposed pneumonitis cannot be excluded.   Electronically Signed   By: Elige Ko   On: 02/20/2014 20:24   Dg Chest Port 1 View  02/23/2014   CLINICAL DATA:  Congestive heart failure  EXAM: PORTABLE CHEST - 1 VIEW  COMPARISON:  February 21, 2014 and Jun 14, 2013  FINDINGS: There is underlying chronic interstitial fibrotic type change. The superimposed patchy alveolar opacities noted 2 days prior have nearly completely cleared. There is no new opacity on either side. Heart is enlarged with evidence of pulmonary venous hypertension. No adenopathy.  IMPRESSION: Most of the patchy alveolar edema noted 2 days prior has cleared. Most of the opacity present currently is due to chronic interstitial fibrosis, although mild a superimposed edema is felt to remain. No new opacity. Stable cardiomegaly. Pulmonary venous hypertension remains as well.   Electronically Signed   By: Bretta Bang M.D.   On: 02/23/2014 07:17   Dg Chest Port 1 View  02/21/2014   CLINICAL DATA:  Dyspnea.  Interstitial pneumonitis.  EXAM: PORTABLE CHEST - 1 VIEW  COMPARISON:  02/20/2014 and 11/15/2013 and CT scan dated 08/31/2013  FINDINGS: The patient has developed diffuse bilateral pulmonary infiltrates superimposed on chronic interstitial lung disease. The areas chronic cardiomegaly. There is slight pulmonary vascular prominence.  IMPRESSION: New diffuse bilateral infiltrates superimposed on chronic interstitial lung disease. This could represent pulmonary edema.   Electronically Signed   By: Geanie Cooley M.D.   On: 02/21/2014 13:34     Microbiology: No results found for this or any previous visit (from the past 240 hour(s)).   Labs: Basic Metabolic Panel:  Recent Labs Lab 02/20/14 1853 02/22/14 1102 02/23/14 0428  NA 135 140 139  K 4.1 3.8 4.0  CL 101 105 104  CO2 27 26 27   GLUCOSE 106* 208* 173*  BUN 12 14 21   CREATININE 0.93 1.01 1.06  CALCIUM 8.6 8.7 8.4   Liver Function Tests: No results for input(s): AST,  ALT, ALKPHOS, BILITOT, PROT, ALBUMIN in the last 168 hours. No results for input(s): LIPASE, AMYLASE in the last 168 hours. No results for input(s): AMMONIA in the last 168 hours. CBC:  Recent Labs Lab 02/20/14 1853 02/21/14 1405 02/22/14 1102  WBC 8.3  8.8 11.5*  NEUTROABS 5.9  --   --   HGB 12.5* 15.5 13.1  HCT 38.8* 46.2 39.4  MCV 100.8* 100.2* 97.8  PLT 258 243 275   Cardiac Enzymes:  Recent Labs Lab 02/20/14 1922 02/20/14 2115 02/22/14 1102 02/22/14 1706  TROPONINI 0.12* 0.13* 0.13* 0.17*   BNP: Invalid input(s): POCBNP CBG: No results for input(s): GLUCAP in the last 168 hours.  Time coordinating discharge:  Greater than 30 minutes  Signed:  Davionna Blacksher, DO [facility] Pager: [phone] 02/23/2014, 12:56 PM

## 2014-02-23 NOTE — Progress Notes (Addendum)
*  PRELIMINARY RESULTS* Vascular Ultrasound Lower extremity venous duplex has been completed.  Preliminary findings: no evidence of DVT bilaterally. Chronic Superficial thrombosis is noted in the left GSV at the ankle.  Left baker's cyst noted.   Farrel Demark, RDMS, RVT  02/23/2014, 10:23 AM

## 2014-02-23 NOTE — Progress Notes (Signed)
Nutrition Brief Note  Patient identified on the Malnutrition Screening Tool (MST) Report  Wt Readings from Last 15 Encounters:  02/23/14 199 lb (90.266 kg)  01/31/14 204 lb 9.6 oz (92.806 kg)  01/29/14 207 lb 1.9 oz (93.949 kg)  01/29/14 207 lb 12.8 oz (94.257 kg)  12/26/13 213 lb (96.616 kg)  11/20/13 202 lb (91.627 kg)  10/02/13 218 lb 4 oz (98.998 kg)  09/15/13 216 lb (97.977 kg)  09/15/13 215 lb 9.6 oz (97.796 kg)  08/25/13 214 lb 12.8 oz (97.433 kg)  08/16/13 216 lb 8 oz (98.204 kg)  06/20/13 223 lb 12.8 oz (101.515 kg)  06/17/13 227 lb (102.967 kg)  06/14/13 224 lb (101.606 kg)  06/07/13 216 lb (97.977 kg)    Body mass index is 30.26 kg/(m^2). Patient meets criteria for Obesity based on current BMI.   Current diet order is Heart Healthy, patient is consuming approximately 100% of meals at this time. Labs and medications reviewed.   No nutrition interventions warranted at this time. If nutrition issues arise, please consult RD.   Ian Malkin RD, LDN Inpatient Clinical Dietitian Pager: [phone] After Hours Pager: [phone]

## 2014-03-01 DIAGNOSIS — I509 Heart failure, unspecified: Secondary | ICD-10-CM | POA: Diagnosis not present

## 2014-03-01 DIAGNOSIS — I4891 Unspecified atrial fibrillation: Secondary | ICD-10-CM | POA: Diagnosis not present

## 2014-03-01 DIAGNOSIS — F329 Major depressive disorder, single episode, unspecified: Secondary | ICD-10-CM | POA: Diagnosis not present

## 2014-03-01 DIAGNOSIS — J449 Chronic obstructive pulmonary disease, unspecified: Secondary | ICD-10-CM | POA: Diagnosis not present

## 2014-03-09 ENCOUNTER — Ambulatory Visit: Payer: Self-pay | Admitting: Cardiology

## 2014-03-13 DIAGNOSIS — J449 Chronic obstructive pulmonary disease, unspecified: Secondary | ICD-10-CM | POA: Diagnosis not present

## 2014-03-13 DIAGNOSIS — I482 Chronic atrial fibrillation: Secondary | ICD-10-CM | POA: Diagnosis not present

## 2014-03-13 DIAGNOSIS — I509 Heart failure, unspecified: Secondary | ICD-10-CM | POA: Diagnosis not present

## 2014-03-13 DIAGNOSIS — K219 Gastro-esophageal reflux disease without esophagitis: Secondary | ICD-10-CM | POA: Diagnosis not present

## 2014-03-14 ENCOUNTER — Telehealth: Payer: Self-pay | Admitting: Internal Medicine

## 2014-03-14 ENCOUNTER — Encounter: Payer: Self-pay | Admitting: Cardiology

## 2014-03-14 NOTE — Telephone Encounter (Signed)
Requesting order for oxygen.  Patient is new to this facility and had oxygen delivered but this was not included on FL2.  Fax number is [phone].  States patient has edema on both legs and black grayish rash on both feet with bumps.

## 2014-03-14 NOTE — Telephone Encounter (Signed)
Yes on [place] O2  2LPM Exam changes noted - no new change from me thanks

## 2014-03-14 NOTE — Telephone Encounter (Signed)
Please see if he qualifies for O2- O2 saturation less than 88% either at rest or with walking If so, oxygen can be prescribed at 2 L/m-ensure that he maintain saturation about 88% at this level

## 2014-03-15 ENCOUNTER — Telehealth: Payer: Self-pay | Admitting: *Deleted

## 2014-03-15 DIAGNOSIS — F0390 Unspecified dementia without behavioral disturbance: Secondary | ICD-10-CM | POA: Diagnosis not present

## 2014-03-15 DIAGNOSIS — J8410 Pulmonary fibrosis, unspecified: Secondary | ICD-10-CM | POA: Diagnosis not present

## 2014-03-15 DIAGNOSIS — I4891 Unspecified atrial fibrillation: Secondary | ICD-10-CM | POA: Diagnosis not present

## 2014-03-15 DIAGNOSIS — J449 Chronic obstructive pulmonary disease, unspecified: Secondary | ICD-10-CM | POA: Diagnosis not present

## 2014-03-15 DIAGNOSIS — Z7901 Long term (current) use of anticoagulants: Secondary | ICD-10-CM | POA: Diagnosis not present

## 2014-03-15 DIAGNOSIS — M069 Rheumatoid arthritis, unspecified: Secondary | ICD-10-CM | POA: Diagnosis not present

## 2014-03-15 DIAGNOSIS — Z9981 Dependence on supplemental oxygen: Secondary | ICD-10-CM | POA: Diagnosis not present

## 2014-03-15 DIAGNOSIS — F329 Major depressive disorder, single episode, unspecified: Secondary | ICD-10-CM | POA: Diagnosis not present

## 2014-03-15 NOTE — Telephone Encounter (Signed)
Left MSG on triage stating pt is new to the assistant living facility. His BP has been elevated earlier this am BP was 167/108, recheck this after noon it was 144/112. Pt is only taking cardizem for BP. Requesting md recommendations...Raechel Chute

## 2014-03-15 NOTE — Telephone Encounter (Signed)
Message sent to [facility].  We can schedule patient to see TP in Dr. Reginia Naas absence if she thinks patient should be seen here.  Awaiting response.

## 2014-03-15 NOTE — Telephone Encounter (Signed)
Per Dr. [name]:   Please see if he qualifies for O2- O2 saturation less than 88% either at rest or with walking If so, oxygen can be prescribed at 2 L/m-ensure that he maintain saturation about 88% at this level  To [facility] for follow up.  Stephanie:  Do you do O2 Saturation testing there or do you need me to schedule patient to come in here Let me know if there is anything you need me to do.  Thanks.

## 2014-03-15 NOTE — Telephone Encounter (Signed)
Patient needs to be scheduled with TP for O2 Sat qualifications.  Attempted to call patient, busy x 3.  Will try back later.

## 2014-03-15 NOTE — Telephone Encounter (Signed)
Would need to verify dose of ALL medications on current MAR - please have facility fax or send copy of same for med adjustments. thanks

## 2014-03-15 NOTE — Telephone Encounter (Signed)
The O2 sat that we do is with the pulse ox. He has dx of IPF with Pulomonary eval.  All of the O2 sat the pt had read over the last few years have been above 93%. His LOV with Pulmonary for IPF was 01/31/2014; Acute visit at [facility] with [facility] on 01/29/2014.

## 2014-03-16 ENCOUNTER — Encounter (HOSPITAL_COMMUNITY): Payer: Self-pay

## 2014-03-16 ENCOUNTER — Emergency Department (HOSPITAL_COMMUNITY)
Admission: EM | Admit: 2014-03-16 | Discharge: 2014-03-16 | Disposition: A | Discharge status: Home / Self Care | Payer: Medicare Other | Attending: Emergency Medicine | Admitting: Emergency Medicine

## 2014-03-16 DIAGNOSIS — F329 Major depressive disorder, single episode, unspecified: Secondary | ICD-10-CM | POA: Insufficient documentation

## 2014-03-16 DIAGNOSIS — I5032 Chronic diastolic (congestive) heart failure: Secondary | ICD-10-CM | POA: Insufficient documentation

## 2014-03-16 DIAGNOSIS — Z792 Long term (current) use of antibiotics: Secondary | ICD-10-CM | POA: Diagnosis not present

## 2014-03-16 DIAGNOSIS — Z7952 Long term (current) use of systemic steroids: Secondary | ICD-10-CM | POA: Insufficient documentation

## 2014-03-16 DIAGNOSIS — M069 Rheumatoid arthritis, unspecified: Secondary | ICD-10-CM | POA: Diagnosis not present

## 2014-03-16 DIAGNOSIS — Z87891 Personal history of nicotine dependence: Secondary | ICD-10-CM | POA: Diagnosis not present

## 2014-03-16 DIAGNOSIS — Z8669 Personal history of other diseases of the nervous system and sense organs: Secondary | ICD-10-CM | POA: Insufficient documentation

## 2014-03-16 DIAGNOSIS — E785 Hyperlipidemia, unspecified: Secondary | ICD-10-CM | POA: Insufficient documentation

## 2014-03-16 DIAGNOSIS — F419 Anxiety disorder, unspecified: Secondary | ICD-10-CM | POA: Diagnosis not present

## 2014-03-16 DIAGNOSIS — Z79899 Other long term (current) drug therapy: Secondary | ICD-10-CM | POA: Diagnosis not present

## 2014-03-16 DIAGNOSIS — Z7901 Long term (current) use of anticoagulants: Secondary | ICD-10-CM | POA: Insufficient documentation

## 2014-03-16 DIAGNOSIS — I10 Essential (primary) hypertension: Secondary | ICD-10-CM | POA: Diagnosis not present

## 2014-03-16 DIAGNOSIS — Z8709 Personal history of other diseases of the respiratory system: Secondary | ICD-10-CM | POA: Diagnosis not present

## 2014-03-16 LAB — URINALYSIS, ROUTINE W REFLEX MICROSCOPIC
BILIRUBIN URINE: NEGATIVE
Glucose, UA: NEGATIVE mg/dL
Hgb urine dipstick: NEGATIVE
Ketones, ur: NEGATIVE mg/dL
Leukocytes, UA: NEGATIVE
NITRITE: NEGATIVE
PROTEIN: NEGATIVE mg/dL
Specific Gravity, Urine: 1.015 (ref 1.005–1.030)
UROBILINOGEN UA: 0.2 mg/dL (ref 0.0–1.0)
pH: 6.5 (ref 5.0–8.0)

## 2014-03-16 LAB — COMPREHENSIVE METABOLIC PANEL
ALT: 29 U/L (ref 0–53)
AST: 23 U/L (ref 0–37)
Albumin: 4.1 g/dL (ref 3.5–5.2)
Alkaline Phosphatase: 74 U/L (ref 39–117)
Anion gap: 10 (ref 5–15)
BUN: 24 mg/dL — AB (ref 6–23)
CALCIUM: 9.3 mg/dL (ref 8.4–10.5)
CHLORIDE: 100 mmol/L (ref 96–112)
CO2: 29 mmol/L (ref 19–32)
CREATININE: 1.01 mg/dL (ref 0.50–1.35)
GFR, EST AFRICAN AMERICAN: 79 mL/min — AB (ref >90)
GFR, EST NON AFRICAN AMERICAN: 68 mL/min — AB (ref >90)
Glucose, Bld: 147 mg/dL — ABNORMAL HIGH (ref 70–99)
Potassium: 4.4 mmol/L (ref 3.5–5.1)
Sodium: 139 mmol/L (ref 135–145)
Total Bilirubin: 1 mg/dL (ref 0.3–1.2)
Total Protein: 7.1 g/dL (ref 6.0–8.3)

## 2014-03-16 LAB — DIGOXIN LEVEL: Digoxin Level: 0.9 ng/mL (ref 0.8–2.0)

## 2014-03-16 LAB — CBC WITH DIFFERENTIAL/PLATELET
BASOS ABS: 0 10*3/uL (ref 0.0–0.1)
Basophils Relative: 0 % (ref 0–1)
EOS ABS: 0.1 10*3/uL (ref 0.0–0.7)
Eosinophils Relative: 1 % (ref 0–5)
HCT: 47.2 % (ref 39.0–52.0)
Hemoglobin: 15.4 g/dL (ref 13.0–17.0)
Lymphocytes Relative: 5 % — ABNORMAL LOW (ref 12–46)
Lymphs Abs: 0.5 10*3/uL — ABNORMAL LOW (ref 0.7–4.0)
MCH: 32.2 pg (ref 26.0–34.0)
MCHC: 32.6 g/dL (ref 30.0–36.0)
MCV: 98.7 fL (ref 78.0–100.0)
Monocytes Absolute: 0.4 10*3/uL (ref 0.1–1.0)
Monocytes Relative: 5 % (ref 3–12)
NEUTROS PCT: 89 % — AB (ref 43–77)
Neutro Abs: 8.5 10*3/uL — ABNORMAL HIGH (ref 1.7–7.7)
PLATELETS: 258 10*3/uL (ref 150–400)
RBC: 4.78 MIL/uL (ref 4.22–5.81)
RDW: 15 % (ref 11.5–15.5)
WBC: 9.5 10*3/uL (ref 4.0–10.5)

## 2014-03-16 NOTE — Progress Notes (Addendum)
CSW met with pt at bedside. Pt shares that he is a new resident at [facility]. Patient shares that he has been there for three days and has been there previouslly. Pt states, "i'm just having a hard time adjusting." Pt shares that his room mate likes the tv extremely loud, and patietn states, "My nerves just can't take it." Pt shares that he tries to have staff help, but his room mate just continues to do it. Pt reports I just felt my bloood pressure going up and its going to continue. CSW and pt discussed finding a quiet place, however pt reports that due to neuropathy it is hard for patietn to get comfortable and doesn't feel like there is an appropriate place. Pt states, "I appreciate you trying to help but its a dead end road. I have to be there, another place isn't going to help, and its just a dead in road." Patient continues to avoid eye contact with CSW and admits to being very depressed. Pt denies SI/HI/AH/VH. Pt does report thinking about dying however does not have a plan or intent. CSW offered to make a referral for services and patient declined. Pt able to contract for safety. CSW provided obudsman and alternative assised living list. CSW also provided patient with mobile crisis number in avs.   Belia Heman, [place]  ED CSW 03/16/2014 3:15 PM

## 2014-03-16 NOTE — ED Provider Notes (Signed)
CSN: [id number]     Arrival date & time 03/16/14  1321 History   First MD Initiated Contact with Patient 03/16/14 1457     Chief Complaint  Patient presents with  . Dizziness  . Hypertension    HPI Pt answers question about why he came to the emergency room by telling me about the facility he is staying at.  He is also is concerned that bills getint sent to the appropriate location.  He does not tell me of any complaints.  He denies weakness, chest pain, nausea or vomiting, shortness of breath or abdominal pain.  He denies dizziness.  Pt resides at a nursing home.  Earlier he had mentioned to nursing staff that he was unhappy about where he was staying.  Past Medical History  Diagnosis Date  . Atrial flutter     a. s/p ablation for RVR  03/2009, Xarelto ongoing  . VENOUS INSUFFICIENCY, LEFT LEG   . PULMONARY FIBROSIS, INTERSTITIAL   . OBSTRUCTIVE SLEEP APNEA   . BENIGN POSITIONAL VERTIGO   . HYPERGLYCEMIA 2011  . ALLERGIC RHINITIS   . Rheumatoid arthritis(714.0)   . Chronic diastolic CHF (congestive heart failure)     a. 10/2013 Echo: EF 45-50%, mild TR, mildly dil LA/RA.  Marland Kitchen PERIPHERAL NEUROPATHY   . HYPERTENSION   . HYPERLIPIDEMIA   . DEPRESSION     [facility] hosp 01/2011 for SI  . Anxiety   . Morbid obesity   . Diverticulosis of colon (without mention of hemorrhage)   . Atrial fibrillation     a. chronic xarelto;  b. prev on amio->d/c'd 2/2 pulmonary fibrosis.  . Pulmonary fibrosis    Past Surgical History  Procedure Laterality Date  . 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 Shoulder surgery Right 09/2009  . Varicose vein surgery      Left Leg  . Radiofrequency ablation    . Colonoscopy w/ biopsies     Family History  Problem Relation Age of Onset  . Heart attack Father 22  . Other Mother 19    Old age  . Colon cancer Neg Hx    History  Substance Use Topics  . Smoking status: Former Smoker -- 1.00 packs/day for  3 years    Types: Cigarettes    Quit date: 01/20/2000  . Smokeless tobacco: Never Used     Comment: Single, lives alone. 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 His family contact would be his sister [name] [phone]  . Alcohol Use: No     Comment: Former    Review of Systems  All other systems reviewed and are negative.     Allergies  Methotrexate  Home Medications   Prior to Admission medications   Medication Sig Start Date End Date Taking? Authorizing Provider  calcium carbonate (OS-CAL - DOSED IN MG OF ELEMENTAL CALCIUM) 1250 (500 CA) MG tablet Take 2 tablets by mouth daily.   Yes Historical Provider, MD  Cholecalciferol (VITAMIN D3) 1000 UNITS CAPS Take 1 capsule by mouth daily.    Yes Historical Provider, MD  digoxin (LANOXIN) 0.125 MG tablet Take 1 tablet (0.125 mg total) by mouth daily. 12/01/13  Yes Dwana Melena, PA-C  diltiazem (CARDIZEM CD) 300 MG 24 hr capsule Take 1 capsule (300 mg total) by mouth daily. 12/01/13  Yes Dwana Melena, PA-C  furosemide (LASIX) 40 MG tablet Take  40 mg by mouth 2 (two) times daily.  11/10/13  Yes Historical Provider, MD  gabapentin (NEURONTIN) 600 MG tablet Take 1,200 mg by mouth at bedtime.    Yes Historical Provider, MD  leflunomide (ARAVA) 20 MG tablet Take 20 mg by mouth daily. 10/10/13  Yes Historical Provider, MD  Melatonin 5 MG CAPS Take 1 capsule by mouth at bedtime.   Yes Historical Provider, MD  memantine (NAMENDA) 10 MG tablet Take 1 tablet (10 mg total) by mouth daily. 02/12/14  Yes Newt Lukes, MD  Multiple Vitamin (MULTIVITAMIN) tablet Take 1 tablet by mouth daily.     Yes Historical Provider, MD  OMEGA-3 1000 MG CAPS Take 1,000 mg by mouth daily.    Yes Historical Provider, MD  omeprazole (PRILOSEC) 20 MG capsule Take 1 capsule (20 mg total) by mouth 2 (two) times daily. 07/07/13  Yes Newt Lukes, MD  predniSONE (DELTASONE) 10 MG tablet Take 1 tablet (10 mg total) by mouth daily with  breakfast. 02/23/14  Yes Catarina Hartshorn, MD  rivaroxaban (XARELTO) 20 MG TABS tablet Take 1 tablet (20 mg total) by mouth daily with supper. Patient taking differently: Take 20 mg by mouth daily.  12/01/13  Yes Dwana Melena, PA-C  sertraline (ZOLOFT) 50 MG tablet Take 50 mg by mouth daily.   Yes Historical Provider, MD  benzonatate (TESSALON) 200 MG capsule Take 1 capsule (200 mg total) by mouth 3 (three) times daily as needed for cough. Patient not taking: Reported on 03/16/2014 01/31/14   Oretha Milch, MD  Coral Calcium 1000 (390 CA) MG TABS Take 2 capsules by mouth daily.     Historical Provider, MD  feeding supplement, ENSURE COMPLETE, (ENSURE COMPLETE) LIQD Take 237 mLs by mouth 2 (two) times daily between meals. Patient not taking: Reported on 03/16/2014 02/23/14   Catarina Hartshorn, MD  HYDROcodone-acetaminophen (NORCO/VICODIN) 5-325 MG per tablet Take 1 tablet by mouth every 6 (six) hours as needed for moderate pain.  10/12/13   Historical Provider, MD  HYDROcodone-acetaminophen (NORCO/VICODIN) 5-325 MG per tablet Take 1 tablet by mouth every 6 (six) hours. Patient taking differently: Take 1 tablet by mouth every 6 (six) hours as needed.  02/23/14   Catarina Hartshorn, MD  levofloxacin (LEVAQUIN) 500 MG tablet Take 1 tablet (500 mg total) by mouth daily. Patient not taking: Reported on 03/16/2014 02/23/14   Catarina Hartshorn, MD   BP 173/90 mmHg  Pulse 81  Temp(Src) 98.3 F (36.8 C) (Oral)  Resp 11  SpO2 94% Physical Exam  Constitutional: He appears well-developed and well-nourished. No distress.  HENT:  Head: Normocephalic and atraumatic.  Right Ear: External ear normal.  Left Ear: External ear normal.  Eyes: Conjunctivae are normal. Right eye exhibits no discharge. Left eye exhibits no discharge. No scleral icterus.  Neck: Neck supple. No tracheal deviation present.  Cardiovascular: Normal rate, regular rhythm and intact distal pulses.   Pulmonary/Chest: Effort normal and breath sounds normal. No stridor. No  respiratory distress. He has no wheezes. He has no rales.  Abdominal: Soft. Bowel sounds are normal. He exhibits no distension. There is no tenderness. There is no rebound and no guarding.  Musculoskeletal: He exhibits no edema or tenderness.  Neurological: He is alert. He has normal strength. No cranial nerve deficit (no facial droop, extraocular movements intact, no slurred speech) or sensory deficit. He exhibits normal muscle tone. He displays no seizure activity. Coordination normal.  Normal and Equal grip strength bilateral upper extremities, normal and equal  strength plantar flexion bilateral lower extremities, sensation intact throughout  Skin: Skin is warm and dry. No rash noted.  Psychiatric: He has a normal mood and affect.  Nursing note and vitals reviewed.   ED Course  Procedures (including critical care time) Labs Review Labs Reviewed  COMPREHENSIVE METABOLIC PANEL - Abnormal; Notable for the following:    Glucose, Bld 147 (*)    BUN 24 (*)    GFR calc non Af Amer 68 (*)    GFR calc Af Amer 79 (*)    All other components within normal limits  CBC WITH DIFFERENTIAL/PLATELET - Abnormal; Notable for the following:    Neutrophils Relative % 89 (*)    Neutro Abs 8.5 (*)    Lymphocytes Relative 5 (*)    Lymphs Abs 0.5 (*)    All other components within normal limits  URINE CULTURE  URINALYSIS, ROUTINE W REFLEX MICROSCOPIC  DIGOXIN LEVEL      EKG Interpretation  Date/Time:  Friday March 16 2014 13:28:11 EST Ventricular Rate:  81 PR Interval:    QRS Duration: 96 QT Interval:  358 QTC Calculation: 415 R Axis:   117 Text Interpretation:  Atrial fibrillation Paired ventricular premature complexes Right axis deviation Borderline low voltage, extremity leads Anteroseptal infarct, old Borderline repolarization abnormality No significant change since last tracing Confirmed by Carden Teel  MD-J, Caylen Yardley ([id number]) on 03/16/2014 3:48:10 PM        MDM   Final diagnoses:  Essential  hypertension   Pt is here primarily because he is unhappy with his new nursing home.  Pt had been living at an assisted living facility but is now residing at a NH since his recent hospitalization.  He is htn but no evidence of acute end organ damage associated with that.  Will need outpatient follow up but no acute intervention needed.  Pt was seen by Child psychotherapist.  He is depressed about his new living situation but no SI or HI.  Pt does not require psychiatric hospitalization.  Stable for return to his nursing home.    Linwood Dibbles, MD 03/16/14 [phone]

## 2014-03-16 NOTE — ED Notes (Signed)
Bed: HW86 Expected date: 03/16/14 Expected time: 1:12 PM Means of arrival:  Comments: EMS male hypertension from nursing home

## 2014-03-16 NOTE — ED Notes (Signed)
Per EMS- Patient is a resident of [address] and is new resident. Patient called EMS himself. When EMS arrived he c/o feeling unhappy at the facility and when questioned about several things, patient did say he felt a little dizzy. Patient continued to complain about not liking the facility.

## 2014-03-16 NOTE — ED Notes (Signed)
[name] at [facility] given report and PTAR called for transportation back to [facility].

## 2014-03-16 NOTE — Discharge Instructions (Signed)

## 2014-03-16 NOTE — Telephone Encounter (Signed)
Called Britta Mccreedy back was told was not in the office. spoke with Selena Batten gave her md response. Will fax over med sheet...Raechel Chute

## 2014-03-18 LAB — URINE CULTURE

## 2014-03-19 DIAGNOSIS — M069 Rheumatoid arthritis, unspecified: Secondary | ICD-10-CM | POA: Diagnosis not present

## 2014-03-19 DIAGNOSIS — J449 Chronic obstructive pulmonary disease, unspecified: Secondary | ICD-10-CM | POA: Diagnosis not present

## 2014-03-19 DIAGNOSIS — F0390 Unspecified dementia without behavioral disturbance: Secondary | ICD-10-CM | POA: Diagnosis not present

## 2014-03-19 DIAGNOSIS — I4891 Unspecified atrial fibrillation: Secondary | ICD-10-CM | POA: Diagnosis not present

## 2014-03-19 DIAGNOSIS — J8410 Pulmonary fibrosis, unspecified: Secondary | ICD-10-CM | POA: Diagnosis not present

## 2014-03-19 DIAGNOSIS — Z7901 Long term (current) use of anticoagulants: Secondary | ICD-10-CM | POA: Diagnosis not present

## 2014-03-19 DIAGNOSIS — Z9981 Dependence on supplemental oxygen: Secondary | ICD-10-CM | POA: Diagnosis not present

## 2014-03-19 DIAGNOSIS — F329 Major depressive disorder, single episode, unspecified: Secondary | ICD-10-CM | POA: Diagnosis not present

## 2014-03-19 NOTE — Telephone Encounter (Signed)
Attempted to call patient again today, line busy.  Sent message to Linda Hedges advising her that I am having trouble getting through to patient.

## 2014-03-20 ENCOUNTER — Encounter: Payer: Self-pay | Admitting: *Deleted

## 2014-03-20 DIAGNOSIS — J449 Chronic obstructive pulmonary disease, unspecified: Secondary | ICD-10-CM | POA: Diagnosis not present

## 2014-03-20 DIAGNOSIS — F329 Major depressive disorder, single episode, unspecified: Secondary | ICD-10-CM | POA: Diagnosis not present

## 2014-03-20 DIAGNOSIS — M069 Rheumatoid arthritis, unspecified: Secondary | ICD-10-CM | POA: Diagnosis not present

## 2014-03-20 DIAGNOSIS — I4891 Unspecified atrial fibrillation: Secondary | ICD-10-CM | POA: Diagnosis not present

## 2014-03-20 DIAGNOSIS — J8410 Pulmonary fibrosis, unspecified: Secondary | ICD-10-CM | POA: Diagnosis not present

## 2014-03-20 DIAGNOSIS — F0390 Unspecified dementia without behavioral disturbance: Secondary | ICD-10-CM | POA: Diagnosis not present

## 2014-03-20 DIAGNOSIS — Z7901 Long term (current) use of anticoagulants: Secondary | ICD-10-CM | POA: Diagnosis not present

## 2014-03-20 DIAGNOSIS — Z9981 Dependence on supplemental oxygen: Secondary | ICD-10-CM | POA: Diagnosis not present

## 2014-03-20 NOTE — Telephone Encounter (Signed)
Letter sent to patient requesting patient to call our office to schedule appointment with Tammy Parrett for O2 sat qualifications.  Nothing further needed.

## 2014-03-22 DIAGNOSIS — Z9981 Dependence on supplemental oxygen: Secondary | ICD-10-CM | POA: Diagnosis not present

## 2014-03-22 DIAGNOSIS — Z7901 Long term (current) use of anticoagulants: Secondary | ICD-10-CM | POA: Diagnosis not present

## 2014-03-22 DIAGNOSIS — M069 Rheumatoid arthritis, unspecified: Secondary | ICD-10-CM | POA: Diagnosis not present

## 2014-03-22 DIAGNOSIS — F0390 Unspecified dementia without behavioral disturbance: Secondary | ICD-10-CM | POA: Diagnosis not present

## 2014-03-22 DIAGNOSIS — F329 Major depressive disorder, single episode, unspecified: Secondary | ICD-10-CM | POA: Diagnosis not present

## 2014-03-22 DIAGNOSIS — I4891 Unspecified atrial fibrillation: Secondary | ICD-10-CM | POA: Diagnosis not present

## 2014-03-22 DIAGNOSIS — J449 Chronic obstructive pulmonary disease, unspecified: Secondary | ICD-10-CM | POA: Diagnosis not present

## 2014-03-22 DIAGNOSIS — J8410 Pulmonary fibrosis, unspecified: Secondary | ICD-10-CM | POA: Diagnosis not present

## 2014-03-26 DIAGNOSIS — I429 Cardiomyopathy, unspecified: Secondary | ICD-10-CM | POA: Diagnosis not present

## 2014-03-26 DIAGNOSIS — I480 Paroxysmal atrial fibrillation: Secondary | ICD-10-CM | POA: Diagnosis not present

## 2014-03-26 DIAGNOSIS — I10 Essential (primary) hypertension: Secondary | ICD-10-CM | POA: Diagnosis not present

## 2014-03-27 DIAGNOSIS — R6889 Other general symptoms and signs: Secondary | ICD-10-CM | POA: Diagnosis not present

## 2014-03-28 DIAGNOSIS — I10 Essential (primary) hypertension: Secondary | ICD-10-CM | POA: Diagnosis not present

## 2014-03-28 NOTE — Telephone Encounter (Signed)
They can perform oxygen qualification at the facility that he is at

## 2014-03-29 NOTE — Telephone Encounter (Addendum)
Attempted to contact patient but unable to reach him.  Per Dr. [name], pt can have oxygen qualification performed at his facility, does not need appointment with [place].    To Stefannie to follow up.

## 2014-04-03 DIAGNOSIS — I10 Essential (primary) hypertension: Secondary | ICD-10-CM | POA: Diagnosis not present

## 2014-04-03 DIAGNOSIS — E1159 Type 2 diabetes mellitus with other circulatory complications: Secondary | ICD-10-CM | POA: Diagnosis not present

## 2014-04-03 DIAGNOSIS — E039 Hypothyroidism, unspecified: Secondary | ICD-10-CM | POA: Diagnosis not present

## 2014-04-03 DIAGNOSIS — E785 Hyperlipidemia, unspecified: Secondary | ICD-10-CM | POA: Diagnosis not present

## 2014-04-03 DIAGNOSIS — D649 Anemia, unspecified: Secondary | ICD-10-CM | POA: Diagnosis not present

## 2014-04-03 DIAGNOSIS — D519 Vitamin B12 deficiency anemia, unspecified: Secondary | ICD-10-CM | POA: Diagnosis not present

## 2014-04-05 NOTE — Telephone Encounter (Signed)
Have tried to call pt every day this week. There is no voicemail, the phone only rings.

## 2014-04-14 DIAGNOSIS — N390 Urinary tract infection, site not specified: Secondary | ICD-10-CM | POA: Diagnosis not present

## 2014-04-16 DIAGNOSIS — I480 Paroxysmal atrial fibrillation: Secondary | ICD-10-CM | POA: Diagnosis not present

## 2014-04-16 DIAGNOSIS — M6281 Muscle weakness (generalized): Secondary | ICD-10-CM | POA: Diagnosis not present

## 2014-04-16 DIAGNOSIS — I10 Essential (primary) hypertension: Secondary | ICD-10-CM | POA: Diagnosis not present

## 2014-04-17 DIAGNOSIS — R609 Edema, unspecified: Secondary | ICD-10-CM | POA: Diagnosis not present

## 2014-04-17 DIAGNOSIS — R05 Cough: Secondary | ICD-10-CM | POA: Diagnosis not present

## 2014-04-18 DIAGNOSIS — D649 Anemia, unspecified: Secondary | ICD-10-CM | POA: Diagnosis not present

## 2014-04-18 DIAGNOSIS — I10 Essential (primary) hypertension: Secondary | ICD-10-CM | POA: Diagnosis not present

## 2014-04-30 DIAGNOSIS — J8410 Pulmonary fibrosis, unspecified: Secondary | ICD-10-CM | POA: Diagnosis not present

## 2014-04-30 DIAGNOSIS — I480 Paroxysmal atrial fibrillation: Secondary | ICD-10-CM | POA: Diagnosis not present

## 2014-05-08 DIAGNOSIS — I10 Essential (primary) hypertension: Secondary | ICD-10-CM | POA: Diagnosis not present

## 2014-05-08 DIAGNOSIS — D649 Anemia, unspecified: Secondary | ICD-10-CM | POA: Diagnosis not present

## 2014-05-09 DIAGNOSIS — M6281 Muscle weakness (generalized): Secondary | ICD-10-CM | POA: Diagnosis not present

## 2014-05-09 DIAGNOSIS — F0390 Unspecified dementia without behavioral disturbance: Secondary | ICD-10-CM | POA: Diagnosis not present

## 2014-05-09 DIAGNOSIS — J449 Chronic obstructive pulmonary disease, unspecified: Secondary | ICD-10-CM | POA: Diagnosis not present

## 2014-05-09 DIAGNOSIS — R2689 Other abnormalities of gait and mobility: Secondary | ICD-10-CM | POA: Diagnosis not present

## 2014-05-09 DIAGNOSIS — I10 Essential (primary) hypertension: Secondary | ICD-10-CM | POA: Diagnosis not present

## 2014-05-11 DIAGNOSIS — R2689 Other abnormalities of gait and mobility: Secondary | ICD-10-CM | POA: Diagnosis not present

## 2014-05-11 DIAGNOSIS — M6281 Muscle weakness (generalized): Secondary | ICD-10-CM | POA: Diagnosis not present

## 2014-05-11 DIAGNOSIS — F0390 Unspecified dementia without behavioral disturbance: Secondary | ICD-10-CM | POA: Diagnosis not present

## 2014-05-11 DIAGNOSIS — I10 Essential (primary) hypertension: Secondary | ICD-10-CM | POA: Diagnosis not present

## 2014-05-11 DIAGNOSIS — J449 Chronic obstructive pulmonary disease, unspecified: Secondary | ICD-10-CM | POA: Diagnosis not present

## 2014-05-12 DIAGNOSIS — M6281 Muscle weakness (generalized): Secondary | ICD-10-CM | POA: Diagnosis not present

## 2014-05-12 DIAGNOSIS — R2689 Other abnormalities of gait and mobility: Secondary | ICD-10-CM | POA: Diagnosis not present

## 2014-05-12 DIAGNOSIS — I10 Essential (primary) hypertension: Secondary | ICD-10-CM | POA: Diagnosis not present

## 2014-05-12 DIAGNOSIS — F0390 Unspecified dementia without behavioral disturbance: Secondary | ICD-10-CM | POA: Diagnosis not present

## 2014-05-12 DIAGNOSIS — J449 Chronic obstructive pulmonary disease, unspecified: Secondary | ICD-10-CM | POA: Diagnosis not present

## 2014-05-14 DIAGNOSIS — R2689 Other abnormalities of gait and mobility: Secondary | ICD-10-CM | POA: Diagnosis not present

## 2014-05-14 DIAGNOSIS — I10 Essential (primary) hypertension: Secondary | ICD-10-CM | POA: Diagnosis not present

## 2014-05-14 DIAGNOSIS — J449 Chronic obstructive pulmonary disease, unspecified: Secondary | ICD-10-CM | POA: Diagnosis not present

## 2014-05-14 DIAGNOSIS — F0390 Unspecified dementia without behavioral disturbance: Secondary | ICD-10-CM | POA: Diagnosis not present

## 2014-05-14 DIAGNOSIS — M6281 Muscle weakness (generalized): Secondary | ICD-10-CM | POA: Diagnosis not present

## 2014-05-16 DIAGNOSIS — J449 Chronic obstructive pulmonary disease, unspecified: Secondary | ICD-10-CM | POA: Diagnosis not present

## 2014-05-16 DIAGNOSIS — F0390 Unspecified dementia without behavioral disturbance: Secondary | ICD-10-CM | POA: Diagnosis not present

## 2014-05-16 DIAGNOSIS — M6281 Muscle weakness (generalized): Secondary | ICD-10-CM | POA: Diagnosis not present

## 2014-05-16 DIAGNOSIS — R2689 Other abnormalities of gait and mobility: Secondary | ICD-10-CM | POA: Diagnosis not present

## 2014-05-16 DIAGNOSIS — I10 Essential (primary) hypertension: Secondary | ICD-10-CM | POA: Diagnosis not present

## 2014-05-18 DIAGNOSIS — G629 Polyneuropathy, unspecified: Secondary | ICD-10-CM | POA: Diagnosis not present

## 2014-05-18 DIAGNOSIS — I10 Essential (primary) hypertension: Secondary | ICD-10-CM | POA: Diagnosis not present

## 2014-05-24 ENCOUNTER — Ambulatory Visit (INDEPENDENT_AMBULATORY_CARE_PROVIDER_SITE_OTHER)
Admission: RE | Admit: 2014-05-24 | Discharge: 2014-05-24 | Disposition: A | Discharge status: Home / Self Care | Payer: Medicare Other | Source: Ambulatory Visit | Attending: Internal Medicine | Admitting: Internal Medicine

## 2014-05-24 ENCOUNTER — Encounter: Payer: Self-pay | Admitting: Internal Medicine

## 2014-05-24 ENCOUNTER — Ambulatory Visit (INDEPENDENT_AMBULATORY_CARE_PROVIDER_SITE_OTHER): Payer: Medicare Other | Admitting: Internal Medicine

## 2014-05-24 ENCOUNTER — Other Ambulatory Visit (INDEPENDENT_AMBULATORY_CARE_PROVIDER_SITE_OTHER): Payer: Medicare Other

## 2014-05-24 VITALS — BP 126/78 | HR 78 | Temp 98.1°F | Wt 198.0 lb

## 2014-05-24 DIAGNOSIS — R0600 Dyspnea, unspecified: Secondary | ICD-10-CM

## 2014-05-24 DIAGNOSIS — J8489 Other specified interstitial pulmonary diseases: Secondary | ICD-10-CM | POA: Diagnosis not present

## 2014-05-24 DIAGNOSIS — J84112 Idiopathic pulmonary fibrosis: Secondary | ICD-10-CM | POA: Diagnosis not present

## 2014-05-24 DIAGNOSIS — R0602 Shortness of breath: Secondary | ICD-10-CM | POA: Diagnosis not present

## 2014-05-24 LAB — CBC WITH DIFFERENTIAL/PLATELET
Basophils Absolute: 0.1 10*3/uL (ref 0.0–0.1)
Basophils Relative: 0.5 % (ref 0.0–3.0)
EOS ABS: 0 10*3/uL (ref 0.0–0.7)
Eosinophils Relative: 0.3 % (ref 0.0–5.0)
HEMATOCRIT: 37.8 % — AB (ref 39.0–52.0)
Hemoglobin: 12.9 g/dL — ABNORMAL LOW (ref 13.0–17.0)
LYMPHS ABS: 0.7 10*3/uL (ref 0.7–4.0)
Lymphocytes Relative: 5.4 % — ABNORMAL LOW (ref 12.0–46.0)
MCHC: 34.1 g/dL (ref 30.0–36.0)
MCV: 95.9 fl (ref 78.0–100.0)
Monocytes Absolute: 0.7 10*3/uL (ref 0.1–1.0)
Monocytes Relative: 5 % (ref 3.0–12.0)
Neutro Abs: 12.2 10*3/uL — ABNORMAL HIGH (ref 1.4–7.7)
Neutrophils Relative %: 88.8 % — ABNORMAL HIGH (ref 43.0–77.0)
Platelets: 360 10*3/uL (ref 150.0–400.0)
RBC: 3.94 Mil/uL — ABNORMAL LOW (ref 4.22–5.81)
RDW: 18.4 % — AB (ref 11.5–15.5)
WBC: 13.7 10*3/uL — ABNORMAL HIGH (ref 4.0–10.5)

## 2014-05-24 LAB — BASIC METABOLIC PANEL
BUN: 16 mg/dL (ref 6–23)
CO2: 31 mEq/L (ref 19–32)
Calcium: 9.6 mg/dL (ref 8.4–10.5)
Chloride: 101 mEq/L (ref 96–112)
Creatinine, Ser: 0.83 mg/dL (ref 0.40–1.50)
GFR: 94.61 mL/min (ref >60.00)
GLUCOSE: 132 mg/dL — AB (ref 70–99)
Potassium: 4.8 mEq/L (ref 3.5–5.1)
Sodium: 139 mEq/L (ref 135–145)

## 2014-05-24 LAB — BRAIN NATRIURETIC PEPTIDE: PRO B NATRI PEPTIDE: 223 pg/mL — AB (ref 0.0–100.0)

## 2014-05-24 LAB — SEDIMENTATION RATE: Sed Rate: 44 mm/hr — ABNORMAL HIGH (ref 0–22)

## 2014-05-24 LAB — TSH: TSH: 1.12 u[IU]/mL (ref 0.35–4.50)

## 2014-05-24 MED ORDER — FAMOTIDINE 20 MG PO TABS: ORAL_TABLET | ORAL | Status: DC

## 2014-05-24 MED ORDER — PREDNISONE 10 MG PO TABS: ORAL_TABLET | ORAL | Status: DC

## 2014-05-24 MED ORDER — AMOXICILLIN-POT CLAVULANATE 875-125 MG PO TABS: 1.0000 | ORAL_TABLET | Freq: Two times a day (BID) | ORAL | Status: DC

## 2014-05-24 MED ORDER — PANTOPRAZOLE SODIUM 40 MG PO TBEC: 40.0000 mg | DELAYED_RELEASE_TABLET | Freq: Every day | ORAL | Status: DC
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
## 2014-05-24 NOTE — Patient Instructions (Addendum)
Stop fish oil   Pantoprazole (protonix) 40 mg   Take 30-60 min before first meal of the day and Pepcid 20 mg one bedtime until return to office - this is the best way to tell whether stomach acid is contributing to your problem.    GERD (REFLUX)  is an extremely common cause of respiratory symptoms just like yours , many times with no obvious heartburn at all.    It can be treated with medication, but also with lifestyle changes including avoidance of late meals, excessive alcohol, smoking cessation, and avoid fatty foods, chocolate, peppermint, colas, red wine, and acidic juices such as orange juice.  NO MINT OR MENTHOL PRODUCTS SO NO COUGH DROPS  USE SUGARLESS CANDY INSTEAD (Jolley ranchers or Stover's or Life Savers) or even ice chips will also do - the key is to swallow to prevent all throat clearing. NO OIL BASED VITAMINS - use powdered substitutes.  Prednisone 10 mg take  4 each am x 2 days,   2 each am x 2 days,  1 each am x 2 days and continue whatever dose you were taking ( if none then stop it)   Augmentin 875 mg take one pill twice daily  X 10 days - take at breakfast and supper with large glass of water.  It would help reduce the usual side effects (diarrhea and yeast infections) if you ate cultured yogurt at lunch.   For cough mucinex dm  1200 mg every 12 hours as needed and stop the tussin   See Tammy NP in 1 week for recheck with all active medications in hand including the over the counter meds   Please remember to go to the lab and x-ray department downstairs for your tests - we will call you with the results when they are available.

## 2014-05-26 DIAGNOSIS — M6281 Muscle weakness (generalized): Secondary | ICD-10-CM | POA: Diagnosis not present

## 2014-05-26 DIAGNOSIS — I10 Essential (primary) hypertension: Secondary | ICD-10-CM | POA: Diagnosis not present

## 2014-05-26 DIAGNOSIS — R2689 Other abnormalities of gait and mobility: Secondary | ICD-10-CM | POA: Diagnosis not present

## 2014-05-26 DIAGNOSIS — F0390 Unspecified dementia without behavioral disturbance: Secondary | ICD-10-CM | POA: Diagnosis not present

## 2014-05-26 DIAGNOSIS — J449 Chronic obstructive pulmonary disease, unspecified: Secondary | ICD-10-CM | POA: Diagnosis not present

## 2014-05-28 ENCOUNTER — Encounter: Payer: Self-pay | Admitting: Internal Medicine

## 2014-05-28 NOTE — Assessment & Plan Note (Signed)
08/2013 - DLCO 56%, FVC 68% 09/2012 - FVC 2.7 -71%, DLCO 15.3 - 51% Per Radiology c/w UIP May be IPF - RA appears quiescent clinically, also CCP neg, ?some response to prednisone   I had an extended discussion with the patient reviewing all relevant studies completed to date and  lasting 15 to 20 minutes of a 25 minute visit on the following ongoing concerns:   Not really clear if/ when last too prednisone but main c/o at present is cough and afraid to make more recs until we know what he's really taking   So will rx conservatively until have a chance to get a handle on what exactly he's been taking by doing full complete med reconciliation w/in a week if he does not get readmitted to snf in meantime (where his condition was stable)   To keep things simple, I have asked the patient to first separate medicines that are perceived as maintenance, that is to be taken daily "no matter what", from those medicines that are taken on only on an as-needed basis and I have given the patient examples of both, and then return to see our NP to generate a  detailed  medication calendar which should be followed until the next physician sees the patient and updates it.    See instructions for specific recommendations which were reviewed directly with the patient who was given a copy with highlighter outlining the key components.

## 2014-05-28 NOTE — Assessment & Plan Note (Signed)
05/24/2014   Walked RA x 2 laps @ 185 ft each stopped due to sob slow pace, no desats  No need for 02 at present/ bnp intermediate but not overt chf

## 2014-05-30 ENCOUNTER — Telehealth: Payer: Self-pay | Admitting: Internal Medicine

## 2014-05-30 NOTE — Telephone Encounter (Signed)
Called and spoke to Jackson. Shanda Bumps stated she already has the last OV note and nothing further is needed. Will sign off.

## 2014-05-31 ENCOUNTER — Ambulatory Visit: Payer: Medicare Other | Admitting: Adult Health

## 2014-05-31 DIAGNOSIS — M6281 Muscle weakness (generalized): Secondary | ICD-10-CM | POA: Diagnosis not present

## 2014-05-31 DIAGNOSIS — I10 Essential (primary) hypertension: Secondary | ICD-10-CM | POA: Diagnosis not present

## 2014-05-31 DIAGNOSIS — R2689 Other abnormalities of gait and mobility: Secondary | ICD-10-CM | POA: Diagnosis not present

## 2014-05-31 DIAGNOSIS — F0390 Unspecified dementia without behavioral disturbance: Secondary | ICD-10-CM | POA: Diagnosis not present

## 2014-05-31 DIAGNOSIS — J449 Chronic obstructive pulmonary disease, unspecified: Secondary | ICD-10-CM | POA: Diagnosis not present

## 2014-05-31 DIAGNOSIS — J8410 Pulmonary fibrosis, unspecified: Secondary | ICD-10-CM | POA: Diagnosis not present

## 2014-06-02 DIAGNOSIS — R2689 Other abnormalities of gait and mobility: Secondary | ICD-10-CM | POA: Diagnosis not present

## 2014-06-02 DIAGNOSIS — F0390 Unspecified dementia without behavioral disturbance: Secondary | ICD-10-CM | POA: Diagnosis not present

## 2014-06-02 DIAGNOSIS — J449 Chronic obstructive pulmonary disease, unspecified: Secondary | ICD-10-CM | POA: Diagnosis not present

## 2014-06-02 DIAGNOSIS — M6281 Muscle weakness (generalized): Secondary | ICD-10-CM | POA: Diagnosis not present

## 2014-06-02 DIAGNOSIS — I10 Essential (primary) hypertension: Secondary | ICD-10-CM | POA: Diagnosis not present

## 2014-06-04 DIAGNOSIS — R2689 Other abnormalities of gait and mobility: Secondary | ICD-10-CM | POA: Diagnosis not present

## 2014-06-04 DIAGNOSIS — J449 Chronic obstructive pulmonary disease, unspecified: Secondary | ICD-10-CM | POA: Diagnosis not present

## 2014-06-04 DIAGNOSIS — F0390 Unspecified dementia without behavioral disturbance: Secondary | ICD-10-CM | POA: Diagnosis not present

## 2014-06-04 DIAGNOSIS — M6281 Muscle weakness (generalized): Secondary | ICD-10-CM | POA: Diagnosis not present

## 2014-06-04 DIAGNOSIS — I10 Essential (primary) hypertension: Secondary | ICD-10-CM | POA: Diagnosis not present

## 2014-06-05 DIAGNOSIS — D649 Anemia, unspecified: Secondary | ICD-10-CM | POA: Diagnosis not present

## 2014-06-05 DIAGNOSIS — I10 Essential (primary) hypertension: Secondary | ICD-10-CM | POA: Diagnosis not present

## 2014-06-06 DIAGNOSIS — I10 Essential (primary) hypertension: Secondary | ICD-10-CM | POA: Diagnosis not present

## 2014-06-06 DIAGNOSIS — R2689 Other abnormalities of gait and mobility: Secondary | ICD-10-CM | POA: Diagnosis not present

## 2014-06-06 DIAGNOSIS — M6281 Muscle weakness (generalized): Secondary | ICD-10-CM | POA: Diagnosis not present

## 2014-06-06 DIAGNOSIS — J449 Chronic obstructive pulmonary disease, unspecified: Secondary | ICD-10-CM | POA: Diagnosis not present

## 2014-06-06 DIAGNOSIS — F0390 Unspecified dementia without behavioral disturbance: Secondary | ICD-10-CM | POA: Diagnosis not present

## 2014-06-07 DIAGNOSIS — G3184 Mild cognitive impairment, so stated: Secondary | ICD-10-CM | POA: Diagnosis not present

## 2014-06-07 DIAGNOSIS — F331 Major depressive disorder, recurrent, moderate: Secondary | ICD-10-CM | POA: Diagnosis not present

## 2014-06-07 DIAGNOSIS — F064 Anxiety disorder due to known physiological condition: Secondary | ICD-10-CM | POA: Diagnosis not present

## 2014-06-09 DIAGNOSIS — M6281 Muscle weakness (generalized): Secondary | ICD-10-CM | POA: Diagnosis not present

## 2014-06-09 DIAGNOSIS — R2689 Other abnormalities of gait and mobility: Secondary | ICD-10-CM | POA: Diagnosis not present

## 2014-06-09 DIAGNOSIS — F0390 Unspecified dementia without behavioral disturbance: Secondary | ICD-10-CM | POA: Diagnosis not present

## 2014-06-09 DIAGNOSIS — J449 Chronic obstructive pulmonary disease, unspecified: Secondary | ICD-10-CM | POA: Diagnosis not present

## 2014-06-09 DIAGNOSIS — I10 Essential (primary) hypertension: Secondary | ICD-10-CM | POA: Diagnosis not present

## 2014-06-13 DIAGNOSIS — I10 Essential (primary) hypertension: Secondary | ICD-10-CM | POA: Diagnosis not present

## 2014-06-13 DIAGNOSIS — J449 Chronic obstructive pulmonary disease, unspecified: Secondary | ICD-10-CM | POA: Diagnosis not present

## 2014-06-13 DIAGNOSIS — R2689 Other abnormalities of gait and mobility: Secondary | ICD-10-CM | POA: Diagnosis not present

## 2014-06-13 DIAGNOSIS — M6281 Muscle weakness (generalized): Secondary | ICD-10-CM | POA: Diagnosis not present

## 2014-06-13 DIAGNOSIS — F0390 Unspecified dementia without behavioral disturbance: Secondary | ICD-10-CM | POA: Diagnosis not present

## 2014-06-23 DIAGNOSIS — M6281 Muscle weakness (generalized): Secondary | ICD-10-CM | POA: Diagnosis not present

## 2014-06-23 DIAGNOSIS — J449 Chronic obstructive pulmonary disease, unspecified: Secondary | ICD-10-CM | POA: Diagnosis not present

## 2014-06-23 DIAGNOSIS — R2689 Other abnormalities of gait and mobility: Secondary | ICD-10-CM | POA: Diagnosis not present

## 2014-06-23 DIAGNOSIS — F0390 Unspecified dementia without behavioral disturbance: Secondary | ICD-10-CM | POA: Diagnosis not present

## 2014-06-23 DIAGNOSIS — I10 Essential (primary) hypertension: Secondary | ICD-10-CM | POA: Diagnosis not present

## 2014-06-28 DIAGNOSIS — R2689 Other abnormalities of gait and mobility: Secondary | ICD-10-CM | POA: Diagnosis not present

## 2014-06-28 DIAGNOSIS — M6281 Muscle weakness (generalized): Secondary | ICD-10-CM | POA: Diagnosis not present

## 2014-06-28 DIAGNOSIS — I10 Essential (primary) hypertension: Secondary | ICD-10-CM | POA: Diagnosis not present

## 2014-06-28 DIAGNOSIS — J449 Chronic obstructive pulmonary disease, unspecified: Secondary | ICD-10-CM | POA: Diagnosis not present

## 2014-06-28 DIAGNOSIS — F064 Anxiety disorder due to known physiological condition: Secondary | ICD-10-CM | POA: Diagnosis not present

## 2014-06-28 DIAGNOSIS — F331 Major depressive disorder, recurrent, moderate: Secondary | ICD-10-CM | POA: Diagnosis not present

## 2014-06-28 DIAGNOSIS — G3184 Mild cognitive impairment, so stated: Secondary | ICD-10-CM | POA: Diagnosis not present

## 2014-06-28 DIAGNOSIS — F0390 Unspecified dementia without behavioral disturbance: Secondary | ICD-10-CM | POA: Diagnosis not present

## 2014-07-09 DIAGNOSIS — M6281 Muscle weakness (generalized): Secondary | ICD-10-CM | POA: Diagnosis not present

## 2014-07-09 DIAGNOSIS — I480 Paroxysmal atrial fibrillation: Secondary | ICD-10-CM | POA: Diagnosis not present

## 2014-07-09 DIAGNOSIS — I5032 Chronic diastolic (congestive) heart failure: Secondary | ICD-10-CM | POA: Diagnosis not present

## 2014-07-09 DIAGNOSIS — J8410 Pulmonary fibrosis, unspecified: Secondary | ICD-10-CM | POA: Diagnosis not present

## 2014-07-09 DIAGNOSIS — I10 Essential (primary) hypertension: Secondary | ICD-10-CM | POA: Diagnosis not present

## 2014-07-10 DIAGNOSIS — I10 Essential (primary) hypertension: Secondary | ICD-10-CM | POA: Diagnosis not present

## 2014-07-11 ENCOUNTER — Encounter (HOSPITAL_COMMUNITY): Payer: Self-pay | Admitting: Emergency Medicine

## 2014-07-11 ENCOUNTER — Emergency Department (HOSPITAL_COMMUNITY): Payer: Medicare Other

## 2014-07-11 ENCOUNTER — Emergency Department (HOSPITAL_COMMUNITY)
Admission: EM | Admit: 2014-07-11 | Discharge: 2014-07-11 | Disposition: A | Discharge status: Home / Self Care | Payer: Medicare Other | Attending: Emergency Medicine | Admitting: Emergency Medicine

## 2014-07-11 DIAGNOSIS — F419 Anxiety disorder, unspecified: Secondary | ICD-10-CM | POA: Diagnosis not present

## 2014-07-11 DIAGNOSIS — Z7901 Long term (current) use of anticoagulants: Secondary | ICD-10-CM | POA: Diagnosis not present

## 2014-07-11 DIAGNOSIS — I5032 Chronic diastolic (congestive) heart failure: Secondary | ICD-10-CM | POA: Diagnosis not present

## 2014-07-11 DIAGNOSIS — I4892 Unspecified atrial flutter: Secondary | ICD-10-CM | POA: Insufficient documentation

## 2014-07-11 DIAGNOSIS — F329 Major depressive disorder, single episode, unspecified: Secondary | ICD-10-CM | POA: Diagnosis not present

## 2014-07-11 DIAGNOSIS — Z8709 Personal history of other diseases of the respiratory system: Secondary | ICD-10-CM | POA: Diagnosis not present

## 2014-07-11 DIAGNOSIS — S4991XA Unspecified injury of right shoulder and upper arm, initial encounter: Secondary | ICD-10-CM | POA: Insufficient documentation

## 2014-07-11 DIAGNOSIS — M25511 Pain in right shoulder: Secondary | ICD-10-CM | POA: Diagnosis not present

## 2014-07-11 DIAGNOSIS — R32 Unspecified urinary incontinence: Secondary | ICD-10-CM | POA: Diagnosis not present

## 2014-07-11 DIAGNOSIS — Y999 Unspecified external cause status: Secondary | ICD-10-CM | POA: Diagnosis not present

## 2014-07-11 DIAGNOSIS — I4891 Unspecified atrial fibrillation: Secondary | ICD-10-CM | POA: Insufficient documentation

## 2014-07-11 DIAGNOSIS — M069 Rheumatoid arthritis, unspecified: Secondary | ICD-10-CM | POA: Diagnosis not present

## 2014-07-11 DIAGNOSIS — I10 Essential (primary) hypertension: Secondary | ICD-10-CM | POA: Diagnosis not present

## 2014-07-11 DIAGNOSIS — W19XXXA Unspecified fall, initial encounter: Secondary | ICD-10-CM

## 2014-07-11 DIAGNOSIS — Z87891 Personal history of nicotine dependence: Secondary | ICD-10-CM | POA: Insufficient documentation

## 2014-07-11 DIAGNOSIS — Z9889 Other specified postprocedural states: Secondary | ICD-10-CM | POA: Diagnosis not present

## 2014-07-11 DIAGNOSIS — Z905 Acquired absence of kidney: Secondary | ICD-10-CM | POA: Diagnosis not present

## 2014-07-11 DIAGNOSIS — Y939 Activity, unspecified: Secondary | ICD-10-CM | POA: Insufficient documentation

## 2014-07-11 DIAGNOSIS — Y92198 Other place in other specified residential institution as the place of occurrence of the external cause: Secondary | ICD-10-CM | POA: Insufficient documentation

## 2014-07-11 DIAGNOSIS — Z79899 Other long term (current) drug therapy: Secondary | ICD-10-CM | POA: Diagnosis not present

## 2014-07-11 DIAGNOSIS — R0902 Hypoxemia: Secondary | ICD-10-CM | POA: Diagnosis not present

## 2014-07-11 DIAGNOSIS — Z8719 Personal history of other diseases of the digestive system: Secondary | ICD-10-CM | POA: Diagnosis not present

## 2014-07-11 DIAGNOSIS — G629 Polyneuropathy, unspecified: Secondary | ICD-10-CM | POA: Diagnosis not present

## 2014-07-11 DIAGNOSIS — W1839XA Other fall on same level, initial encounter: Secondary | ICD-10-CM | POA: Diagnosis not present

## 2014-07-11 NOTE — ED Provider Notes (Signed)
CSN: [id number]     Arrival date & time 07/11/14  1733 History   First MD Initiated Contact with Patient 07/11/14 1742     Chief Complaint  Patient presents with  . Fall     (Consider location/radiation/quality/duration/timing/severity/associated sxs/prior Treatment) HPI Comments: Patient from [facility] assisted living presents to the emergency department with chief complaint of fall. Patient states that he was going to sit down for dinner, when he fell backward striking his right shoulder against the wall. He did not follow the ground. He denies any head injury or loss of consciousness. States that his shoulder is improved now. Nothing makes the symptoms better or worse. There are no other associated symptoms.  The history is provided by the patient. No language interpreter was used.    Past Medical History  Diagnosis Date  . Atrial flutter     a. s/p ablation for RVR  03/2009, Xarelto ongoing  . VENOUS INSUFFICIENCY, LEFT LEG   . PULMONARY FIBROSIS, INTERSTITIAL   . OBSTRUCTIVE SLEEP APNEA   . BENIGN POSITIONAL VERTIGO   . HYPERGLYCEMIA 2011  . ALLERGIC RHINITIS   . Rheumatoid arthritis(714.0)   . Chronic diastolic CHF (congestive heart failure)     a. 10/2013 Echo: EF 45-50%, mild TR, mildly dil LA/RA.  Marland Kitchen PERIPHERAL NEUROPATHY   . HYPERTENSION   . HYPERLIPIDEMIA   . DEPRESSION     [facility] 01/2011 for SI  . Anxiety   . Morbid obesity   . Diverticulosis of colon (without mention of hemorrhage)   . Atrial fibrillation     a. chronic xarelto;  b. prev on amio->d/c'd 2/2 pulmonary fibrosis.  . Pulmonary fibrosis    Past Surgical History  Procedure Laterality Date  . 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 Shoulder surgery Right 09/2009  . Varicose vein surgery      Left Leg  . Radiofrequency ablation    . Colonoscopy w/ biopsies     Family History  Problem Relation Age of Onset  . Heart attack Father 33  .  Other Mother 23    Old age  . Colon cancer Neg Hx    History  Substance Use Topics  . Smoking status: Former Smoker -- 1.00 packs/day for 3 years    Types: Cigarettes    Quit date: 01/20/2000  . Smokeless tobacco: Never Used     Comment: Single, lives alone. 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 His family contact would be his sister [name] [phone]  . Alcohol Use: No     Comment: Former    Review of Systems  Constitutional: Negative for fever and chills.  Respiratory: Negative for shortness of breath.   Cardiovascular: Negative for chest pain.  Gastrointestinal: Negative for nausea, vomiting, diarrhea and constipation.  Genitourinary: Negative for dysuria.  Musculoskeletal: Negative for myalgias, joint swelling and arthralgias.      Allergies  Methotrexate  Home Medications   Prior to Admission medications   Medication Sig Start Date End Date Taking? Authorizing Provider  busPIRone (BUSPAR) 5 MG tablet Take 7.5 mg by mouth 2 (two) times daily.   Yes Historical Provider, MD  calcium-vitamin D (OSCAL WITH D) 500-200 MG-UNIT per tablet Take 2 tablets by mouth daily.   Yes Historical Provider, MD  Cholecalciferol (VITAMIN D3) 1000 UNITS CAPS Take 1 capsule by mouth daily.    Yes Historical  Provider, MD  Coral Calcium 1000 (390 CA) MG TABS Take 1 capsule by mouth daily.    Yes Historical Provider, MD  dextromethorphan-guaiFENesin (MUCINEX DM) 30-600 MG per 12 hr tablet Take 1 tablet by mouth every 12 (twelve) hours as needed for cough.   Yes Historical Provider, MD  digoxin (LANOXIN) 0.125 MG tablet Take 1 tablet (0.125 mg total) by mouth daily. 12/01/13  Yes Dwana Melena, PA-C  diltiazem (CARDIZEM CD) 300 MG 24 hr capsule Take 1 capsule (300 mg total) by mouth daily. 12/01/13  Yes Dwana Melena, PA-C  famotidine (PEPCID) 20 MG tablet One at bedtime 05/24/14  Yes Nyoka Cowden, MD  furosemide (LASIX) 40 MG tablet Take 40 mg by mouth 2 (two)  times daily.  11/10/13  Yes Historical Provider, MD  gabapentin (NEURONTIN) 600 MG tablet Take 1,200 mg by mouth at bedtime.    Yes Historical Provider, MD  leflunomide (ARAVA) 20 MG tablet Take 20 mg by mouth daily. 10/10/13  Yes Historical Provider, MD  LORazepam (ATIVAN) 0.5 MG tablet Take 0.5 mg by mouth 2 (two) times daily.   Yes Historical Provider, MD  Melatonin 5 MG CAPS Take 1 capsule by mouth at bedtime.   Yes Historical Provider, MD  memantine (NAMENDA XR) 7 MG CP24 24 hr capsule Take 7 mg by mouth daily.   Yes Historical Provider, MD  Multiple Vitamin (MULTIVITAMIN) tablet Take 1 tablet by mouth daily.     Yes Historical Provider, MD  pantoprazole (PROTONIX) 40 MG tablet Take 1 tablet (40 mg total) by mouth daily. Take 30-60 min before first meal of the day 05/24/14  Yes Nyoka Cowden, MD  predniSONE (DELTASONE) 10 MG tablet 4 x 2 days, 2 x 2 days, 1 x 2 days, then continue regular dose Patient taking differently: Take 10 mg by mouth daily with breakfast.  05/24/14  Yes Nyoka Cowden, MD  psyllium (REGULOID) 0.52 G capsule Take 0.52 g by mouth at bedtime.   Yes Historical Provider, MD  rivaroxaban (XARELTO) 20 MG TABS tablet Take 1 tablet (20 mg total) by mouth daily with supper. Patient taking differently: Take 20 mg by mouth daily.  12/01/13  Yes Dwana Melena, PA-C  amoxicillin-clavulanate (AUGMENTIN) 875-125 MG per tablet Take 1 tablet by mouth 2 (two) times daily. Patient not taking: Reported on 07/11/2014 05/24/14   Nyoka Cowden, MD  memantine (NAMENDA) 10 MG tablet Take 1 tablet (10 mg total) by mouth daily. Patient not taking: Reported on 07/11/2014 02/12/14   Newt Lukes, MD   BP 121/84 mmHg  Pulse 68  Temp(Src) 98.5 F (36.9 C) (Oral)  Resp 18  SpO2 92% Physical Exam  Constitutional: He is oriented to person, place, and time. He appears well-developed and well-nourished.  HENT:  Head: Normocephalic and atraumatic.  Eyes: Conjunctivae and EOM are normal. Pupils are  equal, round, and reactive to light. Right eye exhibits no discharge. Left eye exhibits no discharge. No scleral icterus.  Neck: Normal range of motion. Neck supple. No JVD present.  Cardiovascular: Normal rate, regular rhythm and normal heart sounds.  Exam reveals no gallop and no friction rub.   No murmur heard. Pulmonary/Chest: Effort normal and breath sounds normal. No respiratory distress. He has no wheezes. He has no rales. He exhibits no tenderness.  Abdominal: Soft. He exhibits no distension and no mass. There is no tenderness. There is no rebound and no guarding.  Musculoskeletal: Normal range of motion. He exhibits no  edema or tenderness.  Range of motion and strength of right shoulder 5/5, no bony abnormality or deformity  Neurological: He is alert and oriented to person, place, and time.  Skin: Skin is warm and dry.  Psychiatric: He has a normal mood and affect. His behavior is normal. Judgment and thought content normal.  Nursing note and vitals reviewed.   ED Course  Procedures (including critical care time) Labs Review Labs Reviewed - No data to display  Imaging Review Dg Shoulder Right  07/11/2014   CLINICAL DATA:  Right shoulder pain secondary to a fall at her assisted living facility today.  EXAM: RIGHT SHOULDER - 2+ VIEW  COMPARISON:  MRI dated 07/19/2008 and chest x-ray dated 05/24/2014  FINDINGS: There is no evidence of fracture or dislocation. Slight degenerative changes of the acromioclavicular joint. Chronic interstitial and obstructive lung disease.  IMPRESSION: No acute abnormality.   Electronically Signed   By: Francene Boyers M.D.   On: 07/11/2014 18:49     EKG Interpretation None      MDM   Final diagnoses:  Fall  Shoulder pain, acute, right    Patient with right shoulder pain after a fall. He told the ground, but rather hit his scapula against the wall. There is no bony abnormality or deformity. He did not hit his head or pass out. Patient feels well.  He is stable and ready for discharge.    Roxy Horseman, PA-C 07/11/14 1859  Rolland Porter, MD 07/17/14 [phone]

## 2014-07-11 NOTE — ED Notes (Signed)
[facility] presents with a 81 yo male from [facility] with a fall from a chair.  Pt ambulated with walker to dining area for dinner, pt went to sit in chair and lost balance and fell back in chair against the wall.  No LOC, No head pain, pt A & Ox4.  Pt was c/o of back pain in the area of right scapula to mid upper back where there is a small area of redness but no longer has pain at this time.  Pt has hx of COPD and neuropathy of feet.  CBG 144; VS stable

## 2014-07-11 NOTE — Discharge Instructions (Signed)
Arthralgia °Your caregiver has diagnosed you as suffering from an arthralgia. Arthralgia means there is pain in a joint. This can come from many reasons including: °· Bruising the joint which causes soreness (inflammation) in the joint. °· Wear and tear on the joints which occur as we grow older (osteoarthritis). °· Overusing the joint. °· Various forms of arthritis. °· Infections of the joint. °Regardless of the cause of pain in your joint, most of these different pains respond to anti-inflammatory drugs and rest. The exception to this is when a joint is infected, and these cases are treated with antibiotics, if it is a bacterial infection. °HOME CARE INSTRUCTIONS  °· Rest the injured area for as long as directed by your caregiver. Then slowly start using the joint as directed by your caregiver and as the pain allows. Crutches as directed may be useful if the ankles, knees or hips are involved. If the knee was splinted or casted, continue use and care as directed. If an stretchy or elastic wrapping bandage has been applied today, it should be removed and re-applied every 3 to 4 hours. It should not be applied tightly, but firmly enough to keep swelling down. Watch toes and feet for swelling, bluish discoloration, coldness, numbness or excessive pain. If any of these problems (symptoms) occur, remove the ace bandage and re-apply more loosely. If these symptoms persist, contact your caregiver or return to this location. °· For the first 24 hours, keep the injured extremity elevated on pillows while lying down. °· Apply ice for 15-20 minutes to the sore joint every couple hours while awake for the first half day. Then 03-04 times per day for the first 48 hours. Put the ice in a plastic bag and place a towel between the bag of ice and your skin. °· Wear any splinting, casting, elastic bandage applications, or slings as instructed. °· Only take over-the-counter or prescription medicines for pain, discomfort, or fever as  directed by your caregiver. Do not use aspirin immediately after the injury unless instructed by your physician. Aspirin can cause increased bleeding and bruising of the tissues. °· If you were given crutches, continue to use them as instructed and do not resume weight bearing on the sore joint until instructed. °Persistent pain and inability to use the sore joint as directed for more than 2 to 3 days are warning signs indicating that you should see a caregiver for a follow-up visit as soon as possible. Initially, a hairline fracture (break in bone) may not be evident on X-rays. Persistent pain and swelling indicate that further evaluation, non-weight bearing or use of the joint (use of crutches or slings as instructed), or further X-rays are indicated. X-rays may sometimes not show a small fracture until a week or 10 days later. Make a follow-up appointment with your own caregiver or one to whom we have referred you. A radiologist (specialist in reading X-rays) may read your X-rays. Make sure you know how you are to obtain your X-ray results. Do not assume everything is normal if you do not hear from us. °SEEK MEDICAL CARE IF: °Bruising, swelling, or pain increases. °SEEK IMMEDIATE MEDICAL CARE IF:  °· Your fingers or toes are numb or blue. °· The pain is not responding to medications and continues to stay the same or get worse. °· The pain in your joint becomes severe. °· You develop a fever over 102° F (38.9° C). °· It becomes impossible to move or use the joint. °MAKE SURE YOU:  °·   Understand these instructions. °· Will watch your condition. °· Will get help right away if you are not doing well or get worse. °Document Released: 01/05/2005 Document Revised: 03/30/2011 Document Reviewed: 08/24/2007 °ExitCare® Patient Information ©2015 [employer]. This information is not intended to replace advice given to you by your health care provider. Make sure you discuss any questions you have with your health care  provider. °Fall Prevention and Home Safety °Falls cause injuries and can affect all age groups. It is possible to use preventive measures to significantly decrease the likelihood of falls. There are many simple measures which can make your home safer and prevent falls. °OUTDOORS °· Repair cracks and edges of walkways and driveways. °· Remove high doorway thresholds. °· Trim shrubbery on the main path into your home. °· Have good outside lighting. °· Clear walkways of tools, rocks, debris, and clutter. °· Check that handrails are not broken and are securely fastened. Both sides of steps should have handrails. °· Have leaves, snow, and ice cleared regularly. °· Use sand or salt on walkways during winter months. °· In the garage, clean up grease or oil spills. °BATHROOM °· Install night lights. °· Install grab bars by the toilet and in the tub and shower. °· Use non-skid mats or decals in the tub or shower. °· Place a plastic non-slip stool in the shower to sit on, if needed. °· Keep floors dry and clean up all water on the floor immediately. °· Remove soap buildup in the tub or shower on a regular basis. °· Secure bath mats with non-slip, double-sided rug tape. °· Remove throw rugs and tripping hazards from the floors. °BEDROOMS °· Install night lights. °· Make sure a bedside light is easy to reach. °· Do not use oversized bedding. °· Keep a telephone by your bedside. °· Have a firm chair with side arms to use for getting dressed. °· Remove throw rugs and tripping hazards from the floor. °KITCHEN °· Keep handles on pots and pans turned toward the center of the stove. Use back burners when possible. °· Clean up spills quickly and allow time for drying. °· Avoid walking on wet floors. °· Avoid hot utensils and knives. °· Position shelves so they are not too high or low. °· Place commonly used objects within easy reach. °· If necessary, use a sturdy step stool with a grab bar when reaching. °· Keep electrical cables out  of the way. °· Do not use floor polish or wax that makes floors slippery. If you must use wax, use non-skid floor wax. °· Remove throw rugs and tripping hazards from the floor. °STAIRWAYS °· Never leave objects on stairs. °· Place handrails on both sides of stairways and use them. Fix any loose handrails. Make sure handrails on both sides of the stairways are as long as the stairs. °· Check carpeting to make sure it is firmly attached along stairs. Make repairs to worn or loose carpet promptly. °· Avoid placing throw rugs at the top or bottom of stairways, or properly secure the rug with carpet tape to prevent slippage. Get rid of throw rugs, if possible. °· Have an electrician put in a light switch at the top and bottom of the stairs. °OTHER FALL PREVENTION TIPS °· Wear low-heel or rubber-soled shoes that are supportive and fit well. Wear closed toe shoes. °· When using a stepladder, make sure it is fully opened and both spreaders are firmly locked. Do not climb a closed stepladder. °· Add color or   contrast paint or tape to grab bars and handrails in your home. Place contrasting color strips on first and last steps. °· Learn and use mobility aids as needed. Install an electrical emergency response system. °· Turn on lights to avoid dark areas. Replace light bulbs that burn out immediately. Get light switches that glow. °· Arrange furniture to create clear pathways. Keep furniture in the same place. °· Firmly attach carpet with non-skid or double-sided tape. °· Eliminate uneven floor surfaces. °· Select a carpet pattern that does not visually hide the edge of steps. °· Be aware of all pets. °OTHER HOME SAFETY TIPS °· Set the water temperature for 120° F (48.8° C). °· Keep emergency numbers on or near the telephone. °· Keep smoke detectors on every level of the home and near sleeping areas. °Document Released: 12/26/2001 Document Revised: 07/07/2011 Document Reviewed: 03/27/2011 °Exit[employer]® Patient Information ©2015  [employer]. This information is not intended to replace advice given to you by your health care provider. Make sure you discuss any questions you have with your health care provider. ° °

## 2014-07-11 NOTE — ED Notes (Signed)
Bed: WA20 Expected date:  Expected time:  Means of arrival:  Comments: EMS/14F/head and back pain

## 2014-07-17 DIAGNOSIS — I10 Essential (primary) hypertension: Secondary | ICD-10-CM | POA: Diagnosis not present

## 2014-07-17 DIAGNOSIS — I400 Infective myocarditis: Secondary | ICD-10-CM | POA: Diagnosis not present

## 2014-08-06 DIAGNOSIS — I5032 Chronic diastolic (congestive) heart failure: Secondary | ICD-10-CM | POA: Diagnosis not present

## 2014-08-06 DIAGNOSIS — K5730 Diverticulosis of large intestine without perforation or abscess without bleeding: Secondary | ICD-10-CM | POA: Diagnosis not present

## 2014-08-06 DIAGNOSIS — I10 Essential (primary) hypertension: Secondary | ICD-10-CM | POA: Diagnosis not present

## 2014-08-06 DIAGNOSIS — I480 Paroxysmal atrial fibrillation: Secondary | ICD-10-CM | POA: Diagnosis not present

## 2014-08-07 DIAGNOSIS — I10 Essential (primary) hypertension: Secondary | ICD-10-CM | POA: Diagnosis not present

## 2014-08-07 DIAGNOSIS — E559 Vitamin D deficiency, unspecified: Secondary | ICD-10-CM | POA: Diagnosis not present

## 2014-08-09 DIAGNOSIS — F331 Major depressive disorder, recurrent, moderate: Secondary | ICD-10-CM | POA: Diagnosis not present

## 2014-08-09 DIAGNOSIS — R32 Unspecified urinary incontinence: Secondary | ICD-10-CM | POA: Diagnosis not present

## 2014-08-09 DIAGNOSIS — G3184 Mild cognitive impairment, so stated: Secondary | ICD-10-CM | POA: Diagnosis not present

## 2014-08-09 DIAGNOSIS — F064 Anxiety disorder due to known physiological condition: Secondary | ICD-10-CM | POA: Diagnosis not present

## 2014-08-20 DIAGNOSIS — H2513 Age-related nuclear cataract, bilateral: Secondary | ICD-10-CM | POA: Diagnosis not present

## 2014-08-21 DIAGNOSIS — N189 Chronic kidney disease, unspecified: Secondary | ICD-10-CM | POA: Diagnosis not present

## 2014-08-23 DIAGNOSIS — F064 Anxiety disorder due to known physiological condition: Secondary | ICD-10-CM | POA: Diagnosis not present

## 2014-08-23 DIAGNOSIS — F4322 Adjustment disorder with anxiety: Secondary | ICD-10-CM | POA: Diagnosis not present

## 2014-08-23 DIAGNOSIS — G3184 Mild cognitive impairment, so stated: Secondary | ICD-10-CM | POA: Diagnosis not present

## 2014-08-23 DIAGNOSIS — F331 Major depressive disorder, recurrent, moderate: Secondary | ICD-10-CM | POA: Diagnosis not present

## 2014-08-23 DIAGNOSIS — G4733 Obstructive sleep apnea (adult) (pediatric): Secondary | ICD-10-CM | POA: Diagnosis not present

## 2014-09-10 DIAGNOSIS — R32 Unspecified urinary incontinence: Secondary | ICD-10-CM | POA: Diagnosis not present

## 2014-09-11 ENCOUNTER — Ambulatory Visit (INDEPENDENT_AMBULATORY_CARE_PROVIDER_SITE_OTHER): Payer: Medicare Other

## 2014-09-11 VITALS — BP 142/70 | HR 70 | Temp 98.0°F | Ht 68.0 in | Wt 194.2 lb

## 2014-09-11 DIAGNOSIS — Z23 Encounter for immunization: Secondary | ICD-10-CM | POA: Diagnosis not present

## 2014-09-11 DIAGNOSIS — Z0000 Encounter for general adult medical examination without abnormal findings: Secondary | ICD-10-CM

## 2014-09-11 NOTE — Progress Notes (Addendum)
Subjective:   [name] is a 81 y.o. male who presents for Medicare Annual (Subsequent) preventive examination.  Review of Systems:  HRA assessment completed during visit; [name]  Patient is here for Annual Wellness Assessment The Patient was informed that this wellness visit is to identify risk and educate on how to reduce risk for increase disease through lifestyle changes.  The patient verbalized understanding that any voiced medical issues still need to be directed to their physician.    Problem list reviewed and are being managed medically;  Will educate for lifestyle changes as appropriate for diastolic Heart failure; HTN; but this was deferred to assess other issues; The patient is currently in a facility that manages his meds.   HX of a fall after missing chair and was in ER on 6/22/ c/o of hx vertigo and peripheral neuropathy and states his balance is an issue. Hx of xarelto therapy secondary to atrial fib/ rate in the 70's   Fall risk continues; presents with walker stabilized with tennis balls on back legs. Walks bent over; but was able to see well enough to guide himself around obstacles; He sits down without assistance and gets up without assistance;   Some reported dementia 01/29/2014 The patient does speak tangentially on occasion, but is able to pull this back into real time issues. Had "sun" stroke hx; had at 81yo but states he has gait issues for a long time;  Was in [employer];   Mood presents as depressed; angry; States the "place I am at" does not do a good job. The food is bad, no one helps him; his roommate is annoying but also states he has devout spiritual beliefs and does not appreciate the "talk" that he hears with profanity. Feels no one likes him; Does not like TV as it is to negative; Discusses faith; [employer]; In [place]; in [place];  Does not know what meds he takes; Paper work brought with meds and will reconcile.    The patient admits he is depressed; States he has had SI but "not now". States he would never do that but does get discouraged. Discussed apt for fup of his mood and the patient agreed. MMSE was 28; the patient was teary during the interview and did not complete the depression scale at this time, as he is notably having dificulty with his adjustment to [place]. States he is eating ok  States he doesn't get a long with roommate and can't remember his name. Feels memory is poor but educated regarding stress; depression and memory issues.  Lipids in july 2015; HDL 31; A1c 6.2 BMI: 29.5 Diet; eggs; did not discuss diet; states meals are ok, white bread, cheese; They run out of food, feels like they have cut back on food;  Quality of service is not good Exercise; deferred   Family Hx  Heart attack Father 57  Provider Other Mother 62 Old age  Social history: Home [facility]/ was living independently in apt a few months ago.  Safety; one fall noted which resulted in ER visit; states he has balance issues due to neuropathy Has had some PT; didn't feel it helped much; Can't feel his feet  Goes to the bathroom; does not answer if incontinent; states he has some trouble voiding   Discussed fup apt with doctor to review for depression and un incont. Very embarrassed about these issues and states the doctor will not address.  ALLERGIES:  Is not sure but  states he is allergic with codeine .  Psychosocial support; safe community; firearm safety; smoke alarms; In [place] community  Has relationship issues with roommate; Talks on the phone all the time; States "things are not good" "it is bad when you get old and don't feel good".  Did learn later that he has a new roommate and was by himself.   Discusses his moral convictions and feels that counseling would not help as they don't have his convictions.    Discussed Goal to improve health based on risk and will see [name] for fup. Screenings  overdue Prevnar due/ agrees to take Flu vaccine when available ________________________ Ophthalmology exam; States the AL just checked his eyes; States he has brother in law reading glasses, but he has bifocals on; states he needs glasses; will fup on resources for glasses; ? [employer] Immunizations; Tetanus completed due May 2017 Shingles postponed Colonoscopy; 08/2009 EKG 02/2014 Hearing: 2000hz  both ears Dental: false teeth/ don't fit well  Gave information on safety to take home;   Current Care Team reviewed and updated / Still seeks primary care at this office Cardiac Risk Factors include: advanced age (>90men, >49 women);dyslipidemia;hypertension;male gender;obesity (BMI >30kg/m2)     Objective:     Vitals: BP 142/70 mmHg  Pulse 70  Temp(Src) 98 F (36.7 C) (Oral)  Ht 5\' 8"  (1.727 m)  Wt 194 lb 4 oz (88.111 kg)  BMI 29.54 kg/m2  SpO2 96%  Tobacco History  Smoking status  . Former Smoker -- 1.00 packs/day for 3 years  . Types: Cigarettes  . Quit date: 01/20/2000  Smokeless tobacco  . Never Used    Comment: Single, lives alone. 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 His family contact would be his sister [name] [phone]     Counseling given: Not Answered   Past Medical History  Diagnosis Date  . Atrial flutter     a. s/p ablation for RVR  03/2009, Xarelto ongoing  . VENOUS INSUFFICIENCY, LEFT LEG   . PULMONARY FIBROSIS, INTERSTITIAL   . OBSTRUCTIVE SLEEP APNEA   . BENIGN POSITIONAL VERTIGO   . HYPERGLYCEMIA 2011  . ALLERGIC RHINITIS   . Rheumatoid arthritis(714.0)   . Chronic diastolic CHF (congestive heart failure)     a. 10/2013 Echo: EF 45-50%, mild TR, mildly dil LA/RA.  Marland Kitchen PERIPHERAL NEUROPATHY   . HYPERTENSION   . HYPERLIPIDEMIA   . DEPRESSION     [facility] hosp 01/2011 for SI  . Anxiety   . Morbid obesity   . Diverticulosis of colon (without mention of hemorrhage)   . Atrial fibrillation     a. chronic xarelto;  b.  prev on amio->d/c'd 2/2 pulmonary fibrosis.  . Pulmonary fibrosis    Past Surgical History  Procedure Laterality Date  . 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 Shoulder surgery Right 09/2009  . Varicose vein surgery      Left Leg  . Radiofrequency ablation    . Colonoscopy w/ biopsies     Family History  Problem Relation Age of Onset  . Heart attack Father 36  . Other Mother 14    Old age  . Colon cancer Neg Hx    History  Sexual Activity  . Sexual Activity: Not Currently    Outpatient Encounter Prescriptions as of 09/11/2014  Medication Sig  . amoxicillin-clavulanate (AUGMENTIN) 875-125 MG per tablet  Take 1 tablet by mouth 2 (two) times daily. (Patient not taking: Reported on 07/11/2014)  . busPIRone (BUSPAR) 5 MG tablet Take 7.5 mg by mouth 2 (two) times daily.  . calcium-vitamin D (OSCAL WITH D) 500-200 MG-UNIT per tablet Take 2 tablets by mouth daily.  . Cholecalciferol (VITAMIN D3) 1000 UNITS CAPS Take 1 capsule by mouth daily.   . Coral Calcium 1000 (390 CA) MG TABS Take 1 capsule by mouth daily.   Marland Kitchen dextromethorphan-guaiFENesin (MUCINEX DM) 30-600 MG per 12 hr tablet Take 1 tablet by mouth every 12 (twelve) hours as needed for cough.  . digoxin (LANOXIN) 0.125 MG tablet Take 1 tablet (0.125 mg total) by mouth daily.  Marland Kitchen diltiazem (CARDIZEM CD) 300 MG 24 hr capsule Take 1 capsule (300 mg total) by mouth daily.  . famotidine (PEPCID) 20 MG tablet One at bedtime  . furosemide (LASIX) 40 MG tablet Take 40 mg by mouth 2 (two) times daily.   Marland Kitchen gabapentin (NEURONTIN) 600 MG tablet Take 1,200 mg by mouth at bedtime.   Marland Kitchen leflunomide (ARAVA) 20 MG tablet Take 20 mg by mouth daily.  Marland Kitchen LORazepam (ATIVAN) 0.5 MG tablet Take 0.5 mg by mouth 2 (two) times daily.  . Melatonin 5 MG CAPS Take 1 capsule by mouth at bedtime.  . memantine (NAMENDA XR) 7 MG CP24 24 hr capsule Take 7 mg by mouth daily.  . memantine (NAMENDA) 10 MG  tablet Take 1 tablet (10 mg total) by mouth daily. (Patient not taking: Reported on 07/11/2014)  . Multiple Vitamin (MULTIVITAMIN) tablet Take 1 tablet by mouth daily.    . pantoprazole (PROTONIX) 40 MG tablet Take 1 tablet (40 mg total) by mouth daily. Take 30-60 min before first meal of the day  . predniSONE (DELTASONE) 10 MG tablet 4 x 2 days, 2 x 2 days, 1 x 2 days, then continue regular dose (Patient taking differently: Take 10 mg by mouth daily with breakfast. )  . psyllium (REGULOID) 0.52 G capsule Take 0.52 g by mouth at bedtime.  . rivaroxaban (XARELTO) 20 MG TABS tablet Take 1 tablet (20 mg total) by mouth daily with supper. (Patient taking differently: Take 20 mg by mouth daily. )   No facility-administered encounter medications on file as of 09/11/2014.    Activities of Daily Living In your present state of health, do you have any difficulty performing the following activities: 09/11/2014 02/22/2014  Hearing? N Y  Vision? Y N  Difficulty concentrating or making decisions? Malvin Johns  Walking or climbing stairs? Y Y  Dressing or bathing? Y N  Doing errands, shopping? Malvin Johns  Preparing Food and eating ? Y -  Using the Toilet? Y -  In the past six months, have you accidently leaked urine? Y -  Do you have problems with loss of bowel control? N -  Managing your Medications? Y -  Managing your Finances? Y -  Housekeeping or managing your Housekeeping? Y -    Patient Care Team: Newt Lukes, MD as PCP - General Peter M [place], MD as Consulting Physician (Cardiology) Lurena Nida, MD as Consulting Physician (Rheumatology) Kalman Shan, MD as Consulting Physician (Pulmonary Disease) Archer Asa, MD (Psychiatry) Christia Reading, MD (Otolaryngology) Oretha Milch, MD (Pulmonary Disease)    Assessment:    Objective:   The goal of the wellness visit is to assist the patient how to close the gaps in care and create a preventative care plan for the patient.  Personalized  Education  was given regarding mood  Pt determined a personalized goal; see patient goals; Would like new glasses and will fup regarding resources  Assessment included:  Taking meds without issues at facility with assistance. The patient needs assistance with ADL's and IADL's. Labs deferred for CPE when scheduled.  Stress obvious with adjustment issues and referred for treatment to PCP  No Risk for hepatitis or high risk social behavior identified via hepatitis screen/ deferred at this time   MMSE was 28; sometimes will think through answers and generally self-corrects; to r/o depression vs some memory issues    May have bladder incontinence;   End of life planning deferred due to the patient's mood.   Exercise Activities and Dietary recommendations Current Exercise Habits:: Exercise is limited by, Limited by:: neurologic condition(s);orthopedic condition(s);psychological condition(s)  Goals    . vision is not good     States he needs some eye glasses/ will work on referral Needs resources      Fall Risk Fall Risk  09/11/2014 10/02/2013  Falls in the past year? Yes No  Risk for fall due to : Impaired balance/gait;Impaired mobility;Impaired vision Impaired balance/gait  Risk for fall due to (comments): had had PT -   Depression Screen PHQ 2/9 Scores 10/02/2013  PHQ - 2 Score 1     Cognitive Testing MMSE - Mini Mental State Exam 09/11/2014  Orientation to time 4  Orientation to Place 5  Registration 3  Attention/ Calculation 5  Recall 2  Language- name 2 objects 2  Language- repeat 1  Language- follow 3 step command 3  Language- read & follow direction 1  Write a sentence 1  Copy design 1  Total score 28    Immunization History  Administered Date(s) Administered  . Influenza,inj,Quad PF,36+ Mos 10/17/2012, 10/02/2013  . Pneumococcal Polysaccharide-23 02/19/2006  . Td 06/04/2005   Screening Tests Health Maintenance  Topic Date Due  . PNA vac Low Risk Adult (2  of 2 - PCV13) 02/20/2007  . INFLUENZA VACCINE  08/20/2014  . ZOSTAVAX  04/22/2016 (Originally 10/29/1993)  . TETANUS/TDAP  06/05/2015  . COLONOSCOPY  09/17/2019      Plan:   Plan  The patient took his Prevnar today; Will fup with [name] regarding his mood and UA incont  Referrals: will review for assistance with glasses [facility] or other  The patient agrees to take Prevnar and fup with the doctor.    During the course of the visit the patient was educated and counseled about the following appropriate screening and preventive services:   Vaccines to include Pneumoccal, Influenza, Hepatitis B, Td, Zostavax, HCV/ Prevnar today  Electrocardiogram Feb 2016  Cardiovascular Disease/ deferred   Colorectal cancer screening 08/2009  Glaucoma screening ; completed at AL and will call and get results  Nutrition counseling / facility provides meals; States he is eating  Patient Instructions (the written plan) was given to the patient.   Montine Circle, RN  09/11/2014    Medical screening examination/treatment/procedure(s) were performed by non-physician practitioner and as supervising providerI was immediately available for consultation/collaboration. I agree with above. Jeanine Luz, FNP

## 2014-09-11 NOTE — Patient Instructions (Addendum)
[name] , Thank you for taking time to come for your Medicare Wellness Visit. I appreciate your ongoing commitment to your health goals. Please review the following plan we discussed and let me know if I can assist you in the future.   I will check on resources for eyes Will make apt with Greg C for fup regarding depression and urinary incont  These are the goals we discussed: Goals    . vision is not good     States he needs some eye glasses/ will work on referral Needs resources       This is a list of the screening recommended for you and due dates:  Health Maintenance  Topic Date Due  . Pneumonia vaccines (2 of 2 - PCV13) 02/20/2007  . Flu Shot  08/20/2014  . Shingles Vaccine  04/22/2016*  . Tetanus Vaccine  06/05/2015  . Colon Cancer Screening  09/17/2019  *Topic was postponed. The date shown is not the original due date.     Fall Prevention and Home Safety Falls cause injuries and can affect all age groups. It is possible to prevent falls.  HOW TO PREVENT FALLS  Wear shoes with rubber soles that do not have an opening for your toes.  Keep the inside and outside of your house well lit.  Use night lights throughout your home.  Remove clutter from floors.  Clean up floor spills.  Remove throw rugs or fasten them to the floor with carpet tape.  Do not place electrical cords across pathways.  Put grab bars by your tub, shower, and toilet. Do not use towel bars as grab bars.  Put handrails on both sides of the stairway. Fix loose handrails.  Do not climb on stools or stepladders, if possible.  Do not wax your floors.  Repair uneven or unsafe sidewalks, walkways, or stairs.  Keep items you use a lot within reach.  Be aware of pets.  Keep emergency numbers next to the telephone.  Put smoke detectors in your home and near bedrooms. Ask your doctor what other things you can do to prevent falls. Document Released: 11/01/2008 Document Revised: 07/07/2011  Document Reviewed: 04/07/2011 [facility] Patient Information 2015 [place], [place]. This information is not intended to replace advice given to you by your health care provider. Make sure you discuss any questions you have with your health care provider.  Health Maintenance A healthy lifestyle and preventative care can promote health and wellness.  Maintain regular health, dental, and eye exams.  Eat a healthy diet. Foods like vegetables, fruits, whole grains, low-fat dairy products, and lean protein foods contain the nutrients you need and are low in calories. Decrease your intake of foods high in solid fats, added sugars, and salt. Get information about a proper diet from your health care provider, if necessary.  Regular physical exercise is one of the most important things you can do for your health. Most adults should get at least 150 minutes of moderate-intensity exercise (any activity that increases your heart rate and causes you to sweat) each week. In addition, most adults need muscle-strengthening exercises on 2 or more days a week.   Maintain a healthy weight. The body mass index (BMI) is a screening tool to identify possible weight problems. It provides an estimate of body fat based on height and weight. Your health care provider can find your BMI and can help you achieve or maintain a healthy weight. For males 20 years and older:  A BMI below  18.5 is considered underweight.  A BMI of 18.5 to 24.9 is normal.  A BMI of 25 to 29.9 is considered overweight.  A BMI of 30 and above is considered obese.  Maintain normal blood lipids and cholesterol by exercising and minimizing your intake of saturated fat. Eat a balanced diet with plenty of fruits and vegetables. Blood tests for lipids and cholesterol should begin at age 58 and be repeated every 5 years. If your lipid or cholesterol levels are high, you are over age 64, or you are at high risk for heart disease, you may need your cholesterol  levels checked more frequently.Ongoing high lipid and cholesterol levels should be treated with medicines if diet and exercise are not working.  If you smoke, find out from your health care provider how to quit. If you do not use tobacco, do not start.  Lung cancer screening is recommended for adults aged 55-80 years who are at high risk for developing lung cancer because of a history of smoking. A yearly low-dose CT scan of the lungs is recommended for people who have at least a 30-pack-year history of smoking and are current smokers or have quit within the past 15 years. A pack year of smoking is smoking an average of 1 pack of cigarettes a day for 1 year (for example, a 30-pack-year history of smoking could mean smoking 1 pack a day for 30 years or 2 packs a day for 15 years). Yearly screening should continue until the smoker has stopped smoking for at least 15 years. Yearly screening should be stopped for people who develop a health problem that would prevent them from having lung cancer treatment.  If you choose to drink alcohol, do not have more than 2 drinks per day. One drink is considered to be 12 oz (360 mL) of beer, 5 oz (150 mL) of wine, or 1.5 oz (45 mL) of liquor.  Avoid the use of street drugs. Do not share needles with anyone. Ask for help if you need support or instructions about stopping the use of drugs.  High blood pressure causes heart disease and increases the risk of stroke. Blood pressure should be checked at least every 1-2 years. Ongoing high blood pressure should be treated with medicines if weight loss and exercise are not effective.  If you are 17-79 years old, ask your health care provider if you should take aspirin to prevent heart disease.  Diabetes screening involves taking a blood sample to check your fasting blood sugar level. This should be done once every 3 years after age 44 if you are at a normal weight and without risk factors for diabetes. Testing should be  considered at a younger age or be carried out more frequently if you are overweight and have at least 1 risk factor for diabetes.  Colorectal cancer can be detected and often prevented. Most routine colorectal cancer screening begins at the age of 77 and continues through age 92. However, your health care provider may recommend screening at an earlier age if you have risk factors for colon cancer. On a yearly basis, your health care provider may provide home test kits to check for hidden blood in the stool. A small camera at the end of a tube may be used to directly examine the colon (sigmoidoscopy or colonoscopy) to detect the earliest forms of colorectal cancer. Talk to your health care provider about this at age 42 when routine screening begins. A direct exam of the colon should  be repeated every 5-10 years through age 95, unless early forms of precancerous polyps or small growths are found.  People who are at an increased risk for hepatitis B should be screened for this virus. You are considered at high risk for hepatitis B if:  You were born in a country where hepatitis B occurs often. Talk with your health care provider about which countries are considered high risk.  Your parents were born in a high-risk country and you have not received a shot to protect against hepatitis B (hepatitis B vaccine).  You have HIV or AIDS.  You use needles to inject street drugs.  You live with, or have sex with, someone who has hepatitis B.  You are a man who has sex with other men (MSM).  You get hemodialysis treatment.  You take certain medicines for conditions like cancer, organ transplantation, and autoimmune conditions.  Hepatitis C blood testing is recommended for all people born from [birth year] through 1963 and any individual with known risk factors for hepatitis C.  Healthy men should no longer receive prostate-specific antigen (PSA) blood tests as part of routine cancer screening. Talk to your health  care provider about prostate cancer screening.  Testicular cancer screening is not recommended for adolescents or adult males who have no symptoms. Screening includes self-exam, a health care provider exam, and other screening tests. Consult with your health care provider about any symptoms you have or any concerns you have about testicular cancer.  Practice safe sex. Use condoms and avoid high-risk sexual practices to reduce the spread of sexually transmitted infections (STIs).  You should be screened for STIs, including gonorrhea and chlamydia if:  You are sexually active and are younger than 24 years.  You are older than 24 years, and your health care provider tells you that you are at risk for this type of infection.  Your sexual activity has changed since you were last screened, and you are at an increased risk for chlamydia or gonorrhea. Ask your health care provider if you are at risk.  If you are at risk of being infected with HIV, it is recommended that you take a prescription medicine daily to prevent HIV infection. This is called pre-exposure prophylaxis (PrEP). You are considered at risk if:  You are a man who has sex with other men (MSM).  You are a heterosexual man who is sexually active with multiple partners.  You take drugs by injection.  You are sexually active with a partner who has HIV.  Talk with your health care provider about whether you are at high risk of being infected with HIV. If you choose to begin PrEP, you should first be tested for HIV. You should then be tested every 3 months for as long as you are taking PrEP.  Use sunscreen. Apply sunscreen liberally and repeatedly throughout the day. You should seek shade when your shadow is shorter than you. Protect yourself by wearing long sleeves, pants, a wide-brimmed hat, and sunglasses year round whenever you are outdoors.  Tell your health care provider of new moles or changes in moles, especially if there is a  change in shape or color. Also, tell your health care provider if a mole is larger than the size of a pencil eraser.  A one-time screening for abdominal aortic aneurysm (AAA) and surgical repair of large AAAs by ultrasound is recommended for men aged 65-75 years who are current or former smokers.  Stay current with your vaccines (immunizations). Document  Released: 07/04/2007 Document Revised: 01/10/2013 Document Reviewed: 06/02/2010 [facility] Patient Information 2015 [place], [place]. This information is not intended to replace advice given to you by your health care provider. Make sure you discuss any questions you have with your health care provider.

## 2014-09-13 ENCOUNTER — Telehealth: Payer: Self-pay | Admitting: Internal Medicine

## 2014-09-13 ENCOUNTER — Ambulatory Visit: Payer: Self-pay | Admitting: Family

## 2014-09-13 NOTE — Telephone Encounter (Signed)
Please advise at your convenience 

## 2014-09-13 NOTE — Telephone Encounter (Signed)
Patient no showed for FU with Marcos Eke on 8/25.  Please advise.

## 2014-09-17 NOTE — Telephone Encounter (Signed)
Noted No reason to reschedule thanks

## 2014-09-19 ENCOUNTER — Telehealth: Payer: Self-pay

## 2014-09-19 NOTE — Telephone Encounter (Signed)
Call to POA; Brenton Grills. Gave number for [employer], Vance Peper at [phone] to fup on [employer] for glasses; Denise with DSS will assist with application etc.  Noted that the patient was a no show on 8/25 for Marcos Eke; Per Onalee Hua, apparently the facility did not bring him. Another apt scheduled with Dr. Carver Fila on 9/7 at 11am and Onalee Hua will bring him to this apt to fup on mood and UA incontinence;

## 2014-09-19 NOTE — Telephone Encounter (Signed)
To note; meds from facility reviewed and sent to scan; Maintenance meds appeared congruent with current list, except for Namenda xr 14 mg one q day. Famotidine was dc; Not clear if he is taking protonix 40mg 

## 2014-09-26 ENCOUNTER — Ambulatory Visit (INDEPENDENT_AMBULATORY_CARE_PROVIDER_SITE_OTHER): Payer: Medicare Other | Admitting: Family

## 2014-09-26 ENCOUNTER — Telehealth: Payer: Self-pay

## 2014-09-26 ENCOUNTER — Encounter: Payer: Self-pay | Admitting: Family

## 2014-09-26 VITALS — BP 152/88 | HR 76 | Temp 97.9°F | Resp 20 | Ht 68.0 in | Wt 197.8 lb

## 2014-09-26 DIAGNOSIS — F32A Depression, unspecified: Secondary | ICD-10-CM

## 2014-09-26 DIAGNOSIS — N3941 Urge incontinence: Secondary | ICD-10-CM | POA: Diagnosis not present

## 2014-09-26 DIAGNOSIS — F329 Major depressive disorder, single episode, unspecified: Secondary | ICD-10-CM

## 2014-09-26 DIAGNOSIS — G609 Hereditary and idiopathic neuropathy, unspecified: Secondary | ICD-10-CM

## 2014-09-26 MED ORDER — GABAPENTIN 300 MG PO CAPS: 300.0000 mg | ORAL_CAPSULE | Freq: Three times a day (TID) | ORAL | Status: DC

## 2014-09-26 MED ORDER — SERTRALINE HCL 50 MG PO TABS: 50.0000 mg | ORAL_TABLET | Freq: Every day | ORAL | Status: DC

## 2014-09-26 NOTE — Assessment & Plan Note (Signed)
Symptoms and exam consistent with peripheral neuropathy of idiopathic origin. Previously maintained on gabapentin. Restart gabapentin. Follow up if symptoms worsen or fail to improve with addition of medication. May need possible referral to neurology for further assessment.

## 2014-09-26 NOTE — Assessment & Plan Note (Signed)
Continues to experience urinary incontinence. Previously discussed use of condom catheters which patient did not follow through on. Recommend condom catheter as needed to assist with urinary incontinence. Also discussed timed voiding to decrease urge. Catheter not option at this current time. Follow-up with urology if symptoms worsen or fail to improve.

## 2014-09-26 NOTE — Patient Instructions (Signed)
Thank you for choosing [facility].  Summary/Instructions:  Your prescription(s) have been submitted to your pharmacy or been printed and provided for you. Please take as directed and contact our office if you believe you are having problem(s) with the medication(s) or have any questions.  If your symptoms worsen or fail to improve, please contact our office for further instruction, or in case of emergency go directly to the emergency room at the closest medical facility.   Start the condom caths for urinary incontinence.   Start the gabapentin for neuropathy.  Urinary Incontinence Urinary incontinence is the involuntary loss of urine from your bladder. CAUSES  There are many causes of urinary incontinence. They include:  Medicines.  Infections.  Prostatic enlargement, leading to overflow of urine from your bladder.  Surgery.  Neurological diseases.  Emotional factors. SIGNS AND SYMPTOMS Urinary Incontinence can be divided into four types: 1. Urge incontinence. Urge incontinence is the involuntary loss of urine before you have the opportunity to go to the bathroom. There is a sudden urge to void but not enough time to reach a bathroom. 2. Stress incontinence. Stress incontinence is the sudden loss of urine with any activity that forces urine to pass. It is commonly caused by anatomical changes to the pelvis and sphincter areas of your body. 3. Overflow incontinence. Overflow incontinence is the loss of urine from an obstructed opening to your bladder. This results in a backup of urine and a resultant buildup of pressure within the bladder. When the pressure within the bladder exceeds the closing pressure of the sphincter, the urine overflows, which causes incontinence, similar to water overflowing a dam. 4. Total incontinence. Total incontinence is the loss of urine as a result of the inability to store urine within your bladder. DIAGNOSIS  Evaluating the cause of  incontinence may require:  A thorough and complete medical and obstetric history.  A complete physical exam.  Laboratory tests such as a urine culture and sensitivities. When additional tests are indicated, they can include:  An ultrasound exam.  Kidney and bladder X-rays.  Cystoscopy. This is an exam of the bladder using a narrow scope.  Urodynamic testing to test the nerve function to the bladder and sphincter areas. TREATMENT  Treatment for urinary incontinence depends on the cause:  For urge incontinence caused by a bacterial infection, antibiotics will be prescribed. If the urge incontinence is related to medicines you take, your health care provider may have you change the medicine.  For stress incontinence, surgery to re-establish anatomical support to the bladder or sphincter, or both, will often correct the condition.  For overflow incontinence caused by an enlarged prostate, an operation to open the channel through the enlarged prostate will allow the flow of urine out of the bladder. In women with fibroids, a hysterectomy may be recommended.  For total incontinence, surgery on your urinary sphincter may help. An artificial urinary sphincter (an inflatable cuff placed around the urethra) may be required. In women who have developed a hole-like passage between their bladder and vagina (vesicovaginal fistula), surgery to close the fistula often is required. HOME CARE INSTRUCTIONS  Normal daily hygiene and the use of pads or adult diapers that are changed regularly will help prevent odors and skin damage.  Avoid caffeine. It can overstimulate your bladder.  Use the bathroom regularly. Try about every 2-3 hours to go to the bathroom, even if you do not feel the need to do so. Take time to empty your bladder completely. After  urinating, wait a minute. Then try to urinate again.  For causes involving nerve dysfunction, keep a log of the medicines you take and a journal of the  times you go to the bathroom. SEEK MEDICAL CARE IF:  You experience worsening of pain instead of improvement in pain after your procedure.  Your incontinence becomes worse instead of better. SEE IMMEDIATE MEDICAL CARE IF:  You experience fever or shaking chills.  You are unable to pass your urine.  You have redness spreading into your groin or down into your thighs. MAKE SURE YOU:   Understand these instructions.   Will watch your condition.  Will get help right away if you are not doing well or get worse. Document Released: 02/13/2004 Document Revised: 10/26/2012 Document Reviewed: 06/14/2012 [facility] Patient Information 2015 [place], [place]. This information is not intended to replace advice given to you by your health care provider. Make sure you discuss any questions you have with your health care provider.

## 2014-09-26 NOTE — Assessment & Plan Note (Signed)
Symptoms and exam consistent with depression. Denies suicidal ideations, although has had previous suicidal ideations approximately 15 years ago. Started Zoloft. Refer to psychiatry for further evaluation and management. Follow-up in one month or sooner. Advised to seek emergency care and stop medication if symptoms of suicide develop after starting medication.

## 2014-09-26 NOTE — Progress Notes (Signed)
Subjective:    Patient ID: [name], male    DOB: [birth year], 81 y.o.   MRN: [id number]  Chief Complaint  Patient presents with  . Depression    having issues with neuropathy, and would like to talk about lyrica, having urinary issues with incontinence and wants to talk about catheter, that is stuff related to depression    HPI:   [name] is a 81 y.o. male with a PMH of hypertension, atrial flutter, atrial fibrillation, diastolic heart failure, idiopathic pulmonary fibrosis, dysphagia, BPV, rheumatoid arthritis, hyperlipidemia, anxiety, depression, insomnia, urinary incontinence, impaired mobility, and idiopathic peripheral neuropathy who presents today for a follow-up office visit.    1.) Neuropathy - Associated symptom of pain located in his hands and feet and described as a numbness and tingling has been going on for several years. Worsened by sitting still. Modifying factors include gabapentin which did initially his pain but has not taken it for several months. Denies other modifying factors that improve his pain. Questions if lyrica would be an option for the current pain that he is experiencing.  2.) Depression - Previously diagnosed with depression and not currently maintained on medication. Expresses increased depression and anxiety related to his current living situation and medical conditions. Explains how his current assisted living facility provides him with a significant amount of stress including the way that he is treated. Family is concerned for the potential of bipolar. Denies suicidal ideations.  3.) Incontinence - Associated symptom incontinence has been going on for several months and continues to experience increased frequency and urgency. Reports that he has difficulty making it to the toilet as the urge comes on and he has to go quickly and some of the time does not make it. Modifying factors include a urinal which he does not like to use. Questions a  possible catheter.  Allergies  Allergen Reactions  . Codeine     The patient states he has allergy to codeine  . Methotrexate Swelling    REACTION: ONLY HAS ONE KIDNEY, caused swelling in hands and feet    Current Outpatient Prescriptions on File Prior to Visit  Medication Sig Dispense Refill  . busPIRone (BUSPAR) 5 MG tablet Take 7.5 mg by mouth 2 (two) times daily.    . calcium-vitamin D (OSCAL WITH D) 500-200 MG-UNIT per tablet Take 2 tablets by mouth daily.    . Cholecalciferol (VITAMIN D3) 1000 UNITS CAPS Take 1 capsule by mouth daily.     . Coral Calcium 1000 (390 CA) MG TABS Take 1 capsule by mouth daily.     Marland Kitchen dextromethorphan-guaiFENesin (MUCINEX DM) 30-600 MG per 12 hr tablet Take 1 tablet by mouth every 12 (twelve) hours as needed for cough.    . digoxin (LANOXIN) 0.125 MG tablet Take 1 tablet (0.125 mg total) by mouth daily. 30 tablet 6  . diltiazem (CARDIZEM CD) 300 MG 24 hr capsule Take 1 capsule (300 mg total) by mouth daily. 60 capsule 6  . famotidine (PEPCID) 20 MG tablet One at bedtime 30 tablet 2  . furosemide (LASIX) 40 MG tablet Take 40 mg by mouth 2 (two) times daily.     Marland Kitchen leflunomide (ARAVA) 20 MG tablet Take 20 mg by mouth daily.    Marland Kitchen LORazepam (ATIVAN) 0.5 MG tablet Take 0.5 mg by mouth 2 (two) times daily.    . Melatonin 5 MG CAPS Take 1 capsule by mouth at bedtime.    . memantine (NAMENDA XR) 7 MG  CP24 24 hr capsule Take 7 mg by mouth daily.    . memantine (NAMENDA) 10 MG tablet Take 1 tablet (10 mg total) by mouth daily. 30 tablet 5  . Multiple Vitamin (MULTIVITAMIN) tablet Take 1 tablet by mouth daily.      . pantoprazole (PROTONIX) 40 MG tablet Take 1 tablet (40 mg total) by mouth daily. Take 30-60 min before first meal of the day 30 tablet 2  . predniSONE (DELTASONE) 10 MG tablet 4 x 2 days, 2 x 2 days, 1 x 2 days, then continue regular dose (Patient taking differently: Take 10 mg by mouth daily with breakfast. ) 14 tablet 0  . psyllium (REGULOID) 0.52 G  capsule Take 0.52 g by mouth at bedtime.    . rivaroxaban (XARELTO) 20 MG TABS tablet Take 1 tablet (20 mg total) by mouth daily with supper. (Patient taking differently: Take 20 mg by mouth daily. ) 30 tablet 6   No current facility-administered medications on file prior to visit.    Past Medical History  Diagnosis Date  . Atrial flutter     a. s/p ablation for RVR  03/2009, Xarelto ongoing  . VENOUS INSUFFICIENCY, LEFT LEG   . PULMONARY FIBROSIS, INTERSTITIAL   . OBSTRUCTIVE SLEEP APNEA   . BENIGN POSITIONAL VERTIGO   . HYPERGLYCEMIA 2011  . ALLERGIC RHINITIS   . Rheumatoid arthritis(714.0)   . Chronic diastolic CHF (congestive heart failure)     a. 10/2013 Echo: EF 45-50%, mild TR, mildly dil LA/RA.  Marland Kitchen PERIPHERAL NEUROPATHY   . HYPERTENSION   . HYPERLIPIDEMIA   . DEPRESSION     BH hosp 01/2011 for SI  . Anxiety   . Morbid obesity   . Diverticulosis of colon (without mention of hemorrhage)   . Atrial fibrillation     a. chronic xarelto;  b. prev on amio->d/c'd 2/2 pulmonary fibrosis.  . Pulmonary fibrosis       Review of Systems  Constitutional: Negative for fever and chills.  Genitourinary: Positive for urgency and frequency. Negative for dysuria and difficulty urinating.  Psychiatric/Behavioral: Positive for dysphoric mood. Negative for suicidal ideas, behavioral problems, confusion, sleep disturbance and agitation. The patient is nervous/anxious.       Objective:    BP 152/88 mmHg  Pulse 76  Temp(Src) 97.9 F (36.6 C) (Oral)  Resp 20  Ht 5\' 8"  (1.727 m)  Wt 197 lb 12.8 oz (89.721 kg)  BMI 30.08 kg/m2  SpO2 96% Nursing note and vital signs reviewed.  Physical Exam  Constitutional: He is oriented to person, place, and time. He appears well-developed and well-nourished. No distress.  Cardiovascular: Normal rate, regular rhythm, normal heart sounds and intact distal pulses.   Pulmonary/Chest: Effort normal and breath sounds normal.  Neurological: He is alert and  oriented to person, place, and time.  Skin: Skin is warm and dry.  Psychiatric: His behavior is normal. Judgment and thought content normal. His mood appears anxious. He exhibits a depressed mood.       Assessment & Plan:   Problem List Items Addressed This Visit      Nervous and Auditory   Hereditary and idiopathic peripheral neuropathy    Symptoms and exam consistent with peripheral neuropathy of idiopathic origin. Previously maintained on gabapentin. Restart gabapentin. Follow up if symptoms worsen or fail to improve with addition of medication. May need possible referral to neurology for further assessment.      Relevant Medications   gabapentin (NEURONTIN) 300 MG capsule  sertraline (ZOLOFT) 50 MG tablet     Other   Depression - Primary    Symptoms and exam consistent with depression. Denies suicidal ideations, although has had previous suicidal ideations approximately 15 years ago. Started Zoloft. Refer to psychiatry for further evaluation and management. Follow-up in one month or sooner. Advised to seek emergency care and stop medication if symptoms of suicide develop after starting medication.      Relevant Medications   sertraline (ZOLOFT) 50 MG tablet   Other Relevant Orders   Ambulatory referral to Psychiatry   Urinary incontinence    Continues to experience urinary incontinence. Previously discussed use of condom catheters which patient did not follow through on. Recommend condom catheter as needed to assist with urinary incontinence. Also discussed timed voiding to decrease urge. Catheter not option at this current time. Follow-up with urology if symptoms worsen or fail to improve.

## 2014-09-27 ENCOUNTER — Telehealth: Payer: Self-pay

## 2014-09-27 NOTE — Telephone Encounter (Signed)
Form faxed back with new directions.

## 2014-09-27 NOTE — Telephone Encounter (Signed)
A nurse from the pts nursing home called and states that the pt does not need an order for condom catheter. I told her that the pt states that he is very incontinent. Nurse states that the incontinence is not that "bad".  She would like the order d/c'd. Faxed over a paper to be signed by PCP.

## 2014-09-27 NOTE — Telephone Encounter (Signed)
Addressed with condom cath prn.

## 2014-10-03 DIAGNOSIS — I509 Heart failure, unspecified: Secondary | ICD-10-CM | POA: Diagnosis not present

## 2014-10-03 DIAGNOSIS — J8410 Pulmonary fibrosis, unspecified: Secondary | ICD-10-CM | POA: Diagnosis not present

## 2014-10-03 DIAGNOSIS — I10 Essential (primary) hypertension: Secondary | ICD-10-CM | POA: Diagnosis not present

## 2014-10-03 DIAGNOSIS — R269 Unspecified abnormalities of gait and mobility: Secondary | ICD-10-CM | POA: Diagnosis not present

## 2014-10-08 ENCOUNTER — Emergency Department (HOSPITAL_COMMUNITY): Payer: Medicare Other

## 2014-10-08 ENCOUNTER — Encounter (HOSPITAL_COMMUNITY): Payer: Self-pay | Admitting: Emergency Medicine

## 2014-10-08 ENCOUNTER — Emergency Department (HOSPITAL_COMMUNITY)
Admission: EM | Admit: 2014-10-08 | Discharge: 2014-10-08 | Disposition: A | Discharge status: Skilled Nursing Facility | Payer: Medicare Other | Attending: Emergency Medicine | Admitting: Emergency Medicine

## 2014-10-08 DIAGNOSIS — Z9114 Patient's other noncompliance with medication regimen: Secondary | ICD-10-CM

## 2014-10-08 DIAGNOSIS — F419 Anxiety disorder, unspecified: Secondary | ICD-10-CM | POA: Diagnosis not present

## 2014-10-08 DIAGNOSIS — Z8709 Personal history of other diseases of the respiratory system: Secondary | ICD-10-CM | POA: Diagnosis not present

## 2014-10-08 DIAGNOSIS — I10 Essential (primary) hypertension: Secondary | ICD-10-CM | POA: Diagnosis not present

## 2014-10-08 DIAGNOSIS — G629 Polyneuropathy, unspecified: Secondary | ICD-10-CM | POA: Diagnosis not present

## 2014-10-08 DIAGNOSIS — R600 Localized edema: Secondary | ICD-10-CM | POA: Diagnosis not present

## 2014-10-08 DIAGNOSIS — I4891 Unspecified atrial fibrillation: Secondary | ICD-10-CM | POA: Insufficient documentation

## 2014-10-08 DIAGNOSIS — E785 Hyperlipidemia, unspecified: Secondary | ICD-10-CM | POA: Diagnosis not present

## 2014-10-08 DIAGNOSIS — R609 Edema, unspecified: Secondary | ICD-10-CM

## 2014-10-08 DIAGNOSIS — Z87891 Personal history of nicotine dependence: Secondary | ICD-10-CM | POA: Insufficient documentation

## 2014-10-08 DIAGNOSIS — I5032 Chronic diastolic (congestive) heart failure: Secondary | ICD-10-CM | POA: Insufficient documentation

## 2014-10-08 DIAGNOSIS — R2242 Localized swelling, mass and lump, left lower limb: Secondary | ICD-10-CM | POA: Diagnosis not present

## 2014-10-08 DIAGNOSIS — F329 Major depressive disorder, single episode, unspecified: Secondary | ICD-10-CM | POA: Insufficient documentation

## 2014-10-08 DIAGNOSIS — M069 Rheumatoid arthritis, unspecified: Secondary | ICD-10-CM | POA: Insufficient documentation

## 2014-10-08 DIAGNOSIS — F0390 Unspecified dementia without behavioral disturbance: Secondary | ICD-10-CM | POA: Insufficient documentation

## 2014-10-08 DIAGNOSIS — Z79899 Other long term (current) drug therapy: Secondary | ICD-10-CM | POA: Diagnosis not present

## 2014-10-08 DIAGNOSIS — J8489 Other specified interstitial pulmonary diseases: Secondary | ICD-10-CM | POA: Diagnosis not present

## 2014-10-08 LAB — I-STAT TROPONIN, ED: TROPONIN I, POC: 0.31 ng/mL — AB (ref 0.00–0.08)

## 2014-10-08 LAB — CBC WITH DIFFERENTIAL/PLATELET
BASOS PCT: 0 %
Basophils Absolute: 0 10*3/uL (ref 0.0–0.1)
Eosinophils Absolute: 0.1 10*3/uL (ref 0.0–0.7)
Eosinophils Relative: 1 %
HEMATOCRIT: 39.6 % (ref 39.0–52.0)
HEMOGLOBIN: 12.9 g/dL — AB (ref 13.0–17.0)
LYMPHS ABS: 0.9 10*3/uL (ref 0.7–4.0)
Lymphocytes Relative: 9 %
MCH: 32.2 pg (ref 26.0–34.0)
MCHC: 32.6 g/dL (ref 30.0–36.0)
MCV: 98.8 fL (ref 78.0–100.0)
MONO ABS: 0.8 10*3/uL (ref 0.1–1.0)
MONOS PCT: 8 %
NEUTROS ABS: 8.2 10*3/uL — AB (ref 1.7–7.7)
Neutrophils Relative %: 82 %
Platelets: 251 10*3/uL (ref 150–400)
RBC: 4.01 MIL/uL — ABNORMAL LOW (ref 4.22–5.81)
RDW: 16.4 % — AB (ref 11.5–15.5)
WBC: 10.1 10*3/uL (ref 4.0–10.5)

## 2014-10-08 LAB — COMPREHENSIVE METABOLIC PANEL
ALBUMIN: 3.7 g/dL (ref 3.5–5.0)
ALK PHOS: 80 U/L (ref 38–126)
ALT: 28 U/L (ref 17–63)
ANION GAP: 9 (ref 5–15)
AST: 24 U/L (ref 15–41)
BILIRUBIN TOTAL: 1 mg/dL (ref 0.3–1.2)
BUN: 15 mg/dL (ref 6–20)
CALCIUM: 8.9 mg/dL (ref 8.9–10.3)
CO2: 30 mmol/L (ref 22–32)
Chloride: 102 mmol/L (ref 101–111)
Creatinine, Ser: 0.9 mg/dL (ref 0.61–1.24)
GLUCOSE: 138 mg/dL — AB (ref 65–99)
POTASSIUM: 3.6 mmol/L (ref 3.5–5.1)
Sodium: 141 mmol/L (ref 135–145)
TOTAL PROTEIN: 6.5 g/dL (ref 6.5–8.1)

## 2014-10-08 LAB — BRAIN NATRIURETIC PEPTIDE: B NATRIURETIC PEPTIDE 5: 206.1 pg/mL — AB (ref 0.0–100.0)

## 2014-10-08 MED ORDER — FUROSEMIDE 40 MG PO TABS
40.0000 mg | ORAL_TABLET | Freq: Two times a day (BID) | ORAL | Status: DC
  Filled 2014-10-08: qty 1

## 2014-10-08 NOTE — ED Notes (Signed)
[facility] notifed for transport

## 2014-10-08 NOTE — ED Notes (Signed)
Pt comes to ED from [facility] place assisted memory care. Chief complain is left leg edema and incontinence. Pt has not been compliant with lasix medication at nursing home for the last 3 times as stated by EMS and [facility]. Pt has a history of CHF, dementia, GERD, UTI, muscle weakness. On assessment both legs appear swollen.

## 2014-10-08 NOTE — ED Notes (Signed)
Patient insist that ED staff took his khaki pants, belt, shoes, and wallet.  But per tech who undressed Jerlyn Ly) patient was in pajama pants and he had a glass case in his pockets.  Verlon Au

## 2014-10-08 NOTE — ED Notes (Signed)
I gave I stat critical results to MD Radford Pax

## 2014-10-08 NOTE — ED Notes (Signed)
Pt had on blue and white pajama pants and a red checked long sleeve shirt upon arrival.

## 2014-10-08 NOTE — ED Notes (Signed)
Pt states that he had 2 pairs of pants and a brown belt and wallet that is missing.  Pt arrived EMS in his pajamas with no belt or wallet.

## 2014-10-08 NOTE — Discharge Instructions (Signed)
Edema  Edema is an abnormal buildup of fluids. It is more common in your legs and thighs. Painless swelling of the feet and ankles is more likely as a person ages. It also is common in looser skin, like around your eyes.  HOME CARE   · Keep the affected body part above the level of the heart while lying down.  · Do not sit still or stand for a long time.  · Do not put anything right under your knees when you lie down.  · Do not wear tight clothes on your upper legs.  · Exercise your legs to help the puffiness (swelling) go down.  · Wear elastic bandages or support stockings as told by your doctor.  · A low-salt diet may help lessen the puffiness.  · Only take medicine as told by your doctor.  GET HELP IF:  · Treatment is not working.  · You have heart, liver, or kidney disease and notice that your skin looks puffy or shiny.  · You have puffiness in your legs that does not get better when you raise your legs.  · You have sudden weight gain for no reason.  GET HELP RIGHT AWAY IF:   · You have shortness of breath or chest pain.  · You cannot breathe when you lie down.  · You have pain, redness, or warmth in the areas that are puffy.  · You have heart, liver, or kidney disease and get edema all of a sudden.  · You have a fever and your symptoms get worse all of a sudden.  MAKE SURE YOU:   · Understand these instructions.  · Will watch your condition.  · Will get help right away if you are not doing well or get worse.  Document Released: 06/24/2007 Document Revised: 01/10/2013 Document Reviewed: 10/28/2012  [employer]® Patient Information ©2015 [employer]. This information is not intended to replace advice given to you by your health care provider. Make sure you discuss any questions you have with your health care provider.

## 2014-10-08 NOTE — ED Notes (Signed)
Ems reports bp 150/90 98 irregular pulse  SAT 92 on rm air  Pt uses 02 at night

## 2014-10-08 NOTE — ED Notes (Signed)
Bed: WA02 Expected date:  Expected time:  Means of arrival:  Comments: EMS- 82yo M, leg swelling

## 2014-10-08 NOTE — ED Provider Notes (Signed)
CSN: [id number]     Arrival date & time 10/08/14  1834 History   First MD Initiated Contact with Patient 10/08/14 1856     Chief Complaint  Patient presents with  . Leg Swelling      HPI Pt comes to ED from [facility] place assisted memory care. Chief complain is left leg edema and incontinence. Pt has not been compliant with lasix medication at nursing home for the last 3 times as stated by EMS and [facility]. Pt has a history of CHF, dementia, GERD, UTI, muscle weakness. On assessment both legs appear swollen. Past Medical History  Diagnosis Date  . Atrial flutter     a. s/p ablation for RVR  03/2009, Xarelto ongoing  . VENOUS INSUFFICIENCY, LEFT LEG   . PULMONARY FIBROSIS, INTERSTITIAL   . OBSTRUCTIVE SLEEP APNEA   . BENIGN POSITIONAL VERTIGO   . HYPERGLYCEMIA 2011  . ALLERGIC RHINITIS   . Rheumatoid arthritis(714.0)   . Chronic diastolic CHF (congestive heart failure)     a. 10/2013 Echo: EF 45-50%, mild TR, mildly dil LA/RA.  Marland Kitchen PERIPHERAL NEUROPATHY   . HYPERTENSION   . HYPERLIPIDEMIA   . DEPRESSION     [facility] hosp 01/2011 for SI  . Anxiety   . Morbid obesity   . Diverticulosis of colon (without mention of hemorrhage)   . Atrial fibrillation     a. chronic xarelto;  b. prev on amio->d/c'd 2/2 pulmonary fibrosis.  . Pulmonary fibrosis    Past Surgical History  Procedure Laterality Date  . 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 Shoulder surgery Right 09/2009  . Varicose vein surgery      Left Leg  . Radiofrequency ablation    . Colonoscopy w/ biopsies     Family History  Problem Relation Age of Onset  . Heart attack Father 65  . Other Mother 22    Old age  . Colon cancer Neg Hx    Social History  Substance Use Topics  . Smoking status: Former Smoker -- 1.00 packs/day for 3 years    Types: Cigarettes    Quit date: 01/20/2000  . Smokeless tobacco: Never Used     Comment: Single, lives alone. 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 His family contact would be his sister [name] [phone]  . Alcohol Use: No     Comment: Former    Review of Systems  Unable to perform ROS: Dementia      Allergies  Codeine and Methotrexate  Home Medications   Prior to Admission medications   Medication Sig Start Date End Date Taking? Authorizing Provider  busPIRone (BUSPAR) 5 MG tablet Take 7.5 mg by mouth 2 (two) times daily.   Yes Historical Provider, MD  Cholecalciferol (VITAMIN D3) 1000 UNITS CAPS Take 1 capsule by mouth daily.    Yes Historical Provider, MD  diltiazem (CARDIZEM CD) 300 MG 24 hr capsule Take 1 capsule (300 mg total) by mouth daily. 12/01/13  Yes Dwana Melena, PA-C  famotidine (PEPCID) 20 MG tablet One at bedtime 05/24/14  Yes Nyoka Cowden, MD  furosemide (LASIX) 40 MG tablet Take 40 mg by mouth 2 (two) times daily.  11/10/13  Yes Historical Provider, MD  gabapentin (NEURONTIN) 300 MG capsule Take 1 capsule (300 mg total) by mouth 3 (three) times daily. 09/26/14  Yes Veryl Speak, FNP  leflunomide (  ARAVA) 20 MG tablet Take 20 mg by mouth daily.  10/08/14  Yes Historical Provider, MD  LORazepam (ATIVAN) 0.5 MG tablet Take 0.5 mg by mouth 2 (two) times daily.   Yes Historical Provider, MD  Melatonin 5 MG CAPS Take 1 capsule by mouth at bedtime.   Yes Historical Provider, MD  memantine (NAMENDA XR) 14 MG CP24 24 hr capsule Take 14 mg by mouth daily.   Yes Historical Provider, MD  pantoprazole (PROTONIX) 40 MG tablet Take 1 tablet (40 mg total) by mouth daily. Take 30-60 min before first meal of the day 05/24/14  Yes Nyoka Cowden, MD  predniSONE (DELTASONE) 10 MG tablet 4 x 2 days, 2 x 2 days, 1 x 2 days, then continue regular dose Patient taking differently: Take 10 mg by mouth daily with breakfast. 4 x 2 days, 2 x 2 days, 1 x 2 days, then continue regular dose 05/24/14  Yes Nyoka Cowden, MD  psyllium (REGULOID) 0.52 G capsule Take 0.52 g by mouth at bedtime.    Yes Historical Provider, MD  rivaroxaban (XARELTO) 20 MG TABS tablet Take 1 tablet (20 mg total) by mouth daily with supper. Patient taking differently: Take 20 mg by mouth daily.  12/01/13  Yes Dwana Melena, PA-C  sertraline (ZOLOFT) 50 MG tablet Take 1 tablet (50 mg total) by mouth daily. 09/26/14  Yes Veryl Speak, FNP  dextromethorphan-guaiFENesin [facility]) 30-600 MG per 12 hr tablet Take 1 tablet by mouth every 12 (twelve) hours as needed for cough.    Historical Provider, MD  digoxin (LANOXIN) 0.125 MG tablet Take 1 tablet (0.125 mg total) by mouth daily. Patient not taking: Reported on 10/08/2014 12/01/13   Dwana Melena, PA-C  memantine (NAMENDA) 10 MG tablet Take 1 tablet (10 mg total) by mouth daily. Patient not taking: Reported on 10/08/2014 02/12/14   Newt Lukes, MD   BP 132/63 mmHg  Pulse 99  Temp(Src) 98.4 F (36.9 C) (Oral)  Resp 16  SpO2 93% Physical Exam  Constitutional: He appears well-developed and well-nourished. No distress.  HENT:  Head: Normocephalic and atraumatic.  Eyes: Pupils are equal, round, and reactive to light.  Neck: Normal range of motion.  Cardiovascular: Normal rate and intact distal pulses.   Pulmonary/Chest: No respiratory distress.  Abdominal: Normal appearance. He exhibits no distension.  Musculoskeletal: Normal range of motion. He exhibits edema.       Right upper leg: He exhibits edema.       Left upper leg: He exhibits edema.  Neurological: No cranial nerve deficit.  Skin: Skin is warm and dry. No rash noted.  Psychiatric: He has a normal mood and affect. His behavior is normal.  Nursing note and vitals reviewed.   ED Course  Procedures (including critical care time) Medications  furosemide (LASIX) tablet 40 mg (not administered)    Labs Review Labs Reviewed  CBC WITH DIFFERENTIAL/PLATELET - Abnormal; Notable for the following:    RBC 4.01 (*)    Hemoglobin 12.9 (*)    RDW 16.4 (*)    Neutro Abs 8.2 (*)    All other  components within normal limits  COMPREHENSIVE METABOLIC PANEL - Abnormal; Notable for the following:    Glucose, Bld 138 (*)    All other components within normal limits  BRAIN NATRIURETIC PEPTIDE - Abnormal; Notable for the following:    B Natriuretic Peptide 206.1 (*)    All other components within normal limits  I-STAT TROPOININ, ED -  Abnormal; Notable for the following:    Troponin i, poc 0.31 (*)    All other components within normal limits    Imaging Review Dg Chest 2 View  10/08/2014   CLINICAL DATA:  Bilateral leg swelling 3 days left greater than right. History pulmonary fibrosis.  EXAM: CHEST  2 VIEW  COMPARISON:  05/24/2014  FINDINGS: Exam demonstrates low lung volumes with bilateral coarse interstitial changes compatible known pulmonary fibrosis. No focal consolidation, effusion or pneumothorax. Mild stable cardiomegaly. Degenerative changes of the spine.  IMPRESSION: No active cardiopulmonary disease.  Low lung volumes with coarse interstitial changes compatible with known pulmonary fibrosis without significant change.   Electronically Signed   By: Elberta Fortis M.D.   On: 10/08/2014 20:32   I have personally reviewed and evaluated these images and lab results as part of my medical decision-making.   EKG Interpretation   Date/Time:  Monday October 08 2014 19:11:21 EDT Ventricular Rate:  82 PR Interval:    QRS Duration: 101 QT Interval:  376 QTC Calculation: 439 R Axis:   -66 Text Interpretation:  Atrial fibrillation Paired ventricular premature  complexes Aberrant conduction of SV complex(es) Left anterior fascicular  block Anterior infarct, old Repol abnrm suggests ischemia, lateral leads  Baseline wander in lead(s) III Confirmed by BEATON  MD, ROBERT ([id number]) on  10/08/2014 9:18:10 PM      MDM   Final diagnoses:  Peripheral edema  History of medication noncompliance        Nelva Nay, MD 10/08/14 2124

## 2014-10-09 DIAGNOSIS — R609 Edema, unspecified: Secondary | ICD-10-CM | POA: Diagnosis not present

## 2014-10-10 DIAGNOSIS — R32 Unspecified urinary incontinence: Secondary | ICD-10-CM | POA: Diagnosis not present

## 2014-10-19 DIAGNOSIS — I10 Essential (primary) hypertension: Secondary | ICD-10-CM | POA: Diagnosis not present

## 2014-10-26 DIAGNOSIS — F0281 Dementia in other diseases classified elsewhere with behavioral disturbance: Secondary | ICD-10-CM | POA: Diagnosis not present

## 2014-10-26 DIAGNOSIS — M0689 Other specified rheumatoid arthritis, multiple sites: Secondary | ICD-10-CM | POA: Diagnosis not present

## 2014-10-26 DIAGNOSIS — G309 Alzheimer's disease, unspecified: Secondary | ICD-10-CM | POA: Diagnosis not present

## 2014-10-26 DIAGNOSIS — M1990 Unspecified osteoarthritis, unspecified site: Secondary | ICD-10-CM | POA: Diagnosis not present

## 2014-10-26 DIAGNOSIS — G609 Hereditary and idiopathic neuropathy, unspecified: Secondary | ICD-10-CM | POA: Diagnosis not present

## 2014-10-29 DIAGNOSIS — G609 Hereditary and idiopathic neuropathy, unspecified: Secondary | ICD-10-CM | POA: Diagnosis not present

## 2014-10-29 DIAGNOSIS — M0689 Other specified rheumatoid arthritis, multiple sites: Secondary | ICD-10-CM | POA: Diagnosis not present

## 2014-10-29 DIAGNOSIS — G309 Alzheimer's disease, unspecified: Secondary | ICD-10-CM | POA: Diagnosis not present

## 2014-10-29 DIAGNOSIS — M1990 Unspecified osteoarthritis, unspecified site: Secondary | ICD-10-CM | POA: Diagnosis not present

## 2014-10-29 DIAGNOSIS — F0281 Dementia in other diseases classified elsewhere with behavioral disturbance: Secondary | ICD-10-CM | POA: Diagnosis not present

## 2014-11-01 ENCOUNTER — Ambulatory Visit: Payer: Medicare Other | Admitting: Family

## 2014-11-01 DIAGNOSIS — Z0289 Encounter for other administrative examinations: Secondary | ICD-10-CM

## 2014-11-01 DIAGNOSIS — G609 Hereditary and idiopathic neuropathy, unspecified: Secondary | ICD-10-CM | POA: Diagnosis not present

## 2014-11-01 DIAGNOSIS — F0281 Dementia in other diseases classified elsewhere with behavioral disturbance: Secondary | ICD-10-CM | POA: Diagnosis not present

## 2014-11-01 DIAGNOSIS — G309 Alzheimer's disease, unspecified: Secondary | ICD-10-CM | POA: Diagnosis not present

## 2014-11-01 DIAGNOSIS — M1990 Unspecified osteoarthritis, unspecified site: Secondary | ICD-10-CM | POA: Diagnosis not present

## 2014-11-01 DIAGNOSIS — M0689 Other specified rheumatoid arthritis, multiple sites: Secondary | ICD-10-CM | POA: Diagnosis not present

## 2014-11-02 DIAGNOSIS — M0689 Other specified rheumatoid arthritis, multiple sites: Secondary | ICD-10-CM | POA: Diagnosis not present

## 2014-11-02 DIAGNOSIS — F0281 Dementia in other diseases classified elsewhere with behavioral disturbance: Secondary | ICD-10-CM | POA: Diagnosis not present

## 2014-11-02 DIAGNOSIS — G609 Hereditary and idiopathic neuropathy, unspecified: Secondary | ICD-10-CM | POA: Diagnosis not present

## 2014-11-02 DIAGNOSIS — M1990 Unspecified osteoarthritis, unspecified site: Secondary | ICD-10-CM | POA: Diagnosis not present

## 2014-11-02 DIAGNOSIS — G309 Alzheimer's disease, unspecified: Secondary | ICD-10-CM | POA: Diagnosis not present

## 2014-11-06 ENCOUNTER — Encounter (HOSPITAL_COMMUNITY): Payer: Self-pay | Admitting: *Deleted

## 2014-11-06 ENCOUNTER — Inpatient Hospital Stay (HOSPITAL_COMMUNITY)
Admission: EM | Admit: 2014-11-06 | Discharge: 2014-11-17 | DRG: 291 | Disposition: A | Discharge status: Hospice | Payer: Medicare Other | Attending: Internal Medicine | Admitting: Internal Medicine

## 2014-11-06 ENCOUNTER — Emergency Department (HOSPITAL_COMMUNITY): Payer: Medicare Other

## 2014-11-06 DIAGNOSIS — R627 Adult failure to thrive: Secondary | ICD-10-CM | POA: Diagnosis not present

## 2014-11-06 DIAGNOSIS — Z7901 Long term (current) use of anticoagulants: Secondary | ICD-10-CM | POA: Diagnosis not present

## 2014-11-06 DIAGNOSIS — I872 Venous insufficiency (chronic) (peripheral): Secondary | ICD-10-CM | POA: Diagnosis present

## 2014-11-06 DIAGNOSIS — N179 Acute kidney failure, unspecified: Secondary | ICD-10-CM | POA: Diagnosis not present

## 2014-11-06 DIAGNOSIS — M7989 Other specified soft tissue disorders: Secondary | ICD-10-CM | POA: Diagnosis present

## 2014-11-06 DIAGNOSIS — I509 Heart failure, unspecified: Secondary | ICD-10-CM | POA: Diagnosis not present

## 2014-11-06 DIAGNOSIS — I10 Essential (primary) hypertension: Secondary | ICD-10-CM | POA: Diagnosis not present

## 2014-11-06 DIAGNOSIS — J9601 Acute respiratory failure with hypoxia: Secondary | ICD-10-CM | POA: Diagnosis present

## 2014-11-06 DIAGNOSIS — Z87891 Personal history of nicotine dependence: Secondary | ICD-10-CM | POA: Diagnosis not present

## 2014-11-06 DIAGNOSIS — R0902 Hypoxemia: Secondary | ICD-10-CM | POA: Diagnosis not present

## 2014-11-06 DIAGNOSIS — J9621 Acute and chronic respiratory failure with hypoxia: Secondary | ICD-10-CM | POA: Diagnosis not present

## 2014-11-06 DIAGNOSIS — Z905 Acquired absence of kidney: Secondary | ICD-10-CM | POA: Diagnosis not present

## 2014-11-06 DIAGNOSIS — J84112 Idiopathic pulmonary fibrosis: Secondary | ICD-10-CM | POA: Diagnosis not present

## 2014-11-06 DIAGNOSIS — G629 Polyneuropathy, unspecified: Secondary | ICD-10-CM | POA: Diagnosis present

## 2014-11-06 DIAGNOSIS — J189 Pneumonia, unspecified organism: Secondary | ICD-10-CM | POA: Diagnosis present

## 2014-11-06 DIAGNOSIS — Y95 Nosocomial condition: Secondary | ICD-10-CM | POA: Diagnosis present

## 2014-11-06 DIAGNOSIS — Z8249 Family history of ischemic heart disease and other diseases of the circulatory system: Secondary | ICD-10-CM | POA: Diagnosis not present

## 2014-11-06 DIAGNOSIS — I272 Other secondary pulmonary hypertension: Secondary | ICD-10-CM | POA: Diagnosis not present

## 2014-11-06 DIAGNOSIS — I255 Ischemic cardiomyopathy: Secondary | ICD-10-CM | POA: Diagnosis not present

## 2014-11-06 DIAGNOSIS — F419 Anxiety disorder, unspecified: Secondary | ICD-10-CM | POA: Diagnosis not present

## 2014-11-06 DIAGNOSIS — R532 Functional quadriplegia: Secondary | ICD-10-CM | POA: Diagnosis not present

## 2014-11-06 DIAGNOSIS — G9341 Metabolic encephalopathy: Secondary | ICD-10-CM | POA: Diagnosis not present

## 2014-11-06 DIAGNOSIS — R32 Unspecified urinary incontinence: Secondary | ICD-10-CM | POA: Diagnosis not present

## 2014-11-06 DIAGNOSIS — R778 Other specified abnormalities of plasma proteins: Secondary | ICD-10-CM | POA: Insufficient documentation

## 2014-11-06 DIAGNOSIS — Z515 Encounter for palliative care: Secondary | ICD-10-CM | POA: Insufficient documentation

## 2014-11-06 DIAGNOSIS — I5021 Acute systolic (congestive) heart failure: Secondary | ICD-10-CM | POA: Diagnosis not present

## 2014-11-06 DIAGNOSIS — Z7189 Other specified counseling: Secondary | ICD-10-CM | POA: Diagnosis not present

## 2014-11-06 DIAGNOSIS — E870 Hyperosmolality and hypernatremia: Secondary | ICD-10-CM | POA: Diagnosis not present

## 2014-11-06 DIAGNOSIS — I482 Chronic atrial fibrillation: Secondary | ICD-10-CM | POA: Diagnosis present

## 2014-11-06 DIAGNOSIS — Z66 Do not resuscitate: Secondary | ICD-10-CM | POA: Diagnosis not present

## 2014-11-06 DIAGNOSIS — I4891 Unspecified atrial fibrillation: Secondary | ICD-10-CM | POA: Diagnosis not present

## 2014-11-06 DIAGNOSIS — A047 Enterocolitis due to Clostridium difficile: Secondary | ICD-10-CM | POA: Diagnosis not present

## 2014-11-06 DIAGNOSIS — J449 Chronic obstructive pulmonary disease, unspecified: Secondary | ICD-10-CM | POA: Diagnosis not present

## 2014-11-06 DIAGNOSIS — R069 Unspecified abnormalities of breathing: Secondary | ICD-10-CM | POA: Diagnosis not present

## 2014-11-06 DIAGNOSIS — J9690 Respiratory failure, unspecified, unspecified whether with hypoxia or hypercapnia: Secondary | ICD-10-CM | POA: Diagnosis present

## 2014-11-06 DIAGNOSIS — J9600 Acute respiratory failure, unspecified whether with hypoxia or hypercapnia: Secondary | ICD-10-CM

## 2014-11-06 DIAGNOSIS — Z4659 Encounter for fitting and adjustment of other gastrointestinal appliance and device: Secondary | ICD-10-CM

## 2014-11-06 DIAGNOSIS — Z4682 Encounter for fitting and adjustment of non-vascular catheter: Secondary | ICD-10-CM | POA: Diagnosis not present

## 2014-11-06 DIAGNOSIS — G4733 Obstructive sleep apnea (adult) (pediatric): Secondary | ICD-10-CM | POA: Diagnosis present

## 2014-11-06 DIAGNOSIS — I471 Supraventricular tachycardia: Secondary | ICD-10-CM | POA: Diagnosis present

## 2014-11-06 DIAGNOSIS — I472 Ventricular tachycardia: Secondary | ICD-10-CM | POA: Diagnosis present

## 2014-11-06 DIAGNOSIS — R001 Bradycardia, unspecified: Secondary | ICD-10-CM | POA: Diagnosis not present

## 2014-11-06 DIAGNOSIS — Z79899 Other long term (current) drug therapy: Secondary | ICD-10-CM

## 2014-11-06 DIAGNOSIS — I5023 Acute on chronic systolic (congestive) heart failure: Principal | ICD-10-CM | POA: Diagnosis present

## 2014-11-06 DIAGNOSIS — E876 Hypokalemia: Secondary | ICD-10-CM | POA: Diagnosis present

## 2014-11-06 DIAGNOSIS — E785 Hyperlipidemia, unspecified: Secondary | ICD-10-CM | POA: Diagnosis present

## 2014-11-06 DIAGNOSIS — I110 Hypertensive heart disease with heart failure: Secondary | ICD-10-CM | POA: Diagnosis not present

## 2014-11-06 DIAGNOSIS — R7989 Other specified abnormal findings of blood chemistry: Secondary | ICD-10-CM | POA: Diagnosis not present

## 2014-11-06 DIAGNOSIS — I481 Persistent atrial fibrillation: Secondary | ICD-10-CM

## 2014-11-06 DIAGNOSIS — R0600 Dyspnea, unspecified: Secondary | ICD-10-CM | POA: Diagnosis not present

## 2014-11-06 DIAGNOSIS — I484 Atypical atrial flutter: Secondary | ICD-10-CM | POA: Diagnosis not present

## 2014-11-06 DIAGNOSIS — I4892 Unspecified atrial flutter: Secondary | ICD-10-CM | POA: Diagnosis not present

## 2014-11-06 DIAGNOSIS — J811 Chronic pulmonary edema: Secondary | ICD-10-CM | POA: Diagnosis not present

## 2014-11-06 DIAGNOSIS — I483 Typical atrial flutter: Secondary | ICD-10-CM | POA: Diagnosis not present

## 2014-11-06 DIAGNOSIS — R0602 Shortness of breath: Secondary | ICD-10-CM | POA: Diagnosis not present

## 2014-11-06 LAB — COMPREHENSIVE METABOLIC PANEL
ALT: 37 U/L (ref 17–63)
AST: 27 U/L (ref 15–41)
Albumin: 3.5 g/dL (ref 3.5–5.0)
Alkaline Phosphatase: 80 U/L (ref 38–126)
Anion gap: 9 (ref 5–15)
BUN: 16 mg/dL (ref 6–20)
CHLORIDE: 103 mmol/L (ref 101–111)
CO2: 29 mmol/L (ref 22–32)
CREATININE: 0.79 mg/dL (ref 0.61–1.24)
Calcium: 8.5 mg/dL — ABNORMAL LOW (ref 8.9–10.3)
GFR calc non Af Amer: >60 mL/min (ref >60)
GLUCOSE: 126 mg/dL — AB (ref 65–99)
Potassium: 3.8 mmol/L (ref 3.5–5.1)
Sodium: 141 mmol/L (ref 135–145)
Total Bilirubin: 1.3 mg/dL — ABNORMAL HIGH (ref 0.3–1.2)
Total Protein: 6.7 g/dL (ref 6.5–8.1)

## 2014-11-06 LAB — CBC WITH DIFFERENTIAL/PLATELET
Basophils Absolute: 0 10*3/uL (ref 0.0–0.1)
Basophils Relative: 0 %
EOS PCT: 1 %
Eosinophils Absolute: 0.1 10*3/uL (ref 0.0–0.7)
HCT: 40.3 % (ref 39.0–52.0)
Hemoglobin: 13.2 g/dL (ref 13.0–17.0)
LYMPHS ABS: 0.4 10*3/uL — AB (ref 0.7–4.0)
LYMPHS PCT: 3 %
MCH: 32.6 pg (ref 26.0–34.0)
MCHC: 32.8 g/dL (ref 30.0–36.0)
MCV: 99.5 fL (ref 78.0–100.0)
MONO ABS: 0.8 10*3/uL (ref 0.1–1.0)
MONOS PCT: 6 %
Neutro Abs: 12.4 10*3/uL — ABNORMAL HIGH (ref 1.7–7.7)
Neutrophils Relative %: 90 %
PLATELETS: 253 10*3/uL (ref 150–400)
RBC: 4.05 MIL/uL — AB (ref 4.22–5.81)
RDW: 17.1 % — AB (ref 11.5–15.5)
WBC: 13.8 10*3/uL — ABNORMAL HIGH (ref 4.0–10.5)

## 2014-11-06 LAB — I-STAT TROPONIN, ED: Troponin i, poc: 0.37 ng/mL (ref 0.00–0.08)

## 2014-11-06 LAB — I-STAT CG4 LACTIC ACID, ED: Lactic Acid, Venous: 1.21 mmol/L (ref 0.5–2.0)

## 2014-11-06 LAB — TROPONIN I
Troponin I: 0.33 ng/mL — ABNORMAL HIGH (ref <0.031)
Troponin I: 0.34 ng/mL — ABNORMAL HIGH (ref <0.031)

## 2014-11-06 LAB — MRSA PCR SCREENING: MRSA BY PCR: NEGATIVE

## 2014-11-06 LAB — BRAIN NATRIURETIC PEPTIDE: B NATRIURETIC PEPTIDE 5: 240.2 pg/mL — AB (ref 0.0–100.0)

## 2014-11-06 MED ORDER — SODIUM CHLORIDE 0.9 % IJ SOLN: 3.0000 mL | INTRAMUSCULAR | Status: DC | PRN

## 2014-11-06 MED ORDER — QUETIAPINE FUMARATE 50 MG PO TABS
25.0000 mg | ORAL_TABLET | Freq: Two times a day (BID) | ORAL | Status: DC
  Administered 2014-11-07 – 2014-11-13 (×12): 25 mg via ORAL
  Filled 2014-11-06 (×12): qty 1

## 2014-11-06 MED ORDER — LORAZEPAM 0.5 MG PO TABS: 0.5000 mg | ORAL_TABLET | Freq: Two times a day (BID) | ORAL | Status: DC

## 2014-11-06 MED ORDER — FUROSEMIDE 10 MG/ML IJ SOLN
60.0000 mg | Freq: Two times a day (BID) | INTRAMUSCULAR | Status: DC
  Administered 2014-11-07: 60 mg via INTRAVENOUS
  Filled 2014-11-06: qty 6

## 2014-11-06 MED ORDER — IOHEXOL 350 MG/ML SOLN
100.0000 mL | Freq: Once | INTRAVENOUS | Status: AC | PRN
  Administered 2014-11-06: 100 mL via INTRAVENOUS

## 2014-11-06 MED ORDER — ONDANSETRON HCL 4 MG/2ML IJ SOLN: 4.0000 mg | Freq: Four times a day (QID) | INTRAMUSCULAR | Status: DC | PRN

## 2014-11-06 MED ORDER — SERTRALINE HCL 50 MG PO TABS
50.0000 mg | ORAL_TABLET | Freq: Every day | ORAL | Status: DC
  Administered 2014-11-08 – 2014-11-13 (×6): 50 mg via ORAL
  Filled 2014-11-06 (×6): qty 1

## 2014-11-06 MED ORDER — RIVAROXABAN 20 MG PO TABS
20.0000 mg | ORAL_TABLET | Freq: Every day | ORAL | Status: DC
  Administered 2014-11-08: 20 mg via ORAL
  Filled 2014-11-06: qty 1

## 2014-11-06 MED ORDER — METHYLPREDNISOLONE SODIUM SUCC 125 MG IJ SOLR
60.0000 mg | Freq: Four times a day (QID) | INTRAMUSCULAR | Status: DC
  Administered 2014-11-06 – 2014-11-07 (×3): 60 mg via INTRAVENOUS
  Filled 2014-11-06 (×3): qty 2

## 2014-11-06 MED ORDER — SODIUM CHLORIDE 0.9 % IV SOLN
250.0000 mL | INTRAVENOUS | Status: DC | PRN
  Administered 2014-11-09: 250 mL via INTRAVENOUS

## 2014-11-06 MED ORDER — DILTIAZEM HCL ER COATED BEADS 180 MG PO CP24: 300.0000 mg | ORAL_CAPSULE | Freq: Every day | ORAL | Status: DC

## 2014-11-06 MED ORDER — LORAZEPAM 2 MG/ML IJ SOLN
0.5000 mg | Freq: Once | INTRAMUSCULAR | Status: AC
  Administered 2014-11-06: 0.5 mg via INTRAVENOUS

## 2014-11-06 MED ORDER — DEXTROSE 5 % IV SOLN
1.0000 g | Freq: Three times a day (TID) | INTRAVENOUS | Status: DC
  Administered 2014-11-07 – 2014-11-09 (×8): 1 g via INTRAVENOUS
  Filled 2014-11-06 (×9): qty 1

## 2014-11-06 MED ORDER — BUSPIRONE HCL 15 MG PO TABS
7.5000 mg | ORAL_TABLET | Freq: Three times a day (TID) | ORAL | Status: DC
  Administered 2014-11-07 – 2014-11-13 (×17): 7.5 mg via ORAL
  Filled 2014-11-06 (×25): qty 1

## 2014-11-06 MED ORDER — GABAPENTIN 300 MG PO CAPS
300.0000 mg | ORAL_CAPSULE | Freq: Three times a day (TID) | ORAL | Status: DC
  Administered 2014-11-07 – 2014-11-13 (×17): 300 mg via ORAL
  Filled 2014-11-06 (×18): qty 1

## 2014-11-06 MED ORDER — PIPERACILLIN-TAZOBACTAM 3.375 G IVPB: 3.3750 g | Freq: Three times a day (TID) | INTRAVENOUS | Status: DC

## 2014-11-06 MED ORDER — ACETAMINOPHEN 325 MG PO TABS: 650.0000 mg | ORAL_TABLET | ORAL | Status: DC | PRN

## 2014-11-06 MED ORDER — LEVALBUTEROL HCL 1.25 MG/0.5ML IN NEBU
1.2500 mg | INHALATION_SOLUTION | Freq: Four times a day (QID) | RESPIRATORY_TRACT | Status: DC | PRN
  Administered 2014-11-08 – 2014-11-11 (×3): 1.25 mg via RESPIRATORY_TRACT
  Filled 2014-11-06 (×4): qty 0.5

## 2014-11-06 MED ORDER — ASPIRIN 81 MG PO CHEW
324.0000 mg | CHEWABLE_TABLET | Freq: Once | ORAL | Status: AC
  Administered 2014-11-06: 324 mg via ORAL
  Filled 2014-11-06: qty 4

## 2014-11-06 MED ORDER — VANCOMYCIN HCL IN DEXTROSE 1-5 GM/200ML-% IV SOLN
1000.0000 mg | Freq: Two times a day (BID) | INTRAVENOUS | Status: DC
  Administered 2014-11-07 – 2014-11-08 (×3): 1000 mg via INTRAVENOUS
  Filled 2014-11-06 (×3): qty 200

## 2014-11-06 MED ORDER — METHYLPREDNISOLONE SODIUM SUCC 125 MG IJ SOLR
125.0000 mg | Freq: Once | INTRAMUSCULAR | Status: AC
  Administered 2014-11-06: 125 mg via INTRAVENOUS
  Filled 2014-11-06: qty 2

## 2014-11-06 MED ORDER — VANCOMYCIN HCL IN DEXTROSE 1-5 GM/200ML-% IV SOLN
1000.0000 mg | Freq: Once | INTRAVENOUS | Status: AC
  Administered 2014-11-06: 1000 mg via INTRAVENOUS
  Filled 2014-11-06: qty 200

## 2014-11-06 MED ORDER — FUROSEMIDE 10 MG/ML IJ SOLN
80.0000 mg | Freq: Once | INTRAMUSCULAR | Status: AC
  Administered 2014-11-06: 80 mg via INTRAVENOUS
  Filled 2014-11-06: qty 8

## 2014-11-06 MED ORDER — PANTOPRAZOLE SODIUM 40 MG PO TBEC: 40.0000 mg | DELAYED_RELEASE_TABLET | Freq: Every day | ORAL | Status: DC

## 2014-11-06 MED ORDER — DEXTROSE 5 % IV SOLN
1.0000 g | Freq: Once | INTRAVENOUS | Status: AC
  Administered 2014-11-06: 1 g via INTRAVENOUS
  Filled 2014-11-06 (×2): qty 1

## 2014-11-06 MED ORDER — DIGOXIN 125 MCG PO TABS
0.1250 mg | ORAL_TABLET | Freq: Every day | ORAL | Status: DC
  Administered 2014-11-08 – 2014-11-13 (×6): 0.125 mg via ORAL
  Filled 2014-11-06 (×6): qty 1

## 2014-11-06 MED ORDER — SODIUM CHLORIDE 0.9 % IJ SOLN
3.0000 mL | Freq: Two times a day (BID) | INTRAMUSCULAR | Status: DC
  Administered 2014-11-06 – 2014-11-17 (×13): 3 mL via INTRAVENOUS

## 2014-11-06 MED ORDER — LORAZEPAM 2 MG/ML IJ SOLN
INTRAMUSCULAR | Status: AC
  Filled 2014-11-06: qty 1

## 2014-11-06 NOTE — ED Notes (Signed)
Report given to [facility]

## 2014-11-06 NOTE — H&P (Signed)
[facility] History and Physical  [name] EVO:[id number] DOB: 02-05-1931 DOA: 11/06/2014  Referring physician:  PCP: [name], MD   Chief Complaint: Shortness of breath  HPI: [name] is a 81 y.o. male with a past medical history of systolic congestive heart failure having his last transthoracic echocardiogram performed on 02/21/2014 that showed ejection fraction 40-45%, history of atrial fibrillation/atrial flutter on chronic anticoagulation with Xarelto, pulmonary fibrosis having previous hospitalizations for acute decompensated congestive heart failure presented to the emergency department with complaints of  shortness of breath associated with worsening bilateral extremity edema becoming progressively worse over the past several days. He complains of associated chest tightness, orthopnea, having a functional decline requiring greater assistance with activities of daily living. Lab work in the emergency room included a troponin that was elevated at 0.37. EKG showed atrial fibrillation with left anterior fascicular block. He had a CT scan of chest with IV contrast in the emergency department that was negative for pulmonary embolism however radiology reported acute pulmonary edema.                                                                        Review of Systems:  Constitutional:  No weight loss, night sweats, Fevers, chills, positive for fatigue, generalized weakness poor tolerance to physical exertion.  HEENT:  No headaches, Difficulty swallowing,Tooth/dental problems,Sore throat,  No sneezing, itching, ear ache, nasal congestion, post nasal drip,  Cardio-vascular:  Positive for chest tightness, Orthopnea, PND, swelling in lower extremities, anasarca, dizziness, palpitations  GI:  No heartburn, indigestion, abdominal pain, nausea, vomiting, diarrhea, change in bowel habits, loss of appetite  Resp:  Positive for shortness of breath with exertion or at  rest. No excess mucus, no productive cough, No non-productive cough, No coughing up of blood.No change in color of mucus.No wheezing.No chest wall deformity  Skin:  no rash or lesions.  GU:  no dysuria, change in color of urine, no urgency or frequency. No flank pain.  Musculoskeletal:  No joint pain or swelling. No decreased range of motion. No back pain.  Psych:  No change in mood or affect. No depression or anxiety. No memory loss.   Past Medical History  Diagnosis Date  . Atrial flutter (HCC)     a. s/p ablation for RVR  03/2009, Xarelto ongoing  . VENOUS INSUFFICIENCY, LEFT LEG   . PULMONARY FIBROSIS, INTERSTITIAL   . OBSTRUCTIVE SLEEP APNEA   . BENIGN POSITIONAL VERTIGO   . HYPERGLYCEMIA 2011  . ALLERGIC RHINITIS   . Rheumatoid arthritis(714.0)   . Chronic diastolic CHF (congestive heart failure) (HCC)     a. 10/2013 Echo: EF 45-50%, mild TR, mildly dil LA/RA.  Marland Kitchen PERIPHERAL NEUROPATHY   . HYPERTENSION   . HYPERLIPIDEMIA   . DEPRESSION     BH hosp 01/2011 for SI  . Anxiety   . Morbid obesity (HCC)   . Diverticulosis of colon (without mention of hemorrhage)   . Atrial fibrillation (HCC)     a. chronic xarelto;  b. prev on amio->d/c'd 2/2 pulmonary fibrosis.  . Pulmonary fibrosis [facility])    Past Surgical History  Procedure Laterality Date  . Nephrectomy  1963    secondary to blood tumor that was  benign vein stripped out of the left leg  . Shoulder surgery Right 09/2009  . Varicose vein surgery      Left Leg  . Radiofrequency ablation    . Colonoscopy w/ biopsies     Social History:  reports that he quit smoking about 14 years ago. His smoking use included Cigarettes. He has a 3 pack-year smoking history. He has never used smokeless tobacco. He reports that he does not drink alcohol or use illicit drugs.  Allergies  Allergen Reactions  . Codeine     The patient states he has allergy to codeine  . Methotrexate Swelling    REACTION: ONLY HAS ONE KIDNEY, caused swelling  in hands and feet    Family History  Problem Relation Age of Onset  . Heart attack Father 29  . Other Mother 15    Old age  . Colon cancer Neg Hx      Prior to Admission medications   Medication Sig Start Date End Date Taking? Authorizing Provider  busPIRone (BUSPAR) 5 MG tablet Take 7.5 mg by mouth 3 (three) times daily.    Yes Historical Provider, MD  Cholecalciferol (VITAMIN D) 2000 UNITS CAPS Take 2,000 Units by mouth daily.   Yes Historical Provider, MD  dextromethorphan-guaiFENesin (MUCINEX DM) 30-600 MG per 12 hr tablet Take 1 tablet by mouth every 12 (twelve) hours as needed for cough.   Yes Historical Provider, MD  digoxin (LANOXIN) 0.125 MG tablet Take 1 tablet (0.125 mg total) by mouth daily. 12/01/13  Yes Dwana Melena, PA-C  diltiazem (CARDIZEM CD) 300 MG 24 hr capsule Take 1 capsule (300 mg total) by mouth daily. 12/01/13  Yes Dwana Melena, PA-C  famotidine (PEPCID) 20 MG tablet One at bedtime Patient taking differently: Take 20 mg by mouth at bedtime. One at bedtime 05/24/14  Yes Nyoka Cowden, MD  furosemide (LASIX) 40 MG tablet Take 40 mg by mouth 2 (two) times daily.  11/10/13  Yes Historical Provider, MD  gabapentin (NEURONTIN) 300 MG capsule Take 1 capsule (300 mg total) by mouth 3 (three) times daily. 09/26/14  Yes Veryl Speak, FNP  leflunomide (ARAVA) 20 MG tablet Take 20 mg by mouth daily.  10/08/14  Yes Historical Provider, MD  LORazepam (ATIVAN) 0.5 MG tablet Take 0.5 mg by mouth 2 (two) times daily.   Yes Historical Provider, MD  Melatonin 5 MG CAPS Take 1 capsule by mouth at bedtime.   Yes Historical Provider, MD  memantine (NAMENDA XR) 14 MG CP24 24 hr capsule Take 14 mg by mouth daily.   Yes Historical Provider, MD  Multiple Vitamin (DAILY VITE) TABS Take 1 tablet by mouth daily.   Yes Historical Provider, MD  OXYGEN Inhale into the lungs. 2L at bedtime   Yes Historical Provider, MD  Oyster Shell 500 MG TABS Take 1,000 mg by mouth daily.    Yes Historical  Provider, MD  pantoprazole (PROTONIX) 40 MG tablet Take 1 tablet (40 mg total) by mouth daily. Take 30-60 min before first meal of the day 05/24/14  Yes Nyoka Cowden, MD  predniSONE (DELTASONE) 10 MG tablet 4 x 2 days, 2 x 2 days, 1 x 2 days, then continue regular dose Patient taking differently: Take 10 mg by mouth daily.  05/24/14  Yes Nyoka Cowden, MD  psyllium (REGULOID) 0.52 G capsule Take 0.52 g by mouth at bedtime.   Yes Historical Provider, MD  QUEtiapine (SEROQUEL) 25 MG tablet Take 25 mg by  mouth 2 (two) times daily.   Yes Historical Provider, MD  rivaroxaban (XARELTO) 20 MG TABS tablet Take 1 tablet (20 mg total) by mouth daily with supper. Patient taking differently: Take 20 mg by mouth daily.  12/01/13  Yes Dwana Melena, PA-C  sertraline (ZOLOFT) 50 MG tablet Take 1 tablet (50 mg total) by mouth daily. 09/26/14  Yes Veryl Speak, FNP  Witch Hazel (TUCKS MEDICATED COOLING) 50 % PADS Apply 1 patch topically. Apply externally to affected area up to 6 times daily or after each bowel movement   Yes Historical Provider, MD  memantine (NAMENDA) 10 MG tablet Take 1 tablet (10 mg total) by mouth daily. Patient not taking: Reported on 10/08/2014 02/12/14   Newt Lukes, MD   Physical Exam: Filed Vitals:   11/06/14 1421 11/06/14 1425 11/06/14 1427 11/06/14 1540  BP:    139/71  Pulse: 89   99  Temp:      TempSrc:      Resp: SpO2: 93% 92% 93% 88%    Wt Readings from Last 3 Encounters:  09/26/14 89.721 kg (197 lb 12.8 oz)  09/11/14 88.111 kg (194 lb 4 oz)  05/24/14 89.812 kg (198 lb)    General:  Patient having dyspnea at rest, becoming short of breath with talking, on 6 L supplemental oxygen satting 88%. Chronically ill-appearing.  Eyes: PERRL, normal lids, irises & conjunctiva ENT: grossly normal hearing, lips & tongue Neck: no LAD, masses or thyromegaly, positive jugular venous distention Cardiovascular: Irregular rate and rhythm normal S1-S2 has 3+ bilateral  lower extremity pitting edema Telemetry: Atrial fibrillation Respiratory: Patient having diminished breath sounds bilaterally with presence of expiratory wheezing along with bilateral crackles and rales Abdomen: soft, ntnd Skin: Stasis dermatitis involving lower extremities bilaterally Musculoskeletal: 3+ bilateral extremity pitting edema Psychiatric: grossly normal mood and affect, speech fluent and appropriate Neurologic: grossly non-focal.          Labs on Admission:  Basic Metabolic Panel:  Recent Labs Lab 11/06/14 1526  NA 141  K 3.8  CL 103  CO2 29  GLUCOSE 126*  BUN 16  CREATININE 0.79  CALCIUM 8.5*   Liver Function Tests:  Recent Labs Lab 11/06/14 1526  AST 27  ALT 37  ALKPHOS 80  BILITOT 1.3*  PROT 6.7  ALBUMIN 3.5   No results for input(s): LIPASE, AMYLASE in the last 168 hours. No results for input(s): AMMONIA in the last 168 hours. CBC:  Recent Labs Lab 11/06/14 1526  WBC 13.8*  NEUTROABS 12.4*  HGB 13.2  HCT 40.3  MCV 99.5  PLT 253   Cardiac Enzymes: No results for input(s): CKTOTAL, CKMB, CKMBINDEX, TROPONINI in the last 168 hours.  BNP (last 3 results)  Recent Labs  02/21/14 1405 10/08/14 1937 11/06/14 1526  BNP 178.7* 206.1* 240.2*    ProBNP (last 3 results)  Recent Labs  11/15/13 2139 05/24/14 1631  PROBNP 4542.0* 223.0*    CBG: No results for input(s): GLUCAP in the last 168 hours.  Radiological Exams on Admission: Dg Chest 2 View  11/06/2014  CLINICAL DATA:  Short of breath EXAM: CHEST  2 VIEW COMPARISON:  10/08/2014 FINDINGS: Chronic interstitial fibrosis is noted. Progression of bilateral airspace disease compared with prior studies suggestive of superimposed acute process such as edema or bilateral pneumonia. No significant pleural effusion. Cardiac enlargement. IMPRESSION: Chronic interstitial fibrosis. Superimposed acute airspace disease bilaterally may be due to edema or pneumonia bilaterally. Electronically  Signed  By: Marlan Palau M.D.   On: 11/06/2014 14:28   Ct Angio Chest Pe W/cm &/or Wo Cm  11/06/2014  CLINICAL DATA:  Dyspnea.  Hypoxia.  COPD.  Hemoptysis. EXAM: CT ANGIOGRAPHY CHEST WITH CONTRAST TECHNIQUE: Multidetector CT imaging of the chest was performed using the standard protocol during bolus administration of intravenous contrast. Multiplanar CT image reconstructions and MIPs were obtained to evaluate the vascular anatomy. CONTRAST:  OMNIPAQUE IOHEXOL 350 MG/ML SOLN COMPARISON:  Multiple exams, including 11/06/2014 hand 08/21/2013. FINDINGS: Mediastinum/Nodes: No filling defect is identified in the pulmonary arterial tree to suggest pulmonary embolus. Coronary, aortic arch, and branch vessel atherosclerotic vascular disease. Moderate cardiomegaly. Interventricular septum thickness 2.1 cm compatible with left ventricular hypertrophy. Scattered mild mediastinal adenopathy. Index prevascular node 1.2 cm, image 37 series 4. Index right lower paratracheal node 1.7 cm, image 33 series 4. Lungs/Pleura: Chronic peripheral coarse interstitial accentuation compatible with fibrosis. Superimposed ground-glass and airspace opacities with secondary pulmonary lobular interstitial accentuation and scattered ground-glass opacities, favoring acute pulmonary edema and less likely to be due to atypical infectious process. Small bilateral pleural effusions. Upper abdomen: There is some reflux of contrast medium into the hepatic veins. Several small hypodense lesions in the lateral segment left hepatic lobe,, similar to prior exam. Musculoskeletal: 6 mm of anterolisthesis at C7-T1, not changed from 08/31/2013. 5-10% superior endplate compression fracture at the T5 vertebral level, image 93 series 10, new compared to the CT scan from 08/31/2013. A all Review of the MIP images confirms the above findings. IMPRESSION: 1. No filling defect is identified in the pulmonary arterial tree to suggest pulmonary embolus. 2.  Moderate cardiomegaly with acute pulmonary edema superimposed on chronic peripheral fibrosis/usual interstitial pneumonia. 3. Left ventricular hypertrophy. 4. Scattered mild mediastinal adenopathy, likely due to passive congestion. 5. 5-10% superior endplate compression fracture at the T5 vertebral level is new compared to 08/31/13 and probably subacute. 6. Small bilateral pleural effusions. 7. Several small hypodense lesions in the lateral segment left hepatic lobe, similar to prior CT chest. Electronically Signed   By: Gaylyn Rong M.D.   On: 11/06/2014 17:20    EKG: Independently reviewed. Atrial fibrillation  Assessment/Plan Principal Problem:   Acute on chronic systolic CHF (congestive heart failure) (HCC) Active Problems:   Acute respiratory failure with hypoxia (HCC)   Essential hypertension   Atrial flutter (HCC)   IPF (idiopathic pulmonary fibrosis) (HCC)   Acute CHF (congestive heart failure) (HCC)   1. Acute on chronic hypoxemic respiratory failure. Patient having a history of pulmonary fibrosis presenting in acute respiratory failure that is evidence by an O2 sat of 88% requiring 6 L of soft mental oxygen. I suspect secondary to combination of multiple factors including acute decompensated congestive heart failure, pulmonary fibrosis, possible developing pneumonia. Patient will be admitted to the stepdown unit for close monitoring as he may require NPPV. Will treat with IV Lasix, IV steroids, antimicrobial therapy.  2. Acute on chronic systolic congestive heart failure. Patient having a history of systolic congestive heart failure with his last transthoracic echocardiogram performed on 02/21/2014 that showed an ejection fraction of 40-45% with diffuse hypokinesis. He presents with signs and symptoms consistent with CHF along with a CT scan that revealed pulmonary edema. He was given 80 mg of IV Lasix in the emergency department. Will continue diuretic therapy with Lasix 60 mg IV  twice a day. He takes 40 mg by mouth twice a day at home. Place him on CHF protocol follow in this and out  along with daily weights. 3. Possible demand ischemia. Lab work in the emergency department showing a troponin of 0.37. Looking at previous labs he had a troponin of 0.31 on 10/08/2014. He reported chest tightness. EKG did not reveal acute ischemic changes. He was seen and evaluated by cardiology in the emergency department. Unlikely reflecting acute coronary syndrome. We will cycle troponins overnight.  4. Possible healthcare associated pneumonia. He presents with acute respiratory failure with labs showing a white count of 13,800. Venous lactate was within normal limits at 1.2. Will cover with broad-spectrum empiric IV antimicrobial therapy. Antimicrobial therapy will need to be reassessed tomorrow. Will obtain a repeat chest x-ray in a.m. Follow-up on blood cultures.  5. Pulmonary fibrosis. I am concerned with the possibility of an acute exacerbation of his pulmonary fibrosis. As mentioned above he appears ill and acute respiratory failure, with oxygen saturations in the upper 80s on 6 L and may require CPAP this evening. Will treat with Solu-Medrol 60 mg IV every 6 hours along with broad-spectrum IV antimicrobial therapy.  6. Atrial fibrillation. Heart rates are the 80s to 90s in the emergency department we'll continue Cardizem 300 mg by mouth daily along with digoxin 0.125 mg po q daily. Will continue anticoagulation with Xarelto.  7. Hypertension. Continue Cardizem 30 mg by mouth daily. Blood pressure stable in the emergency department.    Code Status: CODE STATUS discussed with patient he is a full code DVT Prophylaxis: Fully anticoagulated Family Communication: Family not present Disposition Plan: Will admit to the step down unit, anticipate he will require greater than 2 nights hospitalization  Time spent: 70 min  Jeralyn Bennett [facility] Pager [phone]

## 2014-11-06 NOTE — ED Notes (Signed)
Pt switched to nonrebreather mask 15L

## 2014-11-06 NOTE — ED Notes (Signed)
Bed: WA19 Expected date:  Expected time:  Means of arrival:  Comments: 

## 2014-11-06 NOTE — ED Provider Notes (Addendum)
CSN: [id number]     Arrival date & time 11/06/14  1258 History   First MD Initiated Contact with Patient 11/06/14 1458     Chief Complaint  Patient presents with  . Shortness of Breath     (Consider location/radiation/quality/duration/timing/severity/associated sxs/prior Treatment) HPI  81 year old male presents from his facility with new hypoxia. Patient was wearing 2 L nasal cannula and was at 60%. Patient endorses chronic dyspnea but significantly worse over the past 1 week. Has a chronic cough that is also worse. Describes his sputum is brown with occasional red blood. In eyes any fevers. States he has intermittent chest pain but none now. Chronic bilateral lower extremity edema that has been worsening over this past 1 week. Is on diuretics and states he has been taking these but his leg swelling has been worsening.  Past Medical History  Diagnosis Date  . Atrial flutter (HCC)     a. s/p ablation for RVR  03/2009, Xarelto ongoing  . VENOUS INSUFFICIENCY, LEFT LEG   . PULMONARY FIBROSIS, INTERSTITIAL   . OBSTRUCTIVE SLEEP APNEA   . BENIGN POSITIONAL VERTIGO   . HYPERGLYCEMIA 2011  . ALLERGIC RHINITIS   . Rheumatoid arthritis(714.0)   . Chronic diastolic CHF (congestive heart failure) (HCC)     a. 10/2013 Echo: EF 45-50%, mild TR, mildly dil LA/RA.  Marland Kitchen PERIPHERAL NEUROPATHY   . HYPERTENSION   . HYPERLIPIDEMIA   . DEPRESSION     [facility] 01/2011 for SI  . Anxiety   . Morbid obesity (HCC)   . Diverticulosis of colon (without mention of hemorrhage)   . Atrial fibrillation (HCC)     a. chronic xarelto;  b. prev on amio->d/c'd 2/2 pulmonary fibrosis.  . Pulmonary fibrosis [facility])    Past Surgical History  Procedure Laterality Date  . 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 Shoulder surgery Right 09/2009  . Varicose vein surgery      Left Leg  . Radiofrequency ablation    . Colonoscopy w/ biopsies     Family History   Problem Relation Age of Onset  . Heart attack Father 57  . Other Mother 65    Old age  . Colon cancer Neg Hx    Social History  Substance Use Topics  . Smoking status: Former Smoker -- 1.00 packs/day for 3 years    Types: Cigarettes    Quit date: 01/20/2000  . Smokeless tobacco: Never Used     Comment: Single, lives alone. 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 His family contact would be his sister [name] [phone]  . Alcohol Use: No     Comment: Former    Review of Systems  Constitutional: Negative for fever.  Respiratory: Positive for cough and shortness of breath.   Cardiovascular: Positive for chest pain and leg swelling.  Gastrointestinal: Negative for vomiting and abdominal pain.  All other systems reviewed and are negative.     Allergies  Codeine and Methotrexate  Home Medications   Prior to Admission medications   Medication Sig Start Date End Date Taking? Authorizing Provider  busPIRone (BUSPAR) 5 MG tablet Take 7.5 mg by mouth 3 (three) times daily.    Yes Historical Provider, MD  Cholecalciferol (VITAMIN D) 2000 UNITS CAPS Take 2,000 Units by mouth daily.   Yes Historical Provider, MD  dextromethorphan-guaiFENesin (MUCINEX DM) 30-600 MG per 12 hr tablet  Take 1 tablet by mouth every 12 (twelve) hours as needed for cough.   Yes Historical Provider, MD  digoxin (LANOXIN) 0.125 MG tablet Take 1 tablet (0.125 mg total) by mouth daily. 12/01/13  Yes Dwana Melena, PA-C  diltiazem (CARDIZEM CD) 300 MG 24 hr capsule Take 1 capsule (300 mg total) by mouth daily. 12/01/13  Yes Dwana Melena, PA-C  famotidine (PEPCID) 20 MG tablet One at bedtime Patient taking differently: Take 20 mg by mouth at bedtime. One at bedtime 05/24/14  Yes Nyoka Cowden, MD  furosemide (LASIX) 40 MG tablet Take 40 mg by mouth 2 (two) times daily.  11/10/13  Yes Historical Provider, MD  gabapentin (NEURONTIN) 300 MG capsule Take 1 capsule (300 mg total) by mouth 3  (three) times daily. 09/26/14  Yes Veryl Speak, FNP  leflunomide (ARAVA) 20 MG tablet Take 20 mg by mouth daily.  10/08/14  Yes Historical Provider, MD  LORazepam (ATIVAN) 0.5 MG tablet Take 0.5 mg by mouth 2 (two) times daily.   Yes Historical Provider, MD  Melatonin 5 MG CAPS Take 1 capsule by mouth at bedtime.   Yes Historical Provider, MD  memantine (NAMENDA XR) 14 MG CP24 24 hr capsule Take 14 mg by mouth daily.   Yes Historical Provider, MD  Multiple Vitamin (DAILY VITE) TABS Take 1 tablet by mouth daily.   Yes Historical Provider, MD  OXYGEN Inhale into the lungs. 2L at bedtime   Yes Historical Provider, MD  Oyster Shell 500 MG TABS Take 1,000 mg by mouth daily.    Yes Historical Provider, MD  pantoprazole (PROTONIX) 40 MG tablet Take 1 tablet (40 mg total) by mouth daily. Take 30-60 min before first meal of the day 05/24/14  Yes Nyoka Cowden, MD  predniSONE (DELTASONE) 10 MG tablet 4 x 2 days, 2 x 2 days, 1 x 2 days, then continue regular dose Patient taking differently: Take 10 mg by mouth daily.  05/24/14  Yes Nyoka Cowden, MD  psyllium (REGULOID) 0.52 G capsule Take 0.52 g by mouth at bedtime.   Yes Historical Provider, MD  QUEtiapine (SEROQUEL) 25 MG tablet Take 25 mg by mouth 2 (two) times daily.   Yes Historical Provider, MD  rivaroxaban (XARELTO) 20 MG TABS tablet Take 1 tablet (20 mg total) by mouth daily with supper. Patient taking differently: Take 20 mg by mouth daily.  12/01/13  Yes Dwana Melena, PA-C  sertraline (ZOLOFT) 50 MG tablet Take 1 tablet (50 mg total) by mouth daily. 09/26/14  Yes Veryl Speak, FNP  Witch Hazel (TUCKS MEDICATED COOLING) 50 % PADS Apply 1 patch topically. Apply externally to affected area up to 6 times daily or after each bowel movement   Yes Historical Provider, MD  memantine (NAMENDA) 10 MG tablet Take 1 tablet (10 mg total) by mouth daily. Patient not taking: Reported on 10/08/2014 02/12/14   Newt Lukes, MD   BP 153/94 mmHg  Pulse 89   Temp(Src) 98 F (36.7 C) (Oral)  Resp 22  SpO2 93% Physical Exam  Constitutional: He is oriented to person, place, and time. He appears well-developed and well-nourished.  HENT:  Head: Normocephalic and atraumatic.  Right Ear: External ear normal.  Left Ear: External ear normal.  Nose: Nose normal.  Eyes: Right eye exhibits no discharge. Left eye exhibits no discharge.  Neck: Neck supple.  Cardiovascular: Normal rate, regular rhythm, normal heart sounds and intact distal pulses.   Pulmonary/Chest: No accessory  muscle usage. Tachypnea noted. No respiratory distress. He has rales in the right middle field, the right lower field, the left middle field and the left lower field.  Abdominal: Soft. There is no tenderness.  Musculoskeletal: He exhibits edema (Bilateral pitting edema to lower legs up to knees).  Neurological: He is alert and oriented to person, place, and time.  Skin: Skin is warm and dry.  Nursing note and vitals reviewed.   ED Course  Procedures (including critical care time) Labs Review Labs Reviewed  COMPREHENSIVE METABOLIC PANEL - Abnormal; Notable for the following:    Glucose, Bld 126 (*)    Calcium 8.5 (*)    Total Bilirubin 1.3 (*)    All other components within normal limits  CBC WITH DIFFERENTIAL/PLATELET - Abnormal; Notable for the following:    WBC 13.8 (*)    RBC 4.05 (*)    RDW 17.1 (*)    Neutro Abs 12.4 (*)    Lymphs Abs 0.4 (*)    All other components within normal limits  BRAIN NATRIURETIC PEPTIDE - Abnormal; Notable for the following:    B Natriuretic Peptide 240.2 (*)    All other components within normal limits  TROPONIN I - Abnormal; Notable for the following:    Troponin I 0.33 (*)    All other components within normal limits  TROPONIN I - Abnormal; Notable for the following:    Troponin I 0.34 (*)    All other components within normal limits  I-STAT TROPOININ, ED - Abnormal; Notable for the following:    Troponin i, poc 0.37 (*)    All  other components within normal limits  MRSA PCR SCREENING  CULTURE, BLOOD (ROUTINE X 2)  CULTURE, BLOOD (ROUTINE X 2)  TROPONIN I  TROPONIN I  BASIC METABOLIC PANEL  CBC  TROPONIN I  TROPONIN I  I-STAT CG4 LACTIC ACID, ED    Imaging Review Dg Chest 2 View  11/06/2014  CLINICAL DATA:  Short of breath EXAM: CHEST  2 VIEW COMPARISON:  10/08/2014 FINDINGS: Chronic interstitial fibrosis is noted. Progression of bilateral airspace disease compared with prior studies suggestive of superimposed acute process such as edema or bilateral pneumonia. No significant pleural effusion. Cardiac enlargement. IMPRESSION: Chronic interstitial fibrosis. Superimposed acute airspace disease bilaterally may be due to edema or pneumonia bilaterally. Electronically Signed   By: Marlan Palau M.D.   On: 11/06/2014 14:28   Ct Angio Chest Pe W/cm &/or Wo Cm  11/06/2014  CLINICAL DATA:  Dyspnea.  Hypoxia.  COPD.  Hemoptysis. EXAM: CT ANGIOGRAPHY CHEST WITH CONTRAST TECHNIQUE: Multidetector CT imaging of the chest was performed using the standard protocol during bolus administration of intravenous contrast. Multiplanar CT image reconstructions and MIPs were obtained to evaluate the vascular anatomy. CONTRAST:  OMNIPAQUE IOHEXOL 350 MG/ML SOLN COMPARISON:  Multiple exams, including 11/06/2014 hand 08/21/2013. FINDINGS: Mediastinum/Nodes: No filling defect is identified in the pulmonary arterial tree to suggest pulmonary embolus. Coronary, aortic arch, and branch vessel atherosclerotic vascular disease. Moderate cardiomegaly. Interventricular septum thickness 2.1 cm compatible with left ventricular hypertrophy. Scattered mild mediastinal adenopathy. Index prevascular node 1.2 cm, image 37 series 4. Index right lower paratracheal node 1.7 cm, image 33 series 4. Lungs/Pleura: Chronic peripheral coarse interstitial accentuation compatible with fibrosis. Superimposed ground-glass and airspace opacities with secondary  pulmonary lobular interstitial accentuation and scattered ground-glass opacities, favoring acute pulmonary edema and less likely to be due to atypical infectious process. Small bilateral pleural effusions. Upper abdomen: There is some reflux of  contrast medium into the hepatic veins. Several small hypodense lesions in the lateral segment left hepatic lobe,, similar to prior exam. Musculoskeletal: 6 mm of anterolisthesis at C7-T1, not changed from 08/31/2013. 5-10% superior endplate compression fracture at the T5 vertebral level, image 93 series 10, new compared to the CT scan from 08/31/2013. A all Review of the MIP images confirms the above findings. IMPRESSION: 1. No filling defect is identified in the pulmonary arterial tree to suggest pulmonary embolus. 2. Moderate cardiomegaly with acute pulmonary edema superimposed on chronic peripheral fibrosis/usual interstitial pneumonia. 3. Left ventricular hypertrophy. 4. Scattered mild mediastinal adenopathy, likely due to passive congestion. 5. 5-10% superior endplate compression fracture at the T5 vertebral level is new compared to 08/31/13 and probably subacute. 6. Small bilateral pleural effusions. 7. Several small hypodense lesions in the lateral segment left hepatic lobe, similar to prior CT chest. Electronically Signed   By: Gaylyn Rong M.D.   On: 11/06/2014 17:20   I have personally reviewed and evaluated these images and lab results as part of my medical decision-making.   EKG Interpretation   Date/Time:  Tuesday November 06 2014 13:13:32 EDT Ventricular Rate:  89 PR Interval:    QRS Duration: 94 QT Interval:  345 QTC Calculation: 420 R Axis:   -66 Text Interpretation:  Atrial fibrillation Left anterior fascicular block  Anteroseptal infarct, old Repol abnrm suggests ischemia, lateral leads no  significant change since Sept 2016 Confirmed by Criss Alvine  MD, Deyci Gesell ([id number])  on 11/06/2014 3:02:04 PM      MDM   Final diagnoses:  Acute  congestive heart failure, unspecified congestive heart failure type [facility])    Patient symptoms are most consistent with acute pulmonary edema from CHF. Given IV Lasix in the emergency department. Developed increasing trouble breathing as well as progressive hypoxia and thus was placed on BiPAP with good effect. There could be a pneumonia component and will start broad antibiotics, though I feel this is mostly due to edema. Will be admitted to the stepdown unit for further diuresis and airway and breathing support. Cardiology has been consulted and has seen the patient. Still speaking in complete sentences and does not need intubation at this time.     Pricilla Loveless, MD 11/06/14 0093  Pricilla Loveless, MD 11/06/14 [phone]

## 2014-11-06 NOTE — Progress Notes (Signed)
ANTIBIOTIC CONSULT NOTE - INITIAL  Pharmacy Consult for vancomycin and ceftazidime  Indication: pneumonia  Allergies  Allergen Reactions  . Codeine     The patient states he has allergy to codeine  . Methotrexate Swelling    REACTION: ONLY HAS ONE KIDNEY, caused swelling in hands and feet    Patient Measurements: weight 89 kg, height 68 inches   Vital Signs: Temp: 98 F (36.7 C) (10/18 1311) Temp Source: Oral (10/18 1311) BP: 139/71 mmHg (10/18 1540) Pulse Rate: 43 (10/18 1754) Intake/Output from previous day:   Intake/Output from this shift:    Labs:  Recent Labs  11/06/14 1526  WBC 13.8*  HGB 13.2  PLT 253  CREATININE 0.79   CrCl cannot be calculated (Unknown ideal weight.). No results for input(s): VANCOTROUGH, VANCOPEAK, VANCORANDOM, GENTTROUGH, GENTPEAK, GENTRANDOM, TOBRATROUGH, TOBRAPEAK, TOBRARND, AMIKACINPEAK, AMIKACINTROU, AMIKACIN in the last 72 hours.   Microbiology: No results found for this or any previous visit (from the past 720 hour(s)).  Medical History: Past Medical History  Diagnosis Date  . Atrial flutter (HCC)     a. s/p ablation for RVR  03/2009, Xarelto ongoing  . VENOUS INSUFFICIENCY, LEFT LEG   . PULMONARY FIBROSIS, INTERSTITIAL   . OBSTRUCTIVE SLEEP APNEA   . BENIGN POSITIONAL VERTIGO   . HYPERGLYCEMIA 2011  . ALLERGIC RHINITIS   . Rheumatoid arthritis(714.0)   . Chronic diastolic CHF (congestive heart failure) (HCC)     a. 10/2013 Echo: EF 45-50%, mild TR, mildly dil LA/RA.  Marland Kitchen PERIPHERAL NEUROPATHY   . HYPERTENSION   . HYPERLIPIDEMIA   . DEPRESSION     [facility] hosp 01/2011 for SI  . Anxiety   . Morbid obesity (HCC)   . Diverticulosis of colon (without mention of hemorrhage)   . Atrial fibrillation (HCC)     a. chronic xarelto;  b. prev on amio->d/c'd 2/2 pulmonary fibrosis.  . Pulmonary fibrosis [facility])     Assessment: Patient's an 81 y.o F who presented to the ED from [facility] with c/o SOB.  CXR is suspicious for PNA.   To start vancomycin and ceftazidime for r/o PNA.  Goal of Therapy:  Vancomycin trough level 15-20 mcg/ml  Plan:  - ceftazidime 1gm IV q8h - vancomycin 1gm IV q12h  Jarold Macomber P 11/06/2014,6:06 PM

## 2014-11-06 NOTE — Consult Note (Signed)
CONSULT NOTE  Date: 11/06/2014               Patient Name:  [name] MRN: [id number]  DOB: [birth year] Age / Sex: 81 y.o., male        PCP: [name] Primary Cardiologist: [place]             Referring Physician: Vanessa Barbara               Reason for Consult: Hypoxemia, + troponin .            History of Present Illness: Patient is a 81 y.o. male with a PMHx of atrial fib, pulmonary fibrosis,  Chronic diastolic CHF,  HTN, OSA, , who was admitted to [facility] on 11/06/2014 for evaluation of worsening dyspnea.  He was found to have a positive Troponin and we were asked to see     Has chronic dyspnea .  Worse over the past several month s. Has tightness in chest  Worse with exertion  Or bending over to tie his shoes.  Last for several minutes - to as much as an hour.  Swelling in legs - worse over the past several weeks.. Lives in assisted living .    Medications: Outpatient medications:  (Not in a hospital admission)  Current medications: Current Facility-Administered Medications  Medication Dose Route Frequency Provider Last Rate Last Dose  . furosemide (LASIX) injection 80 mg  80 mg Intravenous Once Pricilla Loveless, MD       Current Outpatient Prescriptions  Medication Sig Dispense Refill  . busPIRone (BUSPAR) 5 MG tablet Take 7.5 mg by mouth 3 (three) times daily.     . Cholecalciferol (VITAMIN D) 2000 UNITS CAPS Take 2,000 Units by mouth daily.    Marland Kitchen dextromethorphan-guaiFENesin (MUCINEX DM) 30-600 MG per 12 hr tablet Take 1 tablet by mouth every 12 (twelve) hours as needed for cough.    . digoxin (LANOXIN) 0.125 MG tablet Take 1 tablet (0.125 mg total) by mouth daily. 30 tablet 6  . diltiazem (CARDIZEM CD) 300 MG 24 hr capsule Take 1 capsule (300 mg total) by mouth daily. 60 capsule 6  . famotidine (PEPCID) 20 MG tablet One at bedtime (Patient taking differently: Take 20 mg by mouth at bedtime. One at bedtime) 30 tablet 2  . furosemide (LASIX) 40 MG tablet  Take 40 mg by mouth 2 (two) times daily.     Marland Kitchen gabapentin (NEURONTIN) 300 MG capsule Take 1 capsule (300 mg total) by mouth 3 (three) times daily. 90 capsule 1  . leflunomide (ARAVA) 20 MG tablet Take 20 mg by mouth daily.     Marland Kitchen LORazepam (ATIVAN) 0.5 MG tablet Take 0.5 mg by mouth 2 (two) times daily.    . Melatonin 5 MG CAPS Take 1 capsule by mouth at bedtime.    . memantine (NAMENDA XR) 14 MG CP24 24 hr capsule Take 14 mg by mouth daily.    . Multiple Vitamin (DAILY VITE) TABS Take 1 tablet by mouth daily.    . OXYGEN Inhale into the lungs. 2L at bedtime    . Oyster Shell 500 MG TABS Take 1,000 mg by mouth daily.     . pantoprazole (PROTONIX) 40 MG tablet Take 1 tablet (40 mg total) by mouth daily. Take 30-60 min before first meal of the day 30 tablet 2  . predniSONE (DELTASONE) 10 MG tablet 4 x 2 days, 2 x 2 days, 1 x 2 days, then continue  regular dose (Patient taking differently: Take 10 mg by mouth daily. ) 14 tablet 0  . psyllium (REGULOID) 0.52 G capsule Take 0.52 g by mouth at bedtime.    Marland Kitchen QUEtiapine (SEROQUEL) 25 MG tablet Take 25 mg by mouth 2 (two) times daily.    . rivaroxaban (XARELTO) 20 MG TABS tablet Take 1 tablet (20 mg total) by mouth daily with supper. (Patient taking differently: Take 20 mg by mouth daily. ) 30 tablet 6  . sertraline (ZOLOFT) 50 MG tablet Take 1 tablet (50 mg total) by mouth daily. 30 tablet 3  . Witch Hazel (TUCKS MEDICATED COOLING) 50 % PADS Apply 1 patch topically. Apply externally to affected area up to 6 times daily or after each bowel movement    . memantine (NAMENDA) 10 MG tablet Take 1 tablet (10 mg total) by mouth daily. (Patient not taking: Reported on 10/08/2014) 30 tablet 5     Allergies  Allergen Reactions  . Codeine     The patient states he has allergy to codeine  . Methotrexate Swelling    REACTION: ONLY HAS ONE KIDNEY, caused swelling in hands and feet     Past Medical History  Diagnosis Date  . Atrial flutter (HCC)     a. s/p  ablation for RVR  03/2009, Xarelto ongoing  . VENOUS INSUFFICIENCY, LEFT LEG   . PULMONARY FIBROSIS, INTERSTITIAL   . OBSTRUCTIVE SLEEP APNEA   . BENIGN POSITIONAL VERTIGO   . HYPERGLYCEMIA 2011  . ALLERGIC RHINITIS   . Rheumatoid arthritis(714.0)   . Chronic diastolic CHF (congestive heart failure) (HCC)     a. 10/2013 Echo: EF 45-50%, mild TR, mildly dil LA/RA.  Marland Kitchen PERIPHERAL NEUROPATHY   . HYPERTENSION   . HYPERLIPIDEMIA   . DEPRESSION     [facility] hosp 01/2011 for SI  . Anxiety   . Morbid obesity (HCC)   . Diverticulosis of colon (without mention of hemorrhage)   . Atrial fibrillation (HCC)     a. chronic xarelto;  b. prev on amio->d/c'd 2/2 pulmonary fibrosis.  . Pulmonary fibrosis [facility])     Past Surgical History  Procedure Laterality Date  . 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 Shoulder surgery Right 09/2009  . Varicose vein surgery      Left Leg  . Radiofrequency ablation    . Colonoscopy w/ biopsies      Family History  Problem Relation Age of Onset  . Heart attack Father 80  . Other Mother 30    Old age  . Colon cancer Neg Hx     Social History:  reports that he quit smoking about 14 years ago. His smoking use included Cigarettes. He has a 3 pack-year smoking history. He has never used smokeless tobacco. He reports that he does not drink alcohol or use illicit drugs.   Review of Systems: Constitutional:  denies fever, chills, diaphoresis, appetite change and fatigue.  HEENT: denies photophobia, eye pain, redness, hearing loss, ear pain, congestion, sore throat, rhinorrhea, sneezing, neck pain, neck stiffness and tinnitus.  Respiratory: admits to SOB, DOE, cough, chest tightness,  Wheezing   Cardiovascular: admits to chest tightness.    and leg swelling.  Gastrointestinal: denies nausea, vomiting, abdominal pain, diarrhea, constipation, blood in stool.  Genitourinary: denies dysuria, urgency, frequency,  hematuria, flank pain and difficulty urinating.  Musculoskeletal: denies  myalgias, back pain, joint swelling, arthralgias and gait problem.  Skin: denies pallor, rash and wound.  Neurological: denies dizziness, seizures, syncope, weakness, light-headedness, numbness and headaches.   Hematological: denies adenopathy, easy bruising, personal or family bleeding history.  Psychiatric/ Behavioral: denies suicidal ideation, mood changes, confusion, nervousness, sleep disturbance and agitation.    Physical Exam: BP 139/71 mmHg  Pulse 99  Temp(Src) 98 F (36.7 C) (Oral)  Resp 22  SpO2 88%  Wt Readings from Last 3 Encounters:  09/26/14 89.721 kg (197 lb 12.8 oz)  09/11/14 88.111 kg (194 lb 4 oz)  05/24/14 89.812 kg (198 lb)    General: Vital signs reviewed and noted.   Chronically ill appearing man   Head: Normocephalic, atraumatic, sclera anicteric,   Neck: Supple. Negative for carotid bruits.   Mild JVD   Lungs:  Coarse rales in both bases. Diffuse tight wheezes   Heart: Irreg. Irreg.  with S1 S2.  Soft systolic murmur   Abdomen/ GI :  Soft, non-tender, non-distended with normoactive bowel sounds. No hepatomegaly. No rebound/guarding. No obvious abdominal masses   MSK: Strength and the appear normal for age.   Extremities: No clubbing or cyanosis. 2-3 + edema , left > right   Neurologic:  CN are grossly intact,  No obvious motor or sensory defect.  Alert and oriented X 3. Moves all extremities spontaneously.  Psych Seems to have some memory issues.     Lab results: Basic Metabolic Panel:  Recent Labs Lab 11/06/14 1526  NA 141  K 3.8  CL 103  CO2 29  GLUCOSE 126*  BUN 16  CREATININE 0.79  CALCIUM 8.5*    Liver Function Tests:  Recent Labs Lab 11/06/14 1526  AST 27  ALT 37  ALKPHOS 80  BILITOT 1.3*  PROT 6.7  ALBUMIN 3.5   No results for input(s): LIPASE, AMYLASE in the last 168 hours. No results for input(s): AMMONIA in the last 168 hours.  CBC:  Recent  Labs Lab 11/06/14 1526  WBC 13.8*  NEUTROABS 12.4*  HGB 13.2  HCT 40.3  MCV 99.5  PLT 253    Cardiac Enzymes: No results for input(s): CKTOTAL, CKMB, CKMBINDEX, TROPONINI in the last 168 hours.  BNP: Invalid input(s): POCBNP  CBG: No results for input(s): GLUCAP in the last 168 hours.  Coagulation Studies: No results for input(s): LABPROT, INR in the last 72 hours.   Other results:  Atrial fib with controlled V response.  Occasional PVCs   Imaging: Dg Chest 2 View  11/06/2014  CLINICAL DATA:  Short of breath EXAM: CHEST  2 VIEW COMPARISON:  10/08/2014 FINDINGS: Chronic interstitial fibrosis is noted. Progression of bilateral airspace disease compared with prior studies suggestive of superimposed acute process such as edema or bilateral pneumonia. No significant pleural effusion. Cardiac enlargement. IMPRESSION: Chronic interstitial fibrosis. Superimposed acute airspace disease bilaterally may be due to edema or pneumonia bilaterally. Electronically Signed   By: Marlan Palau M.D.   On: 11/06/2014 14:28        Assessment & Plan:  1. + Troponin :  Likely due to strain .  Symptoms are not c/w ACS.   He is a poor candidate for interventional procedures.   I would collect serial troponin levels.  I would not think that a myvoiew would be necessary   2. Atrial fib:  Rate is fairly well controlled . Continue Digoxin,   xarelto He is currently on Cardizem which is not ideal in the setting of reduced LV systolic function .   Given his wheezing, I doubt  that he would tolerate beta blockers. So we will continue the Diltiazem for now  3. Pulmonary fibrosis:  I suspect this is the primary cause of his hypoxemia and pulmonary HTN. Sees Dr. Sherene Sires.   4. Moderate pulmonary Hypertension:  Continue lasix for now.  There has been issues with compliance with salt restriction in the past.   5. Chronic systolic CHF:  Has an EF of 45% as of echo in Feb. 2016.  Continue lasix.  Doubt he  would tolerate beta blocker very well.  Will start low dose Losartan  hes on   He has some degree of dementia and his hx is not always easy to follow    Alvia Grove., MD, [facility] 11/06/2014, 5:22 PM Office - [phone] Pager [phone]

## 2014-11-06 NOTE — ED Notes (Signed)
Per EMS, staff at [facility] nursing home state the pt's O2 saturation was 60 percent on 2L [place] this morning when checking vitals. Pt states he had some difficulty breathing this morning. Pt has hx of COPD. Pt O2 sat now 96 percent on 6L. Pt denies chest pain and states he does not have difficulty breathing at this time. Pt walks with a walker, states he had difficulty walking this morning.

## 2014-11-07 ENCOUNTER — Inpatient Hospital Stay (HOSPITAL_COMMUNITY): Payer: Medicare Other

## 2014-11-07 DIAGNOSIS — R778 Other specified abnormalities of plasma proteins: Secondary | ICD-10-CM | POA: Insufficient documentation

## 2014-11-07 DIAGNOSIS — J9601 Acute respiratory failure with hypoxia: Secondary | ICD-10-CM

## 2014-11-07 DIAGNOSIS — I4892 Unspecified atrial flutter: Secondary | ICD-10-CM

## 2014-11-07 DIAGNOSIS — J189 Pneumonia, unspecified organism: Secondary | ICD-10-CM

## 2014-11-07 DIAGNOSIS — Z7901 Long term (current) use of anticoagulants: Secondary | ICD-10-CM | POA: Insufficient documentation

## 2014-11-07 DIAGNOSIS — I509 Heart failure, unspecified: Secondary | ICD-10-CM

## 2014-11-07 DIAGNOSIS — R7989 Other specified abnormal findings of blood chemistry: Secondary | ICD-10-CM | POA: Insufficient documentation

## 2014-11-07 DIAGNOSIS — R0600 Dyspnea, unspecified: Secondary | ICD-10-CM

## 2014-11-07 DIAGNOSIS — J84112 Idiopathic pulmonary fibrosis: Secondary | ICD-10-CM

## 2014-11-07 DIAGNOSIS — I5023 Acute on chronic systolic (congestive) heart failure: Principal | ICD-10-CM

## 2014-11-07 LAB — BASIC METABOLIC PANEL
ANION GAP: 8 (ref 5–15)
ANION GAP: 9 (ref 5–15)
BUN: 16 mg/dL (ref 6–20)
BUN: 20 mg/dL (ref 6–20)
CO2: 31 mmol/L (ref 22–32)
CO2: 29 mmol/L (ref 22–32)
Calcium: 8.5 mg/dL — ABNORMAL LOW (ref 8.9–10.3)
Calcium: 8.7 mg/dL — ABNORMAL LOW (ref 8.9–10.3)
Chloride: 103 mmol/L (ref 101–111)
Chloride: 103 mmol/L (ref 101–111)
Creatinine, Ser: 0.87 mg/dL (ref 0.61–1.24)
Creatinine, Ser: 0.92 mg/dL (ref 0.61–1.24)
GFR calc Af Amer: >60 mL/min (ref >60)
GLUCOSE: 164 mg/dL — AB (ref 65–99)
GLUCOSE: 224 mg/dL — AB (ref 65–99)
POTASSIUM: 3.8 mmol/L (ref 3.5–5.1)
POTASSIUM: 4 mmol/L (ref 3.5–5.1)
Sodium: 142 mmol/L (ref 135–145)
Sodium: 141 mmol/L (ref 135–145)

## 2014-11-07 LAB — BLOOD GAS, ARTERIAL
ACID-BASE EXCESS: 3.4 mmol/L — AB (ref 0.0–2.0)
ACID-BASE EXCESS: 1.9 mmol/L (ref 0.0–2.0)
Acid-Base Excess: 4.1 mmol/L — ABNORMAL HIGH (ref 0.0–2.0)
BICARBONATE: 25.6 meq/L — AB (ref 20.0–24.0)
Bicarbonate: 26.1 mEq/L — ABNORMAL HIGH (ref 20.0–24.0)
Bicarbonate: 28.2 mEq/L — ABNORMAL HIGH (ref 20.0–24.0)
DRAWN BY: 257701
Drawn by: 295031
Drawn by: 295031
FIO2: 0.7
FIO2: 0.7
FIO2: 1
MECHVT: 550 mL
MODE: POSITIVE
MODE: POSITIVE
O2 SAT: 89.8 %
O2 SAT: 98.5 %
O2 Saturation: 88.2 %
PATIENT TEMPERATURE: 98.6
PCO2 ART: 52.6 mmHg — AB (ref 35.0–45.0)
PEEP/CPAP: 8 cmH2O
PEEP: 8 cmH2O
PEEP: 8 cmH2O
PH ART: 7.506 — AB (ref 7.350–7.450)
PH ART: 7.348 — AB (ref 7.350–7.450)
PO2 ART: 61 mmHg — AB (ref 80.0–100.0)
PRESSURE CONTROL: 7 cmH2O
Patient temperature: 98.6
Patient temperature: 98.6
Pressure control: 7 cmH2O
RATE: 18 resp/min
TCO2: 22 mmol/L (ref 0–100)
TCO2: 21.7 mmol/L (ref 0–100)
TCO2: 24.7 mmol/L (ref 0–100)
pCO2 arterial: 32 mmHg — ABNORMAL LOW (ref 35.0–45.0)
pCO2 arterial: 32.7 mmHg — ABNORMAL LOW (ref 35.0–45.0)
pH, Arterial: 7.522 — ABNORMAL HIGH (ref 7.350–7.450)
pO2, Arterial: 67.5 mmHg — ABNORMAL LOW (ref 80.0–100.0)
pO2, Arterial: 239 mmHg — ABNORMAL HIGH (ref 80.0–100.0)

## 2014-11-07 LAB — CBC
HEMATOCRIT: 41.2 % (ref 39.0–52.0)
HEMOGLOBIN: 13.8 g/dL (ref 13.0–17.0)
MCH: 33.7 pg (ref 26.0–34.0)
MCHC: 33.5 g/dL (ref 30.0–36.0)
MCV: 100.5 fL — ABNORMAL HIGH (ref 78.0–100.0)
Platelets: 253 10*3/uL (ref 150–400)
RBC: 4.1 MIL/uL — AB (ref 4.22–5.81)
RDW: 17.2 % — ABNORMAL HIGH (ref 11.5–15.5)
WBC: 12.7 10*3/uL — AB (ref 4.0–10.5)

## 2014-11-07 LAB — TROPONIN I
TROPONIN I: 0.29 ng/mL — AB (ref <0.031)
Troponin I: 0.31 ng/mL — ABNORMAL HIGH (ref <0.031)

## 2014-11-07 LAB — MAGNESIUM: Magnesium: 2.2 mg/dL (ref 1.7–2.4)

## 2014-11-07 MED ORDER — POTASSIUM CHLORIDE 10 MEQ/100ML IV SOLN
10.0000 meq | INTRAVENOUS | Status: AC
  Administered 2014-11-07 (×4): 10 meq via INTRAVENOUS
  Filled 2014-11-07 (×4): qty 100

## 2014-11-07 MED ORDER — HYDRALAZINE HCL 20 MG/ML IJ SOLN
10.0000 mg | INTRAMUSCULAR | Status: DC | PRN
  Administered 2014-11-07: 20 mg via INTRAVENOUS
  Administered 2014-11-10 – 2014-11-15 (×3): 10 mg via INTRAVENOUS
  Filled 2014-11-07 (×4): qty 1

## 2014-11-07 MED ORDER — SUCCINYLCHOLINE CHLORIDE 20 MG/ML IJ SOLN
INTRAMUSCULAR | Status: AC
  Filled 2014-11-07: qty 1

## 2014-11-07 MED ORDER — ETOMIDATE 2 MG/ML IV SOLN
INTRAVENOUS | Status: AC
  Administered 2014-11-07: 20 mg
  Filled 2014-11-07: qty 20

## 2014-11-07 MED ORDER — LIDOCAINE HCL (CARDIAC) 20 MG/ML IV SOLN
INTRAVENOUS | Status: AC
  Filled 2014-11-07: qty 5

## 2014-11-07 MED ORDER — ANTISEPTIC ORAL RINSE SOLUTION (CORINZ)
7.0000 mL | Freq: Four times a day (QID) | OROMUCOSAL | Status: DC
  Administered 2014-11-07 – 2014-11-15 (×28): 7 mL via OROMUCOSAL

## 2014-11-07 MED ORDER — PANTOPRAZOLE SODIUM 40 MG IV SOLR
40.0000 mg | INTRAVENOUS | Status: DC
  Administered 2014-11-07 – 2014-11-15 (×9): 40 mg via INTRAVENOUS
  Filled 2014-11-07 (×9): qty 40

## 2014-11-07 MED ORDER — ROCURONIUM BROMIDE 50 MG/5ML IV SOLN
INTRAVENOUS | Status: AC
  Administered 2014-11-07: 90 mg
  Filled 2014-11-07: qty 2

## 2014-11-07 MED ORDER — DOCUSATE SODIUM 50 MG/5ML PO LIQD: 100.0000 mg | Freq: Two times a day (BID) | ORAL | Status: DC | PRN

## 2014-11-07 MED ORDER — METHYLPREDNISOLONE SODIUM SUCC 40 MG IJ SOLR
40.0000 mg | Freq: Every day | INTRAMUSCULAR | Status: DC
  Administered 2014-11-08 – 2014-11-15 (×8): 40 mg via INTRAVENOUS
  Filled 2014-11-07 (×9): qty 1

## 2014-11-07 MED ORDER — FUROSEMIDE 10 MG/ML IJ SOLN
60.0000 mg | Freq: Four times a day (QID) | INTRAMUSCULAR | Status: AC
  Administered 2014-11-07 (×2): 60 mg via INTRAVENOUS
  Filled 2014-11-07 (×2): qty 6

## 2014-11-07 MED ORDER — AMIODARONE LOAD VIA INFUSION: 150.0000 mg | Freq: Once | INTRAVENOUS | Status: DC

## 2014-11-07 MED ORDER — SODIUM CHLORIDE 0.9 % IV SOLN
25.0000 ug/h | INTRAVENOUS | Status: DC
  Administered 2014-11-07: 100 ug/h via INTRAVENOUS
  Administered 2014-11-10 – 2014-11-12 (×3): 50 ug/h via INTRAVENOUS
  Administered 2014-11-12: 75 ug/h via INTRAVENOUS
  Administered 2014-11-12: 50 ug/h via INTRAVENOUS
  Filled 2014-11-07 (×6): qty 50

## 2014-11-07 MED ORDER — DILTIAZEM HCL 100 MG IV SOLR
5.0000 mg/h | INTRAVENOUS | Status: DC
  Administered 2014-11-07: 5 mg/h via INTRAVENOUS
  Filled 2014-11-07 (×2): qty 100

## 2014-11-07 MED ORDER — HYDRALAZINE HCL 20 MG/ML IJ SOLN
10.0000 mg | Freq: Four times a day (QID) | INTRAMUSCULAR | Status: DC | PRN
  Administered 2014-11-07: 10 mg via INTRAVENOUS
  Filled 2014-11-07: qty 1

## 2014-11-07 MED ORDER — MIDAZOLAM HCL 2 MG/2ML IJ SOLN
2.0000 mg | Freq: Once | INTRAMUSCULAR | Status: AC
  Administered 2014-11-07: 2 mg via INTRAVENOUS

## 2014-11-07 MED ORDER — LORAZEPAM 2 MG/ML IJ SOLN
0.5000 mg | INTRAMUSCULAR | Status: DC | PRN
  Administered 2014-11-07 – 2014-11-12 (×5): 1 mg via INTRAVENOUS
  Administered 2014-11-14: 0.5 mg via INTRAVENOUS
  Administered 2014-11-17: 1 mg via INTRAVENOUS
  Filled 2014-11-07 (×9): qty 1

## 2014-11-07 MED ORDER — AMIODARONE HCL IN DEXTROSE 360-4.14 MG/200ML-% IV SOLN
INTRAVENOUS | Status: AC
  Filled 2014-11-07: qty 200

## 2014-11-07 MED ORDER — FENTANYL BOLUS VIA INFUSION
25.0000 ug | INTRAVENOUS | Status: DC | PRN
Start: 2014-11-07 — End: 2014-11-13
  Administered 2014-11-08 – 2014-11-09 (×2): 25 ug via INTRAVENOUS
  Filled 2014-11-07: qty 25

## 2014-11-07 MED ORDER — LORAZEPAM 2 MG/ML IJ SOLN
0.5000 mg | Freq: Once | INTRAMUSCULAR | Status: AC
  Administered 2014-11-07: 0.5 mg via INTRAVENOUS
  Filled 2014-11-07: qty 1

## 2014-11-07 MED ORDER — FENTANYL CITRATE (PF) 100 MCG/2ML IJ SOLN
50.0000 ug | Freq: Once | INTRAMUSCULAR | Status: AC
  Administered 2014-11-07: 50 ug via INTRAVENOUS

## 2014-11-07 MED ORDER — FENTANYL CITRATE (PF) 100 MCG/2ML IJ SOLN
INTRAMUSCULAR | Status: AC
  Filled 2014-11-07: qty 4

## 2014-11-07 MED ORDER — CHLORHEXIDINE GLUCONATE 0.12% ORAL RINSE (MEDLINE KIT)
15.0000 mL | Freq: Two times a day (BID) | OROMUCOSAL | Status: DC
  Administered 2014-11-07 – 2014-11-16 (×19): 15 mL via OROMUCOSAL
  Filled 2014-11-07 (×2): qty 15

## 2014-11-07 MED ORDER — FAMOTIDINE IN NACL 20-0.9 MG/50ML-% IV SOLN
20.0000 mg | Freq: Two times a day (BID) | INTRAVENOUS | Status: DC
  Administered 2014-11-07 – 2014-11-08 (×3): 20 mg via INTRAVENOUS
  Filled 2014-11-07 (×3): qty 50

## 2014-11-07 MED ORDER — MIDAZOLAM HCL 2 MG/2ML IJ SOLN
INTRAMUSCULAR | Status: AC
  Administered 2014-11-07: 2 mg
  Filled 2014-11-07: qty 4

## 2014-11-07 MED ORDER — DILTIAZEM LOAD VIA INFUSION
10.0000 mg | Freq: Once | INTRAVENOUS | Status: DC
Start: 2014-11-07 — End: 2014-11-09
  Filled 2014-11-07: qty 10

## 2014-11-07 MED ORDER — LORAZEPAM 2 MG/ML IJ SOLN
0.5000 mg | Freq: Two times a day (BID) | INTRAMUSCULAR | Status: DC
  Administered 2014-11-07 – 2014-11-15 (×17): 0.5 mg via INTRAVENOUS
  Filled 2014-11-07 (×16): qty 1

## 2014-11-07 NOTE — Progress Notes (Signed)
[place] pulmonary critical care progress note  I came back to see [name] this afternoon because of Nicholas ongoing respiratory failure. Nicholas situation has worsened and despite anxiolytics and diuresis Nicholas hypoxemia has worsened.  On exam he is in marked respiratory distress with Rales bilaterally  I discussed Nicholas situation with [name]. He has IPF but does not use oxygen at baseline (or only uses 2 L, this is still not clear). However, the current presentation appears most consistent with acute pulmonary edema which should be reversible. The patient and [name] both agreed to short-term intubation for respiratory support while we continue diuresis.  He was emergently intubated this afternoon.  Fentanyl used for sedation, titrate to rest score of -1 Full ventilator support ordered Place central line Start diltiazem for A. fib with RVR A G-tube so he can get Nicholas oral meds  Additional critical care time by me 45 minutes  Nicholas Tyhee, MD [place] [facility] Pager: [phone] Cell: [phone] After 3pm or if no response, call [phone]

## 2014-11-07 NOTE — Procedures (Signed)
Intubation Procedure Note [name] [id number] 1932/01/03  Procedure: Intubation Indications: Respiratory insufficiency  Procedure Details Consent: Risks of procedure as well as the alternatives and risks of each were explained to the (patient/caregiver).  Consent for procedure obtained. Time Out: Verified patient identification, verified procedure, site/side was marked, verified correct patient position, special equipment/implants available, medications/allergies/relevent history reviewed, required imaging and test results available.  Performed  Drugs versed 2mg  IV, Fentanyl IV, Etomidate 20mg , Rocuronium 90mg  DL x 1 with GS 3 blade Grade 1 view 8 ET tube passed through cords under direct visualization Placement confirmed with bilateral breath sounds, positive EtCO2 change and smoke in tube   Evaluation Hemodynamic Status: BP stable throughout; O2 sats: stable throughout Patient's Current Condition: stable Complications: No apparent complications Patient did tolerate procedure well. Chest X-ray ordered to verify placement.  CXR: pending.   11/07/2014

## 2014-11-07 NOTE — Progress Notes (Signed)
eLink Physician-Brief Progress Note Patient Name: [name] DOB: July 28, 1931 MRN: [id number]   Date of Service  11/07/2014  HPI/Events of Note  Request to review CXR for central venous line placement. R IJ central venous line in distal SVC at RA/SVC junction. No pneumothorax.   eICU Interventions  OK to use R IJ central venous line for medication and fluid administration.     Intervention Category Intermediate Interventions: Diagnostic test evaluation  Camryn Quesinberry [place] 11/07/2014, 6:15 PM

## 2014-11-07 NOTE — Procedures (Signed)
Central Venous Catheter Insertion Procedure Note [name] [id number] Mar 10, 1931  Procedure: Insertion of Central Venous Catheter Indications: Assessment of intravascular volume, Drug and/or fluid administration and Frequent blood sampling  Procedure Details Consent: Risks of procedure as well as the alternatives and risks of each were explained to the (patient/caregiver).  Consent for procedure obtained. Time Out: Verified patient identification, verified procedure, site/side was marked, verified correct patient position, special equipment/implants available, medications/allergies/relevent history reviewed, required imaging and test results available.  Performed  Maximum sterile technique was used including antiseptics, cap, gloves, gown, hand hygiene, mask and sheet. Skin prep: Chlorhexidine; local anesthetic administered A antimicrobial bonded/coated triple lumen catheter was placed in the right internal jugular vein using the Seldinger technique. Ultrasound guidance used.Yes.  Wire visualized in RIJ vessel Catheter placed to 16 cm. Blood aspirated via all 3 ports and then flushed x 3. Line sutured x 2 and dressing applied.  Evaluation Blood flow good Complications: No apparent complications Patient did tolerate procedure well. Chest X-ray ordered to verify placement.  CXR: pending.  Joneen Roach, AGACNP-BC [facility] Pulmonology/Critical Care Pager [phone] or [phone]  11/07/2014 5:27 PM

## 2014-11-07 NOTE — Progress Notes (Signed)
RT assisted MD with intubation- uneventful. RN at bedside.

## 2014-11-07 NOTE — Progress Notes (Signed)
Per MD- RT removed PT from BiPAP and placed on PRB- Sp02 94%.

## 2014-11-07 NOTE — Consult Note (Signed)
PULMONARY / CRITICAL CARE MEDICINE   Name: [name] MRN: [id number] DOB: Jan 03, 1932    ADMISSION DATE:  11/06/2014 CONSULTATION DATE:  11/07/2014   REFERRING MD :  Roda Shutters  CHIEF COMPLAINT:  Shortness of breath  INITIAL PRESENTATION: 81 year old male with a past medical history significant for pulmonary fibrosis was admitted on 11/07/2014 with acute hypoxemic respiratory failure in the setting of increasing leg swelling. He has known ischemic cardiomyopathy.   STUDIES:  11/06/2014 CT angiogram chest no pulmonary embolism, there is diffuse bilateral airspace disease with small pleural effusions consistent with pulmonary edema on top of a background of significant pulmonary fibrosis. 11/07/2014 echocardiogram>   SIGNIFICANT EVENTS:    HISTORY OF PRESENT ILLNESS:  This is an 81 year old male who follows with Dr. Sherene Sires in the pulmonary office for pulmonary fibrosis. He was last seen there in May 2016 at which time he had known progression of his pulmonary fibrosis, but currently did not need oxygen therapy. He was admitted to [facility] long hospital on 11/06/2014 in the setting of worsening shortness of breath. He tells me that he has experienced hemoptysis off and on (small-volume) for several weeks which was associated with increasing shortness of breath. He's also had severe anxiety during this time. He states that his leg swelling was worsening over the last several months as well. However, his shortness of breath was worsening so he came to the emergency department on 11/06/2014. He lives in what sounds like an assisted living facility. Of note, nursing has heard that apparently he was started on 2 L of oxygen at some point recently. Details of this are not clear. Overnight he has received diuretic medication and has put out over 3 L of urine. He has remained anxious, he is still short of breath on BiPAP. He tells me that he does not feel like he's getting better.  PAST MEDICAL HISTORY :    has a past medical history of Atrial flutter (HCC); VENOUS INSUFFICIENCY, LEFT LEG; PULMONARY FIBROSIS, INTERSTITIAL; OBSTRUCTIVE SLEEP APNEA; BENIGN POSITIONAL VERTIGO; HYPERGLYCEMIA (2011); ALLERGIC RHINITIS; Rheumatoid arthritis(714.0); Chronic diastolic CHF (congestive heart failure) (HCC); PERIPHERAL NEUROPATHY; HYPERTENSION; HYPERLIPIDEMIA; DEPRESSION; Anxiety; Morbid obesity (HCC); Diverticulosis of colon (without mention of hemorrhage); Atrial fibrillation (HCC); and Pulmonary fibrosis (HCC).  has past surgical history that includes Nephrectomy (1963); Shoulder surgery (Right, 09/2009); Varicose vein surgery; Radiofrequency ablation; and Colonoscopy w/ biopsies. Prior to Admission medications   Medication Sig Start Date End Date Taking? Authorizing Provider  busPIRone (BUSPAR) 5 MG tablet Take 7.5 mg by mouth 3 (three) times daily.    Yes Historical Provider, MD  Cholecalciferol (VITAMIN D) 2000 UNITS CAPS Take 2,000 Units by mouth daily.   Yes Historical Provider, MD  dextromethorphan-guaiFENesin (MUCINEX DM) 30-600 MG per 12 hr tablet Take 1 tablet by mouth every 12 (twelve) hours as needed for cough.   Yes Historical Provider, MD  digoxin (LANOXIN) 0.125 MG tablet Take 1 tablet (0.125 mg total) by mouth daily. 12/01/13  Yes Dwana Melena, PA-C  diltiazem (CARDIZEM CD) 300 MG 24 hr capsule Take 1 capsule (300 mg total) by mouth daily. 12/01/13  Yes Dwana Melena, PA-C  famotidine (PEPCID) 20 MG tablet One at bedtime Patient taking differently: Take 20 mg by mouth at bedtime. One at bedtime 05/24/14  Yes Nyoka Cowden, MD  furosemide (LASIX) 40 MG tablet Take 40 mg by mouth 2 (two) times daily.  11/10/13  Yes Historical Provider, MD  gabapentin (NEURONTIN) 300 MG capsule Take 1 capsule (300  mg total) by mouth 3 (three) times daily. 09/26/14  Yes Veryl Speak, FNP  leflunomide (ARAVA) 20 MG tablet Take 20 mg by mouth daily.  10/08/14  Yes Historical Provider, MD  LORazepam (ATIVAN) 0.5 MG tablet  Take 0.5 mg by mouth 2 (two) times daily.   Yes Historical Provider, MD  Melatonin 5 MG CAPS Take 1 capsule by mouth at bedtime.   Yes Historical Provider, MD  memantine (NAMENDA XR) 14 MG CP24 24 hr capsule Take 14 mg by mouth daily.   Yes Historical Provider, MD  Multiple Vitamin (DAILY VITE) TABS Take 1 tablet by mouth daily.   Yes Historical Provider, MD  OXYGEN Inhale into the lungs. 2L at bedtime   Yes Historical Provider, MD  Oyster Shell 500 MG TABS Take 1,000 mg by mouth daily.    Yes Historical Provider, MD  pantoprazole (PROTONIX) 40 MG tablet Take 1 tablet (40 mg total) by mouth daily. Take 30-60 min before first meal of the day 05/24/14  Yes Nyoka Cowden, MD  predniSONE (DELTASONE) 10 MG tablet 4 x 2 days, 2 x 2 days, 1 x 2 days, then continue regular dose Patient taking differently: Take 10 mg by mouth daily.  05/24/14  Yes Nyoka Cowden, MD  psyllium (REGULOID) 0.52 G capsule Take 0.52 g by mouth at bedtime.   Yes Historical Provider, MD  QUEtiapine (SEROQUEL) 25 MG tablet Take 25 mg by mouth 2 (two) times daily.   Yes Historical Provider, MD  rivaroxaban (XARELTO) 20 MG TABS tablet Take 1 tablet (20 mg total) by mouth daily with supper. Patient taking differently: Take 20 mg by mouth daily.  12/01/13  Yes Dwana Melena, PA-C  sertraline (ZOLOFT) 50 MG tablet Take 1 tablet (50 mg total) by mouth daily. 09/26/14  Yes Veryl Speak, FNP  Witch Hazel (TUCKS MEDICATED COOLING) 50 % PADS Apply 1 patch topically. Apply externally to affected area up to 6 times daily or after each bowel movement   Yes Historical Provider, MD  memantine (NAMENDA) 10 MG tablet Take 1 tablet (10 mg total) by mouth daily. Patient not taking: Reported on 10/08/2014 02/12/14   Newt Lukes, MD   Allergies  Allergen Reactions  . Codeine     The patient states he has allergy to codeine  . Methotrexate Swelling    REACTION: ONLY HAS ONE KIDNEY, caused swelling in hands and feet    FAMILY HISTORY:   indicated that his mother is deceased. He indicated that his father is deceased.  SOCIAL HISTORY:  reports that he quit smoking about 14 years ago. His smoking use included Cigarettes. He has a 3 pack-year smoking history. He has never used smokeless tobacco. He reports that he does not drink alcohol or use illicit drugs.  REVIEW OF SYSTEMS:   Gen: Denies fever, chills, weight change, fatigue, night sweats HEENT: Denies blurred vision, double vision, hearing loss, tinnitus, sinus congestion, rhinorrhea, sore throat, neck stiffness, dysphagia PULM: per HPI CV: per HPI GI: Denies abdominal pain, nausea, vomiting, diarrhea, hematochezia, melena, constipation, change in bowel habits GU: Denies dysuria, hematuria, polyuria, oliguria, urethral discharge Endocrine: Denies hot or cold intolerance, polyuria, polyphagia or appetite change Derm: Denies rash, dry skin, scaling or peeling skin change Heme: Denies easy bruising, bleeding, bleeding gums Neuro: Denies headache, numbness, weakness, slurred speech, loss of memory or consciousness   SUBJECTIVE:   VITAL SIGNS: Temp:  [97 F (36.1 C)-98 F (36.7 C)] 97 F (36.1 C) (10/19  0800) Pulse Rate:  [36-99] 47 (10/19 1000) Resp:  [18-37] 33 (10/19 1000) BP: (139-190)/(71-166) 172/73 mmHg (10/19 1000) SpO2:  [81 %-97 %] 91 % (10/19 1000) FiO2 (%):  [60 %-80 %] 70 % (10/19 1155) Weight:  [199 lb 11.8 oz (90.6 kg)-202 lb 13.2 oz (92 kg)] 199 lb 11.8 oz (90.6 kg) (10/19 0400) HEMODYNAMICS:   VENTILATOR SETTINGS: Vent Mode:  [-] BIPAP FiO2 (%):  [60 %-80 %] 70 % Set Rate:  [8 bmp-15 bmp] 15 bmp PEEP:  [8 cmH20] 8 cmH20 INTAKE / OUTPUT:  Intake/Output Summary (Last 24 hours) at 11/07/14 1157 Last data filed at 11/07/14 1000  Gross per 24 hour  Intake    563 ml  Output   5400 ml  Net  -4837 ml    PHYSICAL EXAMINATION: General:  Elderly male chronically ill appearing Neuro:  Anxious, awake alert HEENT:  Normocephalic atraumatic on a  BiPAP mask in place Cardiovascular:  Irregularly irregular, no clear murmur on my exam, JVD is elevated Lungs:  Crackles bilaterally, increased effort, on BiPAP, accessory muscle use noted Abdomen:  Bowel sounds positive, nontender nondistended Musculoskeletal:  Normal bulk and tone Skin:  Mild skin bruising, significant edema in legs bilaterally  LABS:  CBC  Recent Labs Lab 11/06/14 1526 11/07/14 0400  WBC 13.8* 12.7*  HGB 13.2 13.8  HCT 40.3 41.2  PLT 253 253   Coag's No results for input(s): APTT, INR in the last 168 hours. BMET  Recent Labs Lab 11/06/14 1526 11/07/14 0400  NA 141 142  K 3.8 3.8  CL 103 103  CO2 29 31  BUN 16 16  CREATININE 0.79 0.87  GLUCOSE 126* 164*   Electrolytes  Recent Labs Lab 11/06/14 1526 11/07/14 0400  CALCIUM 8.5* 8.5*   Sepsis Markers  Recent Labs Lab 11/06/14 1534  LATICACIDVEN 1.21   ABG  Recent Labs Lab 11/07/14 0942  PHART 7.522*  PCO2ART 32.0*  PO2ART 61.0*   Liver Enzymes  Recent Labs Lab 11/06/14 1526  AST 27  ALT 37  ALKPHOS 80  BILITOT 1.3*  ALBUMIN 3.5   Cardiac Enzymes  Recent Labs Lab 11/06/14 2216 11/07/14 0400 11/07/14 1030  TROPONINI 0.34* 0.31* 0.29*   Glucose No results for input(s): GLUCAP in the last 168 hours.  Imaging Dg Chest 2 View  11/06/2014  CLINICAL DATA:  Short of breath EXAM: CHEST  2 VIEW COMPARISON:  10/08/2014 FINDINGS: Chronic interstitial fibrosis is noted. Progression of bilateral airspace disease compared with prior studies suggestive of superimposed acute process such as edema or bilateral pneumonia. No significant pleural effusion. Cardiac enlargement. IMPRESSION: Chronic interstitial fibrosis. Superimposed acute airspace disease bilaterally may be due to edema or pneumonia bilaterally. Electronically Signed   By: Marlan Palau M.D.   On: 11/06/2014 14:28   Ct Angio Chest Pe W/cm &/or Wo Cm  11/06/2014  CLINICAL DATA:  Dyspnea.  Hypoxia.  COPD.  Hemoptysis.  EXAM: CT ANGIOGRAPHY CHEST WITH CONTRAST TECHNIQUE: Multidetector CT imaging of the chest was performed using the standard protocol during bolus administration of intravenous contrast. Multiplanar CT image reconstructions and MIPs were obtained to evaluate the vascular anatomy. CONTRAST:  OMNIPAQUE IOHEXOL 350 MG/ML SOLN COMPARISON:  Multiple exams, including 11/06/2014 hand 08/21/2013. FINDINGS: Mediastinum/Nodes: No filling defect is identified in the pulmonary arterial tree to suggest pulmonary embolus. Coronary, aortic arch, and branch vessel atherosclerotic vascular disease. Moderate cardiomegaly. Interventricular septum thickness 2.1 cm compatible with left ventricular hypertrophy. Scattered mild mediastinal adenopathy. Index prevascular node 1.2  cm, image 37 series 4. Index right lower paratracheal node 1.7 cm, image 33 series 4. Lungs/Pleura: Chronic peripheral coarse interstitial accentuation compatible with fibrosis. Superimposed ground-glass and airspace opacities with secondary pulmonary lobular interstitial accentuation and scattered ground-glass opacities, favoring acute pulmonary edema and less likely to be due to atypical infectious process. Small bilateral pleural effusions. Upper abdomen: There is some reflux of contrast medium into the hepatic veins. Several small hypodense lesions in the lateral segment left hepatic lobe,, similar to prior exam. Musculoskeletal: 6 mm of anterolisthesis at C7-T1, not changed from 08/31/2013. 5-10% superior endplate compression fracture at the T5 vertebral level, image 93 series 10, new compared to the CT scan from 08/31/2013. A all Review of the MIP images confirms the above findings. IMPRESSION: 1. No filling defect is identified in the pulmonary arterial tree to suggest pulmonary embolus. 2. Moderate cardiomegaly with acute pulmonary edema superimposed on chronic peripheral fibrosis/usual interstitial pneumonia. 3. Left ventricular hypertrophy. 4.  Scattered mild mediastinal adenopathy, likely due to passive congestion. 5. 5-10% superior endplate compression fracture at the T5 vertebral level is new compared to 08/31/13 and probably subacute. 6. Small bilateral pleural effusions. 7. Several small hypodense lesions in the lateral segment left hepatic lobe, similar to prior CT chest. Electronically Signed   By: Gaylyn Rong M.D.   On: 11/06/2014 17:20     ASSESSMENT / PLAN:  PULMONARY A: Acute respiratory failure with hypoxemia in the setting of acute pulmonary edema with a background of idiopathic pulmonary fibrosis. Based on his elevated JVD, significant leg swelling I think that the primary cause here is acute pulmonary edema secondary to decompensated heart failure. However, I agree with the differential diagnosis grade by the hospitalist which includes an IPF flare versus pneumonia. I favor pulmonary edema based on his physical exam and lack of fever. He remains to On BiPAP and is at high risk for respiratory failure and need of intubation and mechanical ventilation P:   Anxiolytic medications now Repeat diuresis now Continue BiPAP now Will reassess after receiving anxiolytic medications and further diuresis, may need intubation  CARDIOVASCULAR A: Acute decompensated heart failure, systolic A. fib with RVR Frequent ectopy on telemetry monitoring P:  Will increase afterload reduction with when necessary hydralazine order We will give an extra dose of Lasix Cardiology evaluation for A. fib, ectopy, and heart failure Follow-up echo  RENAL A:  Mild hypokalemia in the setting of diuresis P:   Replacement potassium now Continue diuretics Monitor BMET and UOP Replace electrolytes as needed   GASTROINTESTINAL A:  No acute issues P:   Nothing by mouth while on BiPAP  HEMATOLOGIC A:  No acute issues P:  Monitor for bleeding  INFECTIOUS A:  Possible healthcare associated pneumonia P:   BCx2 October 18  11/06/2014  vancomycin>> 11/06/2014 Ceftazidime>>  ENDOCRINE A:  Hyperglycemia P:   Per [facility]  NEUROLOGIC A:  Severe anxiety P:   Increase anxiolytic with Ativan   FAMILY  - Updates: No family at bedside, apparently there is a sister.  - Inter-disciplinary family meet or Palliative Care meeting due by:   11/13/2014   CODE STATUS: Currently full code. I discussed the situation with the patient. I think that this is hopefully a short-term problem (acute pulmonary edema), but considering his background idiopathic pulmonary fibrosis he certainly is at risk for prolonged respiratory failure. He tells me that he would be okay with short-term mechanical ventilation if necessary. I think that's not completely unreasonable considering the fact that he  is on minimal oxygen support at baseline.  CC time 40 minutes  Heber Leary, MD [facility] Pager: [phone] Cell: [phone] After 3pm or if no response, call [phone]  11/07/2014, 11:57 AM

## 2014-11-07 NOTE — Progress Notes (Signed)
RT removed PT from BiPAP an placed on [place]- PT suffered from severe SOB and Sp02 dropped to 79% quickly- RN aware.

## 2014-11-07 NOTE — Progress Notes (Signed)
*  PRELIMINARY RESULTS* Echocardiogram 2D Echocardiogram has been performed.  Jeryl Columbia 11/07/2014, 11:08 AM

## 2014-11-07 NOTE — Progress Notes (Signed)
PROGRESS NOTE  [name] BUL:[id number] DOB: 30-Dec-1931 DOA: 11/06/2014 PCP: [name], MD  HPI/Recap of past 24 hours:  Tachypneic, increased work of breathing, restless on bipap, 4liter of urine overnight,  Assessment/Plan: Principal Problem:   Acute on chronic systolic CHF (congestive heart failure) (HCC) Active Problems:   Essential hypertension   Atrial flutter (HCC)   IPF (idiopathic pulmonary fibrosis) (HCC)   Acute respiratory failure with hypoxia (HCC)   Acute CHF (congestive heart failure) (HCC)   Respiratory failure (HCC)  Hypoxic respiratory failure: likely multifactorial including acute on chronic diastolic and systolic chf ( EF45-50%), pulmonary fibrosis, +/- superimposed pneumonia. cxr bilateral diffuse infiltrate, diuresed with 4liter urine, however, no improvement in respiratory status. Continue lasix/steroids/abx/nebs/bipap, critical care consulted.  Elevation of troponin, likely demand ischemia, cardiology following  Chronic afib: on cardizem/xarelto/dig, cardiology following   Code Status: full  Family Communication: patient   Disposition Plan: pending, likely needing intubation   Consultants:  Critical care  cardiology   Antibiotics:  vanc/fortaz   Objective: BP 129/104 mmHg  Pulse 92  Temp(Src) 97 F (36.1 C) (Axillary)  Resp 25  Ht 5\' 8"  (1.727 m)  Wt 199 lb 11.8 oz (90.6 kg)  BMI 30.38 kg/m2  SpO2 94%  Intake/Output Summary (Last 24 hours) at 11/07/14 1657 Last data filed at 11/07/14 1459  Gross per 24 hour  Intake    783 ml  Output   6100 ml  Net  -5317 ml   Filed Weights   11/06/14 2025 11/07/14 0400  Weight: 202 lb 13.2 oz (92 kg) 199 lb 11.8 oz (90.6 kg)    Exam:   General:  tachypneic  Cardiovascular: IRRR  Respiratory: very diminished, mild wheezing, +rales,   Abdomen: Soft/ND/NT, positive BS  Musculoskeletal: 3+pitting  Edema bilateral lower extremity edema  Neuro: agitated, following  command.  Data Reviewed: Basic Metabolic Panel:  Recent Labs Lab 11/06/14 1526 11/07/14 0400  NA 141 142  K 3.8 3.8  CL 103 103  CO2 29 31  GLUCOSE 126* 164*  BUN 16 16  CREATININE 0.79 0.87  CALCIUM 8.5* 8.5*   Liver Function Tests:  Recent Labs Lab 11/06/14 1526  AST 27  ALT 37  ALKPHOS 80  BILITOT 1.3*  PROT 6.7  ALBUMIN 3.5   No results for input(s): LIPASE, AMYLASE in the last 168 hours. No results for input(s): AMMONIA in the last 168 hours. CBC:  Recent Labs Lab 11/06/14 1526 11/07/14 0400  WBC 13.8* 12.7*  NEUTROABS 12.4*  --   HGB 13.2 13.8  HCT 40.3 41.2  MCV 99.5 100.5*  PLT 253 253   Cardiac Enzymes:    Recent Labs Lab 11/06/14 1828 11/06/14 2216 11/07/14 0400 11/07/14 1030  TROPONINI 0.33* 0.34* 0.31* 0.29*   BNP (last 3 results)  Recent Labs  02/21/14 1405 10/08/14 1937 11/06/14 1526  BNP 178.7* 206.1* 240.2*    ProBNP (last 3 results)  Recent Labs  11/15/13 2139 05/24/14 1631  PROBNP 4542.0* 223.0*    CBG: No results for input(s): GLUCAP in the last 168 hours.  Recent Results (from the past 240 hour(s))  Blood culture (routine x 2)     Status: None (Preliminary result)   Collection Time: 11/06/14  6:20 PM  Result Value Ref Range Status   Specimen Description BLOOD RIGHT FOREARM  Final   Special Requests BOTTLES DRAWN AEROBIC AND ANAEROBIC  Final   Culture   Final    NO GROWTH < 24 HOURS Performed  at [facility]    Report Status PENDING  Incomplete  Blood culture (routine x 2)     Status: None (Preliminary result)   Collection Time: 11/06/14  6:53 PM  Result Value Ref Range Status   Specimen Description BLOOD LEFT FOREARM  Final   Special Requests BOTTLES DRAWN AEROBIC AND ANAEROBIC  Final   Culture   Final    NO GROWTH < 24 HOURS Performed at [facility]    Report Status PENDING  Incomplete  MRSA PCR Screening     Status: None   Collection Time: 11/06/14  8:57 PM  Result Value  Ref Range Status   MRSA by PCR NEGATIVE NEGATIVE Final    Comment:        The GeneXpert MRSA Assay (FDA approved for NASAL specimens only), is one component of a comprehensive MRSA colonization surveillance program. It is not intended to diagnose MRSA infection nor to guide or monitor treatment for MRSA infections.      Studies: Ct Angio Chest Pe W/cm &/or Wo Cm  11/06/2014  CLINICAL DATA:  Dyspnea.  Hypoxia.  COPD.  Hemoptysis. EXAM: CT ANGIOGRAPHY CHEST WITH CONTRAST TECHNIQUE: Multidetector CT imaging of the chest was performed using the standard protocol during bolus administration of intravenous contrast. Multiplanar CT image reconstructions and MIPs were obtained to evaluate the vascular anatomy. CONTRAST:  OMNIPAQUE IOHEXOL 350 MG/ML SOLN COMPARISON:  Multiple exams, including 11/06/2014 hand 08/21/2013. FINDINGS: Mediastinum/Nodes: No filling defect is identified in the pulmonary arterial tree to suggest pulmonary embolus. Coronary, aortic arch, and branch vessel atherosclerotic vascular disease. Moderate cardiomegaly. Interventricular septum thickness 2.1 cm compatible with left ventricular hypertrophy. Scattered mild mediastinal adenopathy. Index prevascular node 1.2 cm, image 37 series 4. Index right lower paratracheal node 1.7 cm, image 33 series 4. Lungs/Pleura: Chronic peripheral coarse interstitial accentuation compatible with fibrosis. Superimposed ground-glass and airspace opacities with secondary pulmonary lobular interstitial accentuation and scattered ground-glass opacities, favoring acute pulmonary edema and less likely to be due to atypical infectious process. Small bilateral pleural effusions. Upper abdomen: There is some reflux of contrast medium into the hepatic veins. Several small hypodense lesions in the lateral segment left hepatic lobe,, similar to prior exam. Musculoskeletal: 6 mm of anterolisthesis at C7-T1, not changed from 08/31/2013. 5-10% superior endplate  compression fracture at the T5 vertebral level, image 93 series 10, new compared to the CT scan from 08/31/2013. A all Review of the MIP images confirms the above findings. IMPRESSION: 1. No filling defect is identified in the pulmonary arterial tree to suggest pulmonary embolus. 2. Moderate cardiomegaly with acute pulmonary edema superimposed on chronic peripheral fibrosis/usual interstitial pneumonia. 3. Left ventricular hypertrophy. 4. Scattered mild mediastinal adenopathy, likely due to passive congestion. 5. 5-10% superior endplate compression fracture at the T5 vertebral level is new compared to 08/31/13 and probably subacute. 6. Small bilateral pleural effusions. 7. Several small hypodense lesions in the lateral segment left hepatic lobe, similar to prior CT chest. Electronically Signed   By: Gaylyn Rong M.D.   On: 11/06/2014 17:20    Scheduled Meds: . [START ON 11/08/2014] antiseptic oral rinse  7 mL Mouth Rinse QID  . busPIRone  7.5 mg Oral TID  . cefTAZidime (FORTAZ)  IV  1 g Intravenous Q8H  . chlorhexidine gluconate  15 mL Mouth Rinse BID  . digoxin  0.125 mg Oral Daily  . diltiazem  10 mg Intravenous Once  . etomidate      . famotidine (PEPCID) IV  20 mg Intravenous Q12H  . fentaNYL      . fentaNYL (SUBLIMAZE) injection  50 mcg Intravenous Once  . furosemide  60 mg Intravenous Q6H  . gabapentin  300 mg Oral TID  . lidocaine (cardiac) 100 mg/29ml      . LORazepam  0.5 mg Intravenous BID  . [START ON 11/08/2014] methylPREDNISolone (SOLU-MEDROL) injection  40 mg Intravenous Daily  . midazolam      . pantoprazole (PROTONIX) IV  40 mg Intravenous Q24H  . QUEtiapine  25 mg Oral BID  . rivaroxaban  20 mg Oral Daily  . rocuronium      . sertraline  50 mg Oral Daily  . sodium chloride  3 mL Intravenous Q12H  . succinylcholine      . vancomycin  1,000 mg Intravenous Q12H    Continuous Infusions: . diltiazem (CARDIZEM) infusion    . fentaNYL infusion INTRAVENOUS       Time  spent:  Shatasha Lambing MD, PhD  [facility] Pager [phone]. If 7PM-7AM, please contact night-coverage at [email or web address], password [facility] 11/07/2014, 4:57 PM  LOS: 1 day

## 2014-11-07 NOTE — Progress Notes (Signed)
eLink Physician-Brief Progress Note Patient Name: FREDI GEILER DOB: [birth year] MRN: [id number]   Date of Service  11/07/2014  HPI/Events of Note  Frequent ventricular ectopy with 6 beat run of NSVT. Currently on Cardizem IV infusion for AFIB.  eICU Interventions  Will order: 1. BMP and Mg++ level now.      Intervention Category Major Interventions: Arrhythmia - evaluation and management  Rodgers Likes Eugene 11/07/2014, 8:42 PM

## 2014-11-07 NOTE — Clinical Documentation Improvement (Signed)
Cardiology  Can the diagnosis of Atrial Fibrillation be further specified? Thank you   Chronic Atrial fibrillation  Paroxysmal Atrial fibrillation  Permanent Atrial fibrillation  Persistent Atrial fibrillation  Other  Clinically Undetermined       Please exercise your independent, professional judgment when responding. A specific answer is not anticipated or expected.   Thank You,  Lavonda Jumbo Health Information Management  [phone]

## 2014-11-07 NOTE — Care Management Note (Signed)
Case Management Note  Patient Details  Name: [name] MRN: [id number] Date of Birth: [birth year]  Subjective/Objective:     chf and pulmonary edema               Action/Plan:Date: November 07, 2014 Chart reviewed for concurrent status and case management needs. Will continue to follow patient for changes and needs: Marcelle Smiling, RN, BSN, [place]   [phone]   Expected Discharge Date:   (UNKNOWN)               Expected Discharge Plan:  Home/Self Care  In-House Referral:  NA  Discharge planning Services  CM Consult  Post Acute Care Choice:  Home Health Choice offered to:  Patient  DME Arranged:    DME Agency:     HH Arranged:  Disease Management HH Agency:  [facility]  Status of Service:  In process, will continue to follow  Medicare Important Message Given:    Date Medicare IM Given:    Medicare IM give by:    Date Additional Medicare IM Given:    Additional Medicare Important Message give by:     If discussed at Long Length of Stay Meetings, dates discussed:    Additional Comments:  Golda Acre, RN 11/07/2014, 10:27 AM

## 2014-11-07 NOTE — Progress Notes (Signed)
Patient Profile: Patient is a 81 y.o. male with a PMHx of atrial fib, pulmonary fibrosis, Chronic diastolic CHF, HTN, OSA, , who was admitted to [facility] on 11/06/2014 for evaluation of worsening dyspnea. He was found to have a positive Troponin and we were asked to see. Also with runs of NSVT on telemetry.   Subjective: Continued issues with dyspnea. Now on BIPAP.   Objective: Vital signs in last 24 hours: Temp:  [97 F (36.1 C)-98 F (36.7 C)] 97 F (36.1 C) (10/19 0800) Pulse Rate:  [36-99] 36 (10/19 0420) Resp:  [18-37] 22 (10/19 0420) BP: (139-188)/(71-124) 188/113 mmHg (10/19 0842) SpO2:  [81 %-97 %] 91 % (10/19 0420) FiO2 (%):  [60 %-80 %] 70 % (10/19 0757) Weight:  [199 lb 11.8 oz (90.6 kg)-202 lb 13.2 oz (92 kg)] 199 lb 11.8 oz (90.6 kg) (10/19 0400) Last BM Date: 11/06/14  Intake/Output from previous day: 10/18 0701 - 10/19 0700 In: 450 [I.V.:200; IV Piggyback:250] Out: 4200 [Urine:4200] Intake/Output this shift:    Medications Current Facility-Administered Medications  Medication Dose Route Frequency Provider Last Rate Last Dose  . 0.9 %  sodium chloride infusion  250 mL Intravenous PRN Jeralyn Bennett, MD      . acetaminophen (TYLENOL) tablet 650 mg  650 mg Oral Q4H PRN Jeralyn Bennett, MD      . busPIRone (BUSPAR) tablet 7.5 mg  7.5 mg Oral TID Jeralyn Bennett, MD   7.5 mg at 11/06/14 2109  . cefTAZidime (FORTAZ) 1 g in dextrose 5 % 50 mL IVPB  1 g Intravenous Q8H Anh P Pham, RPH   1 g at 11/07/14 0127  . digoxin (LANOXIN) tablet 0.125 mg  0.125 mg Oral Daily Jeralyn Bennett, MD      . diltiazem (CARDIZEM CD) 24 hr capsule 300 mg  300 mg Oral Daily Jeralyn Bennett, MD      . furosemide (LASIX) injection 60 mg  60 mg Intravenous BID Jeralyn Bennett, MD   60 mg at 11/07/14 0842  . gabapentin (NEURONTIN) capsule 300 mg  300 mg Oral TID Jeralyn Bennett, MD   300 mg at 11/06/14 2110  . hydrALAZINE (APRESOLINE) injection 10 mg  10 mg Intravenous Q6H PRN Albertine Grates, MD    10 mg at 11/07/14 0847  . levalbuterol (XOPENEX) nebulizer solution 1.25 mg  1.25 mg Nebulization Q6H PRN Jeralyn Bennett, MD      . LORazepam (ATIVAN) injection 0.5 mg  0.5 mg Intravenous BID Jeralyn Bennett, MD      . methylPREDNISolone sodium succinate (SOLU-MEDROL) 125 mg/2 mL injection 60 mg  60 mg Intravenous Q6H Jeralyn Bennett, MD   60 mg at 11/07/14 0848  . ondansetron (ZOFRAN) injection 4 mg  4 mg Intravenous Q6H PRN Jeralyn Bennett, MD      . pantoprazole (PROTONIX) injection 40 mg  40 mg Intravenous Q24H Albertine Grates, MD      . QUEtiapine (SEROQUEL) tablet 25 mg  25 mg Oral BID Jeralyn Bennett, MD   25 mg at 11/06/14 2110  . rivaroxaban (XARELTO) tablet 20 mg  20 mg Oral Daily Jeralyn Bennett, MD      . sertraline (ZOLOFT) tablet 50 mg  50 mg Oral Daily Jeralyn Bennett, MD      . sodium chloride 0.9 % injection 3 mL  3 mL Intravenous Q12H Jeralyn Bennett, MD   3 mL at 11/06/14 2111  . sodium chloride 0.9 % injection 3 mL  3 mL Intravenous PRN Jeralyn Bennett, MD      .  vancomycin (VANCOCIN) IVPB 1000 mg/200 mL premix  1,000 mg Intravenous Q12H Anh P Pham, RPH   1,000 mg at 11/07/14 0507    PE: General appearance: alert, cooperative and on BIPAP Neck: no carotid bruit and no JVD Lungs: bilateral diffuse wheezing Heart: regular rate and rhythm and with PVCs Extremities: 1+ bilateral pitting edema Pulses: 2+ and symmetric Skin: warm and dry Neurologic: Grossly normal  Lab Results:   Recent Labs  11/06/14 1526 11/07/14 0400  WBC 13.8* 12.7*  HGB 13.2 13.8  HCT 40.3 41.2  PLT 253 253   BMET  Recent Labs  11/06/14 1526 11/07/14 0400  NA 141 142  K 3.8 3.8  CL 103 103  CO2 29 31  GLUCOSE 126* 164*  BUN 16 16  CREATININE 0.79 0.87  CALCIUM 8.5* 8.5*   Cardiac Panel (last 3 results)  Recent Labs  11/06/14 1828 11/06/14 2216 11/07/14 0400  TROPONINI 0.33* 0.34* 0.31*    Studies/Results: 2D Echo- pending   Assessment/Plan  Principal Problem:   Acute on  chronic systolic CHF (congestive heart failure) (HCC) Active Problems:   Essential hypertension   Atrial flutter (HCC)   IPF (idiopathic pulmonary fibrosis) (HCC)   Acute respiratory failure with hypoxia (HCC)   Acute CHF (congestive heart failure) (HCC)   Respiratory failure (HCC)   1. + Troponin:flat trend 0.33->0.34-->0.31.  Likely due to strain . Symptoms are not c/w ACS. He is a poor candidate for interventional procedures. No plans for stress testing at this point.   2. Atrial fib: rate in the 90s-low 100s. Continue Digoxin, and Xarelto. He is currently on Cardizem which is not ideal in the setting of reduced LV systolic function, however given his wheezing, it is doubtfull that he would tolerate beta blockers. So we will continue the Diltiazem for now.  3. Pulmonary fibrosis: Suspect this is the primary cause of his hypoxemia and pulmonary HTN. Sees Dr. Sherene Sires.   4. Moderate pulmonary Hypertension: Continue lasix for now. There has been issues with compliance with salt restriction in the past.   5. Chronic systolic CHF: Has an EF of 45% as of echo in Feb. 2016. Repeat echo pending. Continue lasix for edema. Doubt he would tolerate beta blocker very well. Recommend starting low dose Losartan, which will also help with BP control.   6. HTN: poorly controlled in the 180s systolic. Recommend adding losartan to regimen, which will also help with his systolic dysfunction.   7. NSVT: patient noted to have several runs of NSVT in the 150s. HR currently in the 90s-low 100s. He is a poor candidate for BB therapy given his pulmonary fibrosis. Continue to monitor on telemetry. MD to assess and will provide further recommendations. Repeat 2D echo pending to reassess EF.     LOS: 1 day    Brittainy M. Delmer Islam 11/07/2014 8:55 AM  Attending Note:   The patient was seen and examined.  Agree with assessment and plan as noted above.  Changes made to the above note as  needed.  Echo shows:  - Left ventricle: The cavity size was normal. There was severe concentric hypertrophy. Systolic function was mildly reduced. The estimated ejection fraction was in the range of 45% to 50%. Diffuse hypokinesis with no identifiable regional variations. Regional wall motion abnormalities cannot be excluded. - Mitral valve: There was mild to moderate regurgitation. - Left atrium: The atrium was mildly dilated. - Right ventricle: The cavity size was moderately dilated. Wall thickness was normal. - Pulmonary  arteries: Systolic pressure was moderately increased. PA peak pressure: 50 mm Hg  LV function is basically unchanged from previous echo   I suspect that his primary issue is that of pulmonary fibrosis and subsequent pulmonary hypertension   Vesta Mixer, Montez Hageman., MD, [facility] 11/07/2014, 3:56 PM [address]  [address] Office [phone] Pager [phone]

## 2014-11-08 DIAGNOSIS — E876 Hypokalemia: Secondary | ICD-10-CM

## 2014-11-08 DIAGNOSIS — R32 Unspecified urinary incontinence: Secondary | ICD-10-CM | POA: Diagnosis not present

## 2014-11-08 LAB — GLUCOSE, CAPILLARY
GLUCOSE-CAPILLARY: 136 mg/dL — AB (ref 65–99)
GLUCOSE-CAPILLARY: 137 mg/dL — AB (ref 65–99)
Glucose-Capillary: 154 mg/dL — ABNORMAL HIGH (ref 65–99)

## 2014-11-08 LAB — CBC WITH DIFFERENTIAL/PLATELET
Basophils Absolute: 0 10*3/uL (ref 0.0–0.1)
Basophils Relative: 0 %
EOS ABS: 0 10*3/uL (ref 0.0–0.7)
EOS PCT: 0 %
HCT: 39.3 % (ref 39.0–52.0)
Hemoglobin: 12.9 g/dL — ABNORMAL LOW (ref 13.0–17.0)
LYMPHS ABS: 0.5 10*3/uL — AB (ref 0.7–4.0)
Lymphocytes Relative: 3 %
MCH: 32.7 pg (ref 26.0–34.0)
MCHC: 32.8 g/dL (ref 30.0–36.0)
MCV: 99.7 fL (ref 78.0–100.0)
MONOS PCT: 5 %
Monocytes Absolute: 0.9 10*3/uL (ref 0.1–1.0)
Neutro Abs: 15.6 10*3/uL — ABNORMAL HIGH (ref 1.7–7.7)
Neutrophils Relative %: 92 %
PLATELETS: 216 10*3/uL (ref 150–400)
RBC: 3.94 MIL/uL — ABNORMAL LOW (ref 4.22–5.81)
RDW: 17.1 % — ABNORMAL HIGH (ref 11.5–15.5)
WBC: 17 10*3/uL — ABNORMAL HIGH (ref 4.0–10.5)

## 2014-11-08 LAB — BASIC METABOLIC PANEL
ANION GAP: 9 (ref 5–15)
BUN: 28 mg/dL — ABNORMAL HIGH (ref 6–20)
CHLORIDE: 102 mmol/L (ref 101–111)
CO2: 30 mmol/L (ref 22–32)
Calcium: 8.7 mg/dL — ABNORMAL LOW (ref 8.9–10.3)
Creatinine, Ser: 1.1 mg/dL (ref 0.61–1.24)
GFR calc Af Amer: >60 mL/min (ref >60)
GFR calc non Af Amer: >60 mL/min (ref >60)
GLUCOSE: 155 mg/dL — AB (ref 65–99)
POTASSIUM: 3.9 mmol/L (ref 3.5–5.1)
Sodium: 141 mmol/L (ref 135–145)

## 2014-11-08 LAB — BLOOD GAS, ARTERIAL
Acid-Base Excess: 2.8 mmol/L — ABNORMAL HIGH (ref 0.0–2.0)
BICARBONATE: 26.9 meq/L — AB (ref 20.0–24.0)
DRAWN BY: 441261
FIO2: 0.6
O2 Saturation: 80.7 %
PCO2 ART: 41.4 mmHg (ref 35.0–45.0)
PEEP: 8 cmH2O
Patient temperature: 98.6
RATE: 18 resp/min
TCO2: 24 mmol/L (ref 0–100)
VT: 550 mL
pH, Arterial: 7.429 (ref 7.350–7.450)
pO2, Arterial: 49.8 mmHg — ABNORMAL LOW (ref 80.0–100.0)

## 2014-11-08 LAB — VANCOMYCIN, TROUGH: VANCOMYCIN TR: 28 ug/mL — AB (ref 10.0–20.0)

## 2014-11-08 LAB — MAGNESIUM: Magnesium: 2.2 mg/dL (ref 1.7–2.4)

## 2014-11-08 MED ORDER — VITAL AF 1.2 CAL PO LIQD
1000.0000 mL | ORAL | Status: DC
  Administered 2014-11-08 – 2014-11-13 (×6): 1000 mL
  Filled 2014-11-08 (×9): qty 1000

## 2014-11-08 MED ORDER — POTASSIUM CHLORIDE 20 MEQ/15ML (10%) PO SOLN
40.0000 meq | Freq: Once | ORAL | Status: AC
  Administered 2014-11-08: 40 meq
  Filled 2014-11-08: qty 30

## 2014-11-08 MED ORDER — FREE WATER
100.0000 mL | Freq: Three times a day (TID) | Status: DC
  Administered 2014-11-08 – 2014-11-12 (×11): 100 mL

## 2014-11-08 MED ORDER — INSULIN ASPART 100 UNIT/ML ~~LOC~~ SOLN
0.0000 [IU] | SUBCUTANEOUS | Status: DC
  Administered 2014-11-08: 2 [IU] via SUBCUTANEOUS
  Administered 2014-11-08 – 2014-11-09 (×3): 1 [IU] via SUBCUTANEOUS
  Administered 2014-11-09: 2 [IU] via SUBCUTANEOUS
  Administered 2014-11-09 – 2014-11-10 (×4): 1 [IU] via SUBCUTANEOUS
  Administered 2014-11-10 (×2): 2 [IU] via SUBCUTANEOUS
  Administered 2014-11-10 (×2): 1 [IU] via SUBCUTANEOUS
  Administered 2014-11-10 – 2014-11-11 (×2): 2 [IU] via SUBCUTANEOUS
  Administered 2014-11-11: 1 [IU] via SUBCUTANEOUS
  Administered 2014-11-11: 2 [IU] via SUBCUTANEOUS
  Administered 2014-11-11 – 2014-11-12 (×3): 1 [IU] via SUBCUTANEOUS
  Administered 2014-11-12: 2 [IU] via SUBCUTANEOUS
  Administered 2014-11-12: 1 [IU] via SUBCUTANEOUS
  Administered 2014-11-12 – 2014-11-13 (×3): 2 [IU] via SUBCUTANEOUS
  Administered 2014-11-13 – 2014-11-15 (×6): 1 [IU] via SUBCUTANEOUS

## 2014-11-08 MED ORDER — VITAL HIGH PROTEIN PO LIQD: 1000.0000 mL | ORAL | Status: DC

## 2014-11-08 MED ORDER — PRO-STAT SUGAR FREE PO LIQD
30.0000 mL | Freq: Two times a day (BID) | ORAL | Status: DC
  Administered 2014-11-08 – 2014-11-13 (×9): 30 mL
  Filled 2014-11-08 (×9): qty 30

## 2014-11-08 MED ORDER — VANCOMYCIN HCL IN DEXTROSE 1-5 GM/200ML-% IV SOLN
1000.0000 mg | INTRAVENOUS | Status: DC
Start: 2014-11-09 — End: 2014-11-09

## 2014-11-08 MED ORDER — FUROSEMIDE 10 MG/ML IJ SOLN
40.0000 mg | Freq: Four times a day (QID) | INTRAMUSCULAR | Status: AC
  Administered 2014-11-08 (×2): 40 mg via INTRAVENOUS
  Filled 2014-11-08 (×2): qty 4

## 2014-11-08 NOTE — Progress Notes (Signed)
Date: November 08, 2014 Chart reviewed for concurrent status and case management needs. Will continue to follow patient for changes and needs: Intubated pm of [id number]. Marcelle Smiling, RN, BSN, [place]   [phone]

## 2014-11-08 NOTE — Progress Notes (Signed)
ANTIBIOTIC CONSULT NOTE - follow up  Pharmacy Consult for vancomycin and ceftazidime  Indication: pneumonia  Allergies  Allergen Reactions  . Codeine     The patient states he has allergy to codeine  . Methotrexate Swelling    REACTION: ONLY HAS ONE KIDNEY, caused swelling in hands and feet    Patient Measurements: weight 89 kg, height 68 inches Height: 5\' 8"  (172.7 cm) Weight: 196 lb 3.4 oz (89 kg) IBW/kg (Calculated) : 68.4 Vital Signs: Temp: 95.4 F (35.2 C) (10/20 0800) Temp Source: Rectal (10/20 0800) BP: 109/61 mmHg (10/20 1000) Pulse Rate: 68 (10/20 1000) Intake/Output from previous day: 10/19 0701 - 10/20 0700 In: 1506.3 [I.V.:246.3; NG/GT:30; IV Piggyback:800] Out: 1900 [Urine:1900] Intake/Output from this shift: Total I/O In: 270 [I.V.:50; NG/GT:120; IV Piggyback:100] Out: -   Labs:  Recent Labs  11/06/14 1526 11/07/14 0400 11/07/14 2050 11/08/14 0539 11/08/14 1153  WBC 13.8* 12.7*  --   --  17.0*  HGB 13.2 13.8  --   --  12.9*  PLT 253 253  --   --  216  CREATININE 0.79 0.87 0.92 1.10  --    Estimated Creatinine Clearance: 57.1 mL/min (by C-G formula based on Cr of 1.1). No results for input(s): VANCOTROUGH, VANCOPEAK, VANCORANDOM, GENTTROUGH, GENTPEAK, GENTRANDOM, TOBRATROUGH, TOBRAPEAK, TOBRARND, AMIKACINPEAK, AMIKACINTROU, AMIKACIN in the last 72 hours.   Microbiology: Recent Results (from the past 720 hour(s))  Blood culture (routine x 2)     Status: None (Preliminary result)   Collection Time: 11/06/14  6:20 PM  Result Value Ref Range Status   Specimen Description BLOOD RIGHT FOREARM  Final   Special Requests BOTTLES DRAWN AEROBIC AND ANAEROBIC 11/08/14  Final   Culture   Final    NO GROWTH 2 DAYS Performed at [facility]    Report Status PENDING  Incomplete  Blood culture (routine x 2)     Status: None (Preliminary result)   Collection Time: 11/06/14  6:53 PM  Result Value Ref Range Status   Specimen Description BLOOD LEFT FOREARM   Final   Special Requests BOTTLES DRAWN AEROBIC AND ANAEROBIC 11/08/14  Final   Culture   Final    NO GROWTH 2 DAYS Performed at [facility]    Report Status PENDING  Incomplete  MRSA PCR Screening     Status: None   Collection Time: 11/06/14  8:57 PM  Result Value Ref Range Status   MRSA by PCR NEGATIVE NEGATIVE Final    Comment:        The GeneXpert MRSA Assay (FDA approved for NASAL specimens only), is one component of a comprehensive MRSA colonization surveillance program. It is not intended to diagnose MRSA infection nor to guide or monitor treatment for MRSA infections.     Medical History: Past Medical History  Diagnosis Date  . Atrial flutter (HCC)     a. s/p ablation for RVR  03/2009, Xarelto ongoing  . VENOUS INSUFFICIENCY, LEFT LEG   . PULMONARY FIBROSIS, INTERSTITIAL   . OBSTRUCTIVE SLEEP APNEA   . BENIGN POSITIONAL VERTIGO   . HYPERGLYCEMIA 2011  . ALLERGIC RHINITIS   . Rheumatoid arthritis(714.0)   . Chronic diastolic CHF (congestive heart failure) (HCC)     a. 10/2013 Echo: EF 45-50%, mild TR, mildly dil LA/RA.  11/2013 PERIPHERAL NEUROPATHY   . HYPERTENSION   . HYPERLIPIDEMIA   . DEPRESSION     [facility] 01/2011 for SI  . Anxiety   . Morbid obesity (HCC)   .  Diverticulosis of colon (without mention of hemorrhage)   . Atrial fibrillation (HCC)     a. chronic xarelto;  b. prev on amio->d/c'd 2/2 pulmonary fibrosis.  . Pulmonary fibrosis [facility])     Assessment: Patient's an 81 y.o F who presented to the ED from [facility] with c/o SOB.  CXR is suspicious for PNA.  Patient with acute respiratory failure with hypoxemia in the setting of acute pulmonary edema with a background of idiopathic pulmonary fibrosis.  Pharmacy to dose vancomycin and ceftazidime for r/o PNA.  Goal of Therapy:  Vancomycin trough level 15-20 mcg/ml  Plan:  - ceftazidime 1gm IV q8h - vancomycin 1gm IV q12h -vanc trough tonight-adjust dose accordingly  Arley Phenix  RPh 11/08/2014, 1:25 PM Pager [phone]

## 2014-11-08 NOTE — Progress Notes (Addendum)
CRITICAL VALUE ALERT  Critical value received:  Vancomycin trough 28  Date of notification:  11/08/14  Time of notification:  1735  Critical value read back:Yes.    Nurse who received alert:  Santiago Glad, RN  MD notified (1st page):  Dr. Dellie Catholic, pharmacy also notified  Time of first page:  1236

## 2014-11-08 NOTE — Progress Notes (Addendum)
NUTRITION NOTE   New consult received for RD to initiate and manage TF. Full RD assessment note today @ 1025.  Will order: Vital AF 1.2 @ 50 mL/hr with 30 mL Prostat once/day and 100 mL free water Q8h to provide 1540 kcal (92% estimated needs), 105 grams protein, and 1273 mL free water.  Will continue to follow per protocol.     Trenton Gammon, RD, LDN Inpatient Clinical Dietitian Pager # [phone] After hours/weekend pager # [phone]

## 2014-11-08 NOTE — Progress Notes (Signed)
ANTIBIOTIC CONSULT NOTE - Follow Up  Pharmacy Consult for Vancomycin   Indication: pneumonia  Vancomycin trough = 28 mcg/ml (goal = 15-20 mcg/ml) SCr steadily rising = 0.79 > 0.87 > 0.92 > 1.10  Plan:  Hold tonight's vancomycin dose  Decrease vancomycin to 1g IV q24h - next dose tomorrow at 10AM  Per CCM note, doubt HCAP - can abx be stopped or narrowed?  Loralee Pacas, PharmD, BCPS Pager: [phone] 11/08/2014 5:44 PM

## 2014-11-08 NOTE — Progress Notes (Signed)
Performed bladder scan due to oliguria.  Bladder scan revealed a total urine volume of .

## 2014-11-08 NOTE — Progress Notes (Signed)
Patient Profile: Patient is a 81 y.o. male with a PMHx of atrial fib, pulmonary fibrosis, Chronic diastolic CHF, HTN, OSA, , who was admitted to [facility] on 11/06/2014 for evaluation of worsening dyspnea. He was found to have a positive Troponin and we were asked to see. Also with runs of NSVT on telemetry.   11/07/14 increased Resp distress and intubated last pm    Subjective: Sedated on vent  Objective: Vital signs in last 24 hours: Temp:  [97 F (36.1 C)-97.6 F (36.4 C)] 97.6 F (36.4 C) (10/20 0503) Pulse Rate:  [38-157] 76 (10/20 0700) Resp:  [14-34] 17 (10/20 0700) BP: (83-211)/(38-153) 122/57 mmHg (10/20 0700) SpO2:  [80 %-99 %] 93 % (10/20 0700) FiO2 (%):  [40 %-100 %] 50 % (10/20 0505) Weight:  [196 lb 3.4 oz (89 kg)] 196 lb 3.4 oz (89 kg) (10/20 0503) Weight change: -6 lb 9.8 oz (-3 kg) Last BM Date: 11/06/14 Intake/Output from previous day:  -4,143 since admit 10/19 0701 - 10/20 0700 In: 1506.3 [I.V.:246.3; NG/GT:30; IV Piggyback:800] Out: 1900 [Urine:1900] Intake/Output this shift:    PE: General:sedated, resting on vent Skin:Warm and dry, brisk capillary refill HEENT:normocephalic, sclera clear, mucus membranes moist Heart:irreg irreg without murmur, gallup, rub or click Lungs:clear, ant, without rales, rhonchi, or wheezes HWT:UUEK, non tender, + BS, do not palpate liver spleen or masses Ext:2-3+ lower ext edema,  2+ radial pulses Neuro:sedated on vent Tele:  A fib with controlled rate, episodic NSVT up to 33 beats.    Lab Results:  Recent Labs  11/06/14 1526 11/07/14 0400  WBC 13.8* 12.7*  HGB 13.2 13.8  HCT 40.3 41.2  PLT 253 253   BMET  Recent Labs  11/07/14 2050 11/08/14 0539  NA 141 141  K 4.0 3.9  CL 103 102  CO2 29 30  GLUCOSE 224* 155*  BUN 20 28*  CREATININE 0.92 1.10  CALCIUM 8.7* 8.7*    Recent Labs  11/07/14 0400 11/07/14 1030  TROPONINI 0.31* 0.29*    Lab Results  Component Value Date   CHOL 172  08/16/2013   HDL 31.80* 08/16/2013   LDLCALC 125* 08/16/2013   TRIG 77.0 08/16/2013   CHOLHDL 5 08/16/2013   Lab Results  Component Value Date   HGBA1C 6.2 06/20/2013     Lab Results  Component Value Date   TSH 1.12 05/24/2014    Hepatic Function Panel  Recent Labs  11/06/14 1526  PROT 6.7  ALBUMIN 3.5  AST 27  ALT 37  ALKPHOS 80  BILITOT 1.3*   No results for input(s): CHOL in the last 72 hours. No results for input(s): PROTIME in the last 72 hours.     Studies/Results: Dg Chest 2 View  11/06/2014  CLINICAL DATA:  Short of breath EXAM: CHEST  2 VIEW COMPARISON:  10/08/2014 FINDINGS: Chronic interstitial fibrosis is noted. Progression of bilateral airspace disease compared with prior studies suggestive of superimposed acute process such as edema or bilateral pneumonia. No significant pleural effusion. Cardiac enlargement. IMPRESSION: Chronic interstitial fibrosis. Superimposed acute airspace disease bilaterally may be due to edema or pneumonia bilaterally. Electronically Signed   By: Marlan Palau M.D.   On: 11/06/2014 14:28   Ct Angio Chest Pe W/cm &/or Wo Cm  11/06/2014  CLINICAL DATA:  Dyspnea.  Hypoxia.  COPD.  Hemoptysis. EXAM: CT ANGIOGRAPHY CHEST WITH CONTRAST TECHNIQUE: Multidetector CT imaging of the chest was performed using the standard protocol during bolus administration of intravenous contrast. Multiplanar  CT image reconstructions and MIPs were obtained to evaluate the vascular anatomy. CONTRAST:  OMNIPAQUE IOHEXOL 350 MG/ML SOLN COMPARISON:  Multiple exams, including 11/06/2014 hand 08/21/2013. FINDINGS: Mediastinum/Nodes: No filling defect is identified in the pulmonary arterial tree to suggest pulmonary embolus. Coronary, aortic arch, and branch vessel atherosclerotic vascular disease. Moderate cardiomegaly. Interventricular septum thickness 2.1 cm compatible with left ventricular hypertrophy. Scattered mild mediastinal adenopathy. Index prevascular node  1.2 cm, image 37 series 4. Index right lower paratracheal node 1.7 cm, image 33 series 4. Lungs/Pleura: Chronic peripheral coarse interstitial accentuation compatible with fibrosis. Superimposed ground-glass and airspace opacities with secondary pulmonary lobular interstitial accentuation and scattered ground-glass opacities, favoring acute pulmonary edema and less likely to be due to atypical infectious process. Small bilateral pleural effusions. Upper abdomen: There is some reflux of contrast medium into the hepatic veins. Several small hypodense lesions in the lateral segment left hepatic lobe,, similar to prior exam. Musculoskeletal: 6 mm of anterolisthesis at C7-T1, not changed from 08/31/2013. 5-10% superior endplate compression fracture at the T5 vertebral level, image 93 series 10, new compared to the CT scan from 08/31/2013. A all Review of the MIP images confirms the above findings. IMPRESSION: 1. No filling defect is identified in the pulmonary arterial tree to suggest pulmonary embolus. 2. Moderate cardiomegaly with acute pulmonary edema superimposed on chronic peripheral fibrosis/usual interstitial pneumonia. 3. Left ventricular hypertrophy. 4. Scattered mild mediastinal adenopathy, likely due to passive congestion. 5. 5-10% superior endplate compression fracture at the T5 vertebral level is new compared to 08/31/13 and probably subacute. 6. Small bilateral pleural effusions. 7. Several small hypodense lesions in the lateral segment left hepatic lobe, similar to prior CT chest. Electronically Signed   By: Gaylyn Rong M.D.   On: 11/06/2014 17:20   Portable Chest Xray  11/07/2014  CLINICAL DATA:  Endotracheal tube placement, central line placement EXAM: PORTABLE CHEST 1 VIEW COMPARISON:  11/06/2014 FINDINGS: Borderline cardiomegaly. NG tube in place. Endotracheal tube in place with tip 3.9 cm above the carina. No pneumothorax. Right IJ central line with tip in SVC right atrium junction. Again  noted patchy interstitial prominence bilateral probable pulmonary edema superimposed on chronic interstitial lung disease. IMPRESSION: NG tube in place. Endotracheal tube in place. Right IJ central line with tip in SVC right atrium junction. No pneumothorax. Again noted patchy bilateral interstitial prominence. Electronically Signed   By: Natasha Mead M.D.   On: 11/07/2014 18:05   Dg Abd Portable 1v  11/07/2014  CLINICAL DATA:  OG tube placement. EXAM: PORTABLE ABDOMEN - 1 VIEW COMPARISON:  None. FINDINGS: Nasogastric terminates in the stomach. Bowel gas pattern is unremarkable. Incidental imaging of the lower chest shows coarsening of the pulmonary markings. IMPRESSION: Nasogastric tube terminates in the stomach. Electronically Signed   By: Leanna Battles M.D.   On: 11/07/2014 18:03   ECHO: Study Conclusions  - Left ventricle: The cavity size was normal. There was severe concentric hypertrophy. Systolic function was mildly reduced. The estimated ejection fraction was in the range of 45% to 50%. Diffuse hypokinesis with no identifiable regional variations. Regional wall motion abnormalities cannot be excluded. - Mitral valve: There was mild to moderate regurgitation. - Left atrium: The atrium was mildly dilated. - Right ventricle: The cavity size was moderately dilated. Wall thickness was normal. - Pulmonary arteries: Systolic pressure was moderately increased. PA peak pressure: 50 mm Hg (S).  Medications: I have reviewed the patient's current medications. Scheduled Meds: . antiseptic oral rinse  7 mL Mouth Rinse  QID  . busPIRone  7.5 mg Oral TID  . cefTAZidime (FORTAZ)  IV  1 g Intravenous Q8H  . chlorhexidine gluconate  15 mL Mouth Rinse BID  . digoxin  0.125 mg Oral Daily  . diltiazem  10 mg Intravenous Once  . famotidine (PEPCID) IV  20 mg Intravenous Q12H  . gabapentin  300 mg Oral TID  . LORazepam  0.5 mg Intravenous BID  . methylPREDNISolone (SOLU-MEDROL) injection   40 mg Intravenous Daily  . pantoprazole (PROTONIX) IV  40 mg Intravenous Q24H  . QUEtiapine  25 mg Oral BID  . rivaroxaban  20 mg Oral Daily  . sertraline  50 mg Oral Daily  . sodium chloride  3 mL Intravenous Q12H  . vancomycin  1,000 mg Intravenous Q12H   Continuous Infusions: . diltiazem (CARDIZEM) infusion Stopped (11/07/14 2358)  . fentaNYL infusion INTRAVENOUS 100 mcg/hr (11/08/14 0233)   PRN Meds:.sodium chloride, acetaminophen, fentaNYL, hydrALAZINE, levalbuterol, LORazepam, ondansetron (ZOFRAN) IV, sodium chloride  Assessment/Plan: Principal Problem:   Acute on chronic systolic CHF (congestive heart failure) (HCC) Active Problems:   Essential hypertension   Atrial flutter (HCC)   IPF (idiopathic pulmonary fibrosis) (HCC)   Acute respiratory failure with hypoxia (HCC)   Acute CHF (congestive heart failure) (HCC)   Respiratory failure (HCC)   Acute congestive heart failure (HCC)   Acute respiratory failure with hypoxemia (HCC)   Chronic anticoagulation   Elevated troponin  1. + Troponin:flat trend 0.33->0.34-->0.31. Likely due to strain . Symptoms are not c/w ACS. He is a poor candidate for interventional procedures. No plans for stress testing at this point.   2. Atrial fib: rate in the 90s-low 100s. Continue Digoxin, and Xarelto. He is off Cardizem and rate controlled  3. Pulmonary fibrosis: Suspect this is the primary cause of his hypoxemia and pulmonary HTN. CCM following   4. Moderate pulmonary Hypertension: Continue lasix for now. There has been issues with compliance with salt restriction in the past.   5. Chronic systolic CHF: Has an EF of 45% as of echo in Feb. 2016. Repeat echo with EF 45-50% with mild MR. Continue lasix for edema. currently on hold Doubt he would tolerate beta blocker very well. Recommend starting low dose Losartan, once more stable.  Neg. 4 L since admit.   6. HTN: was  poorly controlled in the 180s systolic. Now labile from  83/54 to 126/62  7. NSVT: patient noted to have several runs of NSVT in the 150s lasting 22-33 beats.  Another episode of SVT.  K+3.9 and Mg+. 2.2  HR currently in the 90s-low 100s. He is a poor candidate for BB therapy given his pulmonary fibrosis. Continue to monitor on telemetry. MD to assess and will provide further recommendations. EF 45-50%.  Concern for pulmonary toxicity with amiodarone.    8. Acute resp failure on vent and sedated. CCM followin   LOS: 2 days   Time spent with pt. : 15 minutes. [facility]  Nurse Practitioner Certified Pager [phone] or after 5pm and on weekends call [phone] 11/08/2014, 8:13 AM   Attending Note:   The patient was seen and examined.  Agree with assessment and plan as noted above.  Changes made to the above note as needed.  Pt has had worsening respiratory distress - was intubated last night.  Had a run of rapid WCT. I think it was atrial fib with aberrant conduction.  Doubt it was atrial fib.   Continue diuresis.  Will continue to follow  Vesta Mixer, Montez Hageman., MD, [facility] 11/08/2014, 4:04 PM [address],  [address] Office [phone] Pager [phone]

## 2014-11-08 NOTE — Progress Notes (Signed)
PULMONARY / CRITICAL CARE MEDICINE   Name: [name] MRN: [id number] DOB: 1931/02/07    ADMISSION DATE:  11/06/2014 CONSULTATION DATE:  11/07/2014   REFERRING MD :  Roda Shutters  CHIEF COMPLAINT:  Shortness of breath  INITIAL PRESENTATION: 81 year old male with a past medical history significant for pulmonary fibrosis was admitted on 11/07/2014 with acute hypoxemic respiratory failure in the setting of increasing leg swelling. He has known ischemic cardiomyopathy.   STUDIES:  11/06/2014 CT angiogram chest no pulmonary embolism, there is diffuse bilateral airspace disease with small pleural effusions consistent with pulmonary edema on top of a background of significant pulmonary fibrosis. 11/07/2014 echocardiogram> LVEF 45-50%, mild left atrium dilation, RV moderately dilated, PAS 63mm Hg  SIGNIFICANT EVENTS: 10/19 worsening hypoxemia, required intubation   SUBJECTIVE: intubated yesterday for worsening hypoxemic respiratory failure Cardizem held for bradycardia yesterday  VITAL SIGNS: Temp:  [95.4 F (35.2 C)-97.6 F (36.4 C)] 95.4 F (35.2 C) (10/20 0800) Pulse Rate:  [38-157] 68 (10/20 1000) Resp:  [13-34] 20 (10/20 1000) BP: (83-211)/(38-153) 109/61 mmHg (10/20 1000) SpO2:  [80 %-99 %] 96 % (10/20 1000) FiO2 (%):  [40 %-100 %] 60 % (10/20 1000) Weight:  [89 kg (196 lb 3.4 oz)] 89 kg (196 lb 3.4 oz) (10/20 0503) HEMODYNAMICS:   VENTILATOR SETTINGS: Vent Mode:  [-] PRVC FiO2 (%):  [40 %-100 %] 60 % Set Rate:  [15 bmp-18 bmp] 18 bmp Vt Set:  [550 mL-5250 mL] 550 mL PEEP:  [8 cmH20] 8 cmH20 Plateau Pressure:  [24 cmH20-35 cmH20] 25 cmH20 INTAKE / OUTPUT:  Intake/Output Summary (Last 24 hours) at 11/08/14 1138 Last data filed at 11/08/14 1000  Gross per 24 hour  Intake 1663.34 ml  Output    700 ml  Net 963.34 ml    PHYSICAL EXAMINATION: General:  Elderly male chronically ill, on vent Neuro:  Sedated on vent HEENT:  Normocephalic atraumatic ETT in  place Cardiovascular:  Irregularly irregular, no clear murmur on my exam, exteremities with edema, well perfused Lungs:  Crackles bilaterally, increased effort, vent Abdomen:  Bowel sounds positive, nontender nondistended Musculoskeletal:  Normal bulk and tone Skin:  Mild skin bruising, significant edema in legs bilaterally  LABS:  CBC  Recent Labs Lab 11/06/14 1526 11/07/14 0400  WBC 13.8* 12.7*  HGB 13.2 13.8  HCT 40.3 41.2  PLT 253 253   Coag's No results for input(s): APTT, INR in the last 168 hours. BMET  Recent Labs Lab 11/07/14 0400 11/07/14 2050 11/08/14 0539  NA 142 141 141  K 3.8 4.0 3.9  CL 103 103 102  CO2 31 29 30   BUN 16 20 28*  CREATININE 0.87 0.92 1.10  GLUCOSE 164* 224* 155*   Electrolytes  Recent Labs Lab 11/07/14 0400 11/07/14 2050 11/08/14 0539  CALCIUM 8.5* 8.7* 8.7*  MG  --  2.2 2.2   Sepsis Markers  Recent Labs Lab 11/06/14 1534  LATICACIDVEN 1.21   ABG  Recent Labs Lab 11/07/14 0942 11/07/14 1416 11/07/14 1700  PHART 7.522* 7.506* 7.348*  PCO2ART 32.0* 32.7* 52.6*  PO2ART 61.0* 67.5* 239*   Liver Enzymes  Recent Labs Lab 11/06/14 1526  AST 27  ALT 37  ALKPHOS 80  BILITOT 1.3*  ALBUMIN 3.5   Cardiac Enzymes  Recent Labs Lab 11/06/14 2216 11/07/14 0400 11/07/14 1030  TROPONINI 0.34* 0.31* 0.29*   Glucose No results for input(s): GLUCAP in the last 168 hours.  Imaging 10/19 CXR > bilateral pulmonary edema, ETT in place, no effusion today,  R IJ CVL   ASSESSMENT / PLAN:  PULMONARY A: Acute respiratory failure with hypoxemia in the setting of acute pulmonary edema with a background of idiopathic pulmonary fibrosis. Required intubation yesterday; doubt IPF flare or HCAP.  We are hoping for a quick response to diuretic medications given his prior episodes of the same.  P:   Continue full vent support with 8cc/kg TVol, 8 PEEP Check ABG now Wean O2/PEEP for O2 sat > 90% Continue diuresis CXR  tomorrow WUA/SBT tomorrow Low dose solumedrol to continue  CARDIOVASCULAR A: Acute decompensated heart failure, systolic A. fib with RVR > improved 10/20 Frequent ectopy on telemetry monitoring P:  Appreciate cardiology Lasix today x2 Afib management per cardiology  RENAL A:  Mild hypokalemia in the setting of diuresis P:   KCL replacement today with lasix Monitor BMET and UOP Replace electrolytes as needed   GASTROINTESTINAL A:  No acute issues P:   Start tube feeding Famotidine for stress ulcer prophylaxis  HEMATOLOGIC A:  No acute issues P:  Monitor for bleeding  INFECTIOUS A:  Possible healthcare associated pneumonia P:   BCx2 October 18 >    11/06/2014 vancomycin>> 11/06/2014 Ceftazidime>>  ENDOCRINE A:  Hyperglycemia P:   SSI  NEUROLOGIC A:  Severe anxiety P:   Increase anxiolytic with Ativan   FAMILY  - Updates: I updated his sister at length yesterday.  We plan for short term intubation only and diuresis for CHF exacerbation.  If his respiratory status has not improved tomorrow will clarify details of withdrawal of care if necessary.  - Inter-disciplinary family meet or Palliative Care meeting due by:   11/13/2014   CODE STATUS: Currently full code. See prior notes  CC time 34 minutes  Heber [place], MD [place] [facility] Pager: [phone] Cell: [phone] After 3pm or if no response, call [phone]  11/08/2014, 11:38 AM

## 2014-11-08 NOTE — Progress Notes (Signed)
eLink Physician-Brief Progress Note Patient Name: [name] DOB: 04-30-1931 MRN: [id number]   Date of Service  11/08/2014  HPI/Events of Note  Reviewed 15 beats of spontaneous rapid A fib vs polymorphic VT. Note electrolytes from 20:50 are normal. He is on diltiazem 5 with HR 50's > would like to wean this to off. Consider amiodarone, but would like to avoid given his prior exposure on a previous hospitalization and his history of UIP pattern on CT chest. Will attempt to wean his FiO2 (currently 0.60) in the event that amio is needed to minimize risk of toxicity.   eICU Interventions       Intervention Category Intermediate Interventions: Arrhythmia - evaluation and management  Bethanne Mule S. 11/08/2014, 12:03 AM

## 2014-11-08 NOTE — Progress Notes (Signed)
Spoke to Dr. Delton Coombes via [place] with repsect to second episode of suspected VT for Pt.  General concern at this time for Amiodarone toxicity and/or hypotension/bradicardia with Cardizem.  No new orders at this time.  Will continue to closely monitor Pt status.

## 2014-11-08 NOTE — Progress Notes (Signed)
Initial Nutrition Assessment  DOCUMENTATION CODES:   Obesity unspecified  INTERVENTION:  - If pt to remain intubated >24 hours, recommend Vital AF 1.2 @ 50 mL/hr with 30 mL Prostat once/day to provide 1540 kcal (92% estimated needs), 105 grams protein, and 973 mL free water. - RD will continue to monitor for needs  NUTRITION DIAGNOSIS:   Inadequate oral intake related to inability to eat as evidenced by NPO status.  GOAL:   Provide needs based on ASPEN/SCCM guidelines  MONITOR:   Vent status, Weight trends, Labs, Skin, I & O's  REASON FOR ASSESSMENT:   Low Braden, Ventilator  ASSESSMENT:   81 y.o. male with a past medical history of systolic congestive heart failure having his last transthoracic echocardiogram performed on 02/21/2014 that showed ejection fraction 40-45%, history of atrial fibrillation/atrial flutter on chronic anticoagulation with Xarelto, pulmonary fibrosis having previous hospitalizations for acute decompensated congestive heart failure presented to the emergency department with complaints of shortness of breath associated with worsening bilateral extremity edema becoming progressively worse over the past several days. He complains of associated chest tightness, orthopnea, having a functional decline requiring greater assistance with activities of daily living. Lab work in the emergency room included a troponin that was elevated at 0.37. EKG showed atrial fibrillation with left anterior fascicular block. He had a CT scan of chest with IV contrast in the emergency department that was negative for pulmonary embolism however radiology reported acute pulmonary edema.   Pt seen for low Braden and new vent. BMI indicates obesity.  Patient is currently intubated on ventilator support with OGT in place. MV: 9.5 L/min Temp (24hrs), Avg:97 F (36.1 C), Min:95.4 F (35.2 C), Max:97.6 F (36.4  C)  Propofol: none  No family or visitors present at time of visit to provide PTA information. Per chart review, pt has lost 11 lbs (5% body weight) in the past 10 months which is not significant for time frame. No muscle or fat wasting noted, mild BLE edema noted.   Not meeting needs at this time. TF recommendations outlined above. Medications reviewed. Labs reviewed; BUN elevated, Ca: 8.7 mg/dL.    Diet Order:  Diet NPO time specified  Skin:  Wound (see comment) (R hip incision (11/06/14))  Last BM:  10/18  Height:   Ht Readings from Last 1 Encounters:  11/06/14 5\' 8"  (1.727 m)    Weight:   Wt Readings from Last 1 Encounters:  11/08/14 196 lb 3.4 oz (89 kg)    Ideal Body Weight:  70 kg (kg)  BMI:  Body mass index is 29.84 kg/(m^2).  Estimated Nutritional Needs:   Kcal:  1673  Protein:  103 grams  Fluid:  1.6-1.8 L/day  EDUCATION NEEDS:   No education needs identified at this time      [id number], RD, LDN Inpatient Clinical Dietitian Pager # [phone] After hours/weekend pager # [phone]

## 2014-11-09 ENCOUNTER — Inpatient Hospital Stay (HOSPITAL_COMMUNITY): Payer: Medicare Other

## 2014-11-09 LAB — CBC WITH DIFFERENTIAL/PLATELET
BASOS ABS: 0 10*3/uL (ref 0.0–0.1)
Basophils Relative: 0 %
EOS PCT: 0 %
Eosinophils Absolute: 0 10*3/uL (ref 0.0–0.7)
HEMATOCRIT: 39 % (ref 39.0–52.0)
Hemoglobin: 12.7 g/dL — ABNORMAL LOW (ref 13.0–17.0)
LYMPHS ABS: 0.6 10*3/uL — AB (ref 0.7–4.0)
LYMPHS PCT: 4 %
MCH: 32.6 pg (ref 26.0–34.0)
MCHC: 32.6 g/dL (ref 30.0–36.0)
MCV: 100.3 fL — AB (ref 78.0–100.0)
Monocytes Absolute: 0.7 10*3/uL (ref 0.1–1.0)
Monocytes Relative: 5 %
NEUTROS ABS: 14 10*3/uL — AB (ref 1.7–7.7)
Neutrophils Relative %: 91 %
PLATELETS: 260 10*3/uL (ref 150–400)
RBC: 3.89 MIL/uL — AB (ref 4.22–5.81)
RDW: 17.1 % — ABNORMAL HIGH (ref 11.5–15.5)
WBC: 15.4 10*3/uL — AB (ref 4.0–10.5)

## 2014-11-09 LAB — BLOOD GAS, ARTERIAL
ACID-BASE EXCESS: 3.3 mmol/L — AB (ref 0.0–2.0)
Bicarbonate: 28.2 mEq/L — ABNORMAL HIGH (ref 20.0–24.0)
DRAWN BY: 441261
FIO2: 0.4
MECHVT: 550 mL
O2 Saturation: 97.2 %
PEEP/CPAP: 8 cmH2O
PO2 ART: 115 mmHg — AB (ref 80.0–100.0)
Patient temperature: 98.6
RATE: 18 resp/min
TCO2: 25.6 mmol/L (ref 0–100)
pCO2 arterial: 46.7 mmHg — ABNORMAL HIGH (ref 35.0–45.0)
pH, Arterial: 7.398 (ref 7.350–7.450)

## 2014-11-09 LAB — TROPONIN I
TROPONIN I: 0.43 ng/mL — AB (ref <0.031)
TROPONIN I: 0.44 ng/mL — AB (ref <0.031)

## 2014-11-09 LAB — BASIC METABOLIC PANEL
Anion gap: 8 (ref 5–15)
BUN: 53 mg/dL — AB (ref 6–20)
CHLORIDE: 103 mmol/L (ref 101–111)
CO2: 30 mmol/L (ref 22–32)
CREATININE: 1.72 mg/dL — AB (ref 0.61–1.24)
Calcium: 8.5 mg/dL — ABNORMAL LOW (ref 8.9–10.3)
GFR calc Af Amer: 41 mL/min — ABNORMAL LOW (ref >60)
GFR calc non Af Amer: 36 mL/min — ABNORMAL LOW (ref >60)
GLUCOSE: 140 mg/dL — AB (ref 65–99)
POTASSIUM: 4.9 mmol/L (ref 3.5–5.1)
Sodium: 141 mmol/L (ref 135–145)

## 2014-11-09 LAB — GLUCOSE, CAPILLARY
GLUCOSE-CAPILLARY: 139 mg/dL — AB (ref 65–99)
GLUCOSE-CAPILLARY: 118 mg/dL — AB (ref 65–99)
GLUCOSE-CAPILLARY: 144 mg/dL — AB (ref 65–99)
GLUCOSE-CAPILLARY: 169 mg/dL — AB (ref 65–99)
Glucose-Capillary: 136 mg/dL — ABNORMAL HIGH (ref 65–99)
Glucose-Capillary: 146 mg/dL — ABNORMAL HIGH (ref 65–99)

## 2014-11-09 MED ORDER — DEXTROSE 5 % IV SOLN
1.0000 g | Freq: Two times a day (BID) | INTRAVENOUS | Status: DC
Start: 2014-11-09 — End: 2014-11-12
  Administered 2014-11-09 – 2014-11-12 (×6): 1 g via INTRAVENOUS
  Filled 2014-11-09 (×7): qty 1

## 2014-11-09 MED ORDER — VANCOMYCIN HCL IN DEXTROSE 1-5 GM/200ML-% IV SOLN: 1000.0000 mg | INTRAVENOUS | Status: DC

## 2014-11-09 MED ORDER — RIVAROXABAN 15 MG PO TABS
15.0000 mg | ORAL_TABLET | Freq: Every day | ORAL | Status: DC
Start: 2014-11-09 — End: 2014-11-15
  Administered 2014-11-09 – 2014-11-14 (×6): 15 mg via ORAL
  Filled 2014-11-09 (×7): qty 1

## 2014-11-09 MED ORDER — FAMOTIDINE IN NACL 20-0.9 MG/50ML-% IV SOLN
20.0000 mg | INTRAVENOUS | Status: DC
  Administered 2014-11-09 – 2014-11-14 (×6): 20 mg via INTRAVENOUS
  Filled 2014-11-09 (×7): qty 50

## 2014-11-09 NOTE — Progress Notes (Signed)
ANTIBIOTIC CONSULT NOTE - follow-up  Pharmacy Consult for ceftazidime  Indication: pneumonia  Allergies  Allergen Reactions  . Codeine     The patient states he has allergy to codeine  . Methotrexate Swelling    REACTION: ONLY HAS ONE KIDNEY, caused swelling in hands and feet    Patient Measurements: weight 89 kg, height 68 inches Height: 5\' 8"  (172.7 cm) Weight: 199 lb 8.3 oz (90.5 kg) IBW/kg (Calculated) : 68.4 Vital Signs: Temp: 98.8 F (37.1 C) (10/21 1100) Temp Source: Core (Comment) (10/21 0600) BP: 126/69 mmHg (10/21 1100) Pulse Rate: 95 (10/21 1100) Intake/Output from previous day: 10/20 0701 - 10/21 0700 In: 1928.5 [I.V.:455; NG/GT:1223.5; IV Piggyback:250] Out: 1515 [Urine:1515] Intake/Output from this shift: Total I/O In: 62.5 [I.V.:12.5; NG/GT:50] Out: 100 [Urine:100]  Labs:  Recent Labs  11/07/14 0400 11/07/14 2050 11/08/14 0539 11/08/14 1153 11/09/14 0430  WBC 12.7*  --   --  17.0* 15.4*  HGB 13.8  --   --  12.9* 12.7*  PLT 253  --   --  216 260  CREATININE 0.87 0.92 1.10  --  1.72*   Estimated Creatinine Clearance: 36.8 mL/min (by C-G formula based on Cr of 1.72).  Recent Labs  11/08/14 1658  VANCOTROUGH 28*     Microbiology: Recent Results (from the past 720 hour(s))  Blood culture (routine x 2)     Status: None (Preliminary result)   Collection Time: 11/06/14  6:20 PM  Result Value Ref Range Status   Specimen Description BLOOD RIGHT FOREARM  Final   Special Requests BOTTLES DRAWN AEROBIC AND ANAEROBIC 11/08/14  Final   Culture   Final    NO GROWTH 2 DAYS Performed at [facility]    Report Status PENDING  Incomplete  Blood culture (routine x 2)     Status: None (Preliminary result)   Collection Time: 11/06/14  6:53 PM  Result Value Ref Range Status   Specimen Description BLOOD LEFT FOREARM  Final   Special Requests BOTTLES DRAWN AEROBIC AND ANAEROBIC 11/08/14  Final   Culture   Final    NO GROWTH 2 DAYS Performed at [facility]    Report Status PENDING  Incomplete  MRSA PCR Screening     Status: None   Collection Time: 11/06/14  8:57 PM  Result Value Ref Range Status   MRSA by PCR NEGATIVE NEGATIVE Final    Comment:        The GeneXpert MRSA Assay (FDA approved for NASAL specimens only), is one component of a comprehensive MRSA colonization surveillance program. It is not intended to diagnose MRSA infection nor to guide or monitor treatment for MRSA infections.      Assessment: Patient's an 81 y.o F who presented to the ED from [facility] with c/o SOB.  CXR is suspicious for PNA.  To start vancomycin and ceftazidime for r/o PNA. Vancomycin stopped 10/21  10/18 vanc>> 10/21 10/18 ceftaz>>  10/18 Blood: NGTD  Renal: AKI with rise in SCr this am (UOP OK) WBC improving afebrile  Goal of Therapy:  Vancomycin trough level 15-20 mcg/ml  Plan:  - adjust ceftazidime to 1gm IV q12h  11/18, PharmD, BCPS.   Pager: Juliette Alcide  11/09/2014,11:11 AM

## 2014-11-09 NOTE — Progress Notes (Signed)
Patient Profile: Patient is a 81 y.o. male with a PMHx of atrial fib, pulmonary fibrosis, Chronic diastolic CHF, HTN, OSA, , who was admitted to [facility] on 11/06/2014 for evaluation of worsening dyspnea. He was found to have a positive Troponin and we were asked to see. Also with runs of NSVT on telemetry.   11/07/14 increased Resp distress and intubated 10/19   Subjective: Shook his head yes to chest pain.  No pain with palpation.    Objective: Vital signs in last 24 hours: Temp:  [97.2 F (36.2 C)-99.5 F (37.5 C)] 99.1 F (37.3 C) (10/21 0600) Pulse Rate:  [51-109] 86 (10/21 0600) Resp:  [12-31] 12 (10/21 0600) BP: (96-157)/(52-108) 150/74 mmHg (10/21 0600) SpO2:  [87 %-100 %] 100 % (10/21 0600) FiO2 (%):  [50 %-60 %] 50 % (10/21 0600) Weight:  [199 lb 8.3 oz (90.5 kg)] 199 lb 8.3 oz (90.5 kg) (10/21 0439) Weight change: 3 lb 4.9 oz (1.5 kg) Last BM Date: 11/06/14 Intake/Output from previous day: +361  Wt up 3 lbs to 199 from 196 10/20 0701 - 10/21 0700 In: 1866.5 [I.V.:443; NG/GT:1173.5; IV Piggyback:250] Out: 1515 [Urine:1515] Intake/Output this shift:    PE: General:Pleasant affect, sedated but wakes when name called. On vent Skin:Warm and dry, brisk capillary refill HEENT:normocephalic, sclera clear, mucus membranes moist Heart:irreg irreg without murmur, gallup, rub or click Lungs:clear, ant- without rales, rhonchi, or wheezes NAT:FTDD, non tender, + BS, do not palpate liver spleen or masses UKG:URKYHCWC lower ext edema now 1+, 2+ radial pulses Neuro:alert and oriented, MAE, follows commands, + facial symmetry Tele:   A fib rate controlled  Lab Results:  Recent Labs  11/08/14 1153 11/09/14 0430  WBC 17.0* 15.4*  HGB 12.9* 12.7*  HCT 39.3 39.0  PLT 216 260   BMET  Recent Labs  11/08/14 0539 11/09/14 0430  NA 141 141  K 3.9 4.9  CL 102 103  CO2 30 30  GLUCOSE 155* 140*  BUN 28* 53*  CREATININE 1.10 1.72*  CALCIUM 8.7* 8.5*    Recent  Labs  11/07/14 0400 11/07/14 1030  TROPONINI 0.31* 0.29*    Lab Results  Component Value Date   CHOL 172 08/16/2013   HDL 31.80* 08/16/2013   LDLCALC 125* 08/16/2013   TRIG 77.0 08/16/2013   CHOLHDL 5 08/16/2013   Lab Results  Component Value Date   HGBA1C 6.2 06/20/2013     Lab Results  Component Value Date   TSH 1.12 05/24/2014    Hepatic Function Panel  Recent Labs  11/06/14 1526  PROT 6.7  ALBUMIN 3.5  AST 27  ALT 37  ALKPHOS 80  BILITOT 1.3*   No results for input(s): CHOL in the last 72 hours. No results for input(s): PROTIME in the last 72 hours.     Studies/Results: Dg Chest Port 1 View  11/09/2014  CLINICAL DATA:  Respiratory failure EXAM: PORTABLE CHEST 1 VIEW COMPARISON:  11/07/2014 chest radiograph FINDINGS: Endotracheal tube tip is 4.7 cm above the carina. Right internal jugular central venous catheter terminates in the lower third of the superior vena cava. Enteric tube terminates in the proximal stomach Stable cardiomediastinal silhouette with mild cardiomegaly. No pneumothorax. No pleural effusion. There is severe hazy and distorted reticular opacities throughout both lungs, not appreciably changed. IMPRESSION: 1. Well-positioned lines and tubes. 2. Stable severe hazy and reticular opacities throughout both lungs, in keeping with underlying advanced interstitial lung disease, cannot exclude superimposed pulmonary edema given the  mild cardiomegaly and hazy opacities. Electronically Signed   By: Delbert Phenix M.D.   On: 11/09/2014 07:13   Portable Chest Xray  11/07/2014  CLINICAL DATA:  Endotracheal tube placement, central line placement EXAM: PORTABLE CHEST 1 VIEW COMPARISON:  11/06/2014 FINDINGS: Borderline cardiomegaly. NG tube in place. Endotracheal tube in place with tip 3.9 cm above the carina. No pneumothorax. Right IJ central line with tip in SVC right atrium junction. Again noted patchy interstitial prominence bilateral probable pulmonary edema  superimposed on chronic interstitial lung disease. IMPRESSION: NG tube in place. Endotracheal tube in place. Right IJ central line with tip in SVC right atrium junction. No pneumothorax. Again noted patchy bilateral interstitial prominence. Electronically Signed   By: Natasha Mead M.D.   On: 11/07/2014 18:05   Dg Abd Portable 1v  11/07/2014  CLINICAL DATA:  OG tube placement. EXAM: PORTABLE ABDOMEN - 1 VIEW COMPARISON:  None. FINDINGS: Nasogastric terminates in the stomach. Bowel gas pattern is unremarkable. Incidental imaging of the lower chest shows coarsening of the pulmonary markings. IMPRESSION: Nasogastric tube terminates in the stomach. Electronically Signed   By: Leanna Battles M.D.   On: 11/07/2014 18:03    Medications:  I have reviewed medications. Scheduled Meds: . antiseptic oral rinse  7 mL Mouth Rinse QID  . busPIRone  7.5 mg Oral TID  . cefTAZidime (FORTAZ)  IV  1 g Intravenous Q8H  . chlorhexidine gluconate  15 mL Mouth Rinse BID  . digoxin  0.125 mg Oral Daily  . diltiazem  10 mg Intravenous Once  . famotidine (PEPCID) IV  20 mg Intravenous Q12H  . feeding supplement (PRO-STAT SUGAR FREE 64)  30 mL Per Tube BID  . free water  100 mL Per Tube 3 times per day  . gabapentin  300 mg Oral TID  . insulin aspart  0-9 Units Subcutaneous 6 times per day  . LORazepam  0.5 mg Intravenous BID  . methylPREDNISolone (SOLU-MEDROL) injection  40 mg Intravenous Daily  . pantoprazole (PROTONIX) IV  40 mg Intravenous Q24H  . QUEtiapine  25 mg Oral BID  . rivaroxaban  20 mg Oral Daily  . sertraline  50 mg Oral Daily  . sodium chloride  3 mL Intravenous Q12H  . vancomycin  1,000 mg Intravenous Q24H   Continuous Infusions: . diltiazem (CARDIZEM) infusion Stopped (11/07/14 2358)  . feeding supplement (VITAL AF 1.2 CAL) 1,000 mL (11/09/14 0600)  . fentaNYL infusion INTRAVENOUS 25 mcg/hr (11/09/14 0612)   PRN Meds:.sodium chloride, acetaminophen, fentaNYL, hydrALAZINE, levalbuterol,  LORazepam, ondansetron (ZOFRAN) IV, sodium chloride    Assessment/Plan: Principal Problem:   Acute on chronic systolic CHF (congestive heart failure) (HCC) Active Problems:   Essential hypertension   Atrial flutter (HCC)   IPF (idiopathic pulmonary fibrosis) (HCC)   Acute respiratory failure with hypoxia (HCC)   Acute CHF (congestive heart failure) (HCC)   Respiratory failure (HCC)   Acute congestive heart failure (HCC)   Acute respiratory failure with hypoxemia (HCC)   Chronic anticoagulation   Elevated troponin  1. + Troponin:flat trend 0.33->0.34-->0.31. Likely due to strain . Symptoms are not c/w ACS. He is a poor candidate for interventional procedures. No plans for stress testing at this point.  Chest pain if he truly understood question will check EKG and recheck troponin.   2. Atrial fib: rate has been in the 90s-low 100s. This AM with increased rate 130-150 Continue Digoxin, and Xarelto. He is off Cardizem but may need to restart for increased rate.  If BP allows.   Will need to check dig level in AM..   3. Pulmonary fibrosis: Suspect this is the primary cause of his hypoxemia and pulmonary HTN. CCM following   4. Moderate pulmonary Hypertension: Continue lasix for now. There has been issues with compliance with salt restriction in the past.   5. Chronic systolic CHF: Has an EF of 45% as of echo in Feb. 2016. Repeat echo with EF 45-50% with mild MR. Continue lasix for edema. May need to go back to 60 mg IV the 40 mg with + I&O yesterday.   Doubt he would tolerate beta blocker very well. Recommend starting low dose Losartan, once more stable. now  Neg. 3792 since admit.   His lower ext edema is improving  6. HTN: was poorly controlled in the 180s systolic. Now with more stable BP 119-150 systolic.    7. NSVT: patient noted to have several runs of NSVT possible a fib with aberrancy  in the 150s lasting 22-33 beats. Another episode of SVT. K+4.9 and Mg+. 2.2  HR currently in the 90s-low 100s. He is a poor candidate for BB therapy given his pulmonary fibrosis. Continue to monitor on telemetry. MD to assess and will provide further recommendations. EF 45-50%. Concern for pulmonary toxicity with amiodarone.   8. Acute resp failure on vent and sedated. CCM following  9.  AKI, cr up to 1.72 today K+ 4.9 monitor   LOS: 3 days   Time spent with pt. :15 minutes. [facility] R  Nurse Practitioner Certified Pager [phone] or after 5pm and on weekends call [phone] 11/09/2014, 8:13 AM   Attending Note:   The patient was seen and examined.  Agree with assessment and plan as noted above.  Changes made to the above note as needed.   Still having volume issues.  I suspect this is primarily right heart failure from his pulmonary HTN and pulmonary fibrosis.   Vesta Mixer, Montez Hageman., MD, [facility] 11/09/2014, 12:02 PM [address],  [address] Office [phone] Pager [phone]

## 2014-11-09 NOTE — Progress Notes (Signed)
PULMONARY / CRITICAL CARE MEDICINE   Name: [name] MRN: [id number] DOB: 1931-11-23    ADMISSION DATE:  11/06/2014 CONSULTATION DATE:  11/07/2014   REFERRING MD :  Roda Shutters  CHIEF COMPLAINT:  Shortness of breath  INITIAL PRESENTATION: 81 year old male with a past medical history significant for pulmonary fibrosis was admitted on 11/07/2014 with acute hypoxemic respiratory failure in the setting of increasing leg swelling. He has known ischemic cardiomyopathy.   STUDIES:  11/06/2014 CT angiogram chest no pulmonary embolism, there is diffuse bilateral airspace disease with small pleural effusions consistent with pulmonary edema on top of a background of significant pulmonary fibrosis. 11/07/2014 echocardiogram> LVEF 45-50%, mild left atrium dilation, RV moderately dilated, PAS 76mm Hg  SIGNIFICANT EVENTS: 10/19 worsening hypoxemia, required intubation   SUBJECTIVE:  Pt reported chest pain to Cardiology NP.  Denies SOB, visiting with friends upon entering the room.   VITAL SIGNS: Temp:  [97.2 F (36.2 C)-99.5 F (37.5 C)] 99.1 F (37.3 C) (10/21 0800) Pulse Rate:  [49-111] 109 (10/21 0832) Resp:  [12-31] 22 (10/21 0832) BP: (96-157)/(52-108) 124/86 mmHg (10/21 0832) SpO2:  [87 %-100 %] 99 % (10/21 0832) FiO2 (%):  [40 %-60 %] 40 % (10/21 0950) Weight:  [199 lb 8.3 oz (90.5 kg)] 199 lb 8.3 oz (90.5 kg) (10/21 0439)   HEMODYNAMICS:     VENTILATOR SETTINGS: Vent Mode:  [-] PRVC FiO2 (%):  [40 %-60 %] 40 % Set Rate:  [18 bmp] 18 bmp Vt Set:  [550 mL] 550 mL PEEP:  [8 cmH20-12 cmH20] 8 cmH20 Plateau Pressure:  [19 cmH20-33 cmH20] 32 cmH20   INTAKE / OUTPUT:  Intake/Output Summary (Last 24 hours) at 11/09/14 1032 Last data filed at 11/09/14 0800  Gross per 24 hour  Intake 1721.01 ml  Output   1615 ml  Net 106.01 ml    PHYSICAL EXAMINATION: General:  Elderly male chronically ill, on vent Neuro:  Sedated on vent HEENT:  Normocephalic atraumatic ETT in  place Cardiovascular:  Irregularly irregular, no murmur, exteremities with edema, well perfused Lungs:  Clear anterior, crackles lower/lateral, non-labored Abdomen:  Bowel sounds positive, nontender nondistended Musculoskeletal:  Normal bulk and tone Skin:  Mild skin bruising, significant edema in legs bilaterally  LABS:  CBC  Recent Labs Lab 11/07/14 0400 11/08/14 1153 11/09/14 0430  WBC 12.7* 17.0* 15.4*  HGB 13.8 12.9* 12.7*  HCT 41.2 39.3 39.0  PLT 253 216 260   Coag's No results for input(s): APTT, INR in the last 168 hours.   BMET  Recent Labs Lab 11/07/14 2050 11/08/14 0539 11/09/14 0430  NA 141 141 141  K 4.0 3.9 4.9  CL 103 102 103  CO2 29 30 30   BUN 20 28* 53*  CREATININE 0.92 1.10 1.72*  GLUCOSE 224* 155* 140*   Electrolytes  Recent Labs Lab 11/07/14 2050 11/08/14 0539 11/09/14 0430  CALCIUM 8.7* 8.7* 8.5*  MG 2.2 2.2  --    Sepsis Markers  Recent Labs Lab 11/06/14 1534  LATICACIDVEN 1.21   ABG  Recent Labs Lab 11/07/14 1416 11/07/14 1700 11/08/14 1158  PHART 7.506* 7.348* 7.429  PCO2ART 32.7* 52.6* 41.4  PO2ART 67.5* 239* 49.8*   Liver Enzymes  Recent Labs Lab 11/06/14 1526  AST 27  ALT 37  ALKPHOS 80  BILITOT 1.3*  ALBUMIN 3.5   Cardiac Enzymes  Recent Labs Lab 11/06/14 2216 11/07/14 0400 11/07/14 1030  TROPONINI 0.34* 0.31* 0.29*   Glucose  Recent Labs Lab 11/08/14 1308 11/08/14 1640 11/08/14  1948 11/08/14 2331 11/09/14 0428 11/09/14 0817  GLUCAP 136* 154* 137* 136* 139* 118*    Imaging 10/21 CXR > bilateral opacities throughout the lungs, fibrotic changes, ? Mild superimposed edema   ASSESSMENT / PLAN:  PULMONARY A:  Acute respiratory failure with hypoxemia in the setting of acute pulmonary edema with a background of idiopathic pulmonary fibrosis. Required intubation yesterday; doubt IPF flare or HCAP.  Hoping for a quick response to diuretic medications given his prior episodes of the  same.  P:   Continue full vent support with 8cc/kg TVol, 8 PEEP Check ABG in one hour (after peep reduction) Wean O2/PEEP for O2 sat > 90% Intermittent CXR  Daily WUA/SBT  Low dose solumedrol to continue Hold diuresis 10/21  CARDIOVASCULAR A:  Chest Pain - reported 10/21 Acute decompensated heart failure, systolic A. fib with RVR > improved 10/20 Frequent ectopy on telemetry monitoring P:  Assess troponin with chest pain, EKG Appreciate cardiology Afib management per cardiology  Continue digoxin Xarelto adjusted for renal clearance, appreciate pharmacy input  RENAL A:   Mild hypokalemia in the setting of diuresis AKI  P:   Monitor BMET and UOP Replace electrolytes as needed  GASTROINTESTINAL A:   No acute issues P:   TF per nutrition  Protonix + Famotidine for stress ulcer prophylaxis, dose adjusted (home meds) NPO   HEMATOLOGIC A:   No acute issues P:  Monitor for bleeding  INFECTIOUS A:   Possible healthcare associated pneumonia P:   BCx2 10/18 >>   10/18 vancomycin >> 10/21 10/18 Ceftazidime >>   Narrow abx >>WBC stable on steroids, tmax 99 Monitor fever curve / leukocytosis   ENDOCRINE A:   Hyperglycemia P:   SSI Q4  NEUROLOGIC A:   Severe anxiety P:   Ativan 0.5 mg BID + 0.5-1mg  Q4 PRN Continue zoloft, seroquel   FAMILY  - Updates: No family available 10/21.  Prior plan for short term intubation only and diuresis for CHF exacerbation.  If his respiratory status does not improve, we will need to clarify goals of care.  - Inter-disciplinary family meet or Palliative Care meeting due by:   11/13/2014   CODE STATUS: Currently full code. See prior notes     Canary Brim, NP-C [facility] Pgr: [phone] or if no answer [phone] 11/09/2014, 10:32 AM    Attending:  I have seen and examined the patient with nurse practitioner/resident and agree with the note above.   Oxygenation has improved today No acute  events overnight  Only about 300cc out more than in yesterday  On exam: crackles bilaterally, non-labored breaths with vent support  CXR > persistent bilateral airspace disease, ETT in place, unchanged  BMET noted  Acute respiratory failure with hypoxemia> due primarily to pulmonary edema, improved despite baseline IPF; plan to diurese carefully again today then extubate to high flow oxygen in the next 1-2 days.  Given his baseline incurable (but still somewhat mild) IPF, would not prolong his mechanical ventilator course.  I explained that to his sister who struggles with this decision but agrees. AKI> monitor carefully, given fluid overload will diurse again today. He is NOT a dialysis candidate given his IPF, cardiac disease, and baseline imobility. Anxiety> suspect he will have a lot of this when we lighten sedation and extubate   The goal here is to try to extubate to high flow and hopefully get him back to his nursing facility next week.  If he does not do well  in the next 2 days then the best approach would be to provide comfort measures only.    I communicated his condition with his sister who will try to communicate this to his estranged children.  My CC time is 35 minutes  Heber Fountain, MD [facility] Pager: [phone] Cell: [phone] After 3pm or if no response, call [phone]

## 2014-11-09 NOTE — Clinical Social Work Note (Signed)
Clinical Social Work Assessment  Patient Details  Name: [name] MRN: [id number] Date of Birth: [birth year]  Date of referral:  11/09/14               Reason for consult:  Facility Placement, Discharge Planning                Permission sought to share information with:  Oceanographer granted to share information::  Yes, Verbal Permission Granted  Name::        Agency::     Relationship::     Contact Information:     Housing/Transportation Living arrangements for the past 2 months:  Assisted Living Facility Source of Information:  Facility, Other (Comment Required) (Sister) Patient Interpreter Needed:  None Criminal Activity/Legal Involvement Pertinent to Current Situation/Hospitalization:  No - Comment as needed Significant Relationships:  Adult Children Lives with:  Siblings Do you feel safe going back to the place where you live?   (Pt is intubated / unable to assess.) Need for family participation in patient care:  Yes (Comment)  Care giving concerns:  D/C needs are unclear at this time.   Social Worker assessment / plan:  Pt hospitalized on 11/06/14, from [facility]F, with SOB. Pt is on vent. CSW spoke with pt's sister to provide support and assist with d/c planning. Pt's sister gave CSW permission to contact ALF and provide clinical updates. Pt's sister hopes pt will be able to return to ALF at d/c. CSW has contacted Shanda Bumps at ALF. Clinicals requested to be sent once pt is no longer on vent. CSW will continue to follow to assist with d/c planning needs.  Employment status:  Retired Database administrator PT Recommendations:  Not assessed at this time Information / Referral to community resources:   (Info not required at this time.)  Patient/Family's Response to care:  D/C plan is undetermined at this time.  Patient/Family's Understanding of and Emotional Response to Diagnosis, Current Treatment, and Prognosis:   Pt's sister has spoken with MD when pt was admitted. Pt's sister has provided updates to pt's children, which live out of this area. Sister is concerned regarding d/c plans if pt's breathing does not improve. CSW has provided reassurance that assistance will be provided with d/c planning to a higher level of care, if required.  Emotional Assessment Appearance:  Appears stated age Attitude/Demeanor/Rapport:  Unable to Assess Affect (typically observed):  Unable to Assess Orientation:    Alcohol / Substance use:  Not Applicable Psych involvement (Current and /or in the community):  No (Comment)  Discharge Needs  Concerns to be addressed:  Discharge Planning Concerns Readmission within the last 30 days:  No Current discharge risk:  None Barriers to Discharge:  No Barriers Identified   Royetta Asal, LCSW  [phone] 11/09/2014, 11:43 AM

## 2014-11-09 NOTE — Progress Notes (Signed)
CSW consulted to assist with d/c planning. Pt is from [facility]. Pt presently requires vent. CSW will follow to assist with d/c planning needs.  Cori Razor LCSW [phone]

## 2014-11-10 DIAGNOSIS — R7989 Other specified abnormal findings of blood chemistry: Secondary | ICD-10-CM

## 2014-11-10 DIAGNOSIS — I482 Chronic atrial fibrillation: Secondary | ICD-10-CM

## 2014-11-10 LAB — BASIC METABOLIC PANEL
Anion gap: 6 (ref 5–15)
BUN: 72 mg/dL — AB (ref 6–20)
CALCIUM: 8.5 mg/dL — AB (ref 8.9–10.3)
CHLORIDE: 106 mmol/L (ref 101–111)
CO2: 32 mmol/L (ref 22–32)
Creatinine, Ser: 1.7 mg/dL — ABNORMAL HIGH (ref 0.61–1.24)
GFR calc Af Amer: 42 mL/min — ABNORMAL LOW (ref >60)
GFR, EST NON AFRICAN AMERICAN: 36 mL/min — AB (ref >60)
GLUCOSE: 126 mg/dL — AB (ref 65–99)
POTASSIUM: 4.3 mmol/L (ref 3.5–5.1)
SODIUM: 144 mmol/L (ref 135–145)

## 2014-11-10 LAB — CBC
HEMATOCRIT: 37.5 % — AB (ref 39.0–52.0)
Hemoglobin: 11.9 g/dL — ABNORMAL LOW (ref 13.0–17.0)
MCH: 32.3 pg (ref 26.0–34.0)
MCHC: 31.7 g/dL (ref 30.0–36.0)
MCV: 101.9 fL — ABNORMAL HIGH (ref 78.0–100.0)
Platelets: 220 10*3/uL (ref 150–400)
RBC: 3.68 MIL/uL — ABNORMAL LOW (ref 4.22–5.81)
RDW: 17.2 % — AB (ref 11.5–15.5)
WBC: 11.7 10*3/uL — ABNORMAL HIGH (ref 4.0–10.5)

## 2014-11-10 LAB — GLUCOSE, CAPILLARY
GLUCOSE-CAPILLARY: 178 mg/dL — AB (ref 65–99)
GLUCOSE-CAPILLARY: 164 mg/dL — AB (ref 65–99)
Glucose-Capillary: 151 mg/dL — ABNORMAL HIGH (ref 65–99)
Glucose-Capillary: 120 mg/dL — ABNORMAL HIGH (ref 65–99)
Glucose-Capillary: 138 mg/dL — ABNORMAL HIGH (ref 65–99)
Glucose-Capillary: 137 mg/dL — ABNORMAL HIGH (ref 65–99)

## 2014-11-10 MED ORDER — DILTIAZEM HCL 60 MG PO TABS
60.0000 mg | ORAL_TABLET | Freq: Three times a day (TID) | ORAL | Status: DC
  Administered 2014-11-10 – 2014-11-14 (×11): 60 mg via ORAL
  Filled 2014-11-10 (×17): qty 1

## 2014-11-10 NOTE — Progress Notes (Signed)
Patient Profile: Patient is a 82 y.o. male with a PMHx of atrial fib, pulmonary fibrosis, Chronic diastolic CHF, HTN, OSA, , who was admitted to [facility] on 11/06/2014 for evaluation of worsening dyspnea. He was found to have a positive Troponin and we were asked to see. Also with runs of NSVT on telemetry.   11/07/14 increased Resp distress and intubated 10/19   Subjective: Shook his head yes to chest pain.  No pain with palpation.    Objective: Vital signs in last 24 hours: Temp:  [97.9 F (36.6 C)-99.3 F (37.4 C)] 98.4 F (36.9 C) (10/22 0800) Pulse Rate:  [38-107] 92 (10/22 0800) Resp:  [0-21] 15 (10/22 0800) BP: (95-158)/(50-101) 139/101 mmHg (10/22 0800) SpO2:  [97 %-100 %] 99 % (10/22 0800) FiO2 (%):  [30 %-40 %] 40 % (10/22 0819) Weight:  [197 lb 12 oz (89.7 kg)] 197 lb 12 oz (89.7 kg) (10/22 0500) Weight change: -1 lb 12.2 oz (-0.8 kg) Last BM Date: 11/06/14 Intake/Output from previous day: +361  Wt up 3 lbs to 199 from 196 10/21 0701 - 10/22 0700 In: 2254.2 [I.V.:379.2; NG/GT:1650; IV Piggyback:150] Out: 2260 [Urine:2260] Intake/Output this shift: Total I/O In: 92.5 [I.V.:12.5; NG/GT:80] Out: 100 [Urine:100]  PE: General:Pleasant affect, sedated but wakes when name called. On vent Skin:Warm and dry, brisk capillary refill HEENT:normocephalic, sclera clear, mucus membranes moist Heart:irreg irreg without murmur, gallup, rub or click Lungs:clear, ant- without rales, rhonchi, or wheezes UJW:JXBJ, non tender, + BS, do not palpate liver spleen or masses YNW:GNFAOZHY lower ext edema now 1+, 2+ radial pulses Neuro:alert and oriented, MAE, follows commands, + facial symmetry  Tele:   A fib rate is a bit tachycardic this am    Lab Results:  Recent Labs  11/09/14 0430 11/10/14 0500  WBC 15.4* 11.7*  HGB 12.7* 11.9*  HCT 39.0 37.5*  PLT 260 220   BMET  Recent Labs  11/09/14 0430 11/10/14 0500  NA 141 144  K 4.9 4.3  CL 103 106  CO2 30 32    GLUCOSE 140* 126*  BUN 53* 72*  CREATININE 1.72* 1.70*  CALCIUM 8.5* 8.5*    Recent Labs  11/09/14 0930 11/09/14 1525  TROPONINI 0.43* 0.44*    Lab Results  Component Value Date   CHOL 172 08/16/2013   HDL 31.80* 08/16/2013   LDLCALC 125* 08/16/2013   TRIG 77.0 08/16/2013   CHOLHDL 5 08/16/2013   Lab Results  Component Value Date   HGBA1C 6.2 06/20/2013     Lab Results  Component Value Date   TSH 1.12 05/24/2014    Hepatic Function Panel No results for input(s): PROT, ALBUMIN, AST, ALT, ALKPHOS, BILITOT, BILIDIR, IBILI in the last 72 hours. No results for input(s): CHOL in the last 72 hours. No results for input(s): PROTIME in the last 72 hours.     Studies/Results: Dg Chest Port 1 View  11/09/2014  CLINICAL DATA:  Followup respiratory distress/ pulmonary edema. EXAM: PORTABLE CHEST 1 VIEW COMPARISON:  11/09/2014 at 0507 hours FINDINGS: The endotracheal to is 4 cm above the carina. The right IJ catheter is stable. The NG tube is stable. Persistent diffuse interstitial and airspace process in the lungs. No definite pleural effusions. IMPRESSION: Stable support apparatus. Persistent diffuse interstitial and airspace process. Electronically Signed   By: Rudie Meyer M.D.   On: 11/09/2014 12:30   Dg Chest Port 1 View  11/09/2014  CLINICAL DATA:  Respiratory failure EXAM: PORTABLE CHEST 1 VIEW  COMPARISON:  11/07/2014 chest radiograph FINDINGS: Endotracheal tube tip is 4.7 cm above the carina. Right internal jugular central venous catheter terminates in the lower third of the superior vena cava. Enteric tube terminates in the proximal stomach Stable cardiomediastinal silhouette with mild cardiomegaly. No pneumothorax. No pleural effusion. There is severe hazy and distorted reticular opacities throughout both lungs, not appreciably changed. IMPRESSION: 1. Well-positioned lines and tubes. 2. Stable severe hazy and reticular opacities throughout both lungs, in keeping with  underlying advanced interstitial lung disease, cannot exclude superimposed pulmonary edema given the mild cardiomegaly and hazy opacities. Electronically Signed   By: Delbert Phenix M.D.   On: 11/09/2014 07:13    Medications:  I have reviewed medications. Scheduled Meds: . antiseptic oral rinse  7 mL Mouth Rinse QID  . busPIRone  7.5 mg Oral TID  . cefTAZidime (FORTAZ)  IV  1 g Intravenous Q12H  . chlorhexidine gluconate  15 mL Mouth Rinse BID  . digoxin  0.125 mg Oral Daily  . diltiazem  60 mg Oral 3 times per day  . famotidine (PEPCID) IV  20 mg Intravenous Q24H  . feeding supplement (PRO-STAT SUGAR FREE 64)  30 mL Per Tube BID  . free water  100 mL Per Tube 3 times per day  . gabapentin  300 mg Oral TID  . insulin aspart  0-9 Units Subcutaneous 6 times per day  . LORazepam  0.5 mg Intravenous BID  . methylPREDNISolone (SOLU-MEDROL) injection  40 mg Intravenous Daily  . pantoprazole (PROTONIX) IV  40 mg Intravenous Q24H  . QUEtiapine  25 mg Oral BID  . rivaroxaban  15 mg Oral Daily  . sertraline  50 mg Oral Daily  . sodium chloride  3 mL Intravenous Q12H   Continuous Infusions: . feeding supplement (VITAL AF 1.2 CAL) 1,000 mL (11/10/14 0600)  . fentaNYL infusion INTRAVENOUS 25 mcg/hr (11/10/14 0800)   PRN Meds:.sodium chloride, acetaminophen, fentaNYL, hydrALAZINE, levalbuterol, LORazepam, ondansetron (ZOFRAN) IV, sodium chloride    Assessment/Plan: Principal Problem:   Acute on chronic systolic CHF (congestive heart failure) (HCC) Active Problems:   Essential hypertension   Atrial flutter (HCC)   IPF (idiopathic pulmonary fibrosis) (HCC)   Acute respiratory failure with hypoxia (HCC)   Acute CHF (congestive heart failure) (HCC)   Respiratory failure (HCC)   Acute congestive heart failure (HCC)   Acute respiratory failure with hypoxemia (HCC)   Chronic anticoagulation   Elevated troponin  1. + Troponin:flat trend 0.33->0.34-->0.31. Likely due to strain . Symptoms are  not c/w ACS. He is a poor candidate for interventional procedures. No plans for stress testing at this point.  Chest pain if he truly understood question will check EKG and recheck troponin.   2. Atrial fib: rate has been in the 90s-low 100s. This AM with increased rate 130-150 Continue Digoxin, and Xarelto.  Will be restarting cardizem today   3. Pulmonary fibrosis: Suspect this is the primary cause of his hypoxemia and pulmonary HTN. CCM following   4. Moderate pulmonary Hypertension: Continue lasix for now. There has been issues with compliance with salt restriction in the past.   5. Chronic systolic CHF: Has an EF of 45% as of echo in Feb. 2016. Repeat echo with EF 45-50% with mild MR. Continue lasix for edema.   His lower ext edema is improving  6. HTN: was poorly controlled in the 180s systolic. Now with more stable BP 119-150 systolic.    7. NSVT: patient noted to have several runs  of NSVT possible a fib with aberrancy  in the 150s lasting 22-33 beats. Another episode of SVT. K+4.9 and Mg+. 2.2 HR currently in the 90s-low 100s. He is a poor candidate for BB therapy given his pulmonary fibrosis. Continue to monitor on telemetry. MD to assess and will provide further recommendations. EF 45-50%. Concern for pulmonary toxicity with amiodarone.   8. Acute resp failure on vent and sedated. CCM following  9.  AKI, cr up to 1.72 today K+ 4.9 monitor   LOS: 4 days      Markel Mergenthaler, Deloris Ping, MD  11/10/2014 8:40 AM    [facility] [address] [place],  [address] [place], [place]  [address] Pager [phone] Phone: [phone]; Fax: [phone]   [facility]  [address] [place], [place]  [address] [phone]   Fax [phone]

## 2014-11-10 NOTE — Progress Notes (Signed)
PULMONARY / CRITICAL CARE MEDICINE   Name: [name] MRN: [id number] DOB: [birth year]    ADMISSION DATE:  11/06/2014 CONSULTATION DATE:  11/07/2014   REFERRING MD :  Roda Shutters  CHIEF COMPLAINT:  Shortness of breath  INITIAL PRESENTATION: 81 year old male with a past medical history significant for pulmonary fibrosis was admitted on 11/07/2014 with acute hypoxemic respiratory failure in the setting of increasing leg swelling. He has known ischemic cardiomyopathy.   STUDIES:  11/06/2014 CT angiogram chest no pulmonary embolism, there is diffuse bilateral airspace disease with small pleural effusions consistent with pulmonary edema on top of a background of significant pulmonary fibrosis. 11/07/2014 echocardiogram> LVEF 45-50%, mild left atrium dilation, RV moderately dilated, PAS 62mm Hg  SIGNIFICANT EVENTS: 10/19 worsening hypoxemia, required intubation   SUBJECTIVE:  Still has borderline trials on vent. No major events overnight.   VITAL SIGNS: Temp:  [97.9 F (36.6 C)-99.1 F (37.3 C)] 98.4 F (36.9 C) (10/22 0800) Pulse Rate:  [38-107] 92 (10/22 0800) Resp:  [15-21] 15 (10/22 0800) BP: (95-158)/(50-101) 139/101 mmHg (10/22 0800) SpO2:  [97 %-100 %] 99 % (10/22 0800) FiO2 (%):  [30 %-40 %] 40 % (10/22 0819) Weight:  [197 lb 12 oz (89.7 kg)] 197 lb 12 oz (89.7 kg) (10/22 0500)   HEMODYNAMICS:     VENTILATOR SETTINGS: Vent Mode:  [-] PSV;CPAP FiO2 (%):  [30 %-40 %] 40 % Set Rate:  [18 bmp] 18 bmp Vt Set:  [550 mL] 550 mL PEEP:  [5 cmH20-8 cmH20] 5 cmH20 Pressure Support:  [5 cmH20] 5 cmH20 Plateau Pressure:  [23 cmH20-28 cmH20] 27 cmH20   INTAKE / OUTPUT:  Intake/Output Summary (Last 24 hours) at 11/10/14 0956 Last data filed at 11/10/14 0800  Gross per 24 hour  Intake   2110 ml  Output   2260 ml  Net   -150 ml    PHYSICAL EXAMINATION: General:  Elderly male. No distress Neuro:  Sedated on vent HEENT:  ETT in place. Cardiovascular:  Irregularly irregular,  No MRG Lungs:  Clear anterior, Basal crackles. Abdomen:  Soft, + BS Skin:  Mild skin bruising, significant edema in legs bilaterally  LABS:  CBC  Recent Labs Lab 11/08/14 1153 11/09/14 0430 11/10/14 0500  WBC 17.0* 15.4* 11.7*  HGB 12.9* 12.7* 11.9*  HCT 39.3 39.0 37.5*  PLT 216 260 220   Coag's No results for input(s): APTT, INR in the last 168 hours.   BMET  Recent Labs Lab 11/08/14 0539 11/09/14 0430 11/10/14 0500  NA 141 141 144  K 3.9 4.9 4.3  CL 102 103 106  CO2 30 30 32  BUN 28* 53* 72*  CREATININE 1.10 1.72* 1.70*  GLUCOSE 155* 140* 126*   Electrolytes  Recent Labs Lab 11/07/14 2050 11/08/14 0539 11/09/14 0430 11/10/14 0500  CALCIUM 8.7* 8.7* 8.5* 8.5*  MG 2.2 2.2  --   --    Sepsis Markers  Recent Labs Lab 11/06/14 1534  LATICACIDVEN 1.21   ABG  Recent Labs Lab 11/07/14 1700 11/08/14 1158 11/09/14 1130  PHART 7.348* 7.429 7.398  PCO2ART 52.6* 41.4 46.7*  PO2ART 239* 49.8* 115*   Liver Enzymes  Recent Labs Lab 11/06/14 1526  AST 27  ALT 37  ALKPHOS 80  BILITOT 1.3*  ALBUMIN 3.5   Cardiac Enzymes  Recent Labs Lab 11/07/14 1030 11/09/14 0930 11/09/14 1525  TROPONINI 0.29* 0.43* 0.44*   Glucose  Recent Labs Lab 11/09/14 1153 11/09/14 1549 11/09/14 2017 11/10/14 0021 11/10/14 0433 11/10/14 0825  GLUCAP 144* 146* 169* 151* 138* 120*    Imaging 10/21 CXR > bilateral opacities throughout the lungs, fibrotic changes, ? Mild superimposed edema   ASSESSMENT / PLAN:  PULMONARY A:  Acute respiratory failure with hypoxemia in the setting of acute pulmonary edema with a background of idiopathic pulmonary fibrosis. Required intubation yesterday; doubt IPF flare or HCAP.  Hoping for a quick response to diuretic medications given his prior episodes of the same.  P:   Continue full vent support with 8cc/kg TVol, 8 PEEP Check ABG in one hour (after peep reduction) Wean O2/PEEP for O2 sat > 90% CXR tomorrow Daily  WUA/SBT  Low dose solumedrol to continue Hold diuresis 10/22  CARDIOVASCULAR A:  Chest Pain - reported 10/21 Acute decompensated heart failure, systolic A. fib with RVR > improved 10/20 Frequent ectopy on telemetry monitoring P:  Appreciate cardiology input Afib management per cardiology  Continue digoxin Xarelto adjusted for renal clearance, appreciate pharmacy input  RENAL A:   Mild hypokalemia in the setting of diuresis AKI  P:   Monitor BMET and UOP Replace electrolytes as needed  GASTROINTESTINAL A:   No acute issues P:   TF per nutrition  Protonix + Famotidine for stress ulcer prophylaxis, dose adjusted (home meds) NPO   HEMATOLOGIC A:   No acute issues P:  Monitor for bleeding  INFECTIOUS A:   Possible healthcare associated pneumonia P:   BCx2 10/18 >>   10/18 vancomycin >> 10/21 10/18 Ceftazidime >>   Check procalcitonin Monitor fever curve / leukocytosis   ENDOCRINE A:   Hyperglycemia P:   SSI Q4  NEUROLOGIC A:   Severe anxiety P:   Ativan 0.5 mg BID + 0.5-1mg  Q4 PRN Continue zoloft, seroquel   FAMILY  - Updates: No family available 10/22.  I will discuss with sister again today. If no improvement then we will recommend one way extubation to HFNC with comfort if he deteriorates.   - Inter-disciplinary family meet or Palliative Care meeting due by:   11/13/2014   CODE STATUS: Currently full code.   Chilton Greathouse MD [facility] Pager [phone] If no answer or after 3pm call: [phone] 11/10/2014, 10:00 AM

## 2014-11-11 ENCOUNTER — Inpatient Hospital Stay (HOSPITAL_COMMUNITY): Payer: Medicare Other

## 2014-11-11 LAB — BASIC METABOLIC PANEL
ANION GAP: 7 (ref 5–15)
BUN: 76 mg/dL — ABNORMAL HIGH (ref 6–20)
CALCIUM: 8.6 mg/dL — AB (ref 8.9–10.3)
CO2: 32 mmol/L (ref 22–32)
CREATININE: 1.42 mg/dL — AB (ref 0.61–1.24)
Chloride: 108 mmol/L (ref 101–111)
GFR calc non Af Amer: 45 mL/min — ABNORMAL LOW (ref >60)
GFR, EST AFRICAN AMERICAN: 52 mL/min — AB (ref >60)
Glucose, Bld: 144 mg/dL — ABNORMAL HIGH (ref 65–99)
Potassium: 4.2 mmol/L (ref 3.5–5.1)
SODIUM: 147 mmol/L — AB (ref 135–145)

## 2014-11-11 LAB — CBC
HEMATOCRIT: 38 % — AB (ref 39.0–52.0)
HEMOGLOBIN: 12.1 g/dL — AB (ref 13.0–17.0)
MCH: 32.3 pg (ref 26.0–34.0)
MCHC: 31.8 g/dL (ref 30.0–36.0)
MCV: 101.3 fL — ABNORMAL HIGH (ref 78.0–100.0)
Platelets: 221 10*3/uL (ref 150–400)
RBC: 3.75 MIL/uL — ABNORMAL LOW (ref 4.22–5.81)
RDW: 17.2 % — AB (ref 11.5–15.5)
WBC: 13.5 10*3/uL — AB (ref 4.0–10.5)

## 2014-11-11 LAB — CULTURE, BLOOD (ROUTINE X 2)
CULTURE: NO GROWTH
Culture: NO GROWTH

## 2014-11-11 LAB — GLUCOSE, CAPILLARY
GLUCOSE-CAPILLARY: 130 mg/dL — AB (ref 65–99)
GLUCOSE-CAPILLARY: 135 mg/dL — AB (ref 65–99)
GLUCOSE-CAPILLARY: 157 mg/dL — AB (ref 65–99)
Glucose-Capillary: 143 mg/dL — ABNORMAL HIGH (ref 65–99)
Glucose-Capillary: 145 mg/dL — ABNORMAL HIGH (ref 65–99)
Glucose-Capillary: 174 mg/dL — ABNORMAL HIGH (ref 65–99)

## 2014-11-11 LAB — C DIFFICILE QUICK SCREEN W PCR REFLEX
C DIFFICLE (CDIFF) ANTIGEN: POSITIVE — AB
C Diff interpretation: POSITIVE
C Diff toxin: POSITIVE — AB

## 2014-11-11 LAB — MAGNESIUM: MAGNESIUM: 2.8 mg/dL — AB (ref 1.7–2.4)

## 2014-11-11 LAB — PHOSPHORUS: PHOSPHORUS: 3 mg/dL (ref 2.5–4.6)

## 2014-11-11 MED ORDER — FUROSEMIDE 10 MG/ML IJ SOLN
40.0000 mg | Freq: Two times a day (BID) | INTRAMUSCULAR | Status: DC
  Administered 2014-11-11 – 2014-11-13 (×5): 40 mg via INTRAVENOUS
  Filled 2014-11-11 (×5): qty 4

## 2014-11-11 MED ORDER — VANCOMYCIN 50 MG/ML ORAL SOLUTION
125.0000 mg | Freq: Four times a day (QID) | ORAL | Status: DC
  Administered 2014-11-11 – 2014-11-14 (×9): 125 mg via ORAL
  Filled 2014-11-11 (×19): qty 2.5

## 2014-11-11 MED ORDER — SODIUM CHLORIDE 0.9 % IV SOLN
Freq: Once | INTRAVENOUS | Status: AC
  Administered 2014-11-11: 10:00:00 via INTRAVENOUS

## 2014-11-11 NOTE — Progress Notes (Signed)
Patient Profile: Patient is a 81 y.o. male with a PMHx of atrial fib, pulmonary fibrosis, Chronic diastolic CHF, HTN, OSA, , who was admitted to [facility] on 11/06/2014 for evaluation of worsening dyspnea. He was found to have a positive Troponin and we were asked to see. Also with runs of NSVT on telemetry.   11/07/14 increased Resp distress and intubated 10/19   Subjective: alseep this am   Objective: Vital signs in last 24 hours: Temp:  [97.9 F (36.6 C)-99.3 F (37.4 C)] 99.1 F (37.3 C) (10/23 0800) Pulse Rate:  [39-157] 103 (10/23 0800) Resp:  [12-18] 17 (10/23 0800) BP: (108-197)/(52-115) 119/76 mmHg (10/23 0800) SpO2:  [95 %-100 %] 100 % (10/23 0800) FiO2 (%):  [30 %-40 %] 30 % (10/23 0751) Weight:  [198 lb 13.7 oz (90.2 kg)] 198 lb 13.7 oz (90.2 kg) (10/23 0500) Weight change: 1 lb 1.6 oz (0.5 kg) Last BM Date: 11/06/14 Intake/Output from previous day: +361  Wt up 3 lbs to 199 from 196 10/22 0701 - 10/23 0700 In: 1872.5 [I.V.:352.5; NG/GT:1370; IV Piggyback:150] Out: 800 [Urine:800] Intake/Output this shift: Total I/O In: 15 [I.V.:15] Out: 125 [Urine:125]  PE: General:Pleasant affect, sedated but wakes when name called. On vent Skin:Warm and dry, brisk capillary refill HEENT:normocephalic, sclera clear, mucus membranes moist Heart:irreg irreg without murmur, gallup, rub or click Lungs:clear, ant- without rales, rhonchi, or wheezes WUJ:WJXB, non tender, + BS, do not palpate liver spleen or masses JYN:WGNFAOZH lower ext edema now 1+, 2+ radial pulses Neuro:alert and oriented, MAE, follows commands, + facial symmetry  Tele:   A fib  With well controlled rate    Lab Results:  Recent Labs  11/10/14 0500 11/11/14 0455  WBC 11.7* 13.5*  HGB 11.9* 12.1*  HCT 37.5* 38.0*  PLT 220 221   BMET  Recent Labs  11/10/14 0500 11/11/14 0455  NA 144 147*  K 4.3 4.2  CL 106 108  CO2 32 32  GLUCOSE 126* 144*  BUN 72* 76*  CREATININE 1.70* 1.42*    CALCIUM 8.5* 8.6*    Recent Labs  11/09/14 0930 11/09/14 1525  TROPONINI 0.43* 0.44*    Lab Results  Component Value Date   CHOL 172 08/16/2013   HDL 31.80* 08/16/2013   LDLCALC 125* 08/16/2013   TRIG 77.0 08/16/2013   CHOLHDL 5 08/16/2013   Lab Results  Component Value Date   HGBA1C 6.2 06/20/2013     Lab Results  Component Value Date   TSH 1.12 05/24/2014    Hepatic Function Panel No results for input(s): PROT, ALBUMIN, AST, ALT, ALKPHOS, BILITOT, BILIDIR, IBILI in the last 72 hours. No results for input(s): CHOL in the last 72 hours. No results for input(s): PROTIME in the last 72 hours.     Studies/Results: Dg Chest Port 1 View  11/11/2014  CLINICAL DATA:  Acute respiratory failure, hypertension, former smoker, pulmonary fibrosis, atrial fibrillation, chronic diastolic CHF EXAM: PORTABLE CHEST 1 VIEW COMPARISON:  Portable exam 0443 hours compared to 11/09/2014 FINDINGS: Tip of endotracheal tube projects 4.3 cm above carina. Nasogastric tube extends into stomach. RIGHT jugular central venous catheter tip projects over cavoatrial junction. Enlargement of cardiac silhouette with pulmonary vascular congestion. Persistent severe diffuse interstitial and airspace infiltrates, unchanged. No definite pleural effusion or pneumothorax. IMPRESSION: Persistent severe diffuse BILATERAL pulmonary infiltrates. This may represent pulmonary fibrosis with superimposed edema or infection. Enlargement of cardiac silhouette with pulmonary vascular congestion. Electronically Signed   By: Ulyses Southward  M.D.   On: 11/11/2014 07:42   Dg Chest Port 1 View  11/09/2014  CLINICAL DATA:  Followup respiratory distress/ pulmonary edema. EXAM: PORTABLE CHEST 1 VIEW COMPARISON:  11/09/2014 at 0507 hours FINDINGS: The endotracheal to is 4 cm above the carina. The right IJ catheter is stable. The NG tube is stable. Persistent diffuse interstitial and airspace process in the lungs. No definite pleural  effusions. IMPRESSION: Stable support apparatus. Persistent diffuse interstitial and airspace process. Electronically Signed   By: Rudie Meyer M.D.   On: 11/09/2014 12:30    Medications:  I have reviewed medications. Scheduled Meds: . antiseptic oral rinse  7 mL Mouth Rinse QID  . busPIRone  7.5 mg Oral TID  . cefTAZidime (FORTAZ)  IV  1 g Intravenous Q12H  . chlorhexidine gluconate  15 mL Mouth Rinse BID  . digoxin  0.125 mg Oral Daily  . diltiazem  60 mg Oral 3 times per day  . famotidine (PEPCID) IV  20 mg Intravenous Q24H  . feeding supplement (PRO-STAT SUGAR FREE 64)  30 mL Per Tube BID  . free water  100 mL Per Tube 3 times per day  . gabapentin  300 mg Oral TID  . insulin aspart  0-9 Units Subcutaneous 6 times per day  . LORazepam  0.5 mg Intravenous BID  . methylPREDNISolone (SOLU-MEDROL) injection  40 mg Intravenous Daily  . pantoprazole (PROTONIX) IV  40 mg Intravenous Q24H  . QUEtiapine  25 mg Oral BID  . rivaroxaban  15 mg Oral Daily  . sertraline  50 mg Oral Daily  . sodium chloride  3 mL Intravenous Q12H   Continuous Infusions: . feeding supplement (VITAL AF 1.2 CAL) 1,000 mL (11/10/14 1242)  . fentaNYL infusion INTRAVENOUS 50 mcg/hr (11/11/14 0800)   PRN Meds:.sodium chloride, acetaminophen, fentaNYL, hydrALAZINE, levalbuterol, LORazepam, ondansetron (ZOFRAN) IV, sodium chloride    Assessment/Plan: Principal Problem:   Acute on chronic systolic CHF (congestive heart failure) (HCC) Active Problems:   Essential hypertension   Atrial flutter (HCC)   IPF (idiopathic pulmonary fibrosis) (HCC)   Acute respiratory failure with hypoxia (HCC)   Acute CHF (congestive heart failure) (HCC)   Respiratory failure (HCC)   Acute congestive heart failure (HCC)   Acute respiratory failure with hypoxemia (HCC)   Chronic anticoagulation   Elevated troponin  1. + Troponin:flat trend 0.33->0.34-->0.31. Likely due to strain . Symptoms are not c/w ACS. He is a poor  candidate for interventional procedures. No plans for stress testing at this point.     2. Atrial fib: rate has been in the 90s-low 100s. This AM with increased rate 130-150 Continue Digoxin, and Xarelto.  Will be restarting cardizem today   3. Pulmonary fibrosis: Suspect this is the primary cause of his hypoxemia and pulmonary HTN. CCM following   4. Moderate pulmonary Hypertension: Continue lasix for now. There has been issues with compliance with salt restriction in the past.   5. Chronic systolic CHF: Has an EF of 45% as of echo in Feb. 2016. Repeat echo with EF 45-50% with mild MR. Continue lasix for edema.   His lower ext edema is improving  6. HTN: was poorly controlled in the 180s systolic. Now with more stable BP 119-150 systolic.    7. NSVT: patient noted to have several runs of NSVT possible a fib with aberrancy  in the 150s lasting 22-33 beats. Another episode of SVT. K+4.9 and Mg+. 2.2 HR currently in the 90s-low 100s. He is a poor  candidate for BB therapy given his pulmonary fibrosis. Continue to monitor on telemetry. MD to assess and will provide further recommendations. EF 45-50%. Concern for pulmonary toxicity with amiodarone.   8. Acute resp failure on vent and sedated. CCM following Had an episode of respiratory distress while on the vent today .   ? Mucus plugging vs. Anxiety      LOS: 5 days      Nahser, Deloris Ping, MD  11/11/2014 8:40 AM    [facility] [address] [place],  [address] [place], [place]  [address] Pager [phone] Phone: [phone]; Fax: [phone]   [facility]  [address] [place], [place]  [address] [phone]   Fax [phone]

## 2014-11-11 NOTE — Progress Notes (Signed)
eLink Physician-Brief Progress Note Patient Name: [name] DOB: 08-11-1931 MRN: [id number]   Date of Service  11/11/2014  HPI/Events of Note  CDiff +  eICU Interventions  Started enteral vancomycin     Intervention Category Intermediate Interventions: Infection - evaluation and management  Dorothyann Gibbs 11/11/2014, 5:47 PM

## 2014-11-11 NOTE — Progress Notes (Signed)
Sputum culture collected. Sent to lab.  

## 2014-11-11 NOTE — Progress Notes (Signed)
PULMONARY / CRITICAL CARE MEDICINE   Name: [name] MRN: [id number] DOB: 1931-12-15    ADMISSION DATE:  11/06/2014 CONSULTATION DATE:  11/07/2014   REFERRING MD :  Roda Shutters  CHIEF COMPLAINT:  Shortness of breath  INITIAL PRESENTATION: 81 year old male with a past medical history significant for pulmonary fibrosis was admitted on 11/07/2014 with acute hypoxemic respiratory failure in the setting of increasing leg swelling. He has known ischemic cardiomyopathy.   STUDIES:  11/06/2014 CT angiogram chest no pulmonary embolism, there is diffuse bilateral airspace disease with small pleural effusions consistent with pulmonary edema on top of a background of significant pulmonary fibrosis. 11/07/2014 echocardiogram> LVEF 45-50%, mild left atrium dilation, RV moderately dilated, PAS 57mm Hg  SIGNIFICANT EVENTS: 10/19 worsening hypoxemia, required intubation   SUBJECTIVE:  Still has borderline trials on vent. No major events overnight.   VITAL SIGNS: Temp:  [98.1 F (36.7 C)-99.3 F (37.4 C)] 98.6 F (37 C) (10/23 1100) Pulse Rate:  [45-121] 82 (10/23 1100) Resp:  [8-18] 8 (10/23 1100) BP: (103-190)/(52-92) 103/66 mmHg (10/23 1100) SpO2:  [96 %-100 %] 100 % (10/23 1100) FiO2 (%):  [30 %-40 %] 30 % (10/23 1133) Weight:  [198 lb 13.7 oz (90.2 kg)] 198 lb 13.7 oz (90.2 kg) (10/23 0500)   HEMODYNAMICS:     VENTILATOR SETTINGS: Vent Mode:  [-] PRVC FiO2 (%):  [30 %-40 %] 30 % Set Rate:  [18 bmp] 18 bmp Vt Set:  [550 mL] 550 mL PEEP:  [5 cmH20] 5 cmH20 Plateau Pressure:  [27 cmH20-31 cmH20] 30 cmH20   INTAKE / OUTPUT:  Intake/Output Summary (Last 24 hours) at 11/11/14 1223 Last data filed at 11/11/14 1100  Gross per 24 hour  Intake   2075 ml  Output    800 ml  Net   1275 ml    PHYSICAL EXAMINATION: General:  Elderly male. No distress Neuro:  Sedated on vent HEENT:  ETT in place. Cardiovascular:  Irregularly irregular, No MRG Lungs:  Clear anterior, Basal  crackles. Abdomen:  Soft, + BS Skin:  Mild skin bruising, significant edema in legs bilaterally  LABS:  CBC  Recent Labs Lab 11/09/14 0430 11/10/14 0500 11/11/14 0455  WBC 15.4* 11.7* 13.5*  HGB 12.7* 11.9* 12.1*  HCT 39.0 37.5* 38.0*  PLT 260 220 221   Coag's No results for input(s): APTT, INR in the last 168 hours.   BMET  Recent Labs Lab 11/09/14 0430 11/10/14 0500 11/11/14 0455  NA 141 144 147*  K 4.9 4.3 4.2  CL 103 106 108  CO2 30 32 32  BUN 53* 72* 76*  CREATININE 1.72* 1.70* 1.42*  GLUCOSE 140* 126* 144*   Electrolytes  Recent Labs Lab 11/07/14 2050 11/08/14 0539 11/09/14 0430 11/10/14 0500 11/11/14 0455  CALCIUM 8.7* 8.7* 8.5* 8.5* 8.6*  MG 2.2 2.2  --   --  2.8*  PHOS  --   --   --   --  3.0   Sepsis Markers  Recent Labs Lab 11/06/14 1534  LATICACIDVEN 1.21   ABG  Recent Labs Lab 11/07/14 1700 11/08/14 1158 11/09/14 1130  PHART 7.348* 7.429 7.398  PCO2ART 52.6* 41.4 46.7*  PO2ART 239* 49.8* 115*   Liver Enzymes  Recent Labs Lab 11/06/14 1526  AST 27  ALT 37  ALKPHOS 80  BILITOT 1.3*  ALBUMIN 3.5   Cardiac Enzymes  Recent Labs Lab 11/07/14 1030 11/09/14 0930 11/09/14 1525  TROPONINI 0.29* 0.43* 0.44*   Glucose  Recent Labs Lab 11/10/14  0825 11/10/14 1159 11/10/14 1549 11/10/14 1930 11/10/14 2337 11/11/14 0821  GLUCAP 120* 137* 178* 164* 135* 145*    Imaging 10/21 CXR > bilateral opacities throughout the lungs, fibrotic changes, ? Mild superimposed edema   ASSESSMENT / PLAN:  PULMONARY A:  Acute respiratory failure with hypoxemia in the setting of acute pulmonary edema with a background of idiopathic pulmonary fibrosis. Required intubation yesterday; doubt IPF flare or HCAP.  Hoping for a quick response to diuretic medications given his prior episodes of the same.  P:   Continue full vent support with 8cc/kg TVol, 8 PEEP Check ABG in one hour (after peep reduction) Wean O2/PEEP for O2 sat >  90% CXR tomorrow Daily WUA/SBT  Low dose solumedrol to continue Restart lasix  CARDIOVASCULAR A:  Chest Pain - reported 10/21 Acute decompensated heart failure, systolic A. fib with RVR > improved 10/20 Frequent ectopy on telemetry monitoring P:  Appreciate cardiology input Afib management per cardiology  Continue digoxin Xarelto adjusted for renal clearance, appreciate pharmacy input  RENAL A:   Mild hypokalemia in the setting of diuresis AKI  P:   Monitor BMET and UOP Replace electrolytes as needed  GASTROINTESTINAL A:   No acute issues P:   TF per nutrition  Protonix + Famotidine for stress ulcer prophylaxis, dose adjusted (home meds) NPO   HEMATOLOGIC A:   No acute issues P:  Monitor for bleeding  INFECTIOUS A:   Possible healthcare associated pneumonia P:   BCx2 10/18 >>   10/18 vancomycin >> 10/21 10/18 Ceftazidime >>   Check procalcitonin Monitor fever curve / leukocytosis   ENDOCRINE A:   Hyperglycemia P:   SSI Q4  NEUROLOGIC A:   Severe anxiety P:   Ativan 0.5 mg BID + 0.5-1mg  Q4 PRN Continue zoloft, seroquel  FAMILY  - Updates: No family available 10/22.  I will discussed with sister yesterday. We will continue to optimize his resp status. He will likely undergo one way extubation to HFNC tomorrow with comfort if he deteriorates.   - Inter-disciplinary family meet or Palliative Care meeting due by:   11/13/2014   CODE STATUS: Currently full code.   Chilton Greathouse MD [facility] Pager [phone] If no answer or after 3pm call: [phone] 11/11/2014, 12:23 PM

## 2014-11-12 ENCOUNTER — Inpatient Hospital Stay (HOSPITAL_COMMUNITY): Payer: Medicare Other

## 2014-11-12 ENCOUNTER — Encounter (HOSPITAL_COMMUNITY): Payer: Self-pay | Admitting: Physician Assistant

## 2014-11-12 LAB — GLUCOSE, CAPILLARY
GLUCOSE-CAPILLARY: 159 mg/dL — AB (ref 65–99)
Glucose-Capillary: 152 mg/dL — ABNORMAL HIGH (ref 65–99)
Glucose-Capillary: 116 mg/dL — ABNORMAL HIGH (ref 65–99)
Glucose-Capillary: 139 mg/dL — ABNORMAL HIGH (ref 65–99)

## 2014-11-12 LAB — CBC
HCT: 41.8 % (ref 39.0–52.0)
Hemoglobin: 13 g/dL (ref 13.0–17.0)
MCH: 31.9 pg (ref 26.0–34.0)
MCHC: 31.1 g/dL (ref 30.0–36.0)
MCV: 102.5 fL — ABNORMAL HIGH (ref 78.0–100.0)
Platelets: 231 10*3/uL (ref 150–400)
RBC: 4.08 MIL/uL — ABNORMAL LOW (ref 4.22–5.81)
RDW: 17.4 % — ABNORMAL HIGH (ref 11.5–15.5)
WBC: 16.9 10*3/uL — ABNORMAL HIGH (ref 4.0–10.5)

## 2014-11-12 LAB — BASIC METABOLIC PANEL
Anion gap: 8 (ref 5–15)
BUN: 71 mg/dL — ABNORMAL HIGH (ref 6–20)
CO2: 33 mmol/L — ABNORMAL HIGH (ref 22–32)
Calcium: 8.4 mg/dL — ABNORMAL LOW (ref 8.9–10.3)
Chloride: 109 mmol/L (ref 101–111)
Creatinine, Ser: 1.47 mg/dL — ABNORMAL HIGH (ref 0.61–1.24)
GFR calc Af Amer: 50 mL/min — ABNORMAL LOW (ref >60)
GFR calc non Af Amer: 43 mL/min — ABNORMAL LOW (ref >60)
Glucose, Bld: 124 mg/dL — ABNORMAL HIGH (ref 65–99)
Potassium: 4.1 mmol/L (ref 3.5–5.1)
Sodium: 150 mmol/L — ABNORMAL HIGH (ref 135–145)

## 2014-11-12 LAB — MAGNESIUM: Magnesium: 2.6 mg/dL — ABNORMAL HIGH (ref 1.7–2.4)

## 2014-11-12 LAB — PHOSPHORUS: Phosphorus: 3.3 mg/dL (ref 2.5–4.6)

## 2014-11-12 MED ORDER — FREE WATER
300.0000 mL | Freq: Four times a day (QID) | Status: DC
  Administered 2014-11-12 – 2014-11-13 (×4): 300 mL

## 2014-11-12 MED ORDER — FENTANYL CITRATE (PF) 100 MCG/2ML IJ SOLN
INTRAMUSCULAR | Status: AC
  Administered 2014-11-12: 25 ug
  Filled 2014-11-12: qty 2

## 2014-11-12 NOTE — Progress Notes (Signed)
PULMONARY / CRITICAL CARE MEDICINE   Name: [name] MRN: [id number] DOB: 06-25-1931    ADMISSION DATE:  11/06/2014 CONSULTATION DATE:  11/07/2014   REFERRING MD :  Roda Shutters  CHIEF COMPLAINT:  Shortness of breath  INITIAL PRESENTATION: 81 year old male with a past medical history significant for pulmonary fibrosis was admitted on 11/07/2014 with acute hypoxemic respiratory failure in the setting of increasing leg swelling. He has known ischemic cardiomyopathy.  STUDIES:  11/06/2014 CT angiogram chest no pulmonary embolism, there is diffuse bilateral airspace disease with small pleural effusions consistent with pulmonary edema on top of a background of significant pulmonary fibrosis. 11/07/2014 echocardiogram> LVEF 45-50%, mild left atrium dilation, RV moderately dilated, PAS 66mm Hg  SIGNIFICANT EVENTS: 10/19 worsening hypoxemia, required intubation   SUBJECTIVE: Doing well on weaning trial today.  VITAL SIGNS: Temp:  [97.4 F (36.3 C)-99.5 F (37.5 C)] 99.1 F (37.3 C) (10/24 0800) Pulse Rate:  [25-91] 40 (10/24 0800) Resp:  [0-22] 18 (10/24 0800) BP: (94-149)/(45-92) 135/85 mmHg (10/24 0800) SpO2:  [97 %-100 %] 100 % (10/24 0800) FiO2 (%):  [30 %] 30 % (10/24 0822) Weight:  [190 lb 4.1 oz (86.3 kg)] 190 lb 4.1 oz (86.3 kg) (10/24 0500)   HEMODYNAMICS:     VENTILATOR SETTINGS: Vent Mode:  [-] PRVC FiO2 (%):  [30 %] 30 % Set Rate:  [18 bmp] 18 bmp Vt Set:  [550 mL] 550 mL PEEP:  [5 cmH20] 5 cmH20 Plateau Pressure:  [24 cmH20-30 cmH20] 26 cmH20   INTAKE / OUTPUT:  Intake/Output Summary (Last 24 hours) at 11/12/14 0928 Last data filed at 11/12/14 0831  Gross per 24 hour  Intake 1474.04 ml  Output   5035 ml  Net -3560.96 ml    PHYSICAL EXAMINATION: General:  Elderly male. No distress Neuro:  Sedated on vent HEENT:  ETT in place. Cardiovascular:  Irregularly irregular, No MRG Lungs:  Clear anterior, Basal crackles. Abdomen:  Soft, + BS Skin:  Mild skin  bruising, significant edema in legs bilaterally  LABS:  CBC  Recent Labs Lab 11/10/14 0500 11/11/14 0455 11/12/14 0530  WBC 11.7* 13.5* 16.9*  HGB 11.9* 12.1* 13.0  HCT 37.5* 38.0* 41.8  PLT 220 221 231   Coag's No results for input(s): APTT, INR in the last 168 hours.   BMET  Recent Labs Lab 11/10/14 0500 11/11/14 0455 11/12/14 0530  NA 144 147* 150*  K 4.3 4.2 4.1  CL 106 108 109  CO2 32 32 33*  BUN 72* 76* 71*  CREATININE 1.70* 1.42* 1.47*  GLUCOSE 126* 144* 124*   Electrolytes  Recent Labs Lab 11/08/14 0539  11/10/14 0500 11/11/14 0455 11/12/14 0530  CALCIUM 8.7*  < > 8.5* 8.6* 8.4*  MG 2.2  --   --  2.8* 2.6*  PHOS  --   --   --  3.0 3.3  < > = values in this interval not displayed. Sepsis Markers  Recent Labs Lab 11/06/14 1534  LATICACIDVEN 1.21   ABG  Recent Labs Lab 11/07/14 1700 11/08/14 1158 11/09/14 1130  PHART 7.348* 7.429 7.398  PCO2ART 52.6* 41.4 46.7*  PO2ART 239* 49.8* 115*   Liver Enzymes  Recent Labs Lab 11/06/14 1526  AST 27  ALT 37  ALKPHOS 80  BILITOT 1.3*  ALBUMIN 3.5   Cardiac Enzymes  Recent Labs Lab 11/07/14 1030 11/09/14 0930 11/09/14 1525  TROPONINI 0.29* 0.43* 0.44*   Glucose  Recent Labs Lab 11/11/14 1250 11/11/14 1714 11/11/14 2006 11/12/14 0110  11/12/14 0459 11/12/14 0817  GLUCAP 130* 174* 157* 152* 116* 139*    Imaging 10/21 CXR > bilateral opacities throughout the lungs, fibrotic changes, ? Mild superimposed edema   ASSESSMENT / PLAN:  PULMONARY A:  Acute respiratory failure with hypoxemia in the setting of acute pulmonary edema with a background of idiopathic pulmonary fibrosis. Required intubation yesterday; doubt IPF flare or HCAP.  Hoping for a quick response to diuretic medications given his prior episodes of the same.  P:   Continue full vent support with 8cc/kg TVol, 8 PEEP Check ABG in one hour (after peep reduction) Wean O2/PEEP for O2 sat > 90% Follow CXR Plan on  one way extubation after family gets here.  Low dose solumedrol to continue Restart lasix  CARDIOVASCULAR A:  Chest Pain - reported 10/21 Acute decompensated heart failure, systolic A. fib with RVR > improved 10/20 Frequent ectopy on telemetry monitoring P:  Appreciate cardiology input Afib management per cardiology  Continue digoxin Xarelto adjusted for renal clearance, appreciate pharmacy input  RENAL A:   Mild hypokalemia in the setting of diuresis AKI  P:   Monitor BMET and UOP Replace electrolytes as needed  GASTROINTESTINAL A:   No acute issues P:   TF per nutrition  Protonix + Famotidine for stress ulcer prophylaxis, dose adjusted (home meds) NPO  Free water for hypernatremia  HEMATOLOGIC A:   No acute issues P:  Monitor for bleeding  INFECTIOUS A:   Possible healthcare associated pneumonia P:   BCx2 10/18 >>   10/18 vancomycin >> 10/21 10/18 Ceftazidime >>   Stop all abx today Check procalcitonin Monitor fever curve / leukocytosis   ENDOCRINE A:   Hyperglycemia P:   SSI Q4  NEUROLOGIC A:   Severe anxiety P:   Ativan 0.5 mg BID + 0.5-1mg  Q4 PRN Continue zoloft, seroquel  FAMILY  - Updates: No family available 10/23.  I \\discussed  with sister over the weekend. We will continue to optimize his resp status. He will likely undergo one way extubation to HFNC today, with comfort if he deteriorates.   - Inter-disciplinary family meet or Palliative Care meeting due by:   11/13/2014   CODE STATUS: Currently full code.   Chilton Greathouse MD [facility] Pager [phone] If no answer or after 3pm call: [phone] 11/12/2014, 9:28 AM

## 2014-11-12 NOTE — Progress Notes (Signed)
Wasted approx 100cc of expired fentanyl infusion in sink. Witnessed by Vickki Hearing, RN.

## 2014-11-12 NOTE — Progress Notes (Signed)
Burna Mortimer Maisie Fus (daughter and son) arrived to unit.  They have been in contact with his sister Nita Sells.  Discussed case with children.  They would like some time to discuss plan of care with their Aunt.  They state they do not think he would want to live on machines / prolonged therapies.     Plan: Plan for wake up assessment when family arrives on unit in am.  If he appears to wean reasonably well, will extubate to high flow O2.  If not, will proceed toward comfort measures once confirmed with family.   Canary Brim, NP-C  Pulmonary & Critical Care Pgr: [phone] or if no answer [phone] 11/12/2014, 1:31 PM

## 2014-11-12 NOTE — Progress Notes (Signed)
Patient Name: [name] Date of Encounter: 11/12/2014     Principal Problem:   Acute on chronic systolic CHF (congestive heart failure) (HCC) Active Problems:   Essential hypertension   Atrial flutter (HCC)   IPF (idiopathic pulmonary fibrosis) (HCC)   Acute respiratory failure with hypoxia (HCC)   Acute CHF (congestive heart failure) (HCC)   Respiratory failure (HCC)   Acute congestive heart failure (HCC)   Acute respiratory failure with hypoxemia (HCC)   Chronic anticoagulation   Elevated troponin    SUBJECTIVE  On ventilator. Opens his eye but does not answer my questions. Incontinent of stool.    CURRENT MEDS . antiseptic oral rinse  7 mL Mouth Rinse QID  . busPIRone  7.5 mg Oral TID  . cefTAZidime (FORTAZ)  IV  1 g Intravenous Q12H  . chlorhexidine gluconate  15 mL Mouth Rinse BID  . digoxin  0.125 mg Oral Daily  . diltiazem  60 mg Oral 3 times per day  . famotidine (PEPCID) IV  20 mg Intravenous Q24H  . feeding supplement (PRO-STAT SUGAR FREE 64)  30 mL Per Tube BID  . free water  300 mL Per Tube Q6H  . furosemide  40 mg Intravenous BID  . gabapentin  300 mg Oral TID  . insulin aspart  0-9 Units Subcutaneous 6 times per day  . LORazepam  0.5 mg Intravenous BID  . methylPREDNISolone (SOLU-MEDROL) injection  40 mg Intravenous Daily  . pantoprazole (PROTONIX) IV  40 mg Intravenous Q24H  . QUEtiapine  25 mg Oral BID  . rivaroxaban  15 mg Oral Daily  . sertraline  50 mg Oral Daily  . sodium chloride  3 mL Intravenous Q12H  . vancomycin  125 mg Oral 4 times per day    OBJECTIVE  Filed Vitals:   11/12/14 0500 11/12/14 0600 11/12/14 0700 11/12/14 0800  BP: 131/75 94/47 119/63 135/85  Pulse: 77 51 25 40  Temp: 99 F (37.2 C) 98.8 F (37.1 C) 98.8 F (37.1 C) 99.1 F (37.3 C)  TempSrc:   Core (Comment) Core (Comment)  Resp: 16 18 16 18   Height:      Weight: 190 lb 4.1 oz (86.3 kg)     SpO2: 99% 100% 99% 100%    Intake/Output Summary (Last 24  hours) at 11/12/14 0943 Last data filed at 11/12/14 0831  Gross per 24 hour  Intake 1474.04 ml  Output   5035 ml  Net -3560.96 ml   Filed Weights   11/10/14 0500 11/11/14 0500 11/12/14 0500  Weight: 197 lb 12 oz (89.7 kg) 198 lb 13.7 oz (90.2 kg) 190 lb 4.1 oz (86.3 kg)    PHYSICAL EXAM  General: Pleasant, NAD. On vent. Opens eye but does not answer questions. Appears lethargic and very ill.  Neuro: Alert and oriented X 3. Moves all extremities spontaneously. Psych: Normal affect. HEENT:  Normal  Neck: Supple without bruits or JVD. Lungs:  Resp regular and unlabored, on vent.  Heart: RRR no s3, s4, or murmurs. Abdomen: Soft, non-tender, non-distended, BS + x 4.  Extremities: No clubbing, cyanosis or edema. DP/PT/Radials 2+ and equal bilaterally. Petechiae on LEs and 2+ LE.   Accessory Clinical Findings  CBC  Recent Labs  11/11/14 0455 11/12/14 0530  WBC 13.5* 16.9*  HGB 12.1* 13.0  HCT 38.0* 41.8  MCV 101.3* 102.5*  PLT 221 231   Basic Metabolic Panel  Recent Labs  11/11/14 0455 11/12/14 0530  NA 147* 150*  K 4.2 4.1  CL 108 109  CO2 32 33*  GLUCOSE 144* 124*  BUN 76* 71*  CREATININE 1.42* 1.47*  CALCIUM 8.6* 8.4*  MG 2.8* 2.6*  PHOS 3.0 3.3   Liver Function Tests No results for input(s): AST, ALT, ALKPHOS, BILITOT, PROT, ALBUMIN in the last 72 hours. No results for input(s): LIPASE, AMYLASE in the last 72 hours. Cardiac Enzymes  Recent Labs  11/09/14 1525  TROPONINI 0.44*     TELE  Atrial fib with freq PVCs and NSVT up to 22 beat run. HR ins 80-100s.   Radiology/Studies  Dg Chest 2 View  11/06/2014  CLINICAL DATA:  Short of breath EXAM: CHEST  2 VIEW COMPARISON:  10/08/2014 FINDINGS: Chronic interstitial fibrosis is noted. Progression of bilateral airspace disease compared with prior studies suggestive of superimposed acute process such as edema or bilateral pneumonia. No significant pleural effusion. Cardiac enlargement. IMPRESSION: Chronic  interstitial fibrosis. Superimposed acute airspace disease bilaterally may be due to edema or pneumonia bilaterally. Electronically Signed   By: Marlan Palau M.D.   On: 11/06/2014 14:28   Ct Angio Chest Pe W/cm &/or Wo Cm  11/06/2014  CLINICAL DATA:  Dyspnea.  Hypoxia.  COPD.  Hemoptysis. EXAM: CT ANGIOGRAPHY CHEST WITH CONTRAST TECHNIQUE: Multidetector CT imaging of the chest was performed using the standard protocol during bolus administration of intravenous contrast. Multiplanar CT image reconstructions and MIPs were obtained to evaluate the vascular anatomy. CONTRAST:  OMNIPAQUE IOHEXOL 350 MG/ML SOLN COMPARISON:  Multiple exams, including 11/06/2014 hand 08/21/2013. FINDINGS: Mediastinum/Nodes: No filling defect is identified in the pulmonary arterial tree to suggest pulmonary embolus. Coronary, aortic arch, and branch vessel atherosclerotic vascular disease. Moderate cardiomegaly. Interventricular septum thickness 2.1 cm compatible with left ventricular hypertrophy. Scattered mild mediastinal adenopathy. Index prevascular node 1.2 cm, image 37 series 4. Index right lower paratracheal node 1.7 cm, image 33 series 4. Lungs/Pleura: Chronic peripheral coarse interstitial accentuation compatible with fibrosis. Superimposed ground-glass and airspace opacities with secondary pulmonary lobular interstitial accentuation and scattered ground-glass opacities, favoring acute pulmonary edema and less likely to be due to atypical infectious process. Small bilateral pleural effusions. Upper abdomen: There is some reflux of contrast medium into the hepatic veins. Several small hypodense lesions in the lateral segment left hepatic lobe,, similar to prior exam. Musculoskeletal: 6 mm of anterolisthesis at C7-T1, not changed from 08/31/2013. 5-10% superior endplate compression fracture at the T5 vertebral level, image 93 series 10, new compared to the CT scan from 08/31/2013. A all Review of the MIP images confirms  the above findings. IMPRESSION: 1. No filling defect is identified in the pulmonary arterial tree to suggest pulmonary embolus. 2. Moderate cardiomegaly with acute pulmonary edema superimposed on chronic peripheral fibrosis/usual interstitial pneumonia. 3. Left ventricular hypertrophy. 4. Scattered mild mediastinal adenopathy, likely due to passive congestion. 5. 5-10% superior endplate compression fracture at the T5 vertebral level is new compared to 08/31/13 and probably subacute. 6. Small bilateral pleural effusions. 7. Several small hypodense lesions in the lateral segment left hepatic lobe, similar to prior CT chest. Electronically Signed   By: Gaylyn Rong M.D.   On: 11/06/2014 17:20   Dg Chest Port 1 View  11/11/2014  CLINICAL DATA:  Acute respiratory failure, hypertension, former smoker, pulmonary fibrosis, atrial fibrillation, chronic diastolic CHF EXAM: PORTABLE CHEST 1 VIEW COMPARISON:  Portable exam 0443 hours compared to 11/09/2014 FINDINGS: Tip of endotracheal tube projects 4.3 cm above carina. Nasogastric tube extends into stomach. RIGHT jugular central venous catheter tip  projects over cavoatrial junction. Enlargement of cardiac silhouette with pulmonary vascular congestion. Persistent severe diffuse interstitial and airspace infiltrates, unchanged. No definite pleural effusion or pneumothorax. IMPRESSION: Persistent severe diffuse BILATERAL pulmonary infiltrates. This may represent pulmonary fibrosis with superimposed edema or infection. Enlargement of cardiac silhouette with pulmonary vascular congestion. Electronically Signed   By: Ulyses Southward M.D.   On: 11/11/2014 07:42   Dg Chest Port 1 View  11/09/2014  CLINICAL DATA:  Followup respiratory distress/ pulmonary edema. EXAM: PORTABLE CHEST 1 VIEW COMPARISON:  11/09/2014 at 0507 hours FINDINGS: The endotracheal to is 4 cm above the carina. The right IJ catheter is stable. The NG tube is stable. Persistent diffuse interstitial and  airspace process in the lungs. No definite pleural effusions. IMPRESSION: Stable support apparatus. Persistent diffuse interstitial and airspace process. Electronically Signed   By: Rudie Meyer M.D.   On: 11/09/2014 12:30   Dg Chest Port 1 View  11/09/2014  CLINICAL DATA:  Respiratory failure EXAM: PORTABLE CHEST 1 VIEW COMPARISON:  11/07/2014 chest radiograph FINDINGS: Endotracheal tube tip is 4.7 cm above the carina. Right internal jugular central venous catheter terminates in the lower third of the superior vena cava. Enteric tube terminates in the proximal stomach Stable cardiomediastinal silhouette with mild cardiomegaly. No pneumothorax. No pleural effusion. There is severe hazy and distorted reticular opacities throughout both lungs, not appreciably changed. IMPRESSION: 1. Well-positioned lines and tubes. 2. Stable severe hazy and reticular opacities throughout both lungs, in keeping with underlying advanced interstitial lung disease, cannot exclude superimposed pulmonary edema given the mild cardiomegaly and hazy opacities. Electronically Signed   By: Delbert Phenix M.D.   On: 11/09/2014 07:13   Portable Chest Xray  11/07/2014  CLINICAL DATA:  Endotracheal tube placement, central line placement EXAM: PORTABLE CHEST 1 VIEW COMPARISON:  11/06/2014 FINDINGS: Borderline cardiomegaly. NG tube in place. Endotracheal tube in place with tip 3.9 cm above the carina. No pneumothorax. Right IJ central line with tip in SVC right atrium junction. Again noted patchy interstitial prominence bilateral probable pulmonary edema superimposed on chronic interstitial lung disease. IMPRESSION: NG tube in place. Endotracheal tube in place. Right IJ central line with tip in SVC right atrium junction. No pneumothorax. Again noted patchy bilateral interstitial prominence. Electronically Signed   By: Natasha Mead M.D.   On: 11/07/2014 18:05   Dg Abd Portable 1v  11/07/2014  CLINICAL DATA:  OG tube placement. EXAM: PORTABLE  ABDOMEN - 1 VIEW COMPARISON:  None. FINDINGS: Nasogastric terminates in the stomach. Bowel gas pattern is unremarkable. Incidental imaging of the lower chest shows coarsening of the pulmonary markings. IMPRESSION: Nasogastric tube terminates in the stomach. Electronically Signed   By: Leanna Battles M.D.   On: 11/07/2014 18:03    ASSESSMENT AND PLAN  JAHEED ADEM is a 80 y.o. male with a history of pulmonary fibrosis, chronic systolic/distolic CHF, chronic atrial fibrillation on Xarelto, OSA, HTN, HLD, obesity and RA who was admitted on 11/07/2014 with acute hypoxemic respiratory failure. He was found to have a positive Troponin and runs of NSVT on telemetry and cardiology consulted.   1. + Troponin:flat trend 0.33->0.34-->0.31.Felt to be due to strain. Symptoms are not c/w ACS. He is a poor candidate for interventional procedures. No plans for stress testing at this point.   2. Atrial fib: rate has been in the 80s-low 100s. Continue Digoxin 0.125mg  qd, dilt 60mg  po TID and Xarelto 15mg  (renally adjusted).  3. Pulmonary fibrosis: Suspect this is the primary cause of his  hypoxemia and pulmonary HTN. CCM following. On vent currently. Planning to extubate today.  4. Moderate pulmonary Hypertension: Pa pk pressure 50 mm Hg  5. Chronic systolic/diastolic CHF: Has an EF of 45% as of echo in Feb. 2016. Repeat echo with EF 45-50% with mild MR. -- Continue IV lasix 40mg  BID. Net neg 6.3 L. Weight down 12 lbs (202--> 190 lbs). His lower ext edema is improving  6. HTN:  Now much better controlled.    7. NSVT: patient noted to have several runs of NSVT possible a fib with aberrancy lasting 3- 20 beats. K+4.1 and Mg+. 2.6 HR currently in the 80s-low 100s. -- He is a poor candidate for BB and amiodarone therapy given his pulmonary fibrosis.  8. Acute resp failure on vent and sedated. CCM following. Plan to extubate today.    Billy Fischer PA-C  Pager  [phone]  Intubated plan per CCM  Afib rate control ok Continue lasix for volume and low dose xarelto.   No amiodarone for NSVT due to ILD.    Charlton Haws

## 2014-11-12 NOTE — Progress Notes (Signed)
Date: November 12, 2014 Chart reviewed for concurrent status and case management needs. Will continue to follow patient for changes and needs: Remains on the vent full support/c.diff positive. Marcelle Smiling, RN, BSN, [place]   [phone]

## 2014-11-13 DIAGNOSIS — I484 Atypical atrial flutter: Secondary | ICD-10-CM

## 2014-11-13 DIAGNOSIS — Z7901 Long term (current) use of anticoagulants: Secondary | ICD-10-CM

## 2014-11-13 LAB — GLUCOSE, CAPILLARY
GLUCOSE-CAPILLARY: 159 mg/dL — AB (ref 65–99)
GLUCOSE-CAPILLARY: 181 mg/dL — AB (ref 65–99)
Glucose-Capillary: 145 mg/dL — ABNORMAL HIGH (ref 65–99)
Glucose-Capillary: 144 mg/dL — ABNORMAL HIGH (ref 65–99)
Glucose-Capillary: 142 mg/dL — ABNORMAL HIGH (ref 65–99)
Glucose-Capillary: 142 mg/dL — ABNORMAL HIGH (ref 65–99)

## 2014-11-13 LAB — CULTURE, RESPIRATORY W GRAM STAIN: Gram Stain: NONE SEEN

## 2014-11-13 LAB — CBC
HEMATOCRIT: 40.2 % (ref 39.0–52.0)
HEMOGLOBIN: 12.7 g/dL — AB (ref 13.0–17.0)
MCH: 32.3 pg (ref 26.0–34.0)
MCHC: 31.6 g/dL (ref 30.0–36.0)
MCV: 102.3 fL — ABNORMAL HIGH (ref 78.0–100.0)
Platelets: 211 10*3/uL (ref 150–400)
RBC: 3.93 MIL/uL — AB (ref 4.22–5.81)
RDW: 17.4 % — ABNORMAL HIGH (ref 11.5–15.5)
WBC: 14.5 10*3/uL — AB (ref 4.0–10.5)

## 2014-11-13 LAB — MAGNESIUM: MAGNESIUM: 2.5 mg/dL — AB (ref 1.7–2.4)

## 2014-11-13 LAB — BASIC METABOLIC PANEL
ANION GAP: 7 (ref 5–15)
BUN: 75 mg/dL — ABNORMAL HIGH (ref 6–20)
CHLORIDE: 110 mmol/L (ref 101–111)
CO2: 34 mmol/L — AB (ref 22–32)
CREATININE: 1.51 mg/dL — AB (ref 0.61–1.24)
Calcium: 8.3 mg/dL — ABNORMAL LOW (ref 8.9–10.3)
GFR calc non Af Amer: 42 mL/min — ABNORMAL LOW (ref >60)
GFR, EST AFRICAN AMERICAN: 48 mL/min — AB (ref >60)
Glucose, Bld: 140 mg/dL — ABNORMAL HIGH (ref 65–99)
POTASSIUM: 3.9 mmol/L (ref 3.5–5.1)
SODIUM: 151 mmol/L — AB (ref 135–145)

## 2014-11-13 LAB — CULTURE, RESPIRATORY

## 2014-11-13 LAB — PHOSPHORUS: PHOSPHORUS: 3.4 mg/dL (ref 2.5–4.6)

## 2014-11-13 MED ORDER — MORPHINE SULFATE (PF) 2 MG/ML IV SOLN
INTRAVENOUS | Status: AC
  Filled 2014-11-13: qty 2

## 2014-11-13 MED ORDER — DIGOXIN 0.25 MG/ML IJ SOLN
0.1250 mg | Freq: Every day | INTRAMUSCULAR | Status: DC
  Administered 2014-11-14 – 2014-11-16 (×3): 0.125 mg via INTRAVENOUS
  Filled 2014-11-13 (×3): qty 0.5

## 2014-11-13 MED ORDER — FREE WATER
300.0000 mL | Status: DC
  Administered 2014-11-13: 300 mL

## 2014-11-13 MED ORDER — MORPHINE SULFATE (PF) 2 MG/ML IV SOLN
2.0000 mg | INTRAVENOUS | Status: DC | PRN
  Administered 2014-11-13: 2 mg via INTRAVENOUS
  Administered 2014-11-14: 4 mg via INTRAVENOUS
  Administered 2014-11-14: 2 mg via INTRAVENOUS
  Administered 2014-11-15: 4 mg via INTRAVENOUS
  Administered 2014-11-15 (×2): 2 mg via INTRAVENOUS
  Administered 2014-11-15 – 2014-11-16 (×3): 4 mg via INTRAVENOUS
  Administered 2014-11-16 (×2): 2 mg via INTRAVENOUS
  Administered 2014-11-16 – 2014-11-17 (×4): 4 mg via INTRAVENOUS
  Filled 2014-11-13: qty 2
  Filled 2014-11-13: qty 1
  Filled 2014-11-13 (×2): qty 2
  Filled 2014-11-13 (×3): qty 1
  Filled 2014-11-13: qty 2
  Filled 2014-11-13: qty 1
  Filled 2014-11-13 (×2): qty 2
  Filled 2014-11-13 (×2): qty 1
  Filled 2014-11-13 (×2): qty 2

## 2014-11-13 NOTE — Progress Notes (Addendum)
PULMONARY / CRITICAL CARE MEDICINE   Name: [name] MRN: [id number] DOB: 08-03-1931    ADMISSION DATE:  11/06/2014 CONSULTATION DATE:  11/07/2014   REFERRING MD :  Roda Shutters  CHIEF COMPLAINT:  Shortness of breath  INITIAL PRESENTATION: 81 year old male with a past medical history significant for pulmonary fibrosis was admitted on 11/07/2014 with acute hypoxemic respiratory failure in the setting of increasing leg swelling. He has known ischemic cardiomyopathy.  STUDIES:  11/06/2014 CT angiogram chest no pulmonary embolism, there is diffuse bilateral airspace disease with small pleural effusions consistent with pulmonary edema on top of a background of significant pulmonary fibrosis. 11/07/2014 echocardiogram> LVEF 45-50%, mild left atrium dilation, RV moderately dilated, PAS 45mm Hg  SIGNIFICANT EVENTS: 10/19 worsening hypoxemia, required intubation   SUBJECTIVE: Doing well on weaning trial today.  VITAL SIGNS: Temp:  [98.1 F (36.7 C)-99.1 F (37.3 C)] 98.4 F (36.9 C) (10/25 0900) Pulse Rate:  [46-97] 92 (10/25 0900) Resp:  [14-21] 18 (10/25 0900) BP: (72-133)/(42-94) 107/46 mmHg (10/25 0800) SpO2:  [90 %-100 %] 90 % (10/25 0900) FiO2 (%):  [30 %] 30 % (10/25 0852) Weight:  [187 lb 13.3 oz (85.2 kg)] 187 lb 13.3 oz (85.2 kg) (10/25 0400)   HEMODYNAMICS:     VENTILATOR SETTINGS: Vent Mode:  [-] PRVC FiO2 (%):  [30 %] 30 % Set Rate:  [18 bmp] 18 bmp Vt Set:  [550 mL] 550 mL PEEP:  [5 cmH20] 5 cmH20 Pressure Support:  [5 cmH20] 5 cmH20 Plateau Pressure:  [23 cmH20-24 cmH20] 24 cmH20   INTAKE / OUTPUT:  Intake/Output Summary (Last 24 hours) at 11/13/14 0955 Last data filed at 11/13/14 0600  Gross per 24 hour  Intake 2335.83 ml  Output   2590 ml  Net -254.17 ml    PHYSICAL EXAMINATION: General:  Elderly male. No distress Neuro:  Sedated on vent HEENT:  ETT in place. Cardiovascular:  Irregularly irregular, No MRG Lungs:  Clear anterior, Basal  crackles. Abdomen:  Soft, + BS Skin:  Mild skin bruising, significant edema in legs bilaterally  LABS:  CBC  Recent Labs Lab 11/11/14 0455 11/12/14 0530 11/13/14 0430  WBC 13.5* 16.9* 14.5*  HGB 12.1* 13.0 12.7*  HCT 38.0* 41.8 40.2  PLT 221 231 211   Coag's No results for input(s): APTT, INR in the last 168 hours.   BMET  Recent Labs Lab 11/11/14 0455 11/12/14 0530 11/13/14 0430  NA 147* 150* 151*  K 4.2 4.1 3.9  CL 108 109 110  CO2 32 33* 34*  BUN 76* 71* 75*  CREATININE 1.42* 1.47* 1.51*  GLUCOSE 144* 124* 140*   Electrolytes  Recent Labs Lab 11/11/14 0455 11/12/14 0530 11/13/14 0430  CALCIUM 8.6* 8.4* 8.3*  MG 2.8* 2.6* 2.5*  PHOS 3.0 3.3 3.4   Sepsis Markers  Recent Labs Lab 11/06/14 1534  LATICACIDVEN 1.21   ABG  Recent Labs Lab 11/07/14 1700 11/08/14 1158 11/09/14 1130  PHART 7.348* 7.429 7.398  PCO2ART 52.6* 41.4 46.7*  PO2ART 239* 49.8* 115*   Liver Enzymes  Recent Labs Lab 11/06/14 1526  AST 27  ALT 37  ALKPHOS 80  BILITOT 1.3*  ALBUMIN 3.5   Cardiac Enzymes  Recent Labs Lab 11/07/14 1030 11/09/14 0930 11/09/14 1525  TROPONINI 0.29* 0.43* 0.44*   Glucose  Recent Labs Lab 11/12/14 0817 11/12/14 1158 11/12/14 1608 11/12/14 2333 11/13/14 0344 11/13/14 0801  GLUCAP 139* 181* 159* 159* 145* 144*    Imaging 10/24 CXR > Stable bilateral opacities,  trace pleural effusions.  ASSESSMENT / PLAN:  PULMONARY A:  Acute respiratory failure with hypoxemia in the setting of acute pulmonary edema with a background of idiopathic pulmonary fibrosis. Required intubation yesterday; doubt IPF flare or HCAP.  Hoping for a quick response to diuretic medications given his prior episodes of the same.  P:   Continue full vent support with 8cc/kg TVol, 8 PEEP Plan on one way extubation after family gets here.  Low dose solumedrol to continue Continue lasix 40 bid.  CARDIOVASCULAR A:  Chest Pain - reported 10/21 Acute  decompensated heart failure, systolic A. fib with RVR > improved 10/20 Frequent ectopy on telemetry monitoring P:  Afib management per cardiology  Continue digoxin Xarelto adjusted for renal clearance.  RENAL A:   Mild hypokalemia in the setting of diuresis AKI  P:   Monitor BMET and UOP Replace electrolytes as needed  GASTROINTESTINAL A:   No acute issues P:   TF per nutrition  Protonix + Famotidine for stress ulcer prophylaxis, dose adjusted (home meds) NPO  Free water for hypernatremia. Increase to   HEMATOLOGIC A:   No acute issues P:  Monitor for bleeding  INFECTIOUS A:   Possible healthcare associated pneumonia C diff P:   BCx2 10/18 >>   10/18 vancomycin >> 10/21 10/18 Ceftazidime >> 10/24 10/23 PO vanco >>  Continue PO vanco for C diff.  Monitor fever curve / leukocytosis   ENDOCRINE A:   Hyperglycemia P:   SSI Q4  NEUROLOGIC A:   Severe anxiety P:   Ativan 0.5 mg BID + 0.5-1mg  Q4 PRN Continue zoloft, seroquel  FAMILY  - Updates:He will likely undergo one way extubation to HFNC today, with comfort if he deteriorates.  - Inter-disciplinary family meet or Palliative Care meeting due by:   11/13/2014   CODE STATUS: Currently full code.   Critical care time- 40 mins  Chilton Greathouse MD [facility] Pager [phone] If no answer or after 3pm call: [phone] 11/13/2014, 9:55 AM

## 2014-11-13 NOTE — Progress Notes (Signed)
180 cc of fentanyl wasted in sink with Redgie Grayer, RN.

## 2014-11-13 NOTE — Procedures (Signed)
Extubation Procedure Note  Patient Details:   Name: [name] DOB: [birth year] MRN: [id number]   Airway Documentation:     Evaluation  O2 sats: 92 Complications:none Patient tolerated procedure well. Bilateral Breath Sounds: Clear, Diminished Suctioning: Oral, Airway Pt not speaking at this time  Per CCM order pt extubated and placed on high flow nasal cannula.  Revonda Humphrey 11/13/2014, 1:01 PM

## 2014-11-13 NOTE — Progress Notes (Signed)
CSW continues following this pt from [facility]. PN reviewed. Pt extubated today. CSW will continue to follow to assist with d/c planning as needed.  Cori Razor LCSW [phone]

## 2014-11-13 NOTE — Progress Notes (Signed)
Patient Name: [name] Date of Encounter: 11/13/2014  Principal Problem:   Acute on chronic systolic CHF (congestive heart failure) (HCC) Active Problems:   Essential hypertension   Atrial flutter (HCC)   IPF (idiopathic pulmonary fibrosis) (HCC)   Acute respiratory failure with hypoxia (HCC)   Acute CHF (congestive heart failure) (HCC)   Respiratory failure (HCC)   Acute congestive heart failure (HCC)   Acute respiratory failure with hypoxemia (HCC)   Chronic anticoagulation   Elevated troponin   Primary Cardiologist: Dr. [place] Patient Profile: 81 y.o. male w/ PMH of atrial fib, pulmonary fibrosis, chronic diastolic CHF, HTN, and OSA who was admitted on 11/06/2014 for evaluation of worsening dyspnea.  SUBJECTIVE: Intubated. Difficult to arouse. Unable to respond to questions.  OBJECTIVE Filed Vitals:   11/13/14 0400 11/13/14 0430 11/13/14 0500 11/13/14 0600  BP: 133/94  82/65 96/53  Pulse: 71 80 60 62  Temp: 98.2 F (36.8 C)  98.2 F (36.8 C) 98.2 F (36.8 C)  TempSrc: Core (Comment)     Resp: 18 18 18 18   Height:      Weight: 187 lb 13.3 oz (85.2 kg)     SpO2: 98% 96% 96% 95%    Intake/Output Summary (Last 24 hours) at 11/13/14 0725 Last data filed at 11/13/14 0600  Gross per 24 hour  Intake 2567.3 ml  Output   3390 ml  Net -822.7 ml   Filed Weights   11/11/14 0500 11/12/14 0500 11/13/14 0400  Weight: 198 lb 13.7 oz (90.2 kg) 190 lb 4.1 oz (86.3 kg) 187 lb 13.3 oz (85.2 kg)    PHYSICAL EXAM General: Well developed, well nourished, male. Currently intubated. Head: Normocephalic, atraumatic.  Neck: Supple without bruits, JVD not elevated. Lungs:  Resp regular and unlabored, CTA anteriorly. Heart: Irregularly irregular, S1, S2, no S3, S4, or murmur; no rub. Abdomen: Soft, non-tender, non-distended with normoactive bowel sounds. No hepatomegaly. No rebound/guarding. No obvious abdominal masses. Extremities: No clubbing, no cyanosis, trace edema.  Distal pedal pulses are 2+ bilaterally. Neuro: Alert and oriented X 3. Moves all extremities spontaneously. Psych: Normal affect.  LABS: CBC: Recent Labs  11/12/14 0530 11/13/14 0430  WBC 16.9* 14.5*  HGB 13.0 12.7*  HCT 41.8 40.2  MCV 102.5* 102.3*  PLT 231 211   Basic Metabolic Panel: Recent Labs  11/12/14 0530 11/13/14 0430  NA 150* 151*  K 4.1 3.9  CL 109 110  CO2 33* 34*  GLUCOSE 124* 140*  BUN 71* 75*  CREATININE 1.47* 1.51*  CALCIUM 8.4* 8.3*  MG 2.6* 2.5*  PHOS 3.3 3.4   BNP:  B NATRIURETIC PEPTIDE  Date/Time Value Ref Range Status  11/06/2014 03:26 PM 240.2* 0.0 - 100.0 pg/mL Final  10/08/2014 07:37 PM 206.1* 0.0 - 100.0 pg/mL Final   TELE:Atrial fibrillation with rate mostly in 60's - 70's. Peak in 110's. Several occurrences of NSVT for 4 beats up to 12 beats.  ECHO:11/07/2014 Study Conclusions - Left ventricle: The cavity size was normal. There was severe concentric hypertrophy. Systolic function was mildly reduced. The estimated ejection fraction was in the range of 45% to 50%. Diffuse hypokinesis with no identifiable regional variations. Regional wall motion abnormalities cannot be excluded. - Mitral valve: There was mild to moderate regurgitation. - Left atrium: The atrium was mildly dilated. - Right ventricle: The cavity size was moderately dilated. Wall thickness was normal. - Pulmonary arteries: Systolic pressure was moderately increased. PA peak pressure: 50 mm Hg (S).  Radiology/Studies:  Dg Chest Port 1 View: 11/12/2014  CLINICAL DATA:  Acute respiratory failure EXAM: PORTABLE CHEST 1 VIEW COMPARISON:  11/11/2014 FINDINGS: There is an endotracheal tube with the tip 2.9 cm above the carina. There is a right IJ central venous catheter with the tip projecting over the cavoatrial junction. There is a nasogastric tube coursing below the diaphragm. There are bilateral trace pleural effusions. There is bilateral interstitial and alveolar  airspace opacity. The heart mediastinum are stable. The osseous structures are unremarkable. IMPRESSION: 1. Bilateral interstitial and alveolar airspace opacities with trace bilateral pleural effusions which may reflect pulmonary edema versus multilobar pneumonia versus ARDS. Electronically Signed   By: Elige Ko   On: 11/12/2014 10:03   Current Medications:  . antiseptic oral rinse  7 mL Mouth Rinse QID  . busPIRone  7.5 mg Oral TID  . chlorhexidine gluconate  15 mL Mouth Rinse BID  . digoxin  0.125 mg Oral Daily  . diltiazem  60 mg Oral 3 times per day  . famotidine (PEPCID) IV  20 mg Intravenous Q24H  . feeding supplement (PRO-STAT SUGAR FREE 64)  30 mL Per Tube BID  . free water  300 mL Per Tube Q6H  . furosemide  40 mg Intravenous BID  . gabapentin  300 mg Oral TID  . insulin aspart  0-9 Units Subcutaneous 6 times per day  . LORazepam  0.5 mg Intravenous BID  . methylPREDNISolone (SOLU-MEDROL) injection  40 mg Intravenous Daily  . pantoprazole (PROTONIX) IV  40 mg Intravenous Q24H  . QUEtiapine  25 mg Oral BID  . rivaroxaban  15 mg Oral Daily  . sertraline  50 mg Oral Daily  . sodium chloride  3 mL Intravenous Q12H  . vancomycin  125 mg Oral 4 times per day   . feeding supplement (VITAL AF 1.2 CAL) 1,000 mL (11/13/14 0436)  . fentaNYL infusion INTRAVENOUS 75 mcg/hr (11/12/14 2200)    ASSESSMENT AND PLAN:  1. Elevated Troponin: - flat trend 0.33->0.34-->0.31.Felt to be due to strain.  - Symptoms are not c/w ACS.  - He is a poor candidate for interventional procedures. No plans for stress testing at this point.   2. Atrial fib:  - rate has been mostly in the 60's - 70's, with peak in 110's.  - Continue Digoxin 0.125mg  qd, dilt 60mg  po TID and Xarelto 15mg  (renally adjusted).  3. Pulmonary fibrosis:  - Suspect this is the primary cause of his hypoxemia and pulmonary HTN. - CCM following. - Wake up assessment planned for today and possible extubation.  4.  Moderate pulmonary Hypertension:  - PA Peak pressure 50 mm Hg on recent Echo  5. Chronic systolic/diastolic CHF:  - Has an EF of 59% as of echo in Feb. 2016. Repeat echo with EF 45-50% with mild MR. - Net neg 6.4 L. Weight down 15 lbs (202--> 187 lbs). His lower ext edema is improving. - Continue IV lasix 40mg  BID.   6. HTN:  - Resolved. - BP has been 72/42 - 133/94 in the past 24 hours.  7. NSVT:  - patient noted to have several runs of NSVT possible a fib with aberrancy lasting 4- 12 beats. K+3.9 and Mg+. 2.5  - He is a poor candidate for BB and amiodarone therapy given his pulmonary fibrosis.  8. Acute resp failure  - on vent and sedated. CCM following. Possible extubation today.  Signed, Ellsworth Lennox , PA-C 7:25 AM 11/13/2014 Pager: [phone]  Nothing new to add.  Intubated afib rates are fine Telemetry review suggests abberrancy with afib K/Mg are fine On renally adjusted xarelto for stroke prevention   Will sign off  [employer]

## 2014-11-13 NOTE — Progress Notes (Signed)
Date: November 13, 2014 Chart reviewed for concurrent status and case management needs. Will continue to follow patient for changes and needs: Remain on vent poss one way extubation today [phone]. Marcelle Smiling, RN, BSN, [place]   [phone]

## 2014-11-14 LAB — GLUCOSE, CAPILLARY
GLUCOSE-CAPILLARY: 98 mg/dL (ref 65–99)
GLUCOSE-CAPILLARY: 106 mg/dL — AB (ref 65–99)
GLUCOSE-CAPILLARY: 96 mg/dL (ref 65–99)
Glucose-Capillary: 109 mg/dL — ABNORMAL HIGH (ref 65–99)
Glucose-Capillary: 116 mg/dL — ABNORMAL HIGH (ref 65–99)
Glucose-Capillary: 121 mg/dL — ABNORMAL HIGH (ref 65–99)
Glucose-Capillary: 137 mg/dL — ABNORMAL HIGH (ref 65–99)
Glucose-Capillary: 143 mg/dL — ABNORMAL HIGH (ref 65–99)
Glucose-Capillary: 98 mg/dL (ref 65–99)

## 2014-11-14 MED ORDER — QUETIAPINE FUMARATE 25 MG PO TABS
25.0000 mg | ORAL_TABLET | Freq: Two times a day (BID) | ORAL | Status: DC
  Administered 2014-11-14: 25 mg via ORAL
  Filled 2014-11-14 (×4): qty 1

## 2014-11-14 MED ORDER — HALOPERIDOL LACTATE 5 MG/ML IJ SOLN: 1.0000 mg | Freq: Four times a day (QID) | INTRAMUSCULAR | Status: DC | PRN

## 2014-11-14 MED ORDER — DEXTROSE 5 % IV SOLN
INTRAVENOUS | Status: DC
  Administered 2014-11-14: 15:00:00 via INTRAVENOUS

## 2014-11-14 MED ORDER — FUROSEMIDE 10 MG/ML IJ SOLN
40.0000 mg | Freq: Two times a day (BID) | INTRAMUSCULAR | Status: DC
  Administered 2014-11-14 – 2014-11-17 (×6): 40 mg via INTRAVENOUS
  Filled 2014-11-14 (×9): qty 4

## 2014-11-14 MED ORDER — SODIUM CHLORIDE 0.9 % IJ SOLN
10.0000 mL | INTRAMUSCULAR | Status: DC | PRN
  Administered 2014-11-14 – 2014-11-17 (×7): 10 mL
  Filled 2014-11-14 (×7): qty 40

## 2014-11-14 NOTE — Progress Notes (Signed)
The patients respirastory status suddenly declined. His work of breathing rapidly increased, while his heart rate also increased up to 140-150's. IV Morphine given for pain and  to ease his resp workload. RT called to examine high flo 02 machine. Pts family called to inform them of his change. Will continue to monitor.

## 2014-11-14 NOTE — Progress Notes (Signed)
Patient transferred to floor on NRB. Patient placed back on HFNC once in the room at 10L and 50%. Vitals checked 91% and HR 52. No distress noted. RN checking for pulse ox for patient.

## 2014-11-14 NOTE — Progress Notes (Signed)
Nutrition Follow-up  DOCUMENTATION CODES:   Obesity unspecified  INTERVENTION:  - Diet advancement as medically feasible/per SLP recommendations - RD will continue to monitor for needs  NUTRITION DIAGNOSIS:   Inadequate oral intake related to inability to eat as evidenced by NPO status. -ongoing  GOAL:   Patient will meet greater than or equal to 90% of their needs -unmet  MONITOR:   Diet advancement, Weight trends, Labs, Skin, I & O's  ASSESSMENT:   81 y.o. male with a past medical history of systolic congestive heart failure having his last transthoracic echocardiogram performed on 02/21/2014 that showed ejection fraction 40-45%, history of atrial fibrillation/atrial flutter on chronic anticoagulation with Xarelto, pulmonary fibrosis having previous hospitalizations for acute decompensated congestive heart failure presented to the emergency department with complaints of shortness of breath associated with worsening bilateral extremity edema becoming progressively worse over the past several days. He complains of associated chest tightness, orthopnea, having a functional decline requiring greater assistance with activities of daily living. Lab work in the emergency room included a troponin that was elevated at 0.37. EKG showed atrial fibrillation with left anterior fascicular block. He had a CT scan of chest with IV contrast in the emergency department that was negative for pulmonary embolism however radiology reported acute pulmonary edema.   10/26 Pt was extubated 10/25 @ ~1300 and OGT removed at that time. He was previously receiving Vital 1.2 @ 50 mL/hr with 30 mL Prostat once/day. Pt remains NPO. Pt sleeping with bilateral mitten restraints in place and no family or visitors present at this time.  Needs re-estimated based on extubation. Unable to meet needs. Medications reviewed. Labs reviewed; CBGs: 96-181  mg/dL, Na: 732 mmol/L, BUN/creatinine elevated, Ca: 8.3 mg/dL, Mg: 2.5 mg/dL, GFR: 42.   20/25 - Patient is currently intubated on ventilator support with OGT in place. - MV: 9.5 L/min; Propofol: none - No family or visitors present at time of visit to provide PTA information. - Per chart review, pt has lost 11 lbs (5% body weight) in the past 10 months which is not significant for time frame. - No muscle or fat wasting noted, mild BLE edema noted.   Diet Order:  Diet NPO time specified  Skin:  Wound (see comment) (R hip incision (11/06/14))  Last BM:  10/26  Height:   Ht Readings from Last 1 Encounters:  11/06/14 5\' 8"  (1.727 m)    Weight:   Wt Readings from Last 1 Encounters:  11/13/14 187 lb 13.3 oz (85.2 kg)    Ideal Body Weight:  70 kg (kg)  BMI:  Body mass index is 28.57 kg/(m^2).  Estimated Nutritional Needs:   Kcal:  1700-1900  Protein:  70-80 grams  Fluid:  1.8-2 L/day  EDUCATION NEEDS:   No education needs identified at this time     11/15/14, RD, LDN Inpatient Clinical Dietitian Pager # [phone] After hours/weekend pager # [phone]

## 2014-11-14 NOTE — Progress Notes (Signed)
eLink Physician-Brief Progress Note Patient Name: [name] DOB: February 20, 1931 MRN: [id number]   Date of Service  11/14/2014  HPI/Events of Note  E ICU rounds Chart reviewed, I am familiar with this case Vitals stable Breathing comfortably Plan to move towards comfort if his breathing worsens We need ICU beds  eICU Interventions  Transfer to med-surg     Intervention Category Minor Interventions: Routine modifications to care plan (e.g. PRN medications for pain, fever)  Max Fickle 11/14/2014, 12:10 AM

## 2014-11-14 NOTE — Progress Notes (Signed)
PULMONARY / CRITICAL CARE MEDICINE   Name: [name] MRN: [id number] DOB: 1931/06/20    ADMISSION DATE:  11/06/2014 CONSULTATION DATE:  11/07/2014   REFERRING MD :  Roda Shutters  CHIEF COMPLAINT:  Shortness of breath  INITIAL PRESENTATION: 81 year old male with a past medical history significant for pulmonary fibrosis was admitted on 11/07/2014 with acute hypoxemic respiratory failure in the setting of increasing leg swelling. He has known ischemic cardiomyopathy.  STUDIES:  11/06/2014 CT angiogram chest no pulmonary embolism, there is diffuse bilateral airspace disease with small pleural effusions consistent with pulmonary edema on top of a background of significant pulmonary fibrosis. 11/07/2014 echocardiogram> LVEF 45-50%, mild left atrium dilation, RV moderately dilated, PAS 79mm Hg  SIGNIFICANT EVENTS: 10/19 worsening hypoxemia, required intubation 10/25 Extubated   SUBJECTIVE: Extubated to HFNC yesterday. Transferred out of ICU.  VITAL SIGNS: Temp:  [98 F (36.7 C)-99.9 F (37.7 C)] 98.4 F (36.9 C) (10/26 0700) Pulse Rate:  [44-115] 95 (10/26 1026) Resp:  [15-30] 18 (10/26 1026) BP: (100-151)/(43-118) 145/84 mmHg (10/26 0700) SpO2:  [91 %-97 %] 91 % (10/26 1026) FiO2 (%):  [30 %-50 %] 50 % (10/26 1026)   HEMODYNAMICS:    VENTILATOR SETTINGS: Vent Mode:  [-] CPAP FiO2 (%):  [30 %-50 %] 50 % Set Rate:  [18 bmp] 18 bmp PEEP:  [5 cmH20] 5 cmH20 Pressure Support:  [5 cmH20] 5 cmH20   INTAKE / OUTPUT:  Intake/Output Summary (Last 24 hours) at 11/14/14 1113 Last data filed at 11/14/14 0700  Gross per 24 hour  Intake 220.42 ml  Output   1775 ml  Net -1554.58 ml    PHYSICAL EXAMINATION: General:  Elderly male. Confused Neuro:  No focal deficits HEENT:  Moist mucus membranes Cardiovascular:  Irregularly irregular, No MRG Lungs:  Clear anterior, Basal crackles. Abdomen:  Soft, + BS Skin:  Mild skin bruising.  LABS:  CBC  Recent Labs Lab 11/11/14 0455  11/12/14 0530 11/13/14 0430  WBC 13.5* 16.9* 14.5*  HGB 12.1* 13.0 12.7*  HCT 38.0* 41.8 40.2  PLT 221 231 211   Coag's No results for input(s): APTT, INR in the last 168 hours.   BMET  Recent Labs Lab 11/11/14 0455 11/12/14 0530 11/13/14 0430  NA 147* 150* 151*  K 4.2 4.1 3.9  CL 108 109 110  CO2 32 33* 34*  BUN 76* 71* 75*  CREATININE 1.42* 1.47* 1.51*  GLUCOSE 144* 124* 140*   Electrolytes  Recent Labs Lab 11/11/14 0455 11/12/14 0530 11/13/14 0430  CALCIUM 8.6* 8.4* 8.3*  MG 2.8* 2.6* 2.5*  PHOS 3.0 3.3 3.4   Sepsis Markers No results for input(s): LATICACIDVEN, PROCALCITON, O2SATVEN in the last 168 hours. ABG  Recent Labs Lab 11/07/14 1700 11/08/14 1158 11/09/14 1130  PHART 7.348* 7.429 7.398  PCO2ART 52.6* 41.4 46.7*  PO2ART 239* 49.8* 115*   Liver Enzymes No results for input(s): AST, ALT, ALKPHOS, BILITOT, ALBUMIN in the last 168 hours. Cardiac Enzymes  Recent Labs Lab 11/09/14 0930 11/09/14 1525  TROPONINI 0.43* 0.44*   Glucose  Recent Labs Lab 11/13/14 1612 11/13/14 1954 11/14/14 0012 11/14/14 0349 11/14/14 0440 11/14/14 0800  GLUCAP 142* 121* 109* 98 106* 96    Imaging 10/24 CXR > Stable bilateral opacities, trace pleural effusions.  ASSESSMENT / PLAN:  PULMONARY A:  Acute respiratory failure with hypoxemia in the setting of acute pulmonary edema with a background of idiopathic pulmonary fibrosis. Required intubation yesterday; doubt IPF flare or HCAP.  Hoping for a quick  response to diuretic medications given his prior episodes of the same.  P:   Continue HFNC Solumedrol to continue Continue lasix 40 IV bid.  CARDIOVASCULAR A:  Chest Pain - reported 10/21 Acute decompensated heart failure, systolic A. fib with RVR > improved 10/20 Frequent ectopy on telemetry monitoring P:  Afib management per cardiology  Continue digoxin Xarelto adjusted for renal clearance.  RENAL A:   Mild hypokalemia in the setting of  diuresis AKI  P:   Monitor BMET and UOP Replace electrolytes as needed  GASTROINTESTINAL A:   No acute issues P:   Protonix + Famotidine for stress ulcer prophylaxis, dose adjusted (home meds) NPO  D5W at 75 cchr for hypernatremia.  HEMATOLOGIC A:   No acute issues P:  Monitor for bleeding  INFECTIOUS A:   Possible healthcare associated pneumonia C diff P:   BCx2 10/18 >>   10/18 vancomycin >> 10/21 10/18 Ceftazidime >> 10/24 10/23 PO vanco >>  Continue PO vanco for C diff.  Monitor fever curve / leukocytosis   ENDOCRINE A:   Hyperglycemia P:   SSI Q4  NEUROLOGIC A:   Severe anxiety P:   Ativan 0.5 mg BID + 0.5-1mg  Q4 PRN Restart seroquel. Zoloft on hold for now.  FAMILY  - Updates: No family at bedside 10/26. They were updated yesterday 10/25. - Inter-disciplinary family meet or Palliative Care meeting due by:   11/13/2014   CODE STATUS: DNR/DNI. If his resp status deteriorates then he will be transitioned to comfort care.  Chilton Greathouse MD [facility] Pager [phone] If no answer or after 3pm call: [phone] 11/14/2014, 11:13 AM

## 2014-11-15 DIAGNOSIS — Z515 Encounter for palliative care: Secondary | ICD-10-CM

## 2014-11-15 DIAGNOSIS — Z7189 Other specified counseling: Secondary | ICD-10-CM

## 2014-11-15 DIAGNOSIS — I483 Typical atrial flutter: Secondary | ICD-10-CM

## 2014-11-15 DIAGNOSIS — I5021 Acute systolic (congestive) heart failure: Secondary | ICD-10-CM

## 2014-11-15 LAB — GLUCOSE, CAPILLARY
GLUCOSE-CAPILLARY: 138 mg/dL — AB (ref 65–99)
Glucose-Capillary: 100 mg/dL — ABNORMAL HIGH (ref 65–99)
Glucose-Capillary: 101 mg/dL — ABNORMAL HIGH (ref 65–99)
Glucose-Capillary: 125 mg/dL — ABNORMAL HIGH (ref 65–99)
Glucose-Capillary: 126 mg/dL — ABNORMAL HIGH (ref 65–99)

## 2014-11-15 MED ORDER — GLYCOPYRROLATE 0.2 MG/ML IJ SOLN
0.2000 mg | INTRAMUSCULAR | Status: DC | PRN
  Filled 2014-11-15: qty 1

## 2014-11-15 MED ORDER — BISACODYL 10 MG RE SUPP: 10.0000 mg | Freq: Every day | RECTAL | Status: DC | PRN

## 2014-11-15 NOTE — Consult Note (Signed)
Consultation Note Date: 11/15/2014   Patient Name: [name]  DOB: [birth year]  MRN: [id number]  Age / Sex: 81 y.o., male   PCP: Rowe Clack, MD Referring Physician: Theodis Blaze, MD  Reason for Consultation: Disposition, Establishing goals of care, Non pain symptom management and Terminal care  Palliative Care Assessment and Plan Summary of Established Goals of Care and Medical Treatment Preferences   Clinical Assessment/Narrative: 81 year old male with a past medical history significant for pulmonary fibrosis and ischemic cardiomyopathy admitted with acute hypoxemic respiratory failure. He was extubated on 10/25. He has continued to decline and now is unresponsive to verbal or tactile stimulation.  I met today with Mr. Cadavid children, De Blanch and [employer].  I asked what is most important to her father and they report he has been unhappy for very long time with his current situation. He is currently living in assisted living facility where he has struggled with his loss of independence. In the past, his independence was very important to him. They also report that he has always been a very religious individual. He finds joy in his family as well, but can be "difficult" at times.  They also report understanding father's current clinical situation and the doctors have been doing a good job explaining things to them. When asked what he would say about this, they report that he would be miserable and would not want to continue to receive disease modifying therapies.  We talked about possible paths moving forward including continuing with his current therapy or refocusing his care to pursue only comfort based interventions. His children are clear that if he'll be able to participate in the conversation his wishes would be to focus only on things that would add to his comfort.  - I met with Mr. Gervasi children today.  They report that he is much different today than  yesterday and is no longer waking to interact with them. - They feel that based upon his recent course and current clinical state that his wishes would be to focus only on interventions focused on his comfort - It appears that with the change from yesterday to today he is likely in the process of actively dying - I called and discussed with Dr. Doyle Askew who is in agreement with plan to refocus his care only on comfort - There is a high likelihood this will be a terminal admission. If he appears to be stable for possible transition, he would best be served by pursuing placement in a residential hospice facility. - I will plan to follow-up with patient tomorrow to assess for symptomatic management - His daughter is traveling back to [place] and I will call her tomorrow to update on his condition  Contacts/Participants in Discussion: Primary Decision Maker: The patient's children, De Blanch and Tommy Buscemi  HCPOA: No   Code Status/Advance Care Planning:  DO NOT RESUSCITATE/DO NOT INTUBATE  Symptom Management:   Dyspnea: He has been tolerating current dose of morphine well. We'll plan to continue same and would titrate up as needed to maintain his comfort.  I discussed at length with family regarding his high flow nasal cannula. As he is currently tolerating it well with no signs of discomfort, we will plan to continue same at this time. If, however, this becomes an issue and seems to be adding discomfort, I advised that it is something that would discontinue at that time. Family is in agreement with this plan.  Anxiety: Continue lorazepam as needed  Agitation: Continue Haldol as needed  Pain: Morphine as needed  Excess secretions: Glycopyrrolate as needed  Constipation: Dulcolax as needed   Psycho-social/Spiritual:   Support System: Strong through family  Desire for further Chaplaincy support:no  Prognosis: Hours - Days most likely. I think his respiratory condition will continue to  decline and will be the cause of his death. If not, he is currently not taking any nutritional support or hydration which would make his overall prognosis less than 2 weeks.  Discharge Planning:  There is high likelihood this will be terminal admission. If his condition stabilizes, he would likely qualify for inpatient hospice.       Chief Complaint/History of Present Illness:  81 year old male with a past medical history significant for pulmonary fibrosis and ischemic cardiomyopathy admitted with acute hypoxemic respiratory failure. He was extubated on 10/25. He has continued to decline and now is unresponsive to verbal or tactile stimulation.    Primary Diagnoses  Present on Admission:  . Acute on chronic systolic CHF (congestive heart failure) ([place]) . Acute respiratory failure with hypoxia ([place]) . Atrial flutter ([place]) . Essential hypertension . IPF (idiopathic pulmonary fibrosis) ([place]) . Respiratory failure ([place])  Palliative Review of Systems: Unable to assess as patient does not arouse I have reviewed the medical record, interviewed the patient and family, and examined the patient. The following aspects are pertinent.  Past Medical History  Diagnosis Date  . Atrial flutter ([place])     a. s/p ablation for RVR  03/2009, Xarelto ongoing  . VENOUS INSUFFICIENCY, LEFT LEG   . PULMONARY FIBROSIS, INTERSTITIAL   . OBSTRUCTIVE SLEEP APNEA   . BENIGN POSITIONAL VERTIGO   . Rheumatoid arthritis(714.0)   . Chronic diastolic CHF (congestive heart failure) ([place])     a. 10/2013 Echo: EF 45-50%, mild TR, mildly dil LA/RA.  [facility] PERIPHERAL NEUROPATHY   . HYPERTENSION   . HYPERLIPIDEMIA   . DEPRESSION     [facility] hosp 01/2011 for SI  . Anxiety   . Morbid obesity ([place])   . Diverticulosis of colon (without mention of hemorrhage)   . Atrial fibrillation ([facility])     a. chronic xarelto;  b. prev on amio->d/c'd 2/2 pulmonary fibrosis.  . Pulmonary fibrosis ([place])    Social History   Social History  .  Marital Status: Divorced    Spouse Name: N/A  . Number of Children: 3  . Years of Education: N/A   Social History Main Topics  . Smoking status: Former Smoker -- 1.00 packs/day for 3 years    Types: Cigarettes    Quit date: 01/20/2000  . Smokeless tobacco: Never Used     Comment: Single, lives alone. 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 His family contact would be his sister Lorenz Coaster [phone]  . Alcohol Use: No     Comment: Former  . Drug Use: No  . Sexual Activity: Not Currently   Other Topics Concern  . None   Social History Narrative   Family History  Problem Relation Age of Onset  . Heart attack Father 3  . Other Mother 37    Old age  . Colon cancer Neg Hx    Scheduled Meds: . chlorhexidine gluconate  15 mL Mouth Rinse BID  . digoxin  0.125 mg Intravenous Daily  . furosemide  40 mg Intravenous Q12H  . sodium chloride  3 mL Intravenous Q12H   Continuous Infusions:  PRN Meds:.sodium chloride, bisacodyl, glycopyrrolate, haloperidol lactate,  levalbuterol, LORazepam, morphine injection, ondansetron (ZOFRAN) IV, sodium chloride, sodium chloride Medications Prior to Admission:  Prior to Admission medications   Medication Sig Start Date End Date Taking? Authorizing Provider  busPIRone (BUSPAR) 5 MG tablet Take 7.5 mg by mouth 3 (three) times daily.    Yes Historical Provider, MD  Cholecalciferol (VITAMIN D) 2000 UNITS CAPS Take 2,000 Units by mouth daily.   Yes Historical Provider, MD  dextromethorphan-guaiFENesin (MUCINEX DM) 30-600 MG per 12 hr tablet Take 1 tablet by mouth every 12 (twelve) hours as needed for cough.   Yes Historical Provider, MD  digoxin (LANOXIN) 0.125 MG tablet Take 1 tablet (0.125 mg total) by mouth daily. 12/01/13  Yes Brett Canales, PA-C  diltiazem (CARDIZEM CD) 300 MG 24 hr capsule Take 1 capsule (300 mg total) by mouth daily. 12/01/13  Yes Brett Canales, PA-C  famotidine (PEPCID) 20 MG tablet One at  bedtime Patient taking differently: Take 20 mg by mouth at bedtime. One at bedtime 05/24/14  Yes Tanda Rockers, MD  furosemide (LASIX) 40 MG tablet Take 40 mg by mouth 2 (two) times daily.  11/10/13  Yes Historical Provider, MD  gabapentin (NEURONTIN) 300 MG capsule Take 1 capsule (300 mg total) by mouth 3 (three) times daily. 09/26/14  Yes Golden Circle, FNP  leflunomide (ARAVA) 20 MG tablet Take 20 mg by mouth daily.  10/08/14  Yes Historical Provider, MD  LORazepam (ATIVAN) 0.5 MG tablet Take 0.5 mg by mouth 2 (two) times daily.   Yes Historical Provider, MD  Melatonin 5 MG CAPS Take 1 capsule by mouth at bedtime.   Yes Historical Provider, MD  memantine (NAMENDA XR) 14 MG CP24 24 hr capsule Take 14 mg by mouth daily.   Yes Historical Provider, MD  Multiple Vitamin (DAILY VITE) TABS Take 1 tablet by mouth daily.   Yes Historical Provider, MD  OXYGEN Inhale into the lungs. 2L at bedtime   Yes Historical Provider, MD  Oyster Shell 500 MG TABS Take 1,000 mg by mouth daily.    Yes Historical Provider, MD  pantoprazole (PROTONIX) 40 MG tablet Take 1 tablet (40 mg total) by mouth daily. Take 30-60 min before first meal of the day 05/24/14  Yes Tanda Rockers, MD  predniSONE (DELTASONE) 10 MG tablet 4 x 2 days, 2 x 2 days, 1 x 2 days, then continue regular dose Patient taking differently: Take 10 mg by mouth daily.  05/24/14  Yes Tanda Rockers, MD  psyllium (REGULOID) 0.52 G capsule Take 0.52 g by mouth at bedtime.   Yes Historical Provider, MD  QUEtiapine (SEROQUEL) 25 MG tablet Take 25 mg by mouth 2 (two) times daily.   Yes Historical Provider, MD  rivaroxaban (XARELTO) 20 MG TABS tablet Take 1 tablet (20 mg total) by mouth daily with supper. Patient taking differently: Take 20 mg by mouth daily.  12/01/13  Yes Brett Canales, PA-C  sertraline (ZOLOFT) 50 MG tablet Take 1 tablet (50 mg total) by mouth daily. 09/26/14  Yes Golden Circle, FNP  Witch Hazel (TUCKS MEDICATED COOLING) 50 % PADS Apply 1 patch  topically. Apply externally to affected area up to 6 times daily or after each bowel movement   Yes Historical Provider, MD  memantine (NAMENDA) 10 MG tablet Take 1 tablet (10 mg total) by mouth daily. Patient not taking: Reported on 10/08/2014 02/12/14   Rowe Clack, MD   Allergies  Allergen Reactions  . Codeine  The patient states he has allergy to codeine  . Methotrexate Swelling    REACTION: ONLY HAS ONE KIDNEY, caused swelling in hands and feet   CBC:    Component Value Date/Time   WBC 14.5* 11/13/2014 0430   HGB 12.7* 11/13/2014 0430   HCT 40.2 11/13/2014 0430   PLT 211 11/13/2014 0430   MCV 102.3* 11/13/2014 0430   NEUTROABS 14.0* 11/09/2014 0430   LYMPHSABS 0.6* 11/09/2014 0430   MONOABS 0.7 11/09/2014 0430   EOSABS 0.0 11/09/2014 0430   BASOSABS 0.0 11/09/2014 0430   Comprehensive Metabolic Panel:    Component Value Date/Time   NA 151* 11/13/2014 0430   K 3.9 11/13/2014 0430   CL 110 11/13/2014 0430   CO2 34* 11/13/2014 0430   BUN 75* 11/13/2014 0430   CREATININE 1.51* 11/13/2014 0430   CREATININE 0.99 01/29/2014 1347   GLUCOSE 140* 11/13/2014 0430   CALCIUM 8.3* 11/13/2014 0430   AST 27 11/06/2014 1526   ALT 37 11/06/2014 1526   ALKPHOS 80 11/06/2014 1526   BILITOT 1.3* 11/06/2014 1526   PROT 6.7 11/06/2014 1526   ALBUMIN 3.5 11/06/2014 1526    Physical Exam: Vital Signs: BP 135/75 mmHg  Pulse 90  Temp(Src) 98.5 F (36.9 C) (Oral)  Resp 22  Ht 5' 8"  (1.727 m)  Wt 85.2 kg (187 lb 13.3 oz)  BMI 28.57 kg/m2  SpO2 94% SpO2: SpO2: 94 % O2 Device: O2 Device: High Flow Nasal Cannula O2 Flow Rate: O2 Flow Rate (L/min): 15 L/min Intake/output summary:  Intake/Output Summary (Last 24 hours) at 11/15/14 1936 Last data filed at 11/15/14 1833  Gross per 24 hour  Intake   1190 ml  Output   2950 ml  Net  -1760 ml   LBM: Last BM Date: 11/15/14 Baseline Weight: Weight: 92 kg (202 lb 13.2 oz) Most recent weight: Weight: 85.2 kg (187 lb 13.3  oz)  Exam Findings:  General: Lying in bed, high flow nasal cannula in place, does not arouse to verbal or tactile stimulation, in no acute distress.  HEENT: No bruits, no goiter, no JVD Heart: Regular rate and rhythm. No murmur appreciated. Peripheral pulses present but diminished. Lungs: Fair air movement. Scattered Rhonchi Abdomen: Soft, nontender, nondistended, positive bowel sounds.  Ext: Some edema Skin: Warm and dry Neuro: Unable to assess.          Palliative Performance Scale: 10               Additional Data Reviewed: Recent Labs     11/13/14  0430  WBC  14.5*  HGB  12.7*  PLT  211  NA  151*  BUN  75*  CREATININE  1.51*     Time In: 1610 Time Out: 1735 Time Total: 85 Greater than 50%  of this time was spent counseling and coordinating care related to the above assessment and plan.  Signed by: Micheline Rough, MD  Micheline Rough, MD  11/15/2014, 7:36 PM  Please contact Palliative Medicine Team phone at [phone] for questions and concerns.

## 2014-11-15 NOTE — Care Management Important Message (Signed)
Important Message  Patient Details  Name: [name] MRN: [id number] Date of Birth: 1931-03-24   Medicare Important Message Given:  Yes-second notification given    Renie Ora 11/15/2014, 2:50 PMImportant Message  Patient Details  Name: [name] MRN: [id number] Date of Birth: 09/09/1931   Medicare Important Message Given:  Yes-second notification given    Renie Ora 11/15/2014, 2:50 PM

## 2014-11-15 NOTE — Progress Notes (Addendum)
Patient ID: MUNEER LEIDER, male   DOB: 1931-05-30, 81 y.o.   MRN: [id number]  [facility] PROGRESS NOTE  TYLAND KLEMENS RXV:[id number] DOB: 1931-07-13 DOA: 11/06/2014 PCP: [name], MD   Brief narrative:    81 year old male with a past medical history significant for pulmonary fibrosis was admitted on 11/07/2014 with acute hypoxemic respiratory failure in the setting of increasing leg swelling. He has known ischemic cardiomyopathy.  STUDIES:  11/06/2014 CT angiogram chest no pulmonary embolism, there is diffuse bilateral airspace disease with small pleural effusions consistent with pulmonary edema on top of a background of significant pulmonary fibrosis. 11/07/2014 echocardiogram> LVEF 45-50%, mild left atrium dilation, RV moderately dilated, PAS 8mm Hg  SIGNIFICANT EVENTS: 10/19 worsening hypoxemia, required intubation 10/25 extubated 10/27 [facility] assumed care   Assessment/Plan:    Acute respiratory failure with hypoxemia in the setting of acute pulmonary edema with hx of idiopathic pulmonary fibrosis - required intubation - has responded well to lasix and was successfully extubated 10/25 - pt remains lethargic and difficult to arouse, unable to take anything PO - d/w PCT, family in agreement with focus on comfort - no further blood work to ensure comfort - depending on how pt doing, may stop IV dig and lasix in next 24 hours, family in agreement   Acute decompensated heart failure, systolic - continue lasix as noted above - weight is 85 kg this AM - focus on comfort as noted above   A. fib with RVR > improved 10/20 - Continue digoxin - stop AC as pt unable to take PO - family in agreement with full comfort   Mild hypokalemia in the setting of diuresis - supplemented and WNL  AKI  - Cr overall trending down   Hypernatremia - has been on D5 but Na is worse - no further blood work to ensure comfort  Possible healthcare associated pneumonia BCx2  10/18 >>  10/18 vancomycin >> 10/21 10/18 Ceftazidime >> 10/24  C. Diff - 10/23 PO vanco >> - pt unable to take anything PO at this time - again, family in agreement with full comfort  Acute metabolic encephalopathy - from FTT, respiratory failure, hypernatremia - still lethargic this AM  - give ativan only as needed  - Restarted Seroquel by PCCM but pt unable to take anything PO  Functional quadriplegia - in the setting of progressive illness   DVT prophylaxis - SCD's  Code Status: DNR Family Communication:  No family at bedside  Disposition Plan: To be determined   IV access:  Peripheral IV  Procedures and diagnostic studies:    Dg Chest 2 View  11/06/2014  CLINICAL DATA:  Short of breath EXAM: CHEST  2 VIEW COMPARISON:  10/08/2014 FINDINGS: Chronic interstitial fibrosis is noted. Progression of bilateral airspace disease compared with prior studies suggestive of superimposed acute process such as edema or bilateral pneumonia. No significant pleural effusion. Cardiac enlargement. IMPRESSION: Chronic interstitial fibrosis. Superimposed acute airspace disease bilaterally may be due to edema or pneumonia bilaterally. Electronically Signed   By: Marlan Palau M.D.   On: 11/06/2014 14:28   Ct Angio Chest Pe W/cm &/or Wo Cm  11/06/2014  CLINICAL DATA:  Dyspnea.  Hypoxia.  COPD.  Hemoptysis. EXAM: CT ANGIOGRAPHY CHEST WITH CONTRAST TECHNIQUE: Multidetector CT imaging of the chest was performed using the standard protocol during bolus administration of intravenous contrast. Multiplanar CT image reconstructions and MIPs were obtained to evaluate the vascular anatomy. CONTRAST:  OMNIPAQUE IOHEXOL 350 MG/ML SOLN COMPARISON:  Multiple exams, including 11/06/2014 hand 08/21/2013. FINDINGS: Mediastinum/Nodes: No filling defect is identified in the pulmonary arterial tree to suggest pulmonary embolus. Coronary, aortic arch, and branch vessel atherosclerotic vascular disease. Moderate  cardiomegaly. Interventricular septum thickness 2.1 cm compatible with left ventricular hypertrophy. Scattered mild mediastinal adenopathy. Index prevascular node 1.2 cm, image 37 series 4. Index right lower paratracheal node 1.7 cm, image 33 series 4. Lungs/Pleura: Chronic peripheral coarse interstitial accentuation compatible with fibrosis. Superimposed ground-glass and airspace opacities with secondary pulmonary lobular interstitial accentuation and scattered ground-glass opacities, favoring acute pulmonary edema and less likely to be due to atypical infectious process. Small bilateral pleural effusions. Upper abdomen: There is some reflux of contrast medium into the hepatic veins. Several small hypodense lesions in the lateral segment left hepatic lobe,, similar to prior exam. Musculoskeletal: 6 mm of anterolisthesis at C7-T1, not changed from 08/31/2013. 5-10% superior endplate compression fracture at the T5 vertebral level, image 93 series 10, new compared to the CT scan from 08/31/2013. A all Review of the MIP images confirms the above findings. IMPRESSION: 1. No filling defect is identified in the pulmonary arterial tree to suggest pulmonary embolus. 2. Moderate cardiomegaly with acute pulmonary edema superimposed on chronic peripheral fibrosis/usual interstitial pneumonia. 3. Left ventricular hypertrophy. 4. Scattered mild mediastinal adenopathy, likely due to passive congestion. 5. 5-10% superior endplate compression fracture at the T5 vertebral level is new compared to 08/31/13 and probably subacute. 6. Small bilateral pleural effusions. 7. Several small hypodense lesions in the lateral segment left hepatic lobe, similar to prior CT chest. Electronically Signed   By: Gaylyn Rong M.D.   On: 11/06/2014 17:20   Dg Chest Port 1 View  11/12/2014  CLINICAL DATA:  Acute respiratory failure EXAM: PORTABLE CHEST 1 VIEW COMPARISON:  11/11/2014 FINDINGS: There is an endotracheal tube with the tip 2.9 cm  above the carina. There is a right IJ central venous catheter with the tip projecting over the cavoatrial junction. There is a nasogastric tube coursing below the diaphragm. There are bilateral trace pleural effusions. There is bilateral interstitial and alveolar airspace opacity. The heart mediastinum are stable. The osseous structures are unremarkable. IMPRESSION: 1. Bilateral interstitial and alveolar airspace opacities with trace bilateral pleural effusions which may reflect pulmonary edema versus multilobar pneumonia versus ARDS. Electronically Signed   By: Elige Ko   On: 11/12/2014 10:03   Dg Chest Port 1 View  11/11/2014  CLINICAL DATA:  Acute respiratory failure, hypertension, former smoker, pulmonary fibrosis, atrial fibrillation, chronic diastolic CHF EXAM: PORTABLE CHEST 1 VIEW COMPARISON:  Portable exam 0443 hours compared to 11/09/2014 FINDINGS: Tip of endotracheal tube projects 4.3 cm above carina. Nasogastric tube extends into stomach. RIGHT jugular central venous catheter tip projects over cavoatrial junction. Enlargement of cardiac silhouette with pulmonary vascular congestion. Persistent severe diffuse interstitial and airspace infiltrates, unchanged. No definite pleural effusion or pneumothorax. IMPRESSION: Persistent severe diffuse BILATERAL pulmonary infiltrates. This may represent pulmonary fibrosis with superimposed edema or infection. Enlargement of cardiac silhouette with pulmonary vascular congestion. Electronically Signed   By: Ulyses Southward M.D.   On: 11/11/2014 07:42   Dg Chest Port 1 View  11/09/2014  CLINICAL DATA:  Followup respiratory distress/ pulmonary edema. EXAM: PORTABLE CHEST 1 VIEW COMPARISON:  11/09/2014 at 0507 hours FINDINGS: The endotracheal to is 4 cm above the carina. The right IJ catheter is stable. The NG tube is stable. Persistent diffuse interstitial and airspace process in the lungs. No definite pleural effusions. IMPRESSION: Stable support apparatus.  Persistent  diffuse interstitial and airspace process. Electronically Signed   By: Rudie Meyer M.D.   On: 11/09/2014 12:30   Dg Chest Port 1 View  11/09/2014  CLINICAL DATA:  Respiratory failure EXAM: PORTABLE CHEST 1 VIEW COMPARISON:  11/07/2014 chest radiograph FINDINGS: Endotracheal tube tip is 4.7 cm above the carina. Right internal jugular central venous catheter terminates in the lower third of the superior vena cava. Enteric tube terminates in the proximal stomach Stable cardiomediastinal silhouette with mild cardiomegaly. No pneumothorax. No pleural effusion. There is severe hazy and distorted reticular opacities throughout both lungs, not appreciably changed. IMPRESSION: 1. Well-positioned lines and tubes. 2. Stable severe hazy and reticular opacities throughout both lungs, in keeping with underlying advanced interstitial lung disease, cannot exclude superimposed pulmonary edema given the mild cardiomegaly and hazy opacities. Electronically Signed   By: Delbert Phenix M.D.   On: 11/09/2014 07:13   Portable Chest Xray  11/07/2014  CLINICAL DATA:  Endotracheal tube placement, central line placement EXAM: PORTABLE CHEST 1 VIEW COMPARISON:  11/06/2014 FINDINGS: Borderline cardiomegaly. NG tube in place. Endotracheal tube in place with tip 3.9 cm above the carina. No pneumothorax. Right IJ central line with tip in SVC right atrium junction. Again noted patchy interstitial prominence bilateral probable pulmonary edema superimposed on chronic interstitial lung disease. IMPRESSION: NG tube in place. Endotracheal tube in place. Right IJ central line with tip in SVC right atrium junction. No pneumothorax. Again noted patchy bilateral interstitial prominence. Electronically Signed   By: Natasha Mead M.D.   On: 11/07/2014 18:05   Dg Abd Portable 1v  11/07/2014  CLINICAL DATA:  OG tube placement. EXAM: PORTABLE ABDOMEN - 1 VIEW COMPARISON:  None. FINDINGS: Nasogastric terminates in the stomach. Bowel gas  pattern is unremarkable. Incidental imaging of the lower chest shows coarsening of the pulmonary markings. IMPRESSION: Nasogastric tube terminates in the stomach. Electronically Signed   By: Leanna Battles M.D.   On: 11/07/2014 18:03    Medical Consultants:  Cardiology   Other Consultants:  PT/OT/SLP   Debbora Presto, MD  [facility] Pager [phone]  If 7PM-7AM, please contact night-coverage [email or web address] Password [email or web address] 11/15/2014, 2:50 PM   LOS: 9 days   HPI/Subjective: No events overnight.   Objective: Filed Vitals:   11/15/14 0134 11/15/14 0500 11/15/14 1043 11/15/14 1100  BP:  166/77  135/75  Pulse:  80 91 90  Temp:  98.5 F (36.9 C)    TempSrc:  Oral    Resp:  22 22   Height:      Weight:      SpO2: 90% 88% 91% 92%    Intake/Output Summary (Last 24 hours) at 11/15/14 1450 Last data filed at 11/15/14 0900  Gross per 24 hour  Intake   1190 ml  Output   3700 ml  Net  -2510 ml    Exam:   General:  Pt is lethargic, unresponsive   Cardiovascular: Irregular rate and rhythm, no rubs, no gallops  Respiratory:Crackles with bilateral rhonchi   Abdomen: Soft, non tender, non distended, bowel sounds present, no guarding  Data Reviewed: Basic Metabolic Panel:  Recent Labs Lab 11/09/14 0430 11/10/14 0500 11/11/14 0455 11/12/14 0530 11/13/14 0430  NA 141 144 147* 150* 151*  K 4.9 4.3 4.2 4.1 3.9  CL 103 106 108 109 110  CO2 30 32 32 33* 34*  GLUCOSE 140* 126* 144* 124* 140*  BUN 53* 72* 76* 71* 75*  CREATININE 1.72* 1.70* 1.42* 1.47* 1.51*  CALCIUM 8.5* 8.5* 8.6* 8.4*  8.3*  MG  --   --  2.8* 2.6* 2.5*  PHOS  --   --  3.0 3.3 3.4    CBC:  Recent Labs Lab 11/09/14 0430 11/10/14 0500 11/11/14 0455 11/12/14 0530 11/13/14 0430  WBC 15.4* 11.7* 13.5* 16.9* 14.5*  NEUTROABS 14.0*  --   --   --   --   HGB 12.7* 11.9* 12.1* 13.0 12.7*  HCT 39.0 37.5* 38.0* 41.8 40.2  MCV 100.3* 101.9* 101.3* 102.5* 102.3*  PLT 260 220 221 231 211   Cardiac  Enzymes:  Recent Labs Lab 11/09/14 0930 11/09/14 1525  TROPONINI 0.43* 0.44*   BNP: Invalid input(s): POCBNP CBG:  Recent Labs Lab 11/14/14 2017 11/15/14 0048 11/15/14 0536 11/15/14 0753 11/15/14 1215  GLUCAP 116* 100* 101* 125* 138*    Recent Results (from the past 240 hour(s))  Blood culture (routine x 2)     Status: None   Collection Time: 11/06/14  6:20 PM  Result Value Ref Range Status   Specimen Description BLOOD RIGHT FOREARM  Final   Special Requests BOTTLES DRAWN AEROBIC AND ANAEROBIC  Final   Culture   Final    NO GROWTH 5 DAYS Performed at [facility]    Report Status 11/11/2014 FINAL  Final  Blood culture (routine x 2)     Status: None   Collection Time: 11/06/14  6:53 PM  Result Value Ref Range Status   Specimen Description BLOOD LEFT FOREARM  Final   Special Requests BOTTLES DRAWN AEROBIC AND ANAEROBIC  Final   Culture   Final    NO GROWTH 5 DAYS Performed at [facility]    Report Status 11/11/2014 FINAL  Final  MRSA PCR Screening     Status: None   Collection Time: 11/06/14  8:57 PM  Result Value Ref Range Status   MRSA by PCR NEGATIVE NEGATIVE Final    Comment:        The GeneXpert MRSA Assay (FDA approved for NASAL specimens only), is one component of a comprehensive MRSA colonization surveillance program. It is not intended to diagnose MRSA infection nor to guide or monitor treatment for MRSA infections.   Culture, respiratory (NON-Expectorated)     Status: None   Collection Time: 11/11/14 12:40 PM  Result Value Ref Range Status   Specimen Description TRACHEAL ASPIRATE  Final   Special Requests NONE  Final   Gram Stain   Final    NO WBC SEEN RARE SQUAMOUS EPITHELIAL CELLS PRESENT NO ORGANISMS SEEN Performed at [facility]    Culture   Final    Non-Pathogenic Oropharyngeal-type Flora Isolated. Performed at [facility]    Report Status 11/13/2014 FINAL  Final  C difficile quick scan w  PCR reflex     Status: Abnormal   Collection Time: 11/11/14  1:22 PM  Result Value Ref Range Status   C Diff antigen POSITIVE (A) NEGATIVE Final   C Diff toxin POSITIVE (A) NEGATIVE Final   C Diff interpretation Positive for toxigenic C. difficile  Final    Comment: CRITICAL RESULT CALLED TO, READ BACK BY AND VERIFIED WITH: A ARNOLD AT 1518 ON 10.23.2016 BY NBROOKS      Scheduled Meds: . antiseptic oral rinse  7 mL Mouth Rinse QID  . chlorhexidine gluconate  15 mL Mouth Rinse BID  . digoxin  0.125 mg Intravenous Daily  . diltiazem  60 mg Oral 3 times per day  . famotidine (PEPCID) IV  20 mg Intravenous Q24H  . furosemide  40 mg Intravenous Q12H  . insulin aspart  0-9 Units Subcutaneous 6 times per day  . LORazepam  0.5 mg Intravenous BID  . methylPREDNISolone (SOLU-MEDROL) injection  40 mg Intravenous Daily  . pantoprazole (PROTONIX) IV  40 mg Intravenous Q24H  . QUEtiapine  25 mg Oral BID  . rivaroxaban  15 mg Oral Daily  . sodium chloride  3 mL Intravenous Q12H  . vancomycin  125 mg Oral 4 times per day   Continuous Infusions: . dextrose 75 mL/hr at 11/14/14 1457

## 2014-11-16 DIAGNOSIS — Z515 Encounter for palliative care: Secondary | ICD-10-CM | POA: Insufficient documentation

## 2014-11-16 DIAGNOSIS — I10 Essential (primary) hypertension: Secondary | ICD-10-CM

## 2014-11-16 LAB — GLUCOSE, CAPILLARY
GLUCOSE-CAPILLARY: 112 mg/dL — AB (ref 65–99)
GLUCOSE-CAPILLARY: 112 mg/dL — AB (ref 65–99)

## 2014-11-16 MED ORDER — MORPHINE SULFATE (PF) 2 MG/ML IV SOLN: 2.0000 mg | INTRAVENOUS | Status: AC | PRN

## 2014-11-16 MED ORDER — LEVALBUTEROL HCL 1.25 MG/0.5ML IN NEBU: 1.2500 mg | INHALATION_SOLUTION | Freq: Four times a day (QID) | RESPIRATORY_TRACT | Status: AC | PRN

## 2014-11-16 NOTE — Progress Notes (Signed)
Rt placed a new water bottle on [facility].

## 2014-11-16 NOTE — Consult Note (Addendum)
Received request from CSW Marylu Lund for family interest in [facility]. Chart reviewed. Received report from RN and Dr. Neale Burly. Spoke with son Orvilla Fus and daughter Burna Mortimer by phone. Both agreeable to transfer to [facility] tomorrow if transfer still makes sense. Both aware patient cannot be transferred until registration paper work is complete. Son has no transportation to get to hospital today and is going out of town this evening. Daughter is traveling back to [facility] today. Daughter did not feel her Aunt Chales Abrahams is up to coming to hospital to complete paper work today due to her own health issues. Daughter agreeable for paperwork to be emailed so she can complete when she arrives home. Daughter is requesting that patient be re-evaluated in am to make sure transfer still makes sense. [facility] will follow up with CSW Rene Kocher and family tomorrow morning. CSWs Marylu Lund and [place] aware of above plan.   Thank you.  Forrestine Him, LCSW [phone]

## 2014-11-16 NOTE — Progress Notes (Signed)
Patient ID: [name], male   DOB: 02/08/1931, 81 y.o.   MRN: [id number]  [facility] PROGRESS NOTE  ANDREAUS TEZAK JGO:[id number] DOB: 11-Aug-1931 DOA: 11/06/2014 PCP: [name], MD   Brief narrative:    81 year old male with a past medical history significant for pulmonary fibrosis was admitted on 11/07/2014 with acute hypoxemic respiratory failure in the setting of increasing leg swelling. He has known ischemic cardiomyopathy.  STUDIES:  11/06/2014 CT angiogram chest no pulmonary embolism, there is diffuse bilateral airspace disease with small pleural effusions consistent with pulmonary edema on top of a background of significant pulmonary fibrosis. 11/07/2014 echocardiogram> LVEF 45-50%, mild left atrium dilation, RV moderately dilated, PAS 26mm Hg  SIGNIFICANT EVENTS: 10/19 worsening hypoxemia, required intubation 10/25 extubated 10/27 [facility] assumed care   Assessment/Plan:    Acute respiratory failure with hypoxemia in the setting of acute pulmonary edema with hx of idiopathic pulmonary fibrosis - required intubation - successfully extubated 10/25 but remains lethargic, unable to awake  - unable to take anything PO - d/w PCT, family in agreement with focus on comfort - no further blood work to ensure comfort - referral to [facility] place made  Acute decompensated heart failure, systolic - continue lasix as noted above - weight is 85 kg this AM - focus on comfort as noted above   A. fib with RVR > improved 10/20 - stop AC as pt unable to take PO, will stop digoxin as well  - family in agreement with full comfort   Mild hypokalemia in the setting of diuresis - supplemented   AKI  - Cr overall trending down  - no further blood work, to ensure comfort   Hypernatremia - has been on D5 but Na is worse - no further blood work to ensure comfort  Possible healthcare associated pneumonia BCx2 10/18 >>  10/18 vancomycin >> 10/21 10/18 Ceftazidime >>  10/24  C. Diff - 10/23 PO vanco >> - pt unable to take anything PO at this time - again, family in agreement with full comfort  Acute metabolic encephalopathy - from FTT, respiratory failure, hypernatremia - still lethargic this AM  - give ativan only as needed  - Restarted Seroquel by PCCM but pt unable to take anything PO  Functional quadriplegia - in the setting of progressive illness   DVT prophylaxis - SCD's  Code Status: DNR Family Communication:  No family at bedside  Disposition Plan: Referral to [facility] place   IV access:  Peripheral IV  Procedures and diagnostic studies:    Dg Chest 2 View  11/06/2014  CLINICAL DATA:  Short of breath EXAM: CHEST  2 VIEW COMPARISON:  10/08/2014 FINDINGS: Chronic interstitial fibrosis is noted. Progression of bilateral airspace disease compared with prior studies suggestive of superimposed acute process such as edema or bilateral pneumonia. No significant pleural effusion. Cardiac enlargement. IMPRESSION: Chronic interstitial fibrosis. Superimposed acute airspace disease bilaterally may be due to edema or pneumonia bilaterally. Electronically Signed   By: Marlan Palau M.D.   On: 11/06/2014 14:28   Ct Angio Chest Pe W/cm &/or Wo Cm  11/06/2014  CLINICAL DATA:  Dyspnea.  Hypoxia.  COPD.  Hemoptysis. EXAM: CT ANGIOGRAPHY CHEST WITH CONTRAST TECHNIQUE: Multidetector CT imaging of the chest was performed using the standard protocol during bolus administration of intravenous contrast. Multiplanar CT image reconstructions and MIPs were obtained to evaluate the vascular anatomy. CONTRAST:  OMNIPAQUE IOHEXOL 350 MG/ML SOLN COMPARISON:  Multiple exams, including 11/06/2014 hand 08/21/2013. FINDINGS: Mediastinum/Nodes: No  filling defect is identified in the pulmonary arterial tree to suggest pulmonary embolus. Coronary, aortic arch, and branch vessel atherosclerotic vascular disease. Moderate cardiomegaly. Interventricular septum thickness 2.1 cm  compatible with left ventricular hypertrophy. Scattered mild mediastinal adenopathy. Index prevascular node 1.2 cm, image 37 series 4. Index right lower paratracheal node 1.7 cm, image 33 series 4. Lungs/Pleura: Chronic peripheral coarse interstitial accentuation compatible with fibrosis. Superimposed ground-glass and airspace opacities with secondary pulmonary lobular interstitial accentuation and scattered ground-glass opacities, favoring acute pulmonary edema and less likely to be due to atypical infectious process. Small bilateral pleural effusions. Upper abdomen: There is some reflux of contrast medium into the hepatic veins. Several small hypodense lesions in the lateral segment left hepatic lobe,, similar to prior exam. Musculoskeletal: 6 mm of anterolisthesis at C7-T1, not changed from 08/31/2013. 5-10% superior endplate compression fracture at the T5 vertebral level, image 93 series 10, new compared to the CT scan from 08/31/2013. A all Review of the MIP images confirms the above findings. IMPRESSION: 1. No filling defect is identified in the pulmonary arterial tree to suggest pulmonary embolus. 2. Moderate cardiomegaly with acute pulmonary edema superimposed on chronic peripheral fibrosis/usual interstitial pneumonia. 3. Left ventricular hypertrophy. 4. Scattered mild mediastinal adenopathy, likely due to passive congestion. 5. 5-10% superior endplate compression fracture at the T5 vertebral level is new compared to 08/31/13 and probably subacute. 6. Small bilateral pleural effusions. 7. Several small hypodense lesions in the lateral segment left hepatic lobe, similar to prior CT chest. Electronically Signed   By: Gaylyn Rong M.D.   On: 11/06/2014 17:20   Dg Chest Port 1 View  11/12/2014  CLINICAL DATA:  Acute respiratory failure EXAM: PORTABLE CHEST 1 VIEW COMPARISON:  11/11/2014 FINDINGS: There is an endotracheal tube with the tip 2.9 cm above the carina. There is a right IJ central venous  catheter with the tip projecting over the cavoatrial junction. There is a nasogastric tube coursing below the diaphragm. There are bilateral trace pleural effusions. There is bilateral interstitial and alveolar airspace opacity. The heart mediastinum are stable. The osseous structures are unremarkable. IMPRESSION: 1. Bilateral interstitial and alveolar airspace opacities with trace bilateral pleural effusions which may reflect pulmonary edema versus multilobar pneumonia versus ARDS. Electronically Signed   By: Elige Ko   On: 11/12/2014 10:03   Dg Chest Port 1 View  11/11/2014  CLINICAL DATA:  Acute respiratory failure, hypertension, former smoker, pulmonary fibrosis, atrial fibrillation, chronic diastolic CHF EXAM: PORTABLE CHEST 1 VIEW COMPARISON:  Portable exam 0443 hours compared to 11/09/2014 FINDINGS: Tip of endotracheal tube projects 4.3 cm above carina. Nasogastric tube extends into stomach. RIGHT jugular central venous catheter tip projects over cavoatrial junction. Enlargement of cardiac silhouette with pulmonary vascular congestion. Persistent severe diffuse interstitial and airspace infiltrates, unchanged. No definite pleural effusion or pneumothorax. IMPRESSION: Persistent severe diffuse BILATERAL pulmonary infiltrates. This may represent pulmonary fibrosis with superimposed edema or infection. Enlargement of cardiac silhouette with pulmonary vascular congestion. Electronically Signed   By: Ulyses Southward M.D.   On: 11/11/2014 07:42   Dg Chest Port 1 View  11/09/2014  CLINICAL DATA:  Followup respiratory distress/ pulmonary edema. EXAM: PORTABLE CHEST 1 VIEW COMPARISON:  11/09/2014 at 0507 hours FINDINGS: The endotracheal to is 4 cm above the carina. The right IJ catheter is stable. The NG tube is stable. Persistent diffuse interstitial and airspace process in the lungs. No definite pleural effusions. IMPRESSION: Stable support apparatus. Persistent diffuse interstitial and airspace process.  Electronically Signed  By: Rudie Meyer M.D.   On: 11/09/2014 12:30   Dg Chest Port 1 View  11/09/2014  CLINICAL DATA:  Respiratory failure EXAM: PORTABLE CHEST 1 VIEW COMPARISON:  11/07/2014 chest radiograph FINDINGS: Endotracheal tube tip is 4.7 cm above the carina. Right internal jugular central venous catheter terminates in the lower third of the superior vena cava. Enteric tube terminates in the proximal stomach Stable cardiomediastinal silhouette with mild cardiomegaly. No pneumothorax. No pleural effusion. There is severe hazy and distorted reticular opacities throughout both lungs, not appreciably changed. IMPRESSION: 1. Well-positioned lines and tubes. 2. Stable severe hazy and reticular opacities throughout both lungs, in keeping with underlying advanced interstitial lung disease, cannot exclude superimposed pulmonary edema given the mild cardiomegaly and hazy opacities. Electronically Signed   By: Delbert Phenix M.D.   On: 11/09/2014 07:13   Portable Chest Xray  11/07/2014  CLINICAL DATA:  Endotracheal tube placement, central line placement EXAM: PORTABLE CHEST 1 VIEW COMPARISON:  11/06/2014 FINDINGS: Borderline cardiomegaly. NG tube in place. Endotracheal tube in place with tip 3.9 cm above the carina. No pneumothorax. Right IJ central line with tip in SVC right atrium junction. Again noted patchy interstitial prominence bilateral probable pulmonary edema superimposed on chronic interstitial lung disease. IMPRESSION: NG tube in place. Endotracheal tube in place. Right IJ central line with tip in SVC right atrium junction. No pneumothorax. Again noted patchy bilateral interstitial prominence. Electronically Signed   By: Natasha Mead M.D.   On: 11/07/2014 18:05   Dg Abd Portable 1v  11/07/2014  CLINICAL DATA:  OG tube placement. EXAM: PORTABLE ABDOMEN - 1 VIEW COMPARISON:  None. FINDINGS: Nasogastric terminates in the stomach. Bowel gas pattern is unremarkable. Incidental imaging of the lower  chest shows coarsening of the pulmonary markings. IMPRESSION: Nasogastric tube terminates in the stomach. Electronically Signed   By: Leanna Battles M.D.   On: 11/07/2014 18:03    Medical Consultants:  Cardiology  PCT  Other Consultants:  PT/OT/SLP  Debbora Presto, MD  [facility] Pager [phone]  If 7PM-7AM, please contact night-coverage [email or web address] Password [email or web address] 11/16/2014, 1:38 PM   LOS: 10 days   HPI/Subjective: No events overnight.   Objective: Filed Vitals:   11/15/14 2259 11/16/14 0237 11/16/14 0500 11/16/14 0811  BP: 176/96  152/98   Pulse:  78 84   Temp:   98.4 F (36.9 C)   TempSrc:   Oral   Resp:  20 20   Height:      Weight:      SpO2:  94% 92% 92%    Intake/Output Summary (Last 24 hours) at 11/16/14 1338 Last data filed at 11/16/14 0906  Gross per 24 hour  Intake     20 ml  Output   2950 ml  Net  -2930 ml    Exam:   General:  Pt is lethargic, unresponsive   Cardiovascular: Irregular rate and rhythm, no rubs, no gallops  Respiratory:Crackles with bilateral rhonchi   Abdomen: Soft, non tender, non distended, bowel sounds present, no guarding  Data Reviewed: Basic Metabolic Panel:  Recent Labs Lab 11/10/14 0500 11/11/14 0455 11/12/14 0530 11/13/14 0430  NA 144 147* 150* 151*  K 4.3 4.2 4.1 3.9  CL 106 108 109 110  CO2 32 32 33* 34*  GLUCOSE 126* 144* 124* 140*  BUN 72* 76* 71* 75*  CREATININE 1.70* 1.42* 1.47* 1.51*  CALCIUM 8.5* 8.6* 8.4* 8.3*  MG  --  2.8* 2.6* 2.5*  PHOS  --  3.0 3.3 3.4  CBC:  Recent Labs Lab 11/10/14 0500 11/11/14 0455 11/12/14 0530 11/13/14 0430  WBC 11.7* 13.5* 16.9* 14.5*  HGB 11.9* 12.1* 13.0 12.7*  HCT 37.5* 38.0* 41.8 40.2  MCV 101.9* 101.3* 102.5* 102.3*  PLT 220 221 231 211   Cardiac Enzymes:  Recent Labs Lab 11/09/14 1525  TROPONINI 0.44*   BNP: Invalid input(s): POCBNP CBG:  Recent Labs Lab 11/15/14 0753 11/15/14 1215 11/15/14 2019 11/16/14 0022 11/16/14 0404   GLUCAP 125* 138* 126* 112* 112*    Recent Results (from the past 240 hour(s))  Blood culture (routine x 2)     Status: None   Collection Time: 11/06/14  6:20 PM  Result Value Ref Range Status   Specimen Description BLOOD RIGHT FOREARM  Final   Special Requests BOTTLES DRAWN AEROBIC AND ANAEROBIC  Final   Culture   Final    NO GROWTH 5 DAYS Performed at [facility]    Report Status 11/11/2014 FINAL  Final  Blood culture (routine x 2)     Status: None   Collection Time: 11/06/14  6:53 PM  Result Value Ref Range Status   Specimen Description BLOOD LEFT FOREARM  Final   Special Requests BOTTLES DRAWN AEROBIC AND ANAEROBIC  Final   Culture   Final    NO GROWTH 5 DAYS Performed at [facility]    Report Status 11/11/2014 FINAL  Final  MRSA PCR Screening     Status: None   Collection Time: 11/06/14  8:57 PM  Result Value Ref Range Status   MRSA by PCR NEGATIVE NEGATIVE Final    Comment:        The GeneXpert MRSA Assay (FDA approved for NASAL specimens only), is one component of a comprehensive MRSA colonization surveillance program. It is not intended to diagnose MRSA infection nor to guide or monitor treatment for MRSA infections.   Culture, respiratory (NON-Expectorated)     Status: None   Collection Time: 11/11/14 12:40 PM  Result Value Ref Range Status   Specimen Description TRACHEAL ASPIRATE  Final   Special Requests NONE  Final   Gram Stain   Final    NO WBC SEEN RARE SQUAMOUS EPITHELIAL CELLS PRESENT NO ORGANISMS SEEN Performed at [facility]    Culture   Final    Non-Pathogenic Oropharyngeal-type Flora Isolated. Performed at [facility]    Report Status 11/13/2014 FINAL  Final  C difficile quick scan w PCR reflex     Status: Abnormal   Collection Time: 11/11/14  1:22 PM  Result Value Ref Range Status   C Diff antigen POSITIVE (A) NEGATIVE Final   C Diff toxin POSITIVE (A) NEGATIVE Final   C Diff interpretation  Positive for toxigenic C. difficile  Final    Comment: CRITICAL RESULT CALLED TO, READ BACK BY AND VERIFIED WITH: A ARNOLD AT 1518 ON 10.23.2016 BY NBROOKS      Scheduled Meds: . chlorhexidine gluconate  15 mL Mouth Rinse BID  . digoxin  0.125 mg Intravenous Daily  . furosemide  40 mg Intravenous Q12H  . sodium chloride  3 mL Intravenous Q12H   Continuous Infusions:

## 2014-11-16 NOTE — Progress Notes (Signed)
CSW rec'd MD order for residential hospice- contacted daughter, Burna Mortimer, by phone who is on her way  back to [facility]. She is agreeable to referral to [facility]. Consult made to Forrestine Him, MSW- [facility].    Reece Levy, MSW, [facility]  [phone]

## 2014-11-16 NOTE — Progress Notes (Signed)
Daily Progress Note   Patient Name: [name]       Date: 11/16/2014 DOB: 1931/09/13  Age: 81 y.o. MRN#: [id number] Attending Physician: Dorothea Ogle, MD Primary Care Physician: [name], MD Admit Date: 11/06/2014  Reason for Consultation/Follow-up: Disposition and Establishing goals of care  Subjective: 81 year old male with a past medical history significant for pulmonary fibrosis and ischemic cardiomyopathy admitted with acute hypoxemic respiratory failure. He was extubated on 10/25. He has continued to decline and now is unresponsive to verbal or tactile stimulation.  Interval Events: I saw and examined Mr. Gonzalez this afternoon.  He was lying in bed in no distress.  He opened his eyes to command, but was otherwise not interactive.  His bedside CNA reports that he did say a few words earlier today, but nothing recently.  I had called and spoken with his daughter Burna Mortimer.  She continued to endorse that her father's comfort is the main concern at this time.  I discussed referral for evaluation to [facility] with her, and she was in agreement with beginning this process.  Length of Stay: 10 days  Current Medications: Scheduled Meds:  . chlorhexidine gluconate  15 mL Mouth Rinse BID  . furosemide  40 mg Intravenous Q12H  . sodium chloride  3 mL Intravenous Q12H    Continuous Infusions:    PRN Meds: sodium chloride, bisacodyl, glycopyrrolate, haloperidol lactate, levalbuterol, LORazepam, morphine injection, ondansetron (ZOFRAN) IV, sodium chloride, sodium chloride  Palliative Performance Scale: 10%     Vital Signs: BP 143/98 mmHg  Pulse 84  Temp(Src) 98.6 F (37 C) (Oral)  Resp 20  Ht 5\' 8"  (1.727 m)  Wt 85.2 kg (187 lb 13.3 oz)  BMI 28.57 kg/m2  SpO2 93% SpO2: SpO2: 93 % O2 Device: O2 Device: High Flow Nasal Cannula O2 Flow Rate: O2 Flow Rate (L/min): 15 L/min  Intake/output summary:  Intake/Output Summary (Last 24 hours) at 11/16/14 1406 Last  data filed at 11/16/14 0906  Gross per 24 hour  Intake     20 ml  Output   2950 ml  Net  -2930 ml   LBM:   Baseline Weight: Weight: 92 kg (202 lb 13.2 oz) Most recent weight: Weight: 85.2 kg (187 lb 13.3 oz)  Physical Exam: General: Lying in bed, high flow nasal cannula in place, opens eyes to verbal or tactile stimulation but does not track examiner or otherwise interact, in no acute distress.  HEENT: No bruits, no goiter, no JVD Heart: irregular. No murmur appreciated. Peripheral pulses present but diminished. Lungs: Fair air movement. Scattered Rhonchi Abdomen: Soft, nontender, nondistended, positive bowel sounds.  Ext: Some edema Neuro: Unable to assess.              Additional Data Reviewed: No results for input(s): WBC, HGB, PLT, NA, BUN, CREATININE in the last 72 hours.  Invalid input(s): ALB   Problem List:  Patient Active Problem List   Diagnosis Date Noted  . Acute congestive heart failure (HCC)   . Acute respiratory failure with hypoxemia (HCC)   . Chronic anticoagulation   . Elevated troponin   . Acute CHF (congestive heart failure) (HCC) 11/06/2014  . Respiratory failure (HCC) 11/06/2014  . Dyspnea 05/24/2014  . Acute on chronic systolic CHF (congestive heart failure) (HCC) 02/22/2014  . Acute respiratory failure with hypoxia (HCC) 02/21/2014  . UTI (lower urinary tract infection) 02/20/2014  . Impaired mobility and ADLs 02/20/2014  . Hematuria 02/20/2014  . Encounter for counseling  for care management of patient with chronic conditions and complex health needs using nurse-based model 01/29/2014  . Urinary incontinence 12/26/2013  . Demand ischemia of myocardium - possible 11/20/2013  . Wide-complex tachycardia (HCC) 11/16/2013  . NSTEMI (non-ST elevation myocardial infarction) (HCC) 11/15/2013  . Dysphagia, unspecified(787.20) 09/30/2012  . Chronic diastolic heart failure (HCC)   . Chronic a-fib (HCC) 01/22/2011  . Anxiety 09/15/2010  . Depression  09/15/2010  . Insomnia 09/15/2010  . BENIGN POSITIONAL VERTIGO 12/30/2009  . HYPERGLYCEMIA 12/30/2009  . HYPERLIPIDEMIA 07/17/2009  . Hereditary and idiopathic peripheral neuropathy 07/17/2009  . Essential hypertension 07/17/2009  . Atrial flutter (HCC) 07/17/2009  . VENOUS INSUFFICIENCY, LEFT LEG 07/17/2009  . ALLERGIC RHINITIS 07/17/2009  . Rheumatoid arthritis (HCC) 07/17/2009  . IPF (idiopathic pulmonary fibrosis) (HCC) 05/01/2009  . Sleep apnea 05/01/2009     Palliative Care Assessment & Plan    Code Status:  DNR  Goals of Care:  Comfort care only  Possible transition to [facility]  Symptom Management:  Dyspnea: He has been tolerating current dose of morphine well. We'll plan to continue same and would titrate up as needed to maintain his comfort. I discussed at length with family regarding his high flow nasal cannula. As he is currently tolerating it well with no signs of discomfort, we will plan to continue same at this time. If, however, this becomes an issue and seems to be adding discomfort, I advised that it is something that would discontinue at that time. Family is in agreement with this plan.  Anxiety: Continue lorazepam as needed  Agitation: Continue Haldol as needed  Pain: Morphine as needed  Excess secretions: Glycopyrrolate as needed  Constipation: Dulcolax as needed  Psycho-social/Spiritual:  Desire for further Chaplaincy support:no   Prognosis: Hours - Days.  He has respiratory failure that will most likely be the cause of his death. He is minimally interactive today (only opens eyes to command), but I would consider him stable enough at this point to consider transfer to residential hospice facility.  Additionally, he is currently not taking any nutritional support or hydration which would make his overall prognosis less than 2 weeks regardless of course of his respiratory failure.  I do suspect that his respiratory failure will be the cause of  his death much sooner than this.  Discharge Planning: Hospice facility   Care plan was discussed with social worker, pt dtr via phone, and beside CNA.  Thank you for allowing the Palliative Medicine Team to assist in the care of this patient.   Time In: 1340 Time Out: 1405 Total Time 25 Prolonged Time Billed No    Greater than 50%  of this time was spent counseling and coordinating care related to the above assessment and plan.   Romie Minus, MD  11/16/2014, 2:06 PM  Please contact Palliative Medicine Team phone at [phone] for questions and concerns.

## 2014-11-17 MED ORDER — SCOPOLAMINE 1 MG/3DAYS TD PT72: 1.0000 | MEDICATED_PATCH | TRANSDERMAL | Status: AC

## 2014-11-17 MED ORDER — LORAZEPAM 2 MG/ML IJ SOLN
1.0000 mg | Freq: Once | INTRAMUSCULAR | Status: AC
  Administered 2014-11-17: 1 mg via INTRAVENOUS

## 2014-11-17 MED ORDER — LORAZEPAM 0.5 MG PO TABS: 0.5000 mg | ORAL_TABLET | Freq: Four times a day (QID) | ORAL | Status: AC | PRN

## 2014-11-17 MED ORDER — SCOPOLAMINE 1 MG/3DAYS TD PT72
1.0000 | MEDICATED_PATCH | TRANSDERMAL | Status: DC
  Administered 2014-11-17: 1.5 mg via TRANSDERMAL
  Filled 2014-11-17: qty 1

## 2014-11-17 MED ORDER — MORPHINE SULFATE (CONCENTRATE) 10 MG/0.5ML PO SOLN: 10.0000 mg | ORAL | Status: AC | PRN

## 2014-11-17 NOTE — Discharge Instructions (Signed)
Acute Respiratory Distress Syndrome °Acute respiratory distress syndrome is a life-threatening condition in which fluid collects in the lungs. This condition can cause severe shortness of breath and low oxygen levels in the blood. It can also cause the lungs and other vital organs to fail. The condition usually develops within 24 to 48 hours of an infection, illness, surgery, or injury. °CAUSES °This condition may be caused by: °· An infection, such as sepsis or pneumonia. °· A serious injury to the head or chest. °· A major surgery. °· A drug overdose. °· Breathing in harmful chemicals or smoke. °· Blood transfusions. °· A blood clot in the lungs. °SYMPTOMS °Symptoms of this condition include: °· Fever. °· Difficulty breathing. °· Bluish skin (cyanosis). °· A fast or irregular heartbeat. °· Low blood pressure (hypotension). °· Agitation. °· Confusion. °· Lack of energy (lethargy). °· Sweating. °DIAGNOSIS °This condition may be diagnosed by using tests to rule out other diseases and conditions that cause similar symptoms. You may have: °· A chest X-ray or CT scan. °· Blood tests. °· A sputum culture. In this test, you will be asked to spit so that a sample of lung fluid can be taken for testing. °· Bronchoscopy. During this test a thin, flexible tool is passed into the mouth or nose, down the windpipe, and into the lungs. °TREATMENT °This condition may be treated with: °· Oxygen. A breathing machine (ventilator) is often used to provide oxygen and help with breathing. °· Medicine to help you relax (sedative). °· Fluids and nutrients given through an IV tube. °· Blood pressure medicine. °· Steroid medicine to help decrease inflammation in the lungs. °· Diuretic medicine to get rid of extra fluid in the body. °Additional treatment may be needed, depending on the cause of the condition. It can take up to 12 months to recover from this condition. Some people recover fully, but others may continue to  have: °· Weakness. °· Shortness of breath. °· Memory problems. °· Depression. °HOME CARE INSTRUCTIONS °Until you recover from this condition: °· Do not smoke. °· Limit alcohol intake to no more than 1 drink per day for nonpregnant women and 2 drinks per day for men. One drink equals 12 oz of beer, 5 oz of wine, or 1½ oz of hard liquor. °· Ask friends and family to help you if daily activities make you tired. °SEEK MEDICAL CARE IF: °· You become short of breath with activity or while resting. °· You develop a cough that does not go away. °· You have a fever. °SEEK IMMEDIATE MEDICAL CARE IF: °· You have sudden shortness of breath. °· You develop chest pain that does not go away. °· You develop swelling or pain in one of your legs. °· You cough up blood. °  °This information is not intended to replace advice given to you by your health care provider. Make sure you discuss any questions you have with your health care provider. °  °Document Released: 01/05/2005 Document Revised: 05/22/2014 Document Reviewed: 01/01/2014 °Elsevier Interactive Patient Education ©2016 [employer] ° °

## 2014-11-17 NOTE — Progress Notes (Signed)
Daily Progress Note   Patient Name: [name]       Date: 12/16/2014 DOB: 03-Oct-1931  Age: 81 y.o. MRN#: [id number] Attending Physician: Dorothea Ogle, MD Primary Care Physician: [name], MD Admit Date: 11/06/2014  Reason for Consultation/Follow-up: Disposition and Establishing goals of care  Subjective: 81 year old male with a past medical history significant for pulmonary fibrosis and ischemic cardiomyopathy admitted with acute hypoxemic respiratory failure. He was extubated on 10/25. He has continued to decline and now is unresponsive to verbal or tactile stimulation.  Interval Events: I saw and examined [name] this morning.  He was lying in bed in no distress.  He opened his eyes to command, but was otherwise not interactive.  His bedside nurse reports anxiety earlier that calmed with ativan.  I called and spoke with Dr. Lenise Arena regarding potential discharge to [facility].  She is in agreement that he appears to be stable from yesterday and will therefore continue to work toward transfer to [facility].  This would, however, be dependent on no further decline in the interim while transfer is arranged.  Length of Stay: 11 days  Current Medications: Scheduled Meds:  . chlorhexidine gluconate  15 mL Mouth Rinse BID  . furosemide  40 mg Intravenous Q12H  . sodium chloride  3 mL Intravenous Q12H    Continuous Infusions:    PRN Meds: sodium chloride, bisacodyl, glycopyrrolate, haloperidol lactate, levalbuterol, LORazepam, morphine injection, ondansetron (ZOFRAN) IV, sodium chloride, sodium chloride  Palliative Performance Scale: 10%     Vital Signs: BP 141/99 mmHg  Pulse 105  Temp(Src) 98.8 F (37.1 C) (Axillary)  Resp 20  Ht 5\' 8"  (1.727 m)  Wt 85.2 kg (187 lb 13.3 oz)  BMI 28.57 kg/m2  SpO2 94% SpO2: SpO2: 94 % O2 Device: O2 Device: High Flow Nasal Cannula O2 Flow Rate: O2 Flow Rate (L/min): 10 L/min (rn aware)  Intake/output summary:    Intake/Output Summary (Last 24 hours) at 2014/12/16 0814 Last data filed at 12/16/2014 0540  Gross per 24 hour  Intake     70 ml  Output   1450 ml  Net  -1380 ml   LBM:   Baseline Weight: Weight: 92 kg (202 lb 13.2 oz) Most recent weight: Weight: 85.2 kg (187 lb 13.3 oz)  Physical Exam: General: Lying in bed, high flow nasal cannula in place, opens eyes to verbal or tactile stimulation but does not track examiner or otherwise interact, in no acute distress.  HEENT: No bruits, no goiter, no JVD Heart: irregular. No murmur appreciated. Peripheral pulses present but diminished. Lungs: Fair air movement. Scattered Rhonchi. Respirations regular with no apnea Abdomen: Soft, nontender, nondistended, positive bowel sounds.  Ext: Some edema Neuro: Unable to assess.              Additional Data Reviewed: No results for input(s): WBC, HGB, PLT, NA, BUN, CREATININE in the last 72 hours.  Invalid input(s): ALB   Problem List:  Patient Active Problem List   Diagnosis Date Noted  . Palliative care encounter   . Acute congestive heart failure (HCC)   . Acute respiratory failure with hypoxemia (HCC)   . Chronic anticoagulation   . Elevated troponin   . Acute CHF (congestive heart failure) (HCC) 11/06/2014  . Respiratory failure (HCC) 11/06/2014  . Dyspnea 05/24/2014  . Acute on chronic systolic CHF (congestive heart failure) (HCC) 02/22/2014  . Acute respiratory failure with hypoxia (HCC) 02/21/2014  . UTI (lower urinary tract infection)  02/20/2014  . Impaired mobility and ADLs 02/20/2014  . Hematuria 02/20/2014  . Encounter for counseling for care management of patient with chronic conditions and complex health needs using nurse-based model 01/29/2014  . Urinary incontinence 12/26/2013  . Demand ischemia of myocardium - possible 11/20/2013  . Wide-complex tachycardia (HCC) 11/16/2013  . NSTEMI (non-ST elevation myocardial infarction) (HCC) 11/15/2013  . Dysphagia, unspecified(787.20)  09/30/2012  . Chronic diastolic heart failure (HCC)   . Chronic a-fib (HCC) 01/22/2011  . Anxiety 09/15/2010  . Depression 09/15/2010  . Insomnia 09/15/2010  . BENIGN POSITIONAL VERTIGO 12/30/2009  . HYPERGLYCEMIA 12/30/2009  . HYPERLIPIDEMIA 07/17/2009  . Hereditary and idiopathic peripheral neuropathy 07/17/2009  . Essential hypertension 07/17/2009  . Atrial flutter (HCC) 07/17/2009  . VENOUS INSUFFICIENCY, LEFT LEG 07/17/2009  . ALLERGIC RHINITIS 07/17/2009  . Rheumatoid arthritis (HCC) 07/17/2009  . IPF (idiopathic pulmonary fibrosis) (HCC) 05/01/2009  . Sleep apnea 05/01/2009     Palliative Care Assessment & Plan    Code Status:  DNR  Goals of Care:  Comfort care only  Likely transition to [facility]  Symptom Management:  Dyspnea: He has been tolerating current dose of morphine well. We'll plan to continue same and would titrate up as needed to maintain his comfort. I discussed at length with family regarding his high flow nasal cannula. As he is currently tolerating it well with no signs of discomfort, we will plan to continue same at this time. If, however, this becomes an issue and seems to be adding discomfort, I advised that it is something that would discontinue at that time. Family is in agreement with this plan.  Anxiety: Continue lorazepam as needed  Agitation: Continue Haldol as needed  Pain: Morphine as needed  Excess secretions: Glycopyrrolate as needed  Constipation: Dulcolax as needed  Psycho-social/Spiritual:  Desire for further Chaplaincy support:no   Prognosis: Hours - Days.  He has respiratory failure that will most likely be the cause of his death. He remains minimally interactive today (only opens eyes to command), but with regular respirations, palpable peripheral pulses, and ability to open eyes to command, I would still consider him stable enough to transfer to residential hospice facility at this point.  This is something that can  change quickly, and should be reassessed if transfer cannot be accomplished in the next several hours.  Additionally, he is currently not taking any nutritional support or hydration which would make his overall prognosis less than 2 weeks regardless of course of his respiratory failure.  I do suspect that his respiratory failure will be the cause of his death much sooner than this.  Discharge Planning: Hospice facility   Care plan was discussed with bedside nurse and Dr. Izola Price via phone  Thank you for allowing the Palliative Medicine Team to assist in the care of this patient.   Time In: 0740 Time Out: 0805 Total Time 25 Prolonged Time Billed No    Greater than 50%  of this time was spent counseling and coordinating care related to the above assessment and plan.   Romie Minus, MD  12-04-2014, 8:14 AM  Please contact Palliative Medicine Team phone at [phone] for questions and concerns.

## 2014-11-17 NOTE — Discharge Summary (Signed)
Physician Discharge Summary  [name] GDJ:[id number] DOB: 1931/12/13 DOA: 11/06/2014  PCP: [name], MD  Admit date: 11/06/2014 Discharge date: 11/18/14  Recommendations for Outpatient Follow-up:  1. Pt will be discharged to [facility]   Discharge Diagnoses:  Principal Problem:   Acute on chronic systolic CHF (congestive heart failure) (HCC) Active Problems:   Essential hypertension   Atrial flutter (HCC)   IPF (idiopathic pulmonary fibrosis) (HCC)   Acute respiratory failure with hypoxia (HCC)   Acute CHF (congestive heart failure) (HCC)   Respiratory failure (HCC)   Acute congestive heart failure (HCC)   Acute respiratory failure with hypoxemia (HCC)   Chronic anticoagulation   Elevated troponin   Palliative care encounter   Discharge Condition: Stable  Diet recommendation: Comfort feeding as tolerated    Brief narrative:    81 year old male with a past medical history significant for pulmonary fibrosis was admitted on 11/07/2014 with acute hypoxemic respiratory failure in the setting of increasing leg swelling. He has known ischemic cardiomyopathy.  STUDIES:  11/06/2014 CT angiogram chest no pulmonary embolism, there is diffuse bilateral airspace disease with small pleural effusions consistent with pulmonary edema on top of a background of significant pulmonary fibrosis. 11/07/2014 echocardiogram> LVEF 45-50%, mild left atrium dilation, RV moderately dilated, PAS 10mm Hg  SIGNIFICANT EVENTS: 10/19 worsening hypoxemia, required intubation 10/25 extubated 10/27 [facility] assumed care   Assessment/Plan:    Acute respiratory failure with hypoxemia in the setting of acute pulmonary edema with hx of idiopathic pulmonary fibrosis - required intubation - successfully extubated 10/25 but remains lethargic, unable to awake  - unable to take anything PO - d/w PCT, family in agreement with focus on comfort - no further blood work to ensure comfort -  d/c to [facility] place   Acute decompensated heart failure, systolic - continue lasix as noted above if pt able to take PO, but if not Ok to stop as well  - focus on comfort as noted above   A. fib with RVR > improved 10/20 - stop AC as pt unable to take PO, will stop digoxin as well  - family in agreement with full comfort and d/c to [facility]   Mild hypokalemia in the setting of diuresis - supplemented   AKI  - Cr overall trending down  - no further blood work, to ensure comfort   Hypernatremia - has been on D5 but Na is worse - no further blood work to ensure comfort  Possible healthcare associated pneumonia BCx2 10/18 >>  10/18 vancomycin >> 10/21 10/18 Ceftazidime >> 10/24  C. Diff - 10/23 PO vanco >> - pt unable to take anything PO at this time - again, family in agreement with full comfort  Acute metabolic encephalopathy - from FTT, respiratory failure, hypernatremia - still lethargic this AM, comfortable  - give ativan only as needed  - Restarted Seroquel by PCCM but pt unable to take anything PO  Functional quadriplegia - in the setting of progressive illness    Code Status: DNR Family Communication: No family at bedside  Disposition Plan: [facility]   IV access:  Peripheral IV  Procedures and diagnostic studies:    Imaging Results    Dg Chest 2 View  11/06/2014 CLINICAL DATA: Short of breath EXAM: CHEST 2 VIEW COMPARISON: 10/08/2014 FINDINGS: Chronic interstitial fibrosis is noted. Progression of bilateral airspace disease compared with prior studies suggestive of superimposed acute process such as edema or bilateral pneumonia. No significant pleural effusion. Cardiac enlargement. IMPRESSION:  Chronic interstitial fibrosis. Superimposed acute airspace disease bilaterally may be due to edema or pneumonia bilaterally. Electronically Signed By: Marlan Palau M.D. On: 11/06/2014 14:28   Ct Angio Chest Pe W/cm &/or Wo  Cm  11/06/2014 CLINICAL DATA: Dyspnea. Hypoxia. COPD. Hemoptysis. EXAM: CT ANGIOGRAPHY CHEST WITH CONTRAST TECHNIQUE: Multidetector CT imaging of the chest was performed using the standard protocol during bolus administration of intravenous contrast. Multiplanar CT image reconstructions and MIPs were obtained to evaluate the vascular anatomy. CONTRAST: OMNIPAQUE IOHEXOL 350 MG/ML SOLN COMPARISON: Multiple exams, including 11/06/2014 hand 08/21/2013. FINDINGS: Mediastinum/Nodes: No filling defect is identified in the pulmonary arterial tree to suggest pulmonary embolus. Coronary, aortic arch, and branch vessel atherosclerotic vascular disease. Moderate cardiomegaly. Interventricular septum thickness 2.1 cm compatible with left ventricular hypertrophy. Scattered mild mediastinal adenopathy. Index prevascular node 1.2 cm, image 37 series 4. Index right lower paratracheal node 1.7 cm, image 33 series 4. Lungs/Pleura: Chronic peripheral coarse interstitial accentuation compatible with fibrosis. Superimposed ground-glass and airspace opacities with secondary pulmonary lobular interstitial accentuation and scattered ground-glass opacities, favoring acute pulmonary edema and less likely to be due to atypical infectious process. Small bilateral pleural effusions. Upper abdomen: There is some reflux of contrast medium into the hepatic veins. Several small hypodense lesions in the lateral segment left hepatic lobe,, similar to prior exam. Musculoskeletal: 6 mm of anterolisthesis at C7-T1, not changed from 08/31/2013. 5-10% superior endplate compression fracture at the T5 vertebral level, image 93 series 10, new compared to the CT scan from 08/31/2013. A all Review of the MIP images confirms the above findings. IMPRESSION: 1. No filling defect is identified in the pulmonary arterial tree to suggest pulmonary embolus. 2. Moderate cardiomegaly with acute pulmonary edema superimposed on chronic peripheral  fibrosis/usual interstitial pneumonia. 3. Left ventricular hypertrophy. 4. Scattered mild mediastinal adenopathy, likely due to passive congestion. 5. 5-10% superior endplate compression fracture at the T5 vertebral level is new compared to 08/31/13 and probably subacute. 6. Small bilateral pleural effusions. 7. Several small hypodense lesions in the lateral segment left hepatic lobe, similar to prior CT chest. Electronically Signed By: Gaylyn Rong M.D. On: 11/06/2014 17:20   Dg Chest Port 1 View  11/12/2014 CLINICAL DATA: Acute respiratory failure EXAM: PORTABLE CHEST 1 VIEW COMPARISON: 11/11/2014 FINDINGS: There is an endotracheal tube with the tip 2.9 cm above the carina. There is a right IJ central venous catheter with the tip projecting over the cavoatrial junction. There is a nasogastric tube coursing below the diaphragm. There are bilateral trace pleural effusions. There is bilateral interstitial and alveolar airspace opacity. The heart mediastinum are stable. The osseous structures are unremarkable. IMPRESSION: 1. Bilateral interstitial and alveolar airspace opacities with trace bilateral pleural effusions which may reflect pulmonary edema versus multilobar pneumonia versus ARDS. Electronically Signed By: Elige Ko On: 11/12/2014 10:03   Dg Chest Port 1 View  11/11/2014 CLINICAL DATA: Acute respiratory failure, hypertension, former smoker, pulmonary fibrosis, atrial fibrillation, chronic diastolic CHF EXAM: PORTABLE CHEST 1 VIEW COMPARISON: Portable exam 0443 hours compared to 11/09/2014 FINDINGS: Tip of endotracheal tube projects 4.3 cm above carina. Nasogastric tube extends into stomach. RIGHT jugular central venous catheter tip projects over cavoatrial junction. Enlargement of cardiac silhouette with pulmonary vascular congestion. Persistent severe diffuse interstitial and airspace infiltrates, unchanged. No definite pleural effusion or pneumothorax. IMPRESSION: Persistent  severe diffuse BILATERAL pulmonary infiltrates. This may represent pulmonary fibrosis with superimposed edema or infection. Enlargement of cardiac silhouette with pulmonary vascular congestion. Electronically Signed By: Ulyses Southward M.D. On: 11/11/2014 07:42  Dg Chest Port 1 View  11/09/2014 CLINICAL DATA: Followup respiratory distress/ pulmonary edema. EXAM: PORTABLE CHEST 1 VIEW COMPARISON: 11/09/2014 at 0507 hours FINDINGS: The endotracheal to is 4 cm above the carina. The right IJ catheter is stable. The NG tube is stable. Persistent diffuse interstitial and airspace process in the lungs. No definite pleural effusions. IMPRESSION: Stable support apparatus. Persistent diffuse interstitial and airspace process. Electronically Signed By: Rudie Meyer M.D. On: 11/09/2014 12:30   Dg Chest Port 1 View  11/09/2014 CLINICAL DATA: Respiratory failure EXAM: PORTABLE CHEST 1 VIEW COMPARISON: 11/07/2014 chest radiograph FINDINGS: Endotracheal tube tip is 4.7 cm above the carina. Right internal jugular central venous catheter terminates in the lower third of the superior vena cava. Enteric tube terminates in the proximal stomach Stable cardiomediastinal silhouette with mild cardiomegaly. No pneumothorax. No pleural effusion. There is severe hazy and distorted reticular opacities throughout both lungs, not appreciably changed. IMPRESSION: 1. Well-positioned lines and tubes. 2. Stable severe hazy and reticular opacities throughout both lungs, in keeping with underlying advanced interstitial lung disease, cannot exclude superimposed pulmonary edema given the mild cardiomegaly and hazy opacities. Electronically Signed By: Delbert Phenix M.D. On: 11/09/2014 07:13   Portable Chest Xray  11/07/2014 CLINICAL DATA: Endotracheal tube placement, central line placement EXAM: PORTABLE CHEST 1 VIEW COMPARISON: 11/06/2014 FINDINGS: Borderline cardiomegaly. NG tube in place. Endotracheal tube in place with  tip 3.9 cm above the carina. No pneumothorax. Right IJ central line with tip in SVC right atrium junction. Again noted patchy interstitial prominence bilateral probable pulmonary edema superimposed on chronic interstitial lung disease. IMPRESSION: NG tube in place. Endotracheal tube in place. Right IJ central line with tip in SVC right atrium junction. No pneumothorax. Again noted patchy bilateral interstitial prominence. Electronically Signed By: Natasha Mead M.D. On: 11/07/2014 18:05   Dg Abd Portable 1v  11/07/2014 CLINICAL DATA: OG tube placement. EXAM: PORTABLE ABDOMEN - 1 VIEW COMPARISON: None. FINDINGS: Nasogastric terminates in the stomach. Bowel gas pattern is unremarkable. Incidental imaging of the lower chest shows coarsening of the pulmonary markings. IMPRESSION: Nasogastric tube terminates in the stomach. Electronically Signed By: Leanna Battles M.D. On: 11/07/2014 18:03     Medical Consultants:  Cardiology  PCT  Other Consultants:  PT/OT/SLP     Discharge Exam: Filed Vitals:   2014-12-06 0600  BP: 141/99  Pulse: 105  Temp: 98.8 F (37.1 C)  Resp: 20   Filed Vitals:   11/16/14 2240 12-06-2014 0225 2014-12-06 0600 December 06, 2014 0700  BP:   141/99   Pulse: 104 111 105   Temp:   98.8 F (37.1 C)   TempSrc:   Axillary   Resp: Height:      Weight:      SpO2: 94% 93% 93% 94%    General: Pt is NAD Cardiovascular: Regular rate and rhythm, no rubs, no gallops Respiratory: Clear to auscultation bilaterally, crackles at bases  Abdominal: Soft, non tender, non distended  Discharge Instructions     Medication List    STOP taking these medications        busPIRone 5 MG tablet  Commonly known as:  BUSPAR     DAILY VITE Tabs     dextromethorphan-guaiFENesin 30-600 MG 12hr tablet  Commonly known as:  MUCINEX DM     digoxin 0.125 MG tablet  Commonly known as:  LANOXIN     diltiazem 300 MG 24 hr capsule  Commonly known as:  CARDIZEM CD      famotidine  20 MG tablet  Commonly known as:  PEPCID     gabapentin 300 MG capsule  Commonly known as:  NEURONTIN     leflunomide 20 MG tablet  Commonly known as:  ARAVA     Melatonin 5 MG Caps     memantine 10 MG tablet  Commonly known as:  NAMENDA     NAMENDA XR 14 MG Cp24 24 hr capsule  Generic drug:  memantine     Oyster Shell 500 MG Tabs     pantoprazole 40 MG tablet  Commonly known as:  PROTONIX     predniSONE 10 MG tablet  Commonly known as:  DELTASONE     psyllium 0.52 G capsule  Commonly known as:  REGULOID     QUEtiapine 25 MG tablet  Commonly known as:  SEROQUEL     rivaroxaban 20 MG Tabs tablet  Commonly known as:  XARELTO     sertraline 50 MG tablet  Commonly known as:  ZOLOFT     TUCKS MEDICATED COOLING 50 % Pads     Vitamin D 2000 UNITS Caps      TAKE these medications        furosemide 40 MG tablet  Commonly known as:  LASIX  Take 40 mg by mouth 2 (two) times daily.     levalbuterol 1.25 MG/0.5ML nebulizer solution  Commonly known as:  XOPENEX  Take 1.25 mg by nebulization every 6 (six) hours as needed for wheezing or shortness of breath.     LORazepam 0.5 MG tablet  Commonly known as:  ATIVAN  Take 1 tablet (0.5 mg total) by mouth every 6 (six) hours as needed for anxiety.     morphine 2 MG/ML injection  Inject 1-2 mLs (2-4 mg total) into the vein every hour as needed (or SOB.  If given 3 hours in a row or significant distress, call MD regarding possible morphine gtt).     morphine CONCENTRATE 10 MG/0.5ML Soln concentrated solution  Take 0.5 mLs (10 mg total) by mouth every 2 (two) hours as needed for moderate pain, severe pain or shortness of breath.     OXYGEN  Inhale into the lungs. 2L at bedtime     scopolamine 1 MG/3DAYS  Commonly known as:  TRANSDERM-SCOP  Place 1 patch (1.5 mg total) onto the skin every 3 (three) days.           The results of significant diagnostics from this hospitalization (including imaging,  microbiology, ancillary and laboratory) are listed below for reference.     Microbiology: Recent Results (from the past 240 hour(s))  Culture, respiratory (NON-Expectorated)     Status: None   Collection Time: 11/11/14 12:40 PM  Result Value Ref Range Status   Specimen Description TRACHEAL ASPIRATE  Final   Special Requests NONE  Final   Gram Stain   Final    NO WBC SEEN RARE SQUAMOUS EPITHELIAL CELLS PRESENT NO ORGANISMS SEEN Performed at [facility]    Culture   Final    Non-Pathogenic Oropharyngeal-type Flora Isolated. Performed at [facility]    Report Status 11/13/2014 FINAL  Final  C difficile quick scan w PCR reflex     Status: Abnormal   Collection Time: 11/11/14  1:22 PM  Result Value Ref Range Status   C Diff antigen POSITIVE (A) NEGATIVE Final   C Diff toxin POSITIVE (A) NEGATIVE Final   C Diff interpretation Positive for toxigenic C. difficile  Final    Comment:  CRITICAL RESULT CALLED TO, READ BACK BY AND VERIFIED WITH: A ARNOLD AT 1518 ON 10.23.2016 BY NBROOKS      Labs: Basic Metabolic Panel:  Recent Labs Lab 11/11/14 0455 11/12/14 0530 11/13/14 0430  NA 147* 150* 151*  K 4.2 4.1 3.9  CL 108 109 110  CO2 32 33* 34*  GLUCOSE 144* 124* 140*  BUN 76* 71* 75*  CREATININE 1.42* 1.47* 1.51*  CALCIUM 8.6* 8.4* 8.3*  MG 2.8* 2.6* 2.5*  PHOS 3.0 3.3 3.4   CBC:  Recent Labs Lab 11/11/14 0455 11/12/14 0530 11/13/14 0430  WBC 13.5* 16.9* 14.5*  HGB 12.1* 13.0 12.7*  HCT 38.0* 41.8 40.2  MCV 101.3* 102.5* 102.3*  PLT 221 231 211    BNP (last 3 results)  Recent Labs  02/21/14 1405 10/08/14 1937 11/06/14 1526  BNP 178.7* 206.1* 240.2*    ProBNP (last 3 results)  Recent Labs  05/24/14 1631  PROBNP 223.0*    CBG:  Recent Labs Lab 11/15/14 0753 11/15/14 1215 11/15/14 2019 11/16/14 0022 11/16/14 0404  GLUCAP 125* 138* 126* 112* 112*     SIGNED: Time coordinating discharge: 30 minutes  MAGICK-Sayf Kerner,  MD  [facility] 11-24-2014, 8:32 AM Pager [phone]  If 7PM-7AM, please contact night-coverage [email or web address] Password [email or web address]

## 2014-11-17 NOTE — Clinical Social Work Placement (Signed)
   CLINICAL SOCIAL WORK PLACEMENT  NOTE  Date:  11-25-14  Patient Details  Name: [name] MRN: [id number] Date of Birth: [birth year]  Clinical Social Work is seeking post-discharge placement for this patient at the   level of care (*CSW will initial, date and re-position this form in  chart as items are completed):      Patient/family provided with [facility] Clinical Social Work Department's list of facilities offering this level of care within the geographic area requested by the patient (or if unable, by the patient's family).      Patient/family informed of their freedom to choose among providers that offer the needed level of care, that participate in Medicare, Medicaid or managed care program needed by the patient, have an available bed and are willing to accept the patient.      Patient/family informed of [place]'s ownership interest in [facility] and [facility], as well as of the fact that they are under no obligation to receive care at these facilities.  PASRR submitted to EDS on       PASRR number received on       Existing PASRR number confirmed on       FL2 transmitted to all facilities in geographic area requested by pt/family on       FL2 transmitted to all facilities within larger geographic area on       Patient informed that his/her managed care company has contracts with or will negotiate with certain facilities, including the following:            Patient/family informed of bed offers received.  Patient chooses bed at  [facility]     Physician recommends and patient chooses bed at      Patient to be transferred to  [facility] on  .11-25-14  Patient to be transferred to facility by   ambulance    Patient family notified on   11/25/2014 of transfer.  Name of family member notified:   Wanda/daughter     PHYSICIAN       Additional Comment:    _______________________________________________ Annetta Maw,  LCSW 11/25/2014, 9:55 AM

## 2014-11-17 NOTE — Clinical Social Work Note (Signed)
Pt has a bed at [facility] and all paperwork has been signed by the family. Per RN MD states pt is ready for discharge.  Pt will transfer to [facility] place once discharge summary is completed.  Levada Dy at [employer] is calling family as she spoke with them last night and agreed that she would call after palliative met with pt this morning. CSW will arrange for transport and provide RN with discharge packet when completed   .Dede Query, LCSW [facility] Clinical Social Worker - Weekend Coverage cell #: [phone]

## 2014-11-17 NOTE — Progress Notes (Signed)
Pt left at this time with EMS headed to [facility]. Family aware of transfer. Writer gave report to "Nettie Elm" at [facility] approximately 1025 this morning.

## 2014-11-19 ENCOUNTER — Telehealth: Payer: Self-pay | Admitting: *Deleted

## 2014-11-19 NOTE — Telephone Encounter (Signed)
Pt was on TCM list D/c 2014/12/05 dx Acute Hypoxemic Respiratory Infection. Pt is d/c to [facility]...Raechel Chute

## 2014-11-20 DEATH — deceased

## 2015-01-08 NOTE — Telephone Encounter (Signed)
Error

## 2015-04-20 ENCOUNTER — Encounter: Payer: Self-pay | Admitting: Student

## 2015-09-19 ENCOUNTER — Encounter: Payer: Self-pay | Admitting: Student

## 2016-03-09 IMAGING — CR DG CHEST 2V
2 series · 2 of 2 positions shown · non-contrast
Comparison: Multiple exams, including 08/31/2013

CLINICAL DATA: Congestion and cough. COPD. Confusion. Hypertension.

EXAM:
CHEST  2 VIEW

[w chest lat]
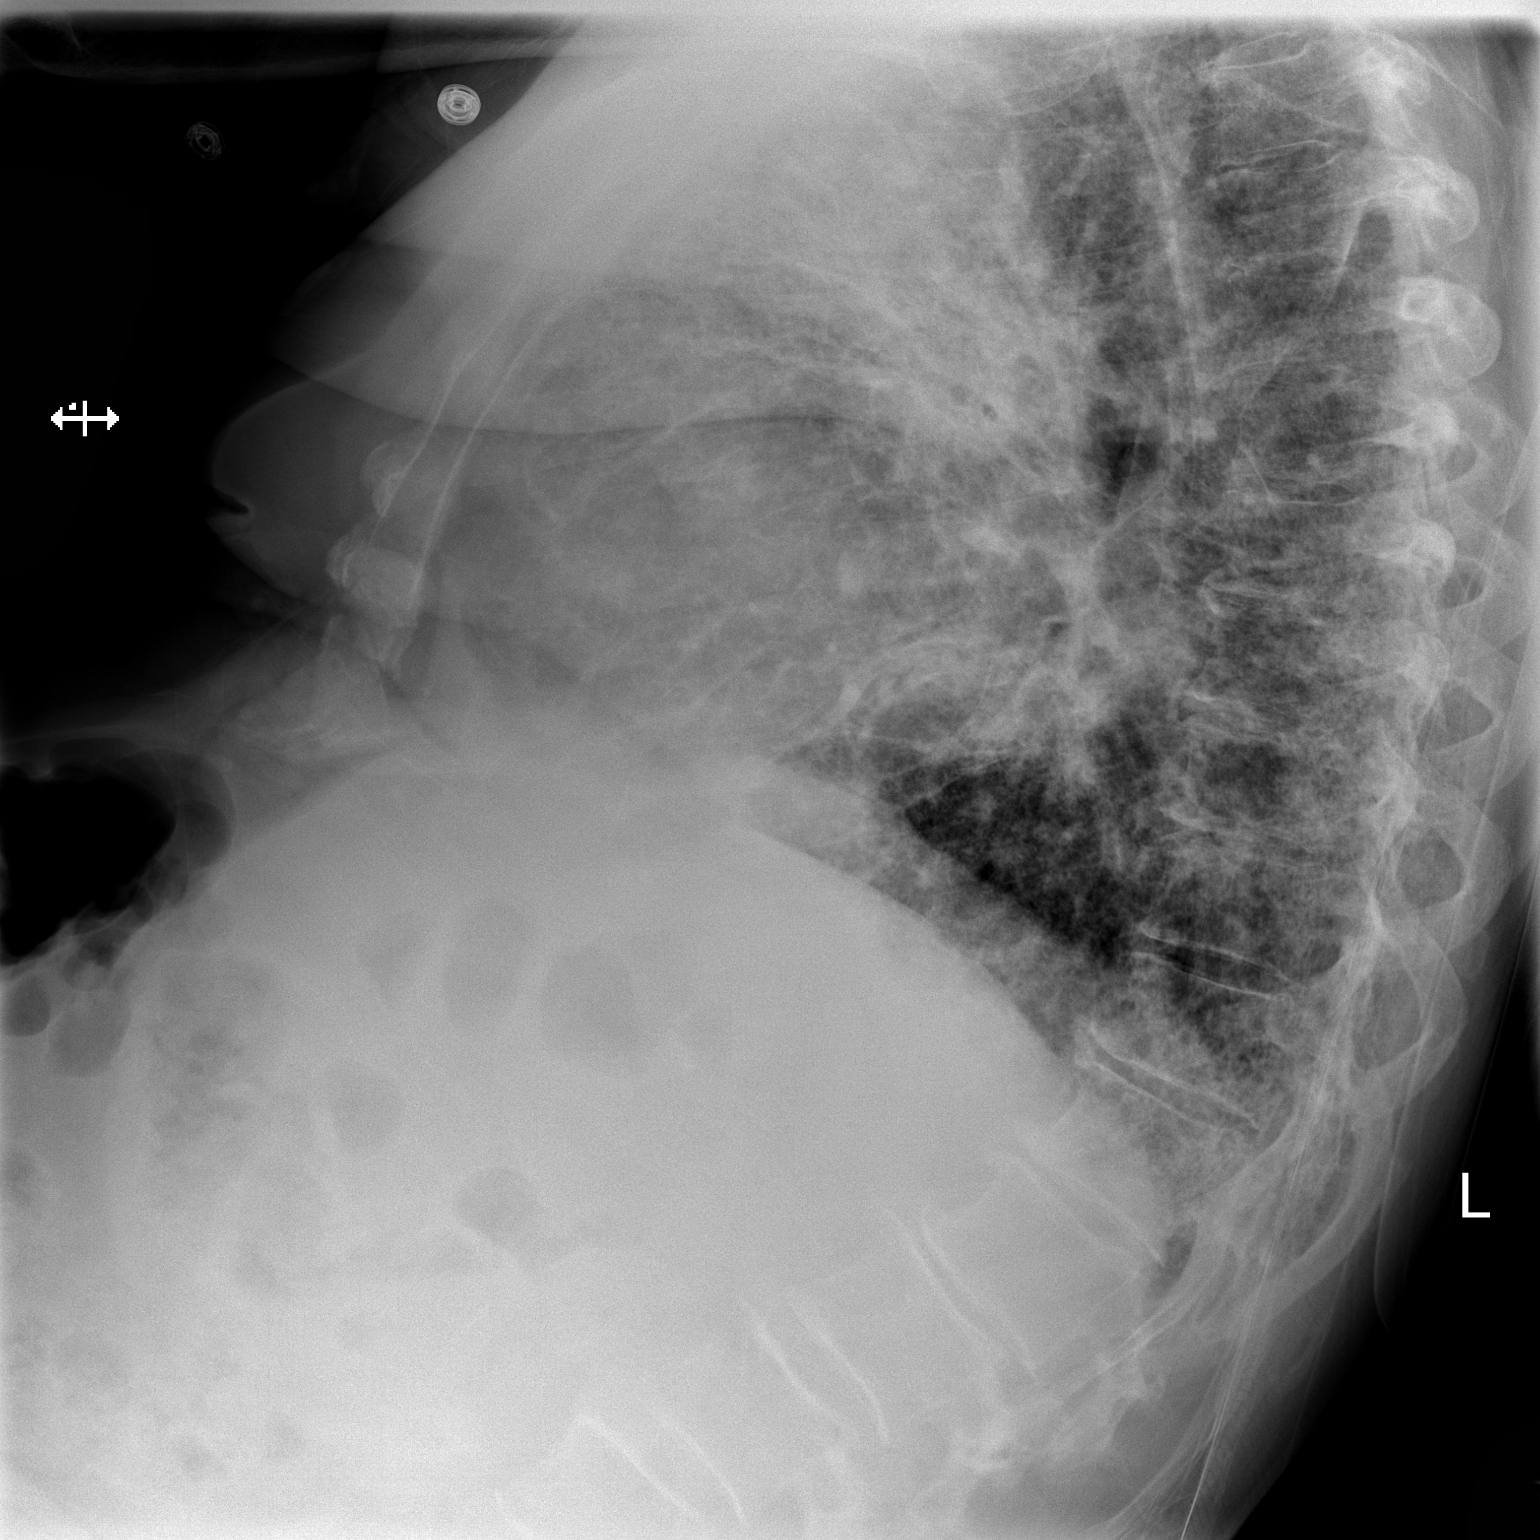

[x chest ap]
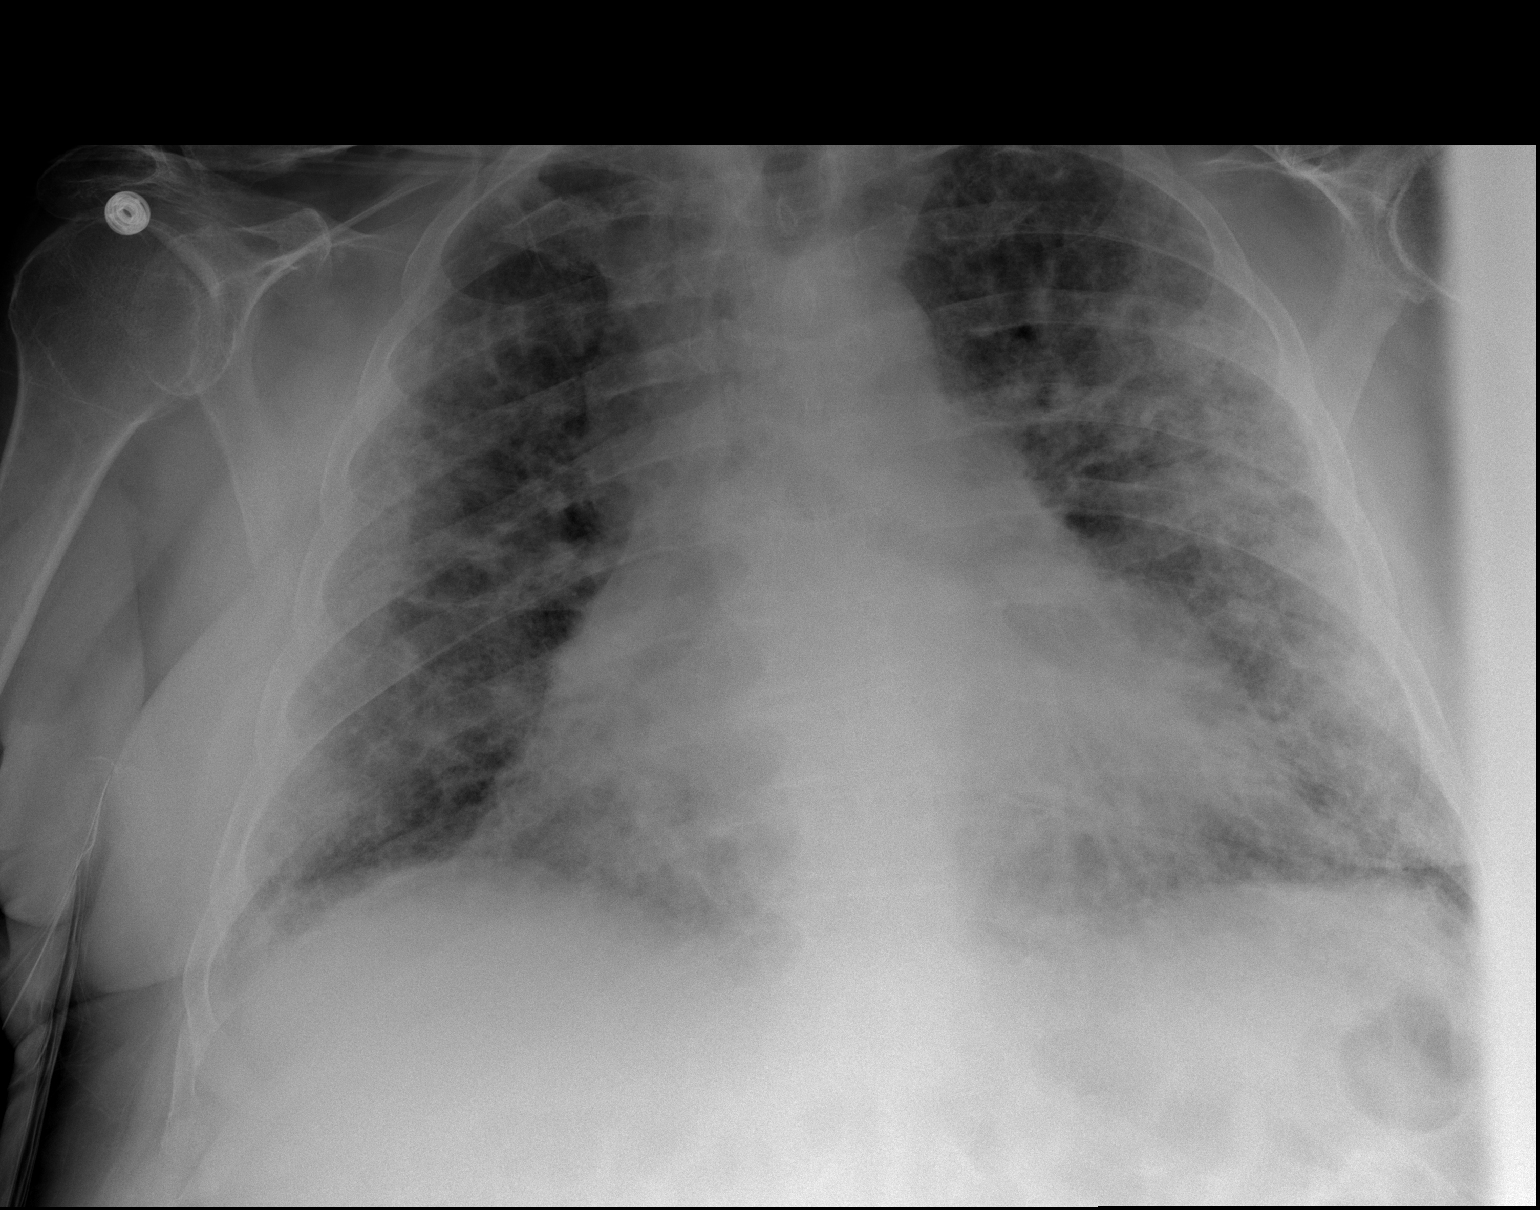

[2 of 2 positions shown; findings below may reference images not displayed]

FINDINGS: Abnormal interstitial and patchy airspace opacities throughout the
lungs likely due to a combination of usual interstitial pneumonitis
and superimposed indistinct airspace processes especially in the
left lung. Cardiomegaly. Thoracic spondylosis. No pleural effusion
observed. Lumbar spondylosis and degenerative disc disease.
IMPRESSION: 1. We observe a combination of the patient's underlying fairly
severe chronic interstitial lung disease (attributed to usual
interstitial pneumonitis on prior high-resolution CT workup) with
superimposed airspace opacities especially peripherally in the left
lung which could represent superimposed pneumonia or asymmetric
edema.
2.  Enlargement of the cardiopericardial silhouette.

## 2016-06-17 IMAGING — CR DG CHEST 1V PORT
1 series · 1 of 1 positions shown · non-contrast
Comparison: February 21, 2014 and June 14, 2013

CLINICAL DATA: Congestive heart failure

EXAM:
PORTABLE CHEST - 1 VIEW

[AP]
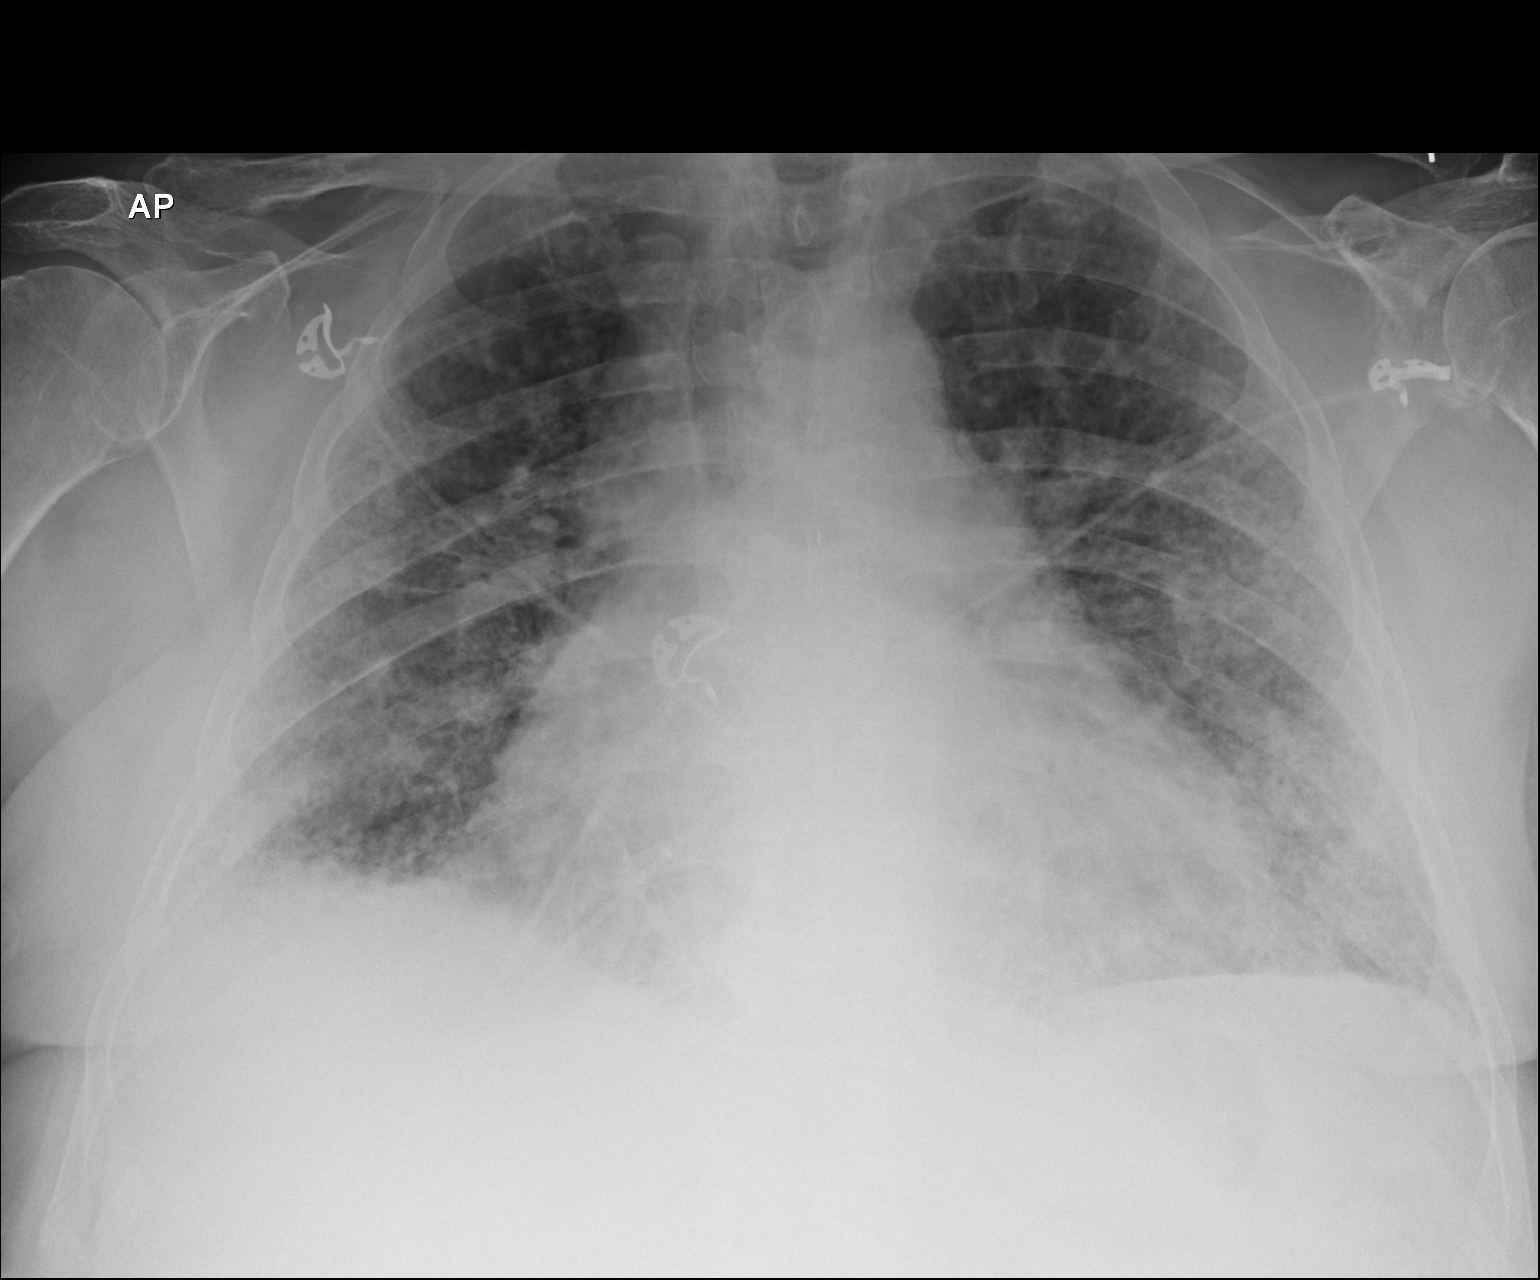

[1 of 1 positions shown; findings below may reference images not displayed]

FINDINGS: There is underlying chronic interstitial fibrotic type change. The
superimposed patchy alveolar opacities noted 2 days prior have
nearly completely cleared. There is no new opacity on either side.
Heart is enlarged with evidence of pulmonary venous hypertension. No
adenopathy.
IMPRESSION: Most of the patchy alveolar edema noted 2 days prior has cleared.
Most of the opacity present currently is due to chronic interstitial
fibrosis, although mild a superimposed edema is felt to remain. No
new opacity. Stable cardiomegaly. Pulmonary venous hypertension
remains as well.

## 2016-09-15 IMAGING — CR DG CHEST 2V
2 series · 2 of 2 positions shown · non-contrast
Comparison: 02/23/2014, 02/20/2014, 06/14/2013, 09/21/2012.

CLINICAL DATA: Shortness of breath.  Pulmonary fibrosis.

EXAM:
CHEST  2 VIEW

[view not recorded (1 of 2)]
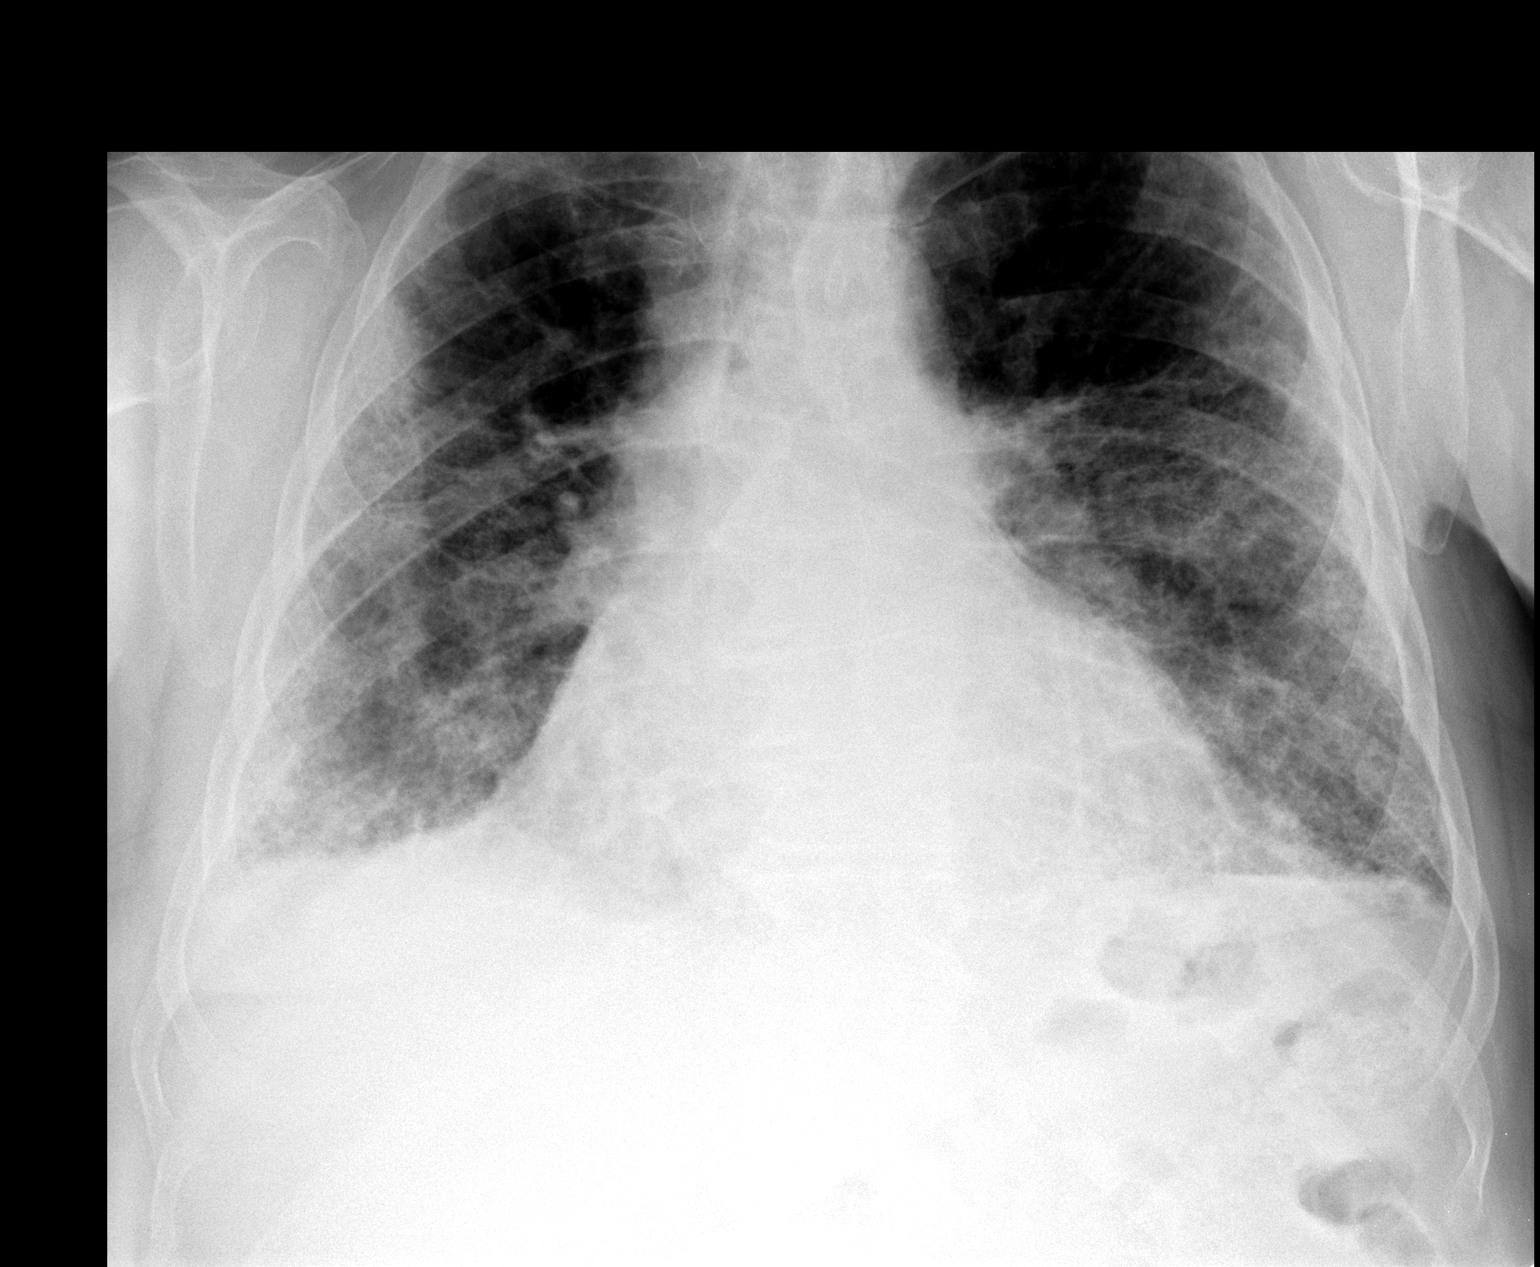

[view not recorded (2 of 2)]
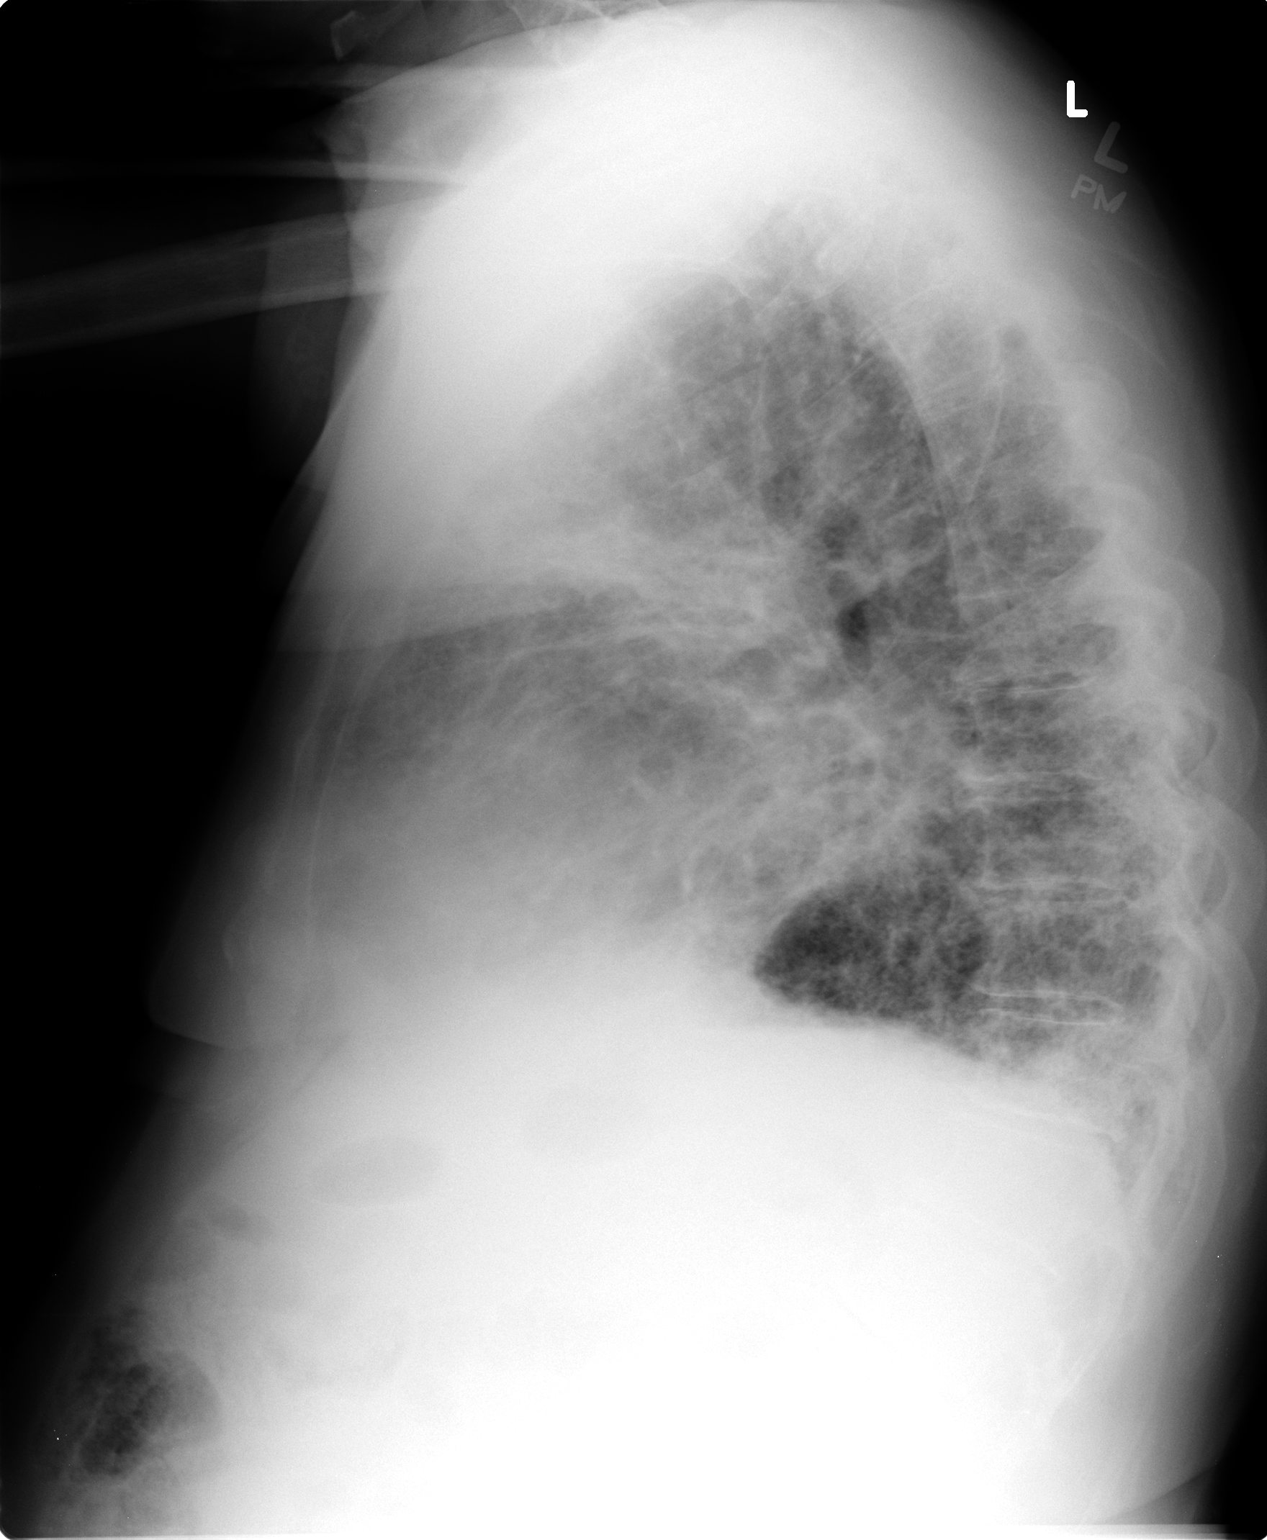

[2 of 2 positions shown; findings below may reference images not displayed]

FINDINGS: Mediastinum and hilar structures are normal. Stable cardiomegaly
with normal pulmonary vascularity. Stable diffuse interstitial
prominence consistent with known pulmonary fibrosis. No evidence of
progressive infiltrate. No pleural effusion or pneumothorax.
IMPRESSION: 1. Stable chest with stable bilateral pulmonary interstitial
prominence noted consistent with known pulmonary interstitial
fibrosis. No evidence of progressive or recurrent infiltrate.

2. Stable cardiomegaly.
